# Patient Record
Sex: Male | Born: 1955 | Race: White | Hispanic: No | Marital: Married | Smoking: Former smoker
Health system: Southern US, Community
[De-identification: ages and names within clinical notes are randomized; demographics above are authoritative.]

## PROBLEM LIST (undated history)

## (undated) DIAGNOSIS — I10 Essential (primary) hypertension: Secondary | ICD-10-CM

## (undated) DIAGNOSIS — I639 Cerebral infarction, unspecified: Secondary | ICD-10-CM

## (undated) DIAGNOSIS — E7800 Pure hypercholesterolemia, unspecified: Secondary | ICD-10-CM

## (undated) DIAGNOSIS — D759 Disease of blood and blood-forming organs, unspecified: Secondary | ICD-10-CM

## (undated) HISTORY — PX: ELBOW SURGERY: SHX618

---

## 2016-07-14 ENCOUNTER — Encounter: Payer: Self-pay | Admitting: Emergency Medicine

## 2016-07-14 ENCOUNTER — Emergency Department: Payer: 59

## 2016-07-14 ENCOUNTER — Inpatient Hospital Stay
Admission: EM | Admit: 2016-07-14 | Discharge: 2016-07-16 | DRG: 065 | Disposition: A | Discharge status: Rehabilitation Facility | Payer: 59 | Attending: Internal Medicine | Admitting: Internal Medicine

## 2016-07-14 DIAGNOSIS — R531 Weakness: Secondary | ICD-10-CM

## 2016-07-14 DIAGNOSIS — G458 Other transient cerebral ischemic attacks and related syndromes: Secondary | ICD-10-CM

## 2016-07-14 DIAGNOSIS — I639 Cerebral infarction, unspecified: Principal | ICD-10-CM | POA: Diagnosis present

## 2016-07-14 DIAGNOSIS — R208 Other disturbances of skin sensation: Secondary | ICD-10-CM | POA: Diagnosis not present

## 2016-07-14 DIAGNOSIS — Z823 Family history of stroke: Secondary | ICD-10-CM

## 2016-07-14 DIAGNOSIS — I63311 Cerebral infarction due to thrombosis of right middle cerebral artery: Secondary | ICD-10-CM | POA: Diagnosis not present

## 2016-07-14 DIAGNOSIS — I10 Essential (primary) hypertension: Secondary | ICD-10-CM

## 2016-07-14 DIAGNOSIS — R200 Anesthesia of skin: Secondary | ICD-10-CM

## 2016-07-14 DIAGNOSIS — Z87891 Personal history of nicotine dependence: Secondary | ICD-10-CM

## 2016-07-14 DIAGNOSIS — E785 Hyperlipidemia, unspecified: Secondary | ICD-10-CM | POA: Diagnosis present

## 2016-07-14 DIAGNOSIS — K219 Gastro-esophageal reflux disease without esophagitis: Secondary | ICD-10-CM | POA: Diagnosis present

## 2016-07-14 DIAGNOSIS — Z888 Allergy status to other drugs, medicaments and biological substances status: Secondary | ICD-10-CM

## 2016-07-14 DIAGNOSIS — Z7982 Long term (current) use of aspirin: Secondary | ICD-10-CM

## 2016-07-14 DIAGNOSIS — G459 Transient cerebral ischemic attack, unspecified: Secondary | ICD-10-CM

## 2016-07-14 DIAGNOSIS — G8194 Hemiplegia, unspecified affecting left nondominant side: Secondary | ICD-10-CM | POA: Diagnosis present

## 2016-07-14 DIAGNOSIS — R29810 Facial weakness: Secondary | ICD-10-CM | POA: Diagnosis present

## 2016-07-14 DIAGNOSIS — Z79899 Other long term (current) drug therapy: Secondary | ICD-10-CM

## 2016-07-14 HISTORY — DX: Disease of blood and blood-forming organs, unspecified: D75.9

## 2016-07-14 HISTORY — DX: Essential (primary) hypertension: I10

## 2016-07-14 LAB — CBC WITH DIFFERENTIAL/PLATELET
BASOS ABS: 0.1 10*3/uL (ref 0–0.1)
BASOS PCT: 1 %
EOS ABS: 0.2 10*3/uL (ref 0–0.7)
EOS PCT: 4 %
HEMATOCRIT: 42.1 % (ref 40.0–52.0)
Hemoglobin: 14.7 g/dL (ref 13.0–18.0)
Lymphocytes Relative: 33 %
Lymphs Abs: 2 10*3/uL (ref 1.0–3.6)
MCH: 30.4 pg (ref 26.0–34.0)
MCHC: 34.9 g/dL (ref 32.0–36.0)
MCV: 87.2 fL (ref 80.0–100.0)
MONO ABS: 0.5 10*3/uL (ref 0.2–1.0)
MONOS PCT: 8 %
NEUTROS ABS: 3.2 10*3/uL (ref 1.4–6.5)
Neutrophils Relative %: 54 %
PLATELETS: 233 10*3/uL (ref 150–440)
RBC: 4.82 MIL/uL (ref 4.40–5.90)
RDW: 13.6 % (ref 11.5–14.5)
WBC: 5.9 10*3/uL (ref 3.8–10.6)

## 2016-07-14 LAB — URINALYSIS COMPLETE WITH MICROSCOPIC (ARMC ONLY)
BILIRUBIN URINE: NEGATIVE
Bacteria, UA: NONE SEEN
Glucose, UA: NEGATIVE mg/dL
Hgb urine dipstick: NEGATIVE
KETONES UR: NEGATIVE mg/dL
LEUKOCYTES UA: NEGATIVE
NITRITE: NEGATIVE
PH: 6 (ref 5.0–8.0)
Protein, ur: NEGATIVE mg/dL
SQUAMOUS EPITHELIAL / LPF: NONE SEEN
Specific Gravity, Urine: 1.009 (ref 1.005–1.030)

## 2016-07-14 LAB — COMPREHENSIVE METABOLIC PANEL
ALBUMIN: 4.5 g/dL (ref 3.5–5.0)
ALT: 23 U/L (ref 17–63)
ANION GAP: 3 — AB (ref 5–15)
AST: 29 U/L (ref 15–41)
Alkaline Phosphatase: 56 U/L (ref 38–126)
BILIRUBIN TOTAL: 0.6 mg/dL (ref 0.3–1.2)
BUN: 18 mg/dL (ref 6–20)
CHLORIDE: 107 mmol/L (ref 101–111)
CO2: 26 mmol/L (ref 22–32)
Calcium: 9.5 mg/dL (ref 8.9–10.3)
Creatinine, Ser: 0.9 mg/dL (ref 0.61–1.24)
GFR calc Af Amer: >60 mL/min (ref >60)
GFR calc non Af Amer: >60 mL/min (ref >60)
GLUCOSE: 101 mg/dL — AB (ref 65–99)
POTASSIUM: 4.3 mmol/L (ref 3.5–5.1)
SODIUM: 136 mmol/L (ref 135–145)
TOTAL PROTEIN: 8.2 g/dL — AB (ref 6.5–8.1)

## 2016-07-14 LAB — TROPONIN I: Troponin I: <0.03 ng/mL (ref <0.03)

## 2016-07-14 MED ORDER — ONDANSETRON HCL 4 MG/2ML IJ SOLN: 4.0000 mg | Freq: Four times a day (QID) | INTRAMUSCULAR | Status: DC | PRN

## 2016-07-14 MED ORDER — DOCUSATE SODIUM 100 MG PO CAPS
100.0000 mg | ORAL_CAPSULE | Freq: Two times a day (BID) | ORAL | Status: DC
  Administered 2016-07-15 – 2016-07-16 (×2): 100 mg via ORAL
  Filled 2016-07-14 (×4): qty 1

## 2016-07-14 MED ORDER — SODIUM CHLORIDE 0.9% FLUSH
3.0000 mL | Freq: Two times a day (BID) | INTRAVENOUS | Status: DC
  Administered 2016-07-14 – 2016-07-16 (×3): 3 mL via INTRAVENOUS

## 2016-07-14 MED ORDER — SODIUM CHLORIDE 0.9 % IV SOLN
INTRAVENOUS | Status: DC
  Administered 2016-07-14 – 2016-07-15 (×2): via INTRAVENOUS

## 2016-07-14 MED ORDER — ASPIRIN 81 MG PO CHEW
324.0000 mg | CHEWABLE_TABLET | Freq: Once | ORAL | Status: AC
  Administered 2016-07-14: 324 mg via ORAL
  Filled 2016-07-14: qty 4

## 2016-07-14 MED ORDER — ONDANSETRON HCL 4 MG PO TABS: 4.0000 mg | ORAL_TABLET | Freq: Four times a day (QID) | ORAL | Status: DC | PRN

## 2016-07-14 MED ORDER — HYDROCODONE-ACETAMINOPHEN 5-325 MG PO TABS: 1.0000 | ORAL_TABLET | ORAL | Status: DC | PRN

## 2016-07-14 MED ORDER — AMLODIPINE BESYLATE 5 MG PO TABS
2.5000 mg | ORAL_TABLET | Freq: Every evening | ORAL | Status: DC
  Administered 2016-07-14 – 2016-07-15 (×2): 2.5 mg via ORAL
  Filled 2016-07-14 (×2): qty 1

## 2016-07-14 MED ORDER — VITAMIN D3 25 MCG (1000 UNIT) PO TABS
1000.0000 [IU] | ORAL_TABLET | ORAL | Status: DC
  Administered 2016-07-15 – 2016-07-16 (×2): 1000 [IU] via ORAL
  Filled 2016-07-14 (×4): qty 1

## 2016-07-14 MED ORDER — ACETAMINOPHEN 325 MG PO TABS
650.0000 mg | ORAL_TABLET | Freq: Four times a day (QID) | ORAL | Status: DC | PRN
Start: 2016-07-14 — End: 2016-07-16
  Administered 2016-07-16: 650 mg via ORAL
  Filled 2016-07-14: qty 2

## 2016-07-14 MED ORDER — PANTOPRAZOLE SODIUM 40 MG PO TBEC
40.0000 mg | DELAYED_RELEASE_TABLET | Freq: Every day | ORAL | Status: DC
  Administered 2016-07-16: 40 mg via ORAL
  Filled 2016-07-14 (×3): qty 1

## 2016-07-14 MED ORDER — CLOPIDOGREL BISULFATE 75 MG PO TABS
75.0000 mg | ORAL_TABLET | Freq: Every day | ORAL | Status: DC
  Administered 2016-07-14 – 2016-07-16 (×3): 75 mg via ORAL
  Filled 2016-07-14 (×3): qty 1

## 2016-07-14 MED ORDER — ASPIRIN EC 81 MG PO TBEC
81.0000 mg | DELAYED_RELEASE_TABLET | Freq: Every evening | ORAL | Status: DC
  Administered 2016-07-15: 81 mg via ORAL
  Filled 2016-07-14: qty 1

## 2016-07-14 MED ORDER — BISACODYL 10 MG RE SUPP: 10.0000 mg | Freq: Every day | RECTAL | Status: DC | PRN

## 2016-07-14 MED ORDER — ACETAMINOPHEN 650 MG RE SUPP: 650.0000 mg | Freq: Four times a day (QID) | RECTAL | Status: DC | PRN

## 2016-07-14 MED ORDER — SODIUM CHLORIDE 0.9 % IV SOLN
1000.0000 mL | Freq: Once | INTRAVENOUS | Status: AC
  Administered 2016-07-14: 1000 mL via INTRAVENOUS

## 2016-07-14 MED ORDER — HEPARIN SODIUM (PORCINE) 5000 UNIT/ML IJ SOLN
5000.0000 [IU] | Freq: Three times a day (TID) | INTRAMUSCULAR | Status: DC
Start: 2016-07-14 — End: 2016-07-15
  Administered 2016-07-14 – 2016-07-15 (×2): 5000 [IU] via SUBCUTANEOUS
  Filled 2016-07-14 (×4): qty 1

## 2016-07-14 NOTE — ED Triage Notes (Signed)
C/O feeling generally weak yesterday and then last night left side became more weak than right around 2000.  When patient woke up this morning about 45 minutes ago, wife noticed left facial droop and patient fell when bending over.

## 2016-07-14 NOTE — Consult Note (Signed)
Referring Physician: Dr Doy Hutching     Chief Complaint: L sided numbness   HPI: Jason Oconnor is an 60 y.o. male with a past medical history significant for HTN admitted with 1 day history of left-sided weakness and left facial numbness. Came to ER where BP was elevated but head CT unremarkable.   Symptoms much improved and pt is close to baseline.   On ASA 81 at home  Date last known well: Date: 07/13/2016 Time last known well: Unable to determine tPA Given: No: 1 day out of symptoms and current NIHSS is 1  Past Medical History:  Diagnosis Date  . Bone marrow disease   . Hypertension     History reviewed. No pertinent surgical history.  History reviewed. No pertinent family history. Social History:  reports that he has quit smoking. He has never used smokeless tobacco. He reports that he drinks alcohol. He reports that he does not use drugs.  Allergies:  Allergies  Allergen Reactions  . Lipitor [Atorvastatin] Other (See Comments)    Blood clots.  . Lisinopril     Other reaction(s): Dizziness  . Losartan     Other reaction(s): Dizziness    Medications: I have reviewed the patient's current medications.  ROS: History obtained from the patient  General ROS: negative for - chills, fatigue, fever, night sweats, weight gain or weight loss Psychological ROS: negative for - behavioral disorder, hallucinations, memory difficulties, mood swings or suicidal ideation Ophthalmic ROS: negative for - blurry vision, double vision, eye pain or loss of vision ENT ROS: negative for - epistaxis, nasal discharge, oral lesions, sore throat, tinnitus or vertigo Allergy and Immunology ROS: negative for - hives or itchy/watery eyes Hematological and Lymphatic ROS: negative for - bleeding problems, bruising or swollen lymph nodes Endocrine ROS: negative for - galactorrhea, hair pattern changes, polydipsia/polyuria or temperature intolerance Respiratory ROS: negative for - cough, hemoptysis, shortness of  breath or wheezing Cardiovascular ROS: negative for - chest pain, dyspnea on exertion, edema or irregular heartbeat Gastrointestinal ROS: negative for - abdominal pain, diarrhea, hematemesis, nausea/vomiting or stool incontinence Genito-Urinary ROS: negative for - dysuria, hematuria, incontinence or urinary frequency/urgency Musculoskeletal ROS: negative for - joint swelling or muscular weakness Neurological ROS: as noted in HPI Dermatological ROS: negative for rash and skin lesion changes  Physical Examination: Blood pressure (!) 137/92, pulse 76, temperature 98.7 F (37.1 C), temperature source Oral, resp. rate 20, height 6' (1.829 m), weight 101.2 kg (223 lb), SpO2 98 %.  HEENT-  Normocephalic, no lesions, without obvious abnormality.  Normal external eye and conjunctiva.  Normal TM's bilaterally.  Normal auditory canals and external ears. Normal external nose, mucus membranes and septum.  Normal pharynx. Cardiovascular- regular rate and rhythm, S1, S2 normal, no murmur, click, rub or gallop, pulses palpable throughout   Lungs- Heart exam - S1, S2 normal, no murmur, no gallop, rate regular Abdomen- soft, non-tender; bowel sounds normal; no masses,  no organomegaly Extremities- less then 2 second capillary refill Lymph-no adenopathy palpable Musculoskeletal-no joint tenderness, deformity or swelling Skin-warm and dry, no hyperpigmentation, vitiligo, or suspicious lesions  Neurological Examination Mental Status: Alert, oriented, thought content appropriate.  Speech fluent without evidence of aphasia.  Able to follow 3 step commands without difficulty. Cranial Nerves: II: Discs flat bilaterally; Visual fields grossly normal, pupils equal, round, reactive to light and accommodation III,IV, VI: ptosis not present, extra-ocular motions intact bilaterally V,VII: smile symmetric, facial light touch sensation normal bilaterally VIII: hearing normal bilaterally IX,X: gag reflex present XI:  bilateral shoulder shrug XII: midline tongue extension Motor: Right : Upper extremity   5/5    Left:     Upper extremity   5/5  Lower extremity   5/5     Lower extremity   5/5 Tone and bulk:normal tone throughout; no atrophy noted Sensory: Pinprick and light touch intact throughout, bilaterally Deep Tendon Reflexes: 2+ and symmetric throughout Plantars: Right: downgoing   Left: downgoing Cerebellar: normal finger-to-nose, normal rapid alternating movements and normal heel-to-shin test Gait: not tested       Laboratory Studies:  Basic Metabolic Panel:  Recent Labs Lab 07/14/16 0935  NA 136  K 4.3  CL 107  CO2 26  GLUCOSE 101*  BUN 18  CREATININE 0.90  CALCIUM 9.5    Liver Function Tests:  Recent Labs Lab 07/14/16 0935  AST 29  ALT 23  ALKPHOS 56  BILITOT 0.6  PROT 8.2*  ALBUMIN 4.5   No results for input(s): LIPASE, AMYLASE in the last 168 hours. No results for input(s): AMMONIA in the last 168 hours.  CBC:  Recent Labs Lab 07/14/16 0935  WBC 5.9  NEUTROABS 3.2  HGB 14.7  HCT 42.1  MCV 87.2  PLT 233    Cardiac Enzymes:  Recent Labs Lab 07/14/16 0935  TROPONINI <0.03    BNP: Invalid input(s): POCBNP  CBG: No results for input(s): GLUCAP in the last 168 hours.  Microbiology: No results found for this or any previous visit.  Coagulation Studies: No results for input(s): LABPROT, INR in the last 72 hours.  Urinalysis:  Recent Labs Lab 07/14/16 1220  COLORURINE STRAW*  LABSPEC 1.009  PHURINE 6.0  GLUCOSEU NEGATIVE  HGBUR NEGATIVE  BILIRUBINUR NEGATIVE  KETONESUR NEGATIVE  PROTEINUR NEGATIVE  NITRITE NEGATIVE  LEUKOCYTESUR NEGATIVE    Lipid Panel: No results found for: CHOL, TRIG, HDL, CHOLHDL, VLDL, LDLCALC  HgbA1C: No results found for: HGBA1C  Urine Drug Screen:  No results found for: LABOPIA, COCAINSCRNUR, LABBENZ, AMPHETMU, THCU, LABBARB  Alcohol Level: No results for input(s): ETH in the last 168 hours.  Other  results: EKG: normal EKG, normal sinus rhythm, unchanged from previous tracings.  Imaging: Ct Head Wo Contrast  Result Date: 07/14/2016 CLINICAL DATA:  Left-sided weakness and left-sided facial droop EXAM: CT HEAD WITHOUT CONTRAST TECHNIQUE: Contiguous axial images were obtained from the base of the skull through the vertex without intravenous contrast. COMPARISON:  None. FINDINGS: Brain: The ventricles are normal in size and configuration. There is no intracranial mass, hemorrhage, extra-axial fluid collection, or midline shift. No acute infarct is evident. Gray-white compartments appear within normal limits. Vascular: There is no hyperdense vessel. There is a small focus of calcification in the right carotid siphon. Skull: The bony calvarium appears intact. Sinuses/Orbits: Visualized paranasal sinuses are clear. There is slight rightward deviation the nasal septum. Orbits appear symmetric bilaterally. Other: Mastoid air cells are clear. IMPRESSION: No intracranial mass, hemorrhage, or focal gray - white compartment lesion. Minimal calcification in the right carotid siphon. Rightward deviation of the nasal septum. Electronically Signed   By: Lowella Grip III M.D.   On: 07/14/2016 09:57   US Carotid Bilateral  Result Date: 07/14/2016 CLINICAL DATA:  Left-sided weakness and facial droop EXAM: BILATERAL CAROTID DUPLEX ULTRASOUND TECHNIQUE: Pearline Cables scale imaging, color Doppler and duplex ultrasound were performed of bilateral carotid and vertebral arteries in the neck. COMPARISON:  None. FINDINGS: Criteria: Quantification of carotid stenosis is based on velocity parameters that correlate the residual internal carotid diameter with NASCET-based stenosis  levels, using the diameter of the distal internal carotid lumen as the denominator for stenosis measurement. The following peak systolic velocity measurements were obtained: RIGHT ICA:  56 cm/sec CCA:  96 cm/sec SYSTOLIC ICA/CCA RATIO:  0.6 DIASTOLIC ICA/CCA  RATIO:  0.8 ECA:  68 cm/sec LEFT ICA:  60 cm/sec CCA:  83 cm/sec SYSTOLIC ICA/CCA RATIO:  0.7 DIASTOLIC ICA/CCA RATIO:  1.0 ECA:  78 cm/sec RIGHT CAROTID ARTERY: Real-time interrogation of the right carotid distribution reveals mild generalized intimal thickening without appreciable vessel narrowing. RIGHT VERTEBRAL ARTERY: Flow in the right vertebral artery is antegrade. LEFT CAROTID ARTERY: Real-time interrogation of the left carotid distribution shows mild generalized intimal thickening. There is mild plaque formation in the mid the distal common carotid artery causing approximately 20% diameter stenosis. There is rather minimal plaque in the bifurcation region on the left. No stenosis approaching 50% diameter is seen. LEFT VERTEBRAL ARTERY: Flow in the left vertebral artery is antegrade. IMPRESSION: Mild plaque on the left. No evidence by either real-time interrogation or waveform criteria of hemodynamically significant obstructive disease in either carotid distribution. Flow in both vertebral arteries is in the anatomic direction. Electronically Signed   By: Lowella Grip III M.D.   On: 07/14/2016 11:59    Assessment: 60 y.o. male male with a past medical history significant for HTN admitted with 1 day history of left-sided weakness and left facial numbness. Came to ER where BP was elevated but head CT unremarkable.   Symptoms much improved and pt is close to baseline.   On ASA 81 at home   Stroke Risk Factors - hypertension  Plan: 1. HgbA1c, fasting lipid panel 2. MRI, MRA  of the brain without contrast 3. PT consult, OT consult, Speech consult 4. Echocardiogram 5. Carotid dopplers 6. Prophylactic therapy-Antiplatelet med: Aspirin - dose 325 daily  7. NPO until RN stroke swallow screen 8. Telemetry monitoring 9. Frequent neuro checks   Leotis Pain  07/14/2016, 3:49 PM

## 2016-07-14 NOTE — ED Notes (Signed)
Informed RN bed ready  1133

## 2016-07-14 NOTE — ED Provider Notes (Signed)
[facility] Emergency Department Provider Note        Time seen: ----------------------------------------- 9:32 AM on 07/14/2016 -----------------------------------------    I have reviewed the triage vital signs and the nursing notes.   HISTORY  Chief Complaint Weakness    HPI Jason Oconnor is a 60 y.o. male who presents to ER for weakness since yesterday evening. Patient states he been complaining of generally being weak and then noticed left-sided weakness last night starting around 2000 hrs. The patient woke up this morning about 45 minutes ago his wife notes he had left facial droop and he fell when bending over. He is complaining of left foot weakness, left facial paresthesias.   Past Medical History:  Diagnosis Date  . Bone marrow disease   . Hypertension     There are no active problems to display for this patient.   No past surgical history on file.  Allergies Review of patient's allergies indicates no known allergies.  Social History Social History  Substance Use Topics  . Smoking status: Never Smoker  . Smokeless tobacco: Never Used  . Alcohol use Yes     Comment: rare    Review of Systems Constitutional: Negative for fever. Cardiovascular: Negative for chest pain. Respiratory: Negative for shortness of breath. Gastrointestinal: Negative for abdominal pain, vomiting and diarrhea. Genitourinary: Negative for dysuria. Musculoskeletal: Negative for back pain. Skin: Negative for rash. Neurological: Negative for headaches, Positive for weakness, numbness  10-point ROS otherwise negative.  ____________________________________________   PHYSICAL EXAM:  VITAL SIGNS: ED Triage Vitals  Enc Vitals Group     BP 07/14/16 0907 (!) 170/106     Pulse Rate 07/14/16 0907 81     Resp 07/14/16 0907 18     Temp 07/14/16 0907 98.5 F (36.9 C)     Temp Source 07/14/16 0907 Oral     SpO2 07/14/16 0907 97 %     Weight 07/14/16 0910 223  lb (101.2 kg)     Height 07/14/16 0910 6' (1.829 m)     Head Circumference --      Peak Flow --      Pain Score 07/14/16 0910 0     Pain Loc --      Pain Edu? --      Excl. in [place]? --     Constitutional: Alert and oriented. Well appearing and in no distress. Eyes: Conjunctivae are normal. PERRL. Normal extraocular movements. ENT   Head: Normocephalic and atraumatic.   Nose: No congestion/rhinnorhea.   Mouth/Throat: Mucous membranes are moist.Uvula is midline   Neck: No stridor. Cardiovascular: Normal rate, regular rhythm. No murmurs, rubs, or gallops. Respiratory: Normal respiratory effort without tachypnea nor retractions. Breath sounds are clear and equal bilaterally. No wheezes/rales/rhonchi. Gastrointestinal: Soft and nontender. Normal bowel sounds Musculoskeletal: Nontender with normal range of motion in all extremities. No lower extremity tenderness nor edema. Neurologic:  Normal speech and language. Patient with left-sided facial drooping noted, forehead is spared. Bilateral upper extremity strength is normal, there is left foot weakness compared to the right, normal sensation except for left facial decrease in sensation. Skin:  Skin is warm, dry and intact. No rash noted. Psychiatric: Mood and affect are normal. Speech and behavior are normal.  ____________________________________________  EKG: Interpreted by me. Sinus rhythm with a rate of 60 bpm, normal PR interval, normal QRS, normal QT interval. Left axis deviation  ____________________________________________  ED COURSE:  Pertinent labs & imaging results that were available during my care of the  patient were reviewed by me and considered in my medical decision making (see chart for details). Clinical Course  Patient presents the ER with left-sided weakness. Unsure of time of onset. Symptoms seem to start at least 12 hours ago. We will obtain CT imaging and  reevaluate.  Procedures ____________________________________________   LABS (pertinent positives/negatives)  Labs Reviewed  CBC WITH DIFFERENTIAL/PLATELET  COMPREHENSIVE METABOLIC PANEL  TROPONIN I  URINALYSIS COMPLETEWITH MICROSCOPIC ([facility] ONLY)   CRITICAL CARE Performed by: Earleen Newport   Total critical care time: 30 minutes  Critical care time was exclusive of separately billable procedures and treating other patients.  Critical care was necessary to treat or prevent imminent or life-threatening deterioration.  Critical care was time spent personally by me on the following activities: development of treatment plan with patient and/or surrogate as well as nursing, discussions with consultants, evaluation of patient's response to treatment, examination of patient, obtaining history from patient or surrogate, ordering and performing treatments and interventions, ordering and review of laboratory studies, ordering and review of radiographic studies, pulse oximetry and re-evaluation of patient's condition.  RADIOLOGY CT of the head IMPRESSION: No intracranial mass, hemorrhage, or focal gray - white compartment lesion.  Minimal calcification in the right carotid siphon.  Rightward deviation of the nasal septum.   ____________________________________________  FINAL ASSESSMENT AND PLAN  CVA  Plan: Patient with labs and imaging as dictated above. Patient clinically with subacute right sided infarct affecting his left. I have given him an adult aspirin, we will order an MRI. Patient has agreed to admission.   Earleen Newport, MD   Note: This dictation was prepared with Dragon dictation. Any transcriptional errors that result from this process are unintentional    Earleen Newport, MD 07/14/16 1034

## 2016-07-14 NOTE — H&P (Signed)
History and Physical    Jason Oconnor [id number] DOB: Oct 16, 1956 DOA: 07/14/2016  Referring physician: Dr. Jimmye Norman PCP: Andi Devon, MD  Specialists: none  Chief Complaint: left-sided weakness  HPI: Jason Oconnor is a 60 y.o. male has a past medical history significant for HTN and "bone marrow disease" now with 12-15 hour hx of left-sided weakness and left facial numbness. Came to ER where BP was elevated but head CT and labs non-diagnostic. Sx's are improving. He is now admitted. Denies smoking, DM, or elevated lipids. No cardiac hx.  Review of Systems: The patient denies anorexia, fever, weight loss,, vision loss, decreased hearing, hoarseness, chest pain, syncope, dyspnea on exertion, peripheral edema, balance deficits, hemoptysis, abdominal pain, melena, hematochezia, severe indigestion/heartburn, hematuria, incontinence, genital sores, muscle weakness, suspicious skin lesions, transient blindness,  depression, unusual weight change, abnormal bleeding, enlarged lymph nodes, angioedema, and breast masses.   Past Medical History:  Diagnosis Date  . Bone marrow disease   . Hypertension    History reviewed. No pertinent surgical history. Social History:  reports that he has never smoked. He has never used smokeless tobacco. He reports that he drinks alcohol. He reports that he does not use drugs.  Allergies  Allergen Reactions  . Lisinopril     Other reaction(s): Dizziness  . Losartan     Other reaction(s): Dizziness    History reviewed. No pertinent family history.  Prior to Admission medications   Medication Sig Start Date End Date Taking? Authorizing Provider  amLODipine (NORVASC) 2.5 MG tablet Take 2.5 mg by mouth every evening. 06/24/16  Yes Historical Provider, MD  aspirin EC 81 MG tablet Take 81 mg by mouth every evening.   Yes Historical Provider, MD  Cholecalciferol (VITAMIN D3) 1000 units CAPS Take 1,000 Units by mouth every morning.   Yes Historical Provider,  MD  fluocinonide (LIDEX) 0.05 % external solution Apply 1 application topically 2 (two) times daily as needed. For eczema. 10/24/15 10/23/16 Yes Historical Provider, MD  omeprazole (PRILOSEC) 20 MG capsule Take 20 mg by mouth daily as needed for heartburn or indigestion. 08/23/15  Yes Historical Provider, MD   Physical Exam: Vitals:   07/14/16 0907 07/14/16 0910 07/14/16 0944 07/14/16 1030  BP: (!) 170/106   (!) 153/95  Pulse: 81   75  Resp: 18   13  Temp: 98.5 F (36.9 C)  98.5 F (36.9 C)   TempSrc: Oral     SpO2: 97%   97%  Weight:  101.2 kg (223 lb)    Height:  6' (1.829 m)       General:  No apparent distress, WDWN, [place]/AT  Eyes: PERRL, EOMI, no scleral icterus, conjunctiva clear  ENT: moist oropharynx without exudate, TM's benign, dentition good  Neck: supple, no lymphadenopathy. No bruits or thyromegaly  Cardiovascular: regular rate without MRG; 2+ peripheral pulses, no JVD, no peripheral edema  Respiratory: CTA biL, good air movement without wheezing, rhonchi or crackled. Respiratory effort normal  Abdomen: soft, non tender to palpation, positive bowel sounds, no guarding, no rebound  Skin: no rashes or lesions  Musculoskeletal: normal bulk and tone, no joint swelling  Psychiatric: normal mood and affect, A&OX3  Neurologic: CN 2-12 grossly intact, Motor strength 5/5 in all 4 groups with symmetric DTR's and non-focal sensory exam  Labs on Admission:  Basic Metabolic Panel:  Recent Labs Lab 07/14/16 0935  NA 136  K 4.3  CL 107  CO2 26  GLUCOSE 101*  BUN 18  CREATININE 0.90  CALCIUM 9.5   Liver Function Tests:  Recent Labs Lab 07/14/16 0935  AST 29  ALT 23  ALKPHOS 56  BILITOT 0.6  PROT 8.2*  ALBUMIN 4.5   No results for input(s): LIPASE, AMYLASE in the last 168 hours. No results for input(s): AMMONIA in the last 168 hours. CBC:  Recent Labs Lab 07/14/16 0935  WBC 5.9  NEUTROABS 3.2  HGB 14.7  HCT 42.1  MCV 87.2  PLT 233   Cardiac  Enzymes:  Recent Labs Lab 07/14/16 0935  TROPONINI <0.03    BNP (last 3 results) No results for input(s): BNP in the last 8760 hours.  ProBNP (last 3 results) No results for input(s): PROBNP in the last 8760 hours.  CBG: No results for input(s): GLUCAP in the last 168 hours.  Radiological Exams on Admission: Ct Head Wo Contrast  Result Date: 07/14/2016 CLINICAL DATA:  Left-sided weakness and left-sided facial droop EXAM: CT HEAD WITHOUT CONTRAST TECHNIQUE: Contiguous axial images were obtained from the base of the skull through the vertex without intravenous contrast. COMPARISON:  None. FINDINGS: Brain: The ventricles are normal in size and configuration. There is no intracranial mass, hemorrhage, extra-axial fluid collection, or midline shift. No acute infarct is evident. Gray-white compartments appear within normal limits. Vascular: There is no hyperdense vessel. There is a small focus of calcification in the right carotid siphon. Skull: The bony calvarium appears intact. Sinuses/Orbits: Visualized paranasal sinuses are clear. There is slight rightward deviation the nasal septum. Orbits appear symmetric bilaterally. Other: Mastoid air cells are clear. IMPRESSION: No intracranial mass, hemorrhage, or focal gray - white compartment lesion. Minimal calcification in the right carotid siphon. Rightward deviation of the nasal septum. Electronically Signed   By: Lowella Grip III M.D.   On: 07/14/2016 09:57    EKG: Independently reviewed.  Assessment/Plan Principal Problem:   TIA (transient ischemic attack) Active Problems:   HTN, goal below 140/80   Left facial numbness   Weakness of left side of body   Will observe on telemetry with SQ Heparin and Plavix. Consult Neuro. Check brain MRI with carotids and echo. Monitor BP closely. Repeat labs in AM. PT to evaluate.  Diet: soft Fluids: NS@75  DVT Prophylaxis: SQ Heparin  Code Status: FULL  Family Communication: yes  Disposition  Plan: home  Time spent: 50 min

## 2016-07-14 NOTE — Plan of Care (Signed)
Problem: Tissue Perfusion: Goal: Cerebral tissue perfusion will improve applicable to all stroke diagnoses  Outcome: Progressing Pt admitted today from the ED. NIH 4. No neuro changes during the shift. Denies pain.

## 2016-07-15 ENCOUNTER — Observation Stay
Admit: 2016-07-15 | Discharge: 2016-07-15 | Disposition: A | Discharge status: Home / Self Care | Payer: 59 | Attending: Internal Medicine | Admitting: Internal Medicine

## 2016-07-15 ENCOUNTER — Inpatient Hospital Stay: Payer: 59

## 2016-07-15 ENCOUNTER — Encounter: Payer: Self-pay | Admitting: Radiology

## 2016-07-15 ENCOUNTER — Observation Stay: Payer: 59

## 2016-07-15 DIAGNOSIS — E785 Hyperlipidemia, unspecified: Secondary | ICD-10-CM | POA: Diagnosis present

## 2016-07-15 DIAGNOSIS — Z87891 Personal history of nicotine dependence: Secondary | ICD-10-CM | POA: Diagnosis not present

## 2016-07-15 DIAGNOSIS — Z7982 Long term (current) use of aspirin: Secondary | ICD-10-CM | POA: Diagnosis not present

## 2016-07-15 DIAGNOSIS — R29810 Facial weakness: Secondary | ICD-10-CM | POA: Diagnosis present

## 2016-07-15 DIAGNOSIS — I10 Essential (primary) hypertension: Secondary | ICD-10-CM | POA: Diagnosis present

## 2016-07-15 DIAGNOSIS — Z823 Family history of stroke: Secondary | ICD-10-CM | POA: Diagnosis not present

## 2016-07-15 DIAGNOSIS — I63311 Cerebral infarction due to thrombosis of right middle cerebral artery: Secondary | ICD-10-CM

## 2016-07-15 DIAGNOSIS — K219 Gastro-esophageal reflux disease without esophagitis: Secondary | ICD-10-CM | POA: Diagnosis present

## 2016-07-15 DIAGNOSIS — Z79899 Other long term (current) drug therapy: Secondary | ICD-10-CM | POA: Diagnosis not present

## 2016-07-15 DIAGNOSIS — Z888 Allergy status to other drugs, medicaments and biological substances status: Secondary | ICD-10-CM | POA: Diagnosis not present

## 2016-07-15 DIAGNOSIS — I639 Cerebral infarction, unspecified: Secondary | ICD-10-CM | POA: Diagnosis present

## 2016-07-15 DIAGNOSIS — G8194 Hemiplegia, unspecified affecting left nondominant side: Secondary | ICD-10-CM | POA: Diagnosis present

## 2016-07-15 LAB — ECHOCARDIOGRAM COMPLETE
Height: 72 in
Weight: 3568 oz

## 2016-07-15 LAB — COMPREHENSIVE METABOLIC PANEL
ALBUMIN: 4 g/dL (ref 3.5–5.0)
ALT: 20 U/L (ref 17–63)
ANION GAP: 6 (ref 5–15)
AST: 22 U/L (ref 15–41)
Alkaline Phosphatase: 49 U/L (ref 38–126)
BILIRUBIN TOTAL: 0.9 mg/dL (ref 0.3–1.2)
BUN: 17 mg/dL (ref 6–20)
CHLORIDE: 109 mmol/L (ref 101–111)
CO2: 23 mmol/L (ref 22–32)
Calcium: 9 mg/dL (ref 8.9–10.3)
Creatinine, Ser: 0.87 mg/dL (ref 0.61–1.24)
GFR calc Af Amer: >60 mL/min (ref >60)
GLUCOSE: 97 mg/dL (ref 65–99)
POTASSIUM: 4 mmol/L (ref 3.5–5.1)
Sodium: 138 mmol/L (ref 135–145)
TOTAL PROTEIN: 7.5 g/dL (ref 6.5–8.1)

## 2016-07-15 LAB — LIPID PANEL
Cholesterol: 205 mg/dL — ABNORMAL HIGH (ref 0–200)
HDL: 39 mg/dL — AB (ref >40)
LDL CALC: 138 mg/dL — AB (ref 0–99)
TRIGLYCERIDES: 139 mg/dL (ref <150)
Total CHOL/HDL Ratio: 5.3 RATIO
VLDL: 28 mg/dL (ref 0–40)

## 2016-07-15 LAB — CBC
HEMATOCRIT: 40.4 % (ref 40.0–52.0)
HEMOGLOBIN: 14 g/dL (ref 13.0–18.0)
MCH: 30.5 pg (ref 26.0–34.0)
MCHC: 34.8 g/dL (ref 32.0–36.0)
MCV: 87.7 fL (ref 80.0–100.0)
Platelets: 214 10*3/uL (ref 150–440)
RBC: 4.6 MIL/uL (ref 4.40–5.90)
RDW: 13.5 % (ref 11.5–14.5)
WBC: 6.4 10*3/uL (ref 3.8–10.6)

## 2016-07-15 MED ORDER — IOPAMIDOL (ISOVUE-370) INJECTION 76%
75.0000 mL | Freq: Once | INTRAVENOUS | Status: AC | PRN
  Administered 2016-07-15: 16:00:00 75 mL via INTRAVENOUS

## 2016-07-15 MED ORDER — ENOXAPARIN SODIUM 40 MG/0.4ML ~~LOC~~ SOLN
40.0000 mg | SUBCUTANEOUS | Status: DC
  Administered 2016-07-15: 40 mg via SUBCUTANEOUS
  Filled 2016-07-15: qty 0.4

## 2016-07-15 NOTE — Evaluation (Signed)
Occupational Therapy Evaluation Patient Details Name: Jason Oconnor MRN: [id number] DOB: 1956/07/25 Today's Date: 07/15/2016    History of Present Illness Pt. is a 60 y.o. male who was admitted to [facility]  with left sided weakness, and facial droop. PMH: include: HTN.   Clinical Impression   Pt. Is a 60 y.o. Male who was admitted to [facility] with left sided weakness, and facial droop. Pt. Presents with LUE weakness, impaired coordination, impaired hand function skills, and decreased functional mobility which hinders his ability to incorporate his LUE and hand into tasks during ADL and IADLs. Pt. Could benefit from skilled OT services for ADL and IADL training, UE there. Ex, neuro-muscular re-ed, and pt./family education in order to return to his PLOF. Pt. And wife were provided with education about using his left hand to assist during ADL tasks, and there. Ex. Pt. Would benefit from follow-up OT services, and would be a good candidate for CIR.    Follow Up Recommendations  CIR    Equipment Recommendations       Recommendations for Other Services Rehab consult     Precautions / Restrictions Precautions Precautions: Fall Restrictions Weight Bearing Restrictions: No                                           ADL Overall ADL's : Needs assistance/impaired Eating/Feeding: Set up (Pt. is able to use his RUE, however requires hand-over hand assist for LUE use.)   Grooming: Set up (Pt. requires hand-over hand assist to use her LUE to assist.)               Lower Body Dressing: Maximal assistance               Functional mobility during ADLs: Minimal assistance General ADL Comments: Pt. has difficulty using his left hand to complete light self care tasks.     Vision     Perception     Praxis      Pertinent Vitals/Pain Pain Assessment: No/denies pain Pain Score: 0-No pain     Hand Dominance     Extremity/Trunk Assessment Upper Extremity  Assessment Upper Extremity Assessment: Generalized weakness (LUE proximal strength 3+/5 for Abduction, flexion, 4-/5 elbow flexion, wrist extension 3/5, thumb flexion/abduction 3-/5, impaired thumb opposition. Grip L: 22#, R: 110#. Intact sensation, proprioceptive awareness.)         Communication Communication Communication: No difficulties   Cognition Arousal/Alertness: Awake/alert Behavior During Therapy: WFL for tasks assessed/performed Overall Cognitive Status: Within Functional Limits for tasks assessed                     General Comments       Exercises   Shoulder Instructions      Home Living Family/patient expects to be discharged to:: Private residence Living Arrangements: Spouse/significant other Available Help at Discharge: Family Type of Home: House Home Access: Stairs to enter Technical brewer of Steps: 1 Entrance Stairs-Rails: None Home Layout: One level           Bathroom Accessibility: Yes   Home Equipment: None          Prior Functioning/Environment Level of Independence: Independent        Comments: Pt was independent with all household and community ambulation. Independent with ADLs.    OT Diagnosis: Generalized weakness   OT Problem List: Decreased strength;Decreased activity tolerance;Decreased coordination;Impaired UE  functional use;Impaired balance (sitting and/or standing);Decreased range of motion   OT Treatment/Interventions:      OT Goals(Current goals can be found in the care plan section) Acute Rehab OT Goals Patient Stated Goal: To return to independence. OT Goal Formulation: With patient Potential to Achieve Goals: Good  OT Frequency:     Barriers to D/C:            Co-evaluation              End of Session    Activity Tolerance: Patient tolerated treatment well Patient left: in chair;with chair alarm set;with family/visitor present   Time: 1120-1150 OT Time Calculation (min): 30  min Charges:  OT General Charges $OT Visit: 1 Procedure OT Evaluation $OT Eval Moderate Complexity: 1 Procedure G-Codes:    Harrel Carina, MS, OTR/L 07/15/2016, 12:17 PM

## 2016-07-15 NOTE — Progress Notes (Signed)
Inpatient Rehabilitation  I received a call from Jeannie Fend, [facility] regarding possible IP Rehab for Mr. Jason Oconnor. PT and OT are recommending IP rehab.   I will reach out to the patient and family to discuss.  Please call if questions.  [place] Admissions Coordinator Cell [phone] Office [phone]

## 2016-07-15 NOTE — Evaluation (Signed)
Physical Therapy Evaluation Patient Details Name: Jason Oconnor MRN: [id number] DOB: 10-Dec-1955 Today's Date: 07/15/2016   History of Present Illness  Pt is a 60 y/o admitted for TIA. Presents to ED with L-sided weakness and facial droop along with fall on 8/20. Head CT unremarkable, awaiting MRI results. PMH includes HTN.  Clinical Impression  Pt is a pleasant and motivated 60 y/o male who presents with generalized weakness and difficulty walking. PLOF: Pt was independent with all household and community ambulation. Independent with ADLs. R UE and LE grossly 5/5. L UE 4-/5, L LE 3+/5. L UE difficulty with [place], secondary to weakness and decreased motor control. Decreased speed and motor coordination on L side with RAMPs and toe taps. Pt requires min assist for bed mobility and transfers and +2 (safety) mod assist for ambulation. Pt relies on R UE and LE for bed mobility and sit/stand transfers. Min assist to lift trunk off of bed with bed mobility due to L UE weakness. Min assist for transfers and verbal cueing for safe technique and to primarily use R LE to increased stability. Unsteadiness noted with sit/stand, min assist for standing balance due to L knee buckling. Significant deficits noted on L side with ambulation with RW; demonstrates L LE foot drag (decreased L hip flexion and L ankle dorsiflexion) and increased L lateral lean (L LE giving way due to weakness), both indicate high risk for falls.. +2 assist for safety due to L LE weakness. Pt will benefit from skilled PT services in order to improve strength, ROM, balance, endurance, functional mobility, and safety with DME use. At this time pt is appropriate for acute inpatient rehab services to address deficits. This entire session was guided, instructed, and directly supervised by Greggory Stallion, DPT.      Follow Up Recommendations CIR    Equipment Recommendations  Rolling walker with 5" wheels    Recommendations for Other Services        Precautions / Restrictions Precautions Precautions: Fall Restrictions Weight Bearing Restrictions: No      Mobility  Bed Mobility Overal bed mobility: Needs Assistance Bed Mobility: Supine to Sit     Supine to sit: Min assist     General bed mobility comments: Pt able to swing LE off of bed. Unable to push through L UE to assist with lifting trunk off of bed due to weakness, min assist. Pt able to scoot to EOB with no assist, primarily using R UE and LE.  Transfers Overall transfer level: Needs assistance Equipment used: None Transfers: Sit to/from Stand Sit to Stand: Min assist         General transfer comment: Min assist for sit/stand, demonstrates safe technique. Unsteadiness noted due to L LE weakness, relies on R LE to maintain standing balance.  Ambulation/Gait Ambulation/Gait assistance: Mod assist;+2 safety/equipment Ambulation Distance (Feet): 3 Feet Assistive device: Rolling walker (2 wheeled) Gait Pattern/deviations: Step-to pattern;Decreased step length - left;Decreased stride length;Decreased dorsiflexion - left   Gait velocity interpretation: Below normal speed for age/gender General Gait Details: Pt requires +2 (for safety) mod assist with ambulation with RW. Decreased L hip flexion and L ankle doriflexion, decreased motor control. Increased L lateral lean due to L LE giving way, mod assist to maintain balance.   Stairs            Wheelchair Mobility    Modified Rankin (Stroke Patients Only)       Balance Overall balance assessment: Needs assistance Sitting-balance support: Feet supported;Bilateral upper extremity supported  Sitting balance-Leahy Scale: Good Sitting balance - Comments: Pt able to maintain sitting balance with B UE support. Min guard with no UE support.   Standing balance support: Bilateral upper extremity supported Standing balance-Leahy Scale: Fair Standing balance comment: Pt requires +2 mod assist for safety to maintain  standing balance due to L LE weakness, giving way easily.                             Pertinent Vitals/Pain Pain Assessment: No/denies pain    Home Living Family/patient expects to be discharged to:: Private residence Living Arrangements: Spouse/significant other Available Help at Discharge: Family Type of Home: House Home Access: Stairs to enter Entrance Stairs-Rails: None Entrance Stairs-Number of Steps: 1 Home Layout: One level Home Equipment: None      Prior Function Level of Independence: Independent         Comments: Pt was independent with all household and community ambulation. Independent with ADLs.     Hand Dominance        Extremity/Trunk Assessment   Upper Extremity Assessment: Generalized weakness (R UE grossly 5/5. L UE grossly 4/5.)           Lower Extremity Assessment: Generalized weakness (R LE grossly 5/5. L LE grossly 3+/5.)         Communication   Communication: No difficulties  Cognition Arousal/Alertness: Awake/alert Behavior During Therapy: WFL for tasks assessed/performed Overall Cognitive Status: Within Functional Limits for tasks assessed                      General Comments      Exercises Other Exercises Other Exercises: Supine ther-ex: L LE quad sets, SLR, and hip abd/add x 10 reps with min assist. Verbal and tactile cueing for proper technique.      Assessment/Plan    PT Assessment Patient needs continued PT services  PT Diagnosis Generalized weakness;Difficulty walking   PT Problem List Decreased strength;Decreased range of motion;Decreased activity tolerance;Decreased balance;Decreased mobility;Decreased coordination;Decreased knowledge of use of DME  PT Treatment Interventions DME instruction;Gait training;Stair training;Functional mobility training;Therapeutic activities;Therapeutic exercise;Balance training;Neuromuscular re-education;Patient/family education   PT Goals (Current goals can be  found in the Care Plan section) Acute Rehab PT Goals Patient Stated Goal: To get back to normal PT Goal Formulation: With patient Time For Goal Achievement: 07/29/16 Potential to Achieve Goals: Good    Frequency 7X/week   Barriers to discharge        Co-evaluation               End of Session Equipment Utilized During Treatment: Gait belt Activity Tolerance: Patient tolerated treatment well Patient left: in chair;with call bell/phone within reach;with chair alarm set;with family/visitor present Nurse Communication: Mobility status    Functional Assessment Tool Used: clinical judgement Functional Limitation: Mobility: Walking and moving around Mobility: Walking and Moving Around Current Status [phone]): At least 40 percent but less than 60 percent impaired, limited or restricted Mobility: Walking and Moving Around Goal Status [phone]): At least 20 percent but less than 40 percent impaired, limited or restricted    Time: [id number] PT Time Calculation (min) (ACUTE ONLY): 25 min   Charges:   PT Evaluation $PT Eval Moderate Complexity: 1 Procedure PT Treatments $Therapeutic Exercise: 8-22 mins   PT G Codes:   PT G-Codes **NOT FOR INPATIENT CLASS** Functional Assessment Tool Used: clinical judgement Functional Limitation: Mobility: Walking and moving around Mobility: Walking and Moving Around Current  Status [phone]): At least 40 percent but less than 60 percent impaired, limited or restricted Mobility: Walking and Moving Around Goal Status [phone]): At least 20 percent but less than 40 percent impaired, limited or restricted    [facility] 07/15/2016, 11:41 AM  Georgina Pillion, SPT [phone]

## 2016-07-15 NOTE — Progress Notes (Signed)
[employer] PROGRESS NOTE  Jason Oconnor [id number] DOB: October 24, 1956 DOA: 07/14/2016 PCP: Andi Devon, MD  HPI/Subjective: Patient with worsening symptoms today than yesterday. He can't move his left foot up or down. He is able to lift his left leg up off the bed though. Incoordination with the left hand and worse in coordination with the left fingers.  Objective: Vitals:   07/15/16 0433 07/15/16 1023  BP: (!) 153/93 (!) 142/89  Pulse: 69 78  Resp: 19 18  Temp: 97.6 F (36.4 C) 98 F (36.7 C)    Filed Weights   07/14/16 0910  Weight: 101.2 kg (223 lb)    ROS: Review of Systems  Constitutional: Negative for chills and fever.  Eyes: Negative for blurred vision.  Respiratory: Negative for cough and shortness of breath.   Cardiovascular: Negative for chest pain.  Gastrointestinal: Negative for abdominal pain, constipation, diarrhea, nausea and vomiting.  Genitourinary: Negative for dysuria.  Musculoskeletal: Negative for joint pain.  Neurological: Positive for focal weakness and weakness. Negative for dizziness and headaches.   Exam: Physical Exam  Constitutional: He is oriented to person, place, and time.  HENT:  Nose: No mucosal edema.  Mouth/Throat: No oropharyngeal exudate or posterior oropharyngeal edema.  Eyes: Conjunctivae, EOM and lids are normal. Pupils are equal, round, and reactive to light.  Neck: No JVD present. Carotid bruit is not present. No edema present. No thyroid mass and no thyromegaly present.  Cardiovascular: S1 normal and S2 normal.  Exam reveals no gallop.   No murmur heard. Pulses:      Dorsalis pedis pulses are 2+ on the right side, and 2+ on the left side.  Respiratory: No respiratory distress. He has no wheezes. He has no rhonchi. He has no rales.  GI: Soft. Bowel sounds are normal. There is no tenderness.  Musculoskeletal:       Right ankle: He exhibits no swelling.       Left ankle: He exhibits no swelling.  Lymphadenopathy:     He has no cervical adenopathy.  Neurological: He is alert and oriented to person, place, and time.  Left foot weakness with unable to extend or flex. Able to lift left leg up off the bed. Incoordination with left arm movements. Unable to do rapid finger movements with left hand. Does have decrease in grip strength on the left hand despite incoordination of movements. Able to lift left arm up overhead.  Skin: Skin is warm. No rash noted. Nails show no clubbing.  Psychiatric: He has a normal mood and affect.      Data Reviewed: Basic Metabolic Panel:  Recent Labs Lab 07/14/16 0935 07/15/16 0542  NA 136 138  K 4.3 4.0  CL 107 109  CO2 26 23  GLUCOSE 101* 97  BUN 18 17  CREATININE 0.90 0.87  CALCIUM 9.5 9.0   Liver Function Tests:  Recent Labs Lab 07/14/16 0935 07/15/16 0542  AST 29 22  ALT 23 20  ALKPHOS 56 49  BILITOT 0.6 0.9  PROT 8.2* 7.5  ALBUMIN 4.5 4.0   CBC:  Recent Labs Lab 07/14/16 0935 07/15/16 0542  WBC 5.9 6.4  NEUTROABS 3.2  --   HGB 14.7 14.0  HCT 42.1 40.4  MCV 87.2 87.7  PLT 233 214   Cardiac Enzymes:  Recent Labs Lab 07/14/16 0935  TROPONINI <0.03     Studies: Ct Head Wo Contrast  Result Date: 07/14/2016 CLINICAL DATA:  Left-sided weakness and left-sided facial droop EXAM: CT HEAD WITHOUT  CONTRAST TECHNIQUE: Contiguous axial images were obtained from the base of the skull through the vertex without intravenous contrast. COMPARISON:  None. FINDINGS: Brain: The ventricles are normal in size and configuration. There is no intracranial mass, hemorrhage, extra-axial fluid collection, or midline shift. No acute infarct is evident. Gray-white compartments appear within normal limits. Vascular: There is no hyperdense vessel. There is a small focus of calcification in the right carotid siphon. Skull: The bony calvarium appears intact. Sinuses/Orbits: Visualized paranasal sinuses are clear. There is slight rightward deviation the nasal septum.  Orbits appear symmetric bilaterally. Other: Mastoid air cells are clear. IMPRESSION: No intracranial mass, hemorrhage, or focal gray - white compartment lesion. Minimal calcification in the right carotid siphon. Rightward deviation of the nasal septum. Electronically Signed   By: Lowella Grip III M.D.   On: 07/14/2016 09:57   Mr Brain Wo Contrast  Result Date: 07/15/2016 CLINICAL DATA:  Left-sided weakness.  Left facial droop EXAM: MRI HEAD WITHOUT CONTRAST TECHNIQUE: Multiplanar, multiecho pulse sequences of the brain and surrounding structures were obtained without intravenous contrast. COMPARISON:  CT head 07/14/2016 FINDINGS: Acute infarct in the right posterior putamen. No other areas of acute infarction Minimal chronic microvascular ischemic changes in the white matter. No cortical infarct. Ventricle size normal.  Cerebral volume normal. Negative for hemorrhage or fluid collection. Negative for mass or edema.  No shift of the midline structures. Circle of Willis patent. Normal orbital structures. Mild mucosal thickening in the paranasal sinuses. Pituitary normal in size IMPRESSION: Acute infarct in the posterior basal ganglia on the right involving the posterior putamen. Electronically Signed   By: Franchot Gallo M.D.   On: 07/15/2016 09:07   US Carotid Bilateral  Result Date: 07/14/2016 CLINICAL DATA:  Left-sided weakness and facial droop EXAM: BILATERAL CAROTID DUPLEX ULTRASOUND TECHNIQUE: Pearline Cables scale imaging, color Doppler and duplex ultrasound were performed of bilateral carotid and vertebral arteries in the neck. COMPARISON:  None. FINDINGS: Criteria: Quantification of carotid stenosis is based on velocity parameters that correlate the residual internal carotid diameter with NASCET-based stenosis levels, using the diameter of the distal internal carotid lumen as the denominator for stenosis measurement. The following peak systolic velocity measurements were obtained: RIGHT ICA:  56 cm/sec CCA:   96 cm/sec SYSTOLIC ICA/CCA RATIO:  0.6 DIASTOLIC ICA/CCA RATIO:  0.8 ECA:  68 cm/sec LEFT ICA:  60 cm/sec CCA:  83 cm/sec SYSTOLIC ICA/CCA RATIO:  0.7 DIASTOLIC ICA/CCA RATIO:  1.0 ECA:  78 cm/sec RIGHT CAROTID ARTERY: Real-time interrogation of the right carotid distribution reveals mild generalized intimal thickening without appreciable vessel narrowing. RIGHT VERTEBRAL ARTERY: Flow in the right vertebral artery is antegrade. LEFT CAROTID ARTERY: Real-time interrogation of the left carotid distribution shows mild generalized intimal thickening. There is mild plaque formation in the mid the distal common carotid artery causing approximately 20% diameter stenosis. There is rather minimal plaque in the bifurcation region on the left. No stenosis approaching 50% diameter is seen. LEFT VERTEBRAL ARTERY: Flow in the left vertebral artery is antegrade. IMPRESSION: Mild plaque on the left. No evidence by either real-time interrogation or waveform criteria of hemodynamically significant obstructive disease in either carotid distribution. Flow in both vertebral arteries is in the anatomic direction. Electronically Signed   By: Lowella Grip III M.D.   On: 07/14/2016 11:59    Scheduled Meds: . amLODipine  2.5 mg Oral QPM  . aspirin EC  81 mg Oral QPM  . cholecalciferol  1,000 Units Oral BH-q7a  . clopidogrel  75  mg Oral Daily  . docusate sodium  100 mg Oral BID  . heparin  5,000 Units Subcutaneous Q8H  . pantoprazole  40 mg Oral Daily  . sodium chloride flush  3 mL Intravenous Q12H    Assessment/Plan:  1. 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 Patient on aspirin and Plavix. Carotid ultrasound and echocardiogram unremarkable. Telemetry monitoring shows normal sinus rhythm. Patient will be a good candidate for acute rehabilitation. 2. Essential hypertension on Norvasc. Allow blood pressure did be a little high. 3. Hyperlipidemia unspecified. Patient has a strange reaction to Lipitor. I will  have a pharmacist look into whether I can give Crestor or not. Patient did have Crestor in the past but not sure why he stopped it. LDL 138. 4. GERD on Protonix 5. History of bone marrow disease  Code Status:     Code Status Orders        Start     Ordered   07/14/16 1204  Full code  Continuous     07/14/16 1203    Code Status History    Date Active Date Inactive Code Status Order ID Comments User Context   07/14/2016 12:04 PM 07/15/2016  7:22 AM Full Code [id number]  Idelle Crouch, MD Inpatient     Family Communication: Family at bedside Disposition Plan: Good candidate for acute rehabilitation.  Consultants:  Neurology  Time spent: 40minutes  [place], [place]

## 2016-07-15 NOTE — Consult Note (Signed)
Referring Physician: Dr Doy Hutching     Chief Complaint: L sided numbness   HPI: Jason Oconnor is an 60 y.o. male with a past medical history significant for HTN admitted with 1 day history of left-sided weakness and left facial numbness. Came to ER where BP was elevated but head CT unremarkable.   Symptoms much improved and pt is close to baseline.   On ASA 81 at home  Date last known well: Date: 07/13/2016 Time last known well: Unable to determine tPA Given: No: 1 day out of symptoms and current NIHSS is 1   Today Pt has increased weakness in his LUE and LLE from yesterday.  MRI consistent with stroke.    Past Medical History:  Diagnosis Date  . Bone marrow disease   . Hypertension     History reviewed. No pertinent surgical history.  History reviewed. No pertinent family history. Social History:  reports that he has quit smoking. He has never used smokeless tobacco. He reports that he drinks alcohol. He reports that he does not use drugs.  Allergies:  Allergies  Allergen Reactions  . Lipitor [Atorvastatin] Other (See Comments)    Blood clots.  . Lisinopril     Other reaction(s): Dizziness  . Losartan     Other reaction(s): Dizziness    Medications: I have reviewed the patient's current medications.  ROS: History obtained from the patient  General ROS: negative for - chills, fatigue, fever, night sweats, weight gain or weight loss Psychological ROS: negative for - behavioral disorder, hallucinations, memory difficulties, mood swings or suicidal ideation Ophthalmic ROS: negative for - blurry vision, double vision, eye pain or loss of vision ENT ROS: negative for - epistaxis, nasal discharge, oral lesions, sore throat, tinnitus or vertigo Allergy and Immunology ROS: negative for - hives or itchy/watery eyes Hematological and Lymphatic ROS: negative for - bleeding problems, bruising or swollen lymph nodes Endocrine ROS: negative for - galactorrhea, hair pattern changes,  polydipsia/polyuria or temperature intolerance Respiratory ROS: negative for - cough, hemoptysis, shortness of breath or wheezing Cardiovascular ROS: negative for - chest pain, dyspnea on exertion, edema or irregular heartbeat Gastrointestinal ROS: negative for - abdominal pain, diarrhea, hematemesis, nausea/vomiting or stool incontinence Genito-Urinary ROS: negative for - dysuria, hematuria, incontinence or urinary frequency/urgency Musculoskeletal ROS: negative for - joint swelling or muscular weakness Neurological ROS: as noted in HPI Dermatological ROS: negative for rash and skin lesion changes  Physical Examination: Blood pressure (!) 142/89, pulse 78, temperature 98 F (36.7 C), temperature source Oral, resp. rate 18, height 6' (1.829 m), weight 101.2 kg (223 lb), SpO2 97 %.  HEENT-  Normocephalic, no lesions, without obvious abnormality.  Normal external eye and conjunctiva.  Normal TM's bilaterally.  Normal auditory canals and external ears. Normal external nose, mucus membranes and septum.  Normal pharynx. Cardiovascular- regular rate and rhythm, S1, S2 normal, no murmur, click, rub or gallop, pulses palpable throughout   Lungs- Heart exam - S1, S2 normal, no murmur, no gallop, rate regular Abdomen- soft, non-tender; bowel sounds normal; no masses,  no organomegaly Extremities- less then 2 second capillary refill Lymph-no adenopathy palpable Musculoskeletal-no joint tenderness, deformity or swelling Skin-warm and dry, no hyperpigmentation, vitiligo, or suspicious lesions  Neurological Examination Mental Status: Alert, oriented, thought content appropriate.  Speech fluent without evidence of aphasia.  Able to follow 3 step commands without difficulty. Cranial Nerves: II: Discs flat bilaterally; Visual fields grossly normal, pupils equal, round, reactive to light and accommodation III,IV, VI: ptosis not present, extra-ocular  motions intact bilaterally V,VII: smile symmetric, facial  light touch sensation normal bilaterally VIII: hearing normal bilaterally IX,X: gag reflex present XI: bilateral shoulder shrug XII: midline tongue extension Motor: Right : Upper extremity   5/5    Left:     Upper extremity   4/5  Lower extremity   5/5     Lower extremity   4/5 Tone and bulk:normal tone throughout; no atrophy noted Sensory: Pinprick and light touch intact throughout, bilaterally Deep Tendon Reflexes: 2+ and symmetric throughout Plantars: Right: downgoing   Left: downgoing Cerebellar: normal finger-to-nose, normal rapid alternating movements and normal heel-to-shin test Gait: not tested       Laboratory Studies:  Basic Metabolic Panel:  Recent Labs Lab 07/14/16 0935 07/15/16 0542  NA 136 138  K 4.3 4.0  CL 107 109  CO2 26 23  GLUCOSE 101* 97  BUN 18 17  CREATININE 0.90 0.87  CALCIUM 9.5 9.0    Liver Function Tests:  Recent Labs Lab 07/14/16 0935 07/15/16 0542  AST 29 22  ALT 23 20  ALKPHOS 56 49  BILITOT 0.6 0.9  PROT 8.2* 7.5  ALBUMIN 4.5 4.0   No results for input(s): LIPASE, AMYLASE in the last 168 hours. No results for input(s): AMMONIA in the last 168 hours.  CBC:  Recent Labs Lab 07/14/16 0935 07/15/16 0542  WBC 5.9 6.4  NEUTROABS 3.2  --   HGB 14.7 14.0  HCT 42.1 40.4  MCV 87.2 87.7  PLT 233 214    Cardiac Enzymes:  Recent Labs Lab 07/14/16 0935  TROPONINI <0.03    BNP: Invalid input(s): POCBNP  CBG: No results for input(s): GLUCAP in the last 168 hours.  Microbiology: No results found for this or any previous visit.  Coagulation Studies: No results for input(s): LABPROT, INR in the last 72 hours.  Urinalysis:   Recent Labs Lab 07/14/16 1220  COLORURINE STRAW*  LABSPEC 1.009  PHURINE 6.0  GLUCOSEU NEGATIVE  HGBUR NEGATIVE  BILIRUBINUR NEGATIVE  KETONESUR NEGATIVE  PROTEINUR NEGATIVE  NITRITE NEGATIVE  LEUKOCYTESUR NEGATIVE    Lipid Panel:    Component Value Date/Time   CHOL 205 (H)  07/15/2016 0542   TRIG 139 07/15/2016 0542   HDL 39 (L) 07/15/2016 0542   CHOLHDL 5.3 07/15/2016 0542   VLDL 28 07/15/2016 0542   LDLCALC 138 (H) 07/15/2016 0542    HgbA1C: No results found for: HGBA1C  Urine Drug Screen:  No results found for: LABOPIA, COCAINSCRNUR, LABBENZ, AMPHETMU, THCU, LABBARB  Alcohol Level: No results for input(s): ETH in the last 168 hours.  Other results: EKG: normal EKG, normal sinus rhythm, unchanged from previous tracings.  Imaging: Ct Head Wo Contrast  Result Date: 07/14/2016 CLINICAL DATA:  Left-sided weakness and left-sided facial droop EXAM: CT HEAD WITHOUT CONTRAST TECHNIQUE: Contiguous axial images were obtained from the base of the skull through the vertex without intravenous contrast. COMPARISON:  None. FINDINGS: Brain: The ventricles are normal in size and configuration. There is no intracranial mass, hemorrhage, extra-axial fluid collection, or midline shift. No acute infarct is evident. Gray-white compartments appear within normal limits. Vascular: There is no hyperdense vessel. There is a small focus of calcification in the right carotid siphon. Skull: The bony calvarium appears intact. Sinuses/Orbits: Visualized paranasal sinuses are clear. There is slight rightward deviation the nasal septum. Orbits appear symmetric bilaterally. Other: Mastoid air cells are clear. IMPRESSION: No intracranial mass, hemorrhage, or focal gray - white compartment lesion. Minimal calcification in the right carotid  siphon. Rightward deviation of the nasal septum. Electronically Signed   By: Lowella Grip III M.D.   On: 07/14/2016 09:57   Mr Brain Wo Contrast  Result Date: 07/15/2016 CLINICAL DATA:  Left-sided weakness.  Left facial droop EXAM: MRI HEAD WITHOUT CONTRAST TECHNIQUE: Multiplanar, multiecho pulse sequences of the brain and surrounding structures were obtained without intravenous contrast. COMPARISON:  CT head 07/14/2016 FINDINGS: Acute infarct in the right  posterior putamen. No other areas of acute infarction Minimal chronic microvascular ischemic changes in the white matter. No cortical infarct. Ventricle size normal.  Cerebral volume normal. Negative for hemorrhage or fluid collection. Negative for mass or edema.  No shift of the midline structures. Circle of Willis patent. Normal orbital structures. Mild mucosal thickening in the paranasal sinuses. Pituitary normal in size IMPRESSION: Acute infarct in the posterior basal ganglia on the right involving the posterior putamen. Electronically Signed   By: Franchot Gallo M.D.   On: 07/15/2016 09:07   US Carotid Bilateral  Result Date: 07/14/2016 CLINICAL DATA:  Left-sided weakness and facial droop EXAM: BILATERAL CAROTID DUPLEX ULTRASOUND TECHNIQUE: Pearline Cables scale imaging, color Doppler and duplex ultrasound were performed of bilateral carotid and vertebral arteries in the neck. COMPARISON:  None. FINDINGS: Criteria: Quantification of carotid stenosis is based on velocity parameters that correlate the residual internal carotid diameter with NASCET-based stenosis levels, using the diameter of the distal internal carotid lumen as the denominator for stenosis measurement. The following peak systolic velocity measurements were obtained: RIGHT ICA:  56 cm/sec CCA:  96 cm/sec SYSTOLIC ICA/CCA RATIO:  0.6 DIASTOLIC ICA/CCA RATIO:  0.8 ECA:  68 cm/sec LEFT ICA:  60 cm/sec CCA:  83 cm/sec SYSTOLIC ICA/CCA RATIO:  0.7 DIASTOLIC ICA/CCA RATIO:  1.0 ECA:  78 cm/sec RIGHT CAROTID ARTERY: Real-time interrogation of the right carotid distribution reveals mild generalized intimal thickening without appreciable vessel narrowing. RIGHT VERTEBRAL ARTERY: Flow in the right vertebral artery is antegrade. LEFT CAROTID ARTERY: Real-time interrogation of the left carotid distribution shows mild generalized intimal thickening. There is mild plaque formation in the mid the distal common carotid artery causing approximately 20% diameter stenosis.  There is rather minimal plaque in the bifurcation region on the left. No stenosis approaching 50% diameter is seen. LEFT VERTEBRAL ARTERY: Flow in the left vertebral artery is antegrade. IMPRESSION: Mild plaque on the left. No evidence by either real-time interrogation or waveform criteria of hemodynamically significant obstructive disease in either carotid distribution. Flow in both vertebral arteries is in the anatomic direction. Electronically Signed   By: Lowella Grip III M.D.   On: 07/14/2016 11:59    Assessment: 60 y.o. male male with a past medical history significant for HTN admitted with 1 day history of left-sided weakness and left facial numbness. Came to ER where BP was elevated but head CT unremarkable.  Worsening exam this AM On ASA 81 at home   Stroke Risk Factors - hypertension   MRI shows R putamen/BG ischemic infarct.    Plan:  - will order CTA head as don't have intracranial studies - Pt has long family history of cardiac dz and strokes.  - currently on dual anti plt therapy  - if CTA does not show any hemodynamic intracranial stenosis, then would d/c ASA and just keep on Plavix.   - will need rehab.     Leotis Pain  07/15/2016, 1:00 PM

## 2016-07-15 NOTE — Care Management (Addendum)
Admitted to [facility] with the diagnosis of TIA under observation status. Positive for CVA converted to inpatient status. Lives with wife, Arville Go [phone] or [phone]). Last seen Dr. Sharol Given a month ago. Takes care of all basic and instrumental activities of daily living himself, drives. Works full time with [employer].  Good family support system Physical therapy is recommending Acute Inpatient Rehabilitation.  Mr & Ms [place] both agree that they would like to transfer to [place]. Will contact representative to discuss transfer. Manuela Schwartz [phone]) Shelbie Ammons RN MSN CCM Care Management [phone]

## 2016-07-15 NOTE — Progress Notes (Signed)
Inpatient Rehabilitation  I met with the patient and his wife at the bedside to discuss the CIR program.  Both are interested in pt. coming to CIR.  I have initiated the insurance authorization process with pt's [employer].  I will provide an update when I have received their decision.  Please call if questions.  [facility] Admissions Coordinator Cell [phone] Office [phone]

## 2016-07-15 NOTE — H&P (Signed)
Physical Medicine and Rehabilitation Admission H&P    Chief Complaint  Patient presents with  . Weakness  : HPI: 60 year old right-handed male with history of hypertension. Per chart review patient is married. Independent prior to admission working for [employer]. One level with one-step entry. Presented to [facility] 07/14/2016 with left-sided weakness and facial droop. Blood pressure elevated 170/106. Cranial CT scan negative. 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 CTA of the head showed no emergent large vessel occlusion or severe stenosis. Carotid Dopplers with no ICA stenosis. Patient did not receive TPA. Echocardiogram with ejection fraction of 60%. Normal systolic function. Placed on aspirin and Plavix for CVA prophylaxis. Subcutaneous Lovenox for DVT prophylaxis. Physical and occupational therapy evaluations completed with recommendations of physical medicine rehabilitation consult. Patient was admitted for comprehensive rehabilitation program  Review of Systems  Constitutional: Negative for chills and fever.       Left side incoordination  HENT: Negative for hearing loss.        Occasional headaches  Eyes: Negative for blurred vision and double vision.  Respiratory: Negative for cough and shortness of breath.   Cardiovascular: Negative for chest pain, palpitations and leg swelling.  Gastrointestinal: Positive for constipation. Negative for nausea and vomiting.       GERD  Genitourinary: Negative for dysuria, flank pain and hematuria.  Musculoskeletal: Positive for myalgias. Negative for falls.  Skin: Negative for rash.  Neurological: Negative for seizures and loss of consciousness.  All other systems reviewed and are negative.  Past Medical History:  Diagnosis Date  . Bone marrow disease   . Hypertension    History reviewed. No pertinent surgical history. History reviewed.  No pertinent family history. Social History:  reports that he has quit smoking. He has never used smokeless tobacco. He reports that he drinks alcohol. He reports that he does not use drugs. Allergies:  Allergies  Allergen Reactions  . Lipitor [Atorvastatin] Other (See Comments)    Blood clots.  . Lisinopril     Other reaction(s): Dizziness  . Losartan     Other reaction(s): Dizziness   Medications Prior to Admission  Medication Sig Dispense Refill  . amLODipine (NORVASC) 2.5 MG tablet Take 2.5 mg by mouth every evening.    Marland Kitchen aspirin EC 81 MG tablet Take 81 mg by mouth every evening.    . Cholecalciferol (VITAMIN D3) 1000 units CAPS Take 1,000 Units by mouth every morning.    . fluocinonide (LIDEX) 0.05 % external solution Apply 1 application topically 2 (two) times daily as needed. For eczema.    Marland Kitchen omeprazole (PRILOSEC) 20 MG capsule Take 20 mg by mouth daily as needed for heartburn or indigestion.      Home: Home Living Family/patient expects to be discharged to:: Private residence Living Arrangements: Spouse/significant other Available Help at Discharge: Family Type of Home: House Home Access: Stairs to enter Technical brewer of Steps: 1 Entrance Stairs-Rails: None Home Layout: One level Bathroom Accessibility: Yes Home Equipment: None   Functional History: Prior Function Level of Independence: Independent Comments: Pt was independent with all household and community ambulation. Independent with ADLs.  Functional Status:  Mobility: Bed Mobility Overal bed mobility: Needs Assistance Bed Mobility: Supine to Sit Supine to sit: Min assist General bed mobility comments: Pt able to swing LE off of bed. Unable to push through L UE to assist with lifting trunk off of bed due to  weakness, min assist. Pt able to scoot to EOB with no assist, primarily using R UE and LE. Transfers Overall transfer level: Needs assistance Equipment used: None Transfers: Sit to/from  Stand Sit to Stand: Min assist General transfer comment: Min assist for sit/stand, demonstrates safe technique. Unsteadiness noted due to L LE weakness, relies on R LE to maintain standing balance. Ambulation/Gait Ambulation/Gait assistance: Mod assist, +2 safety/equipment Ambulation Distance (Feet): 3 Feet Assistive device: Rolling walker (2 wheeled) Gait Pattern/deviations: Step-to pattern, Decreased step length - left, Decreased stride length, Decreased dorsiflexion - left General Gait Details: Pt requires +2 (for safety) mod assist with ambulation with RW. Decreased L hip flexion and L ankle doriflexion, decreased motor control. Increased L lateral lean due to L LE giving way, mod assist to maintain balance.  Gait velocity interpretation: Below normal speed for age/gender    ADL: ADL Overall ADL's : Needs assistance/impaired Eating/Feeding: Set up (Pt. is able to use his RUE, however requires hand-over hand assist for LUE use.) Grooming: Set up (Pt. requires hand-over hand assist to use her LUE to assist.) Lower Body Dressing: Maximal assistance Functional mobility during ADLs: Minimal assistance General ADL Comments: Pt. has difficulty using his left hand to complete light self care tasks.  Cognition: Cognition Overall Cognitive Status: Within Functional Limits for tasks assessed Orientation Level: Oriented X4 Cognition Arousal/Alertness: Awake/alert Behavior During Therapy: WFL for tasks assessed/performed Overall Cognitive Status: Within Functional Limits for tasks assessed  Physical Exam: Blood pressure (!) 142/89, pulse 78, temperature 98 F (36.7 C), temperature source Oral, resp. rate 18, height 6' (1.829 m), weight 101.2 kg (223 lb), SpO2 97 %. Physical Exam  Constitutional: He is oriented to person, place, and time. He appears well-developed.  HENT:  Mild left facial droop  Eyes: EOM are normal.  Neck: Normal range of motion. Neck supple. No thyromegaly present.   Cardiovascular: Normal rate and regular rhythm.   Respiratory: Effort normal and breath sounds normal. No respiratory distress.  GI: Soft. Bowel sounds are normal. He exhibits no distension.  Neurological: He is alert and oriented to person, place, and time.   Makes good eye contact with examiner. Follow full commands. Fair awareness of deficits. Mild dysarthria and left central 7. Speech intelligible. Intact cough. RUE and RLE 5/5. LUE 2+ delt, bicep, 2-tricep, 1- WE, tr finger ext. LLE: 2/5 HF, KE and trace APF, absent ADF. Sensation intact except for distal fingers which show sl decrease in light touch. DTR's 2+ left and 1+ right.   Skin: Skin is warm and dry.  Psychiatric: He has a normal mood and affect. His behavior is normal. Judgment and thought content normal.    Results for orders placed or performed during the hospital encounter of 07/14/16 (from the past 48 hour(s))  CBC with Differential     Status: None   Collection Time: 07/14/16  9:35 AM  Result Value Ref Range   WBC 5.9 3.8 - 10.6 K/uL   RBC 4.82 4.40 - 5.90 MIL/uL   Hemoglobin 14.7 13.0 - 18.0 g/dL   HCT 42.1 40.0 - 52.0 %   MCV 87.2 80.0 - 100.0 fL   MCH 30.4 26.0 - 34.0 pg   MCHC 34.9 32.0 - 36.0 g/dL   RDW 13.6 11.5 - 14.5 %   Platelets 233 150 - 440 K/uL   Neutrophils Relative % 54 %   Neutro Abs 3.2 1.4 - 6.5 K/uL   Lymphocytes Relative 33 %   Lymphs Abs 2.0 1.0 - 3.6  K/uL   Monocytes Relative 8 %   Monocytes Absolute 0.5 0.2 - 1.0 K/uL   Eosinophils Relative 4 %   Eosinophils Absolute 0.2 0 - 0.7 K/uL   Basophils Relative 1 %   Basophils Absolute 0.1 0 - 0.1 K/uL  Comprehensive metabolic panel     Status: Abnormal   Collection Time: 07/14/16  9:35 AM  Result Value Ref Range   Sodium 136 135 - 145 mmol/L   Potassium 4.3 3.5 - 5.1 mmol/L   Chloride 107 101 - 111 mmol/L   CO2 26 22 - 32 mmol/L   Glucose, Bld 101 (H) 65 - 99 mg/dL   BUN 18 6 - 20 mg/dL   Creatinine, Ser 0.90 0.61 - 1.24 mg/dL   Calcium  9.5 8.9 - 10.3 mg/dL   Total Protein 8.2 (H) 6.5 - 8.1 g/dL   Albumin 4.5 3.5 - 5.0 g/dL   AST 29 15 - 41 U/L   ALT 23 17 - 63 U/L   Alkaline Phosphatase 56 38 - 126 U/L   Total Bilirubin 0.6 0.3 - 1.2 mg/dL   GFR calc non Af Amer >60 >60 mL/min   GFR calc Af Amer >60 >60 mL/min    Comment: (NOTE) The eGFR has been calculated using the CKD EPI equation. This calculation has not been validated in all clinical situations. eGFR's persistently <60 mL/min signify possible Chronic Kidney Disease.    Anion gap 3 (L) 5 - 15  Troponin I     Status: None   Collection Time: 07/14/16  9:35 AM  Result Value Ref Range   Troponin I <0.03 <0.03 ng/mL  Urinalysis complete, with microscopic     Status: Abnormal   Collection Time: 07/14/16 12:20 PM  Result Value Ref Range   Color, Urine STRAW (A) YELLOW   APPearance CLEAR (A) CLEAR   Glucose, UA NEGATIVE NEGATIVE mg/dL   Bilirubin Urine NEGATIVE NEGATIVE   Ketones, ur NEGATIVE NEGATIVE mg/dL   Specific Gravity, Urine 1.009 1.005 - 1.030   Hgb urine dipstick NEGATIVE NEGATIVE   pH 6.0 5.0 - 8.0   Protein, ur NEGATIVE NEGATIVE mg/dL   Nitrite NEGATIVE NEGATIVE   Leukocytes, UA NEGATIVE NEGATIVE   RBC / HPF 0-5 0 - 5 RBC/hpf   WBC, UA 0-5 0 - 5 WBC/hpf   Bacteria, UA NONE SEEN NONE SEEN   Squamous Epithelial / LPF NONE SEEN NONE SEEN  Comprehensive metabolic panel     Status: None   Collection Time: 07/15/16  5:42 AM  Result Value Ref Range   Sodium 138 135 - 145 mmol/L   Potassium 4.0 3.5 - 5.1 mmol/L   Chloride 109 101 - 111 mmol/L   CO2 23 22 - 32 mmol/L   Glucose, Bld 97 65 - 99 mg/dL   BUN 17 6 - 20 mg/dL   Creatinine, Ser 0.87 0.61 - 1.24 mg/dL   Calcium 9.0 8.9 - 10.3 mg/dL   Total Protein 7.5 6.5 - 8.1 g/dL   Albumin 4.0 3.5 - 5.0 g/dL   AST 22 15 - 41 U/L   ALT 20 17 - 63 U/L   Alkaline Phosphatase 49 38 - 126 U/L   Total Bilirubin 0.9 0.3 - 1.2 mg/dL   GFR calc non Af Amer >60 >60 mL/min   GFR calc Af Amer >60 >60 mL/min      Comment: (NOTE) The eGFR has been calculated using the CKD EPI equation. This calculation has not been validated in all clinical  situations. eGFR's persistently <60 mL/min signify possible Chronic Kidney Disease.    Anion gap 6 5 - 15  CBC     Status: None   Collection Time: 07/15/16  5:42 AM  Result Value Ref Range   WBC 6.4 3.8 - 10.6 K/uL   RBC 4.60 4.40 - 5.90 MIL/uL   Hemoglobin 14.0 13.0 - 18.0 g/dL   HCT 40.4 40.0 - 52.0 %   MCV 87.7 80.0 - 100.0 fL   MCH 30.5 26.0 - 34.0 pg   MCHC 34.8 32.0 - 36.0 g/dL   RDW 13.5 11.5 - 14.5 %   Platelets 214 150 - 440 K/uL  Lipid panel     Status: Abnormal   Collection Time: 07/15/16  5:42 AM  Result Value Ref Range   Cholesterol 205 (H) 0 - 200 mg/dL   Triglycerides 139 <150 mg/dL   HDL 39 (L) >40 mg/dL   Total CHOL/HDL Ratio 5.3 RATIO   VLDL 28 0 - 40 mg/dL   LDL Cholesterol 138 (H) 0 - 99 mg/dL    Comment:        Total Cholesterol/HDL:CHD Risk Coronary Heart Disease Risk Table                     Men   Women  1/2 Average Risk   3.4   3.3  Average Risk       5.0   4.4  2 X Average Risk   9.6   7.1  3 X Average Risk  23.4   11.0        Use the calculated Patient Ratio above and the CHD Risk Table to determine the patient's CHD Risk.        ATP III CLASSIFICATION (LDL):  <100     mg/dL   Optimal  100-129  mg/dL   Near or Above                    Optimal  130-159  mg/dL   Borderline  160-189  mg/dL   High  >190     mg/dL   Very High    Ct Head Wo Contrast  Result Date: 07/14/2016 CLINICAL DATA:  Left-sided weakness and left-sided facial droop EXAM: CT HEAD WITHOUT CONTRAST TECHNIQUE: Contiguous axial images were obtained from the base of the skull through the vertex without intravenous contrast. COMPARISON:  None. FINDINGS: Brain: The ventricles are normal in size and configuration. There is no intracranial mass, hemorrhage, extra-axial fluid collection, or midline shift. No acute infarct is evident. Gray-white  compartments appear within normal limits. Vascular: There is no hyperdense vessel. There is a small focus of calcification in the right carotid siphon. Skull: The bony calvarium appears intact. Sinuses/Orbits: Visualized paranasal sinuses are clear. There is slight rightward deviation the nasal septum. Orbits appear symmetric bilaterally. Other: Mastoid air cells are clear. IMPRESSION: No intracranial mass, hemorrhage, or focal gray - white compartment lesion. Minimal calcification in the right carotid siphon. Rightward deviation of the nasal septum. Electronically Signed   By: Lowella Grip III M.D.   On: 07/14/2016 09:57   Mr Brain Wo Contrast  Result Date: 07/15/2016 CLINICAL DATA:  Left-sided weakness.  Left facial droop EXAM: MRI HEAD WITHOUT CONTRAST TECHNIQUE: Multiplanar, multiecho pulse sequences of the brain and surrounding structures were obtained without intravenous contrast. COMPARISON:  CT head 07/14/2016 FINDINGS: Acute infarct in the right posterior putamen. No other areas of acute infarction Minimal chronic microvascular ischemic changes in  the white matter. No cortical infarct. Ventricle size normal.  Cerebral volume normal. Negative for hemorrhage or fluid collection. Negative for mass or edema.  No shift of the midline structures. Circle of Willis patent. Normal orbital structures. Mild mucosal thickening in the paranasal sinuses. Pituitary normal in size IMPRESSION: Acute infarct in the posterior basal ganglia on the right involving the posterior putamen. Electronically Signed   By: Franchot Gallo M.D.   On: 07/15/2016 09:07   US Carotid Bilateral  Result Date: 07/14/2016 CLINICAL DATA:  Left-sided weakness and facial droop EXAM: BILATERAL CAROTID DUPLEX ULTRASOUND TECHNIQUE: Pearline Cables scale imaging, color Doppler and duplex ultrasound were performed of bilateral carotid and vertebral arteries in the neck. COMPARISON:  None. FINDINGS: Criteria: Quantification of carotid stenosis is based  on velocity parameters that correlate the residual internal carotid diameter with NASCET-based stenosis levels, using the diameter of the distal internal carotid lumen as the denominator for stenosis measurement. The following peak systolic velocity measurements were obtained: RIGHT ICA:  56 cm/sec CCA:  96 cm/sec SYSTOLIC ICA/CCA RATIO:  0.6 DIASTOLIC ICA/CCA RATIO:  0.8 ECA:  68 cm/sec LEFT ICA:  60 cm/sec CCA:  83 cm/sec SYSTOLIC ICA/CCA RATIO:  0.7 DIASTOLIC ICA/CCA RATIO:  1.0 ECA:  78 cm/sec RIGHT CAROTID ARTERY: Real-time interrogation of the right carotid distribution reveals mild generalized intimal thickening without appreciable vessel narrowing. RIGHT VERTEBRAL ARTERY: Flow in the right vertebral artery is antegrade. LEFT CAROTID ARTERY: Real-time interrogation of the left carotid distribution shows mild generalized intimal thickening. There is mild plaque formation in the mid the distal common carotid artery causing approximately 20% diameter stenosis. There is rather minimal plaque in the bifurcation region on the left. No stenosis approaching 50% diameter is seen. LEFT VERTEBRAL ARTERY: Flow in the left vertebral artery is antegrade. IMPRESSION: Mild plaque on the left. No evidence by either real-time interrogation or waveform criteria of hemodynamically significant obstructive disease in either carotid distribution. Flow in both vertebral arteries is in the anatomic direction. Electronically Signed   By: Lowella Grip III M.D.   On: 07/14/2016 11:59       Medical Problem List and Plan: 1.  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  DVT Prophylaxis/Anticoagulation: Subcutaneous Lovenox. Monitor platelet counts and any signs of bleeding 3. Pain Management: Tylenol as needed 4. Hypertension. Norvasc 2.5 mg daily. Monitor with increased mobility 5. Neuropsych: This patient is capable of making decisions on his own behalf. 6.  Skin/Wound Care: Routine skin checks upon admit 7. Fluids/Electrolytes/Nutrition: Routine I&O's with follow-up chemistries as needed. Labs done yesterday at [facility] were within normal limits except for elevated cholesterol 8. GERD. Protonix 9. Elevated lipids: apparently has an allergy to lipitor--"clots". Will need to review alternative choices/safety of these meds  -dietary choices and exercise to be reinforced     Post Admission Physician Evaluation: 1. Functional deficits secondary  to right posterior basal ganglia infarct . 2. Patient is admitted to receive collaborative, interdisciplinary care between the physiatrist, rehab nursing staff, and therapy team. 3. Patient's level of medical complexity and substantial therapy needs in context of that medical necessity cannot be provided at a lesser intensity of care such as a SNF. 4. Patient has experienced substantial functional loss from his/her baseline which was documented above under the "Functional History" and "Functional Status" headings.  Judging by the patient's diagnosis, physical exam, and functional history, the patient has potential for functional progress which will result in measurable gains while  on inpatient rehab.  These gains will be of substantial and practical use upon discharge  in facilitating mobility and self-care at the household level. 5. Physiatrist will provide 24 hour management of medical needs as well as oversight of the therapy plan/treatment and provide guidance as appropriate regarding the interaction of the two. 6. 24 hour rehab nursing will assist with bladder management, bowel management, safety, skin/wound care, disease management, medication administration, pain management and patient education  and help integrate therapy concepts, techniques,education, etc. 7. PT will assess and treat for/with: Lower extremity strength, range of motion, stamina, balance, functional mobility, safety, adaptive techniques and  equipment, NMR, orthotics, family education, ego support, community reintegration.   Goals are: mod I. 8. OT will assess and treat for/with: ADL's, functional mobility, safety, upper extremity strength, adaptive techniques and equipment, NMR, orthotics, family education, ego support.   Goals are: mod I. Therapy may proceed with showering this patient. 9. SLP will assess and treat for/with: n/a.  Goals are: n/a. 10. Case Management and Social Worker will assess and treat for psychological issues and discharge planning. 11. Team conference will be held weekly to assess progress toward goals and to determine barriers to discharge. 12. Patient will receive at least 3 hours of therapy per day at least 5 days per week. 13. ELOS: 14-17 days       14. Prognosis:  excellent     Meredith Staggers, MD, [facility] 07/16/2016  07/15/2016

## 2016-07-15 NOTE — Progress Notes (Signed)
*  PRELIMINARY RESULTS* Echocardiogram 2D Echocardiogram has been performed.  Jason Oconnor 07/15/2016, 8:04 AM

## 2016-07-16 ENCOUNTER — Inpatient Hospital Stay (HOSPITAL_COMMUNITY)
Admission: RE | Admit: 2016-07-16 | Discharge: 2016-08-03 | DRG: 093 | Disposition: A | Discharge status: Home / Self Care | Payer: 59 | Source: Intra-hospital | Attending: Physical Medicine & Rehabilitation | Admitting: Physical Medicine & Rehabilitation

## 2016-07-16 DIAGNOSIS — Z79899 Other long term (current) drug therapy: Secondary | ICD-10-CM | POA: Diagnosis not present

## 2016-07-16 DIAGNOSIS — I69359 Hemiplegia and hemiparesis following cerebral infarction affecting unspecified side: Secondary | ICD-10-CM

## 2016-07-16 DIAGNOSIS — M542 Cervicalgia: Secondary | ICD-10-CM | POA: Diagnosis present

## 2016-07-16 DIAGNOSIS — Z888 Allergy status to other drugs, medicaments and biological substances status: Secondary | ICD-10-CM | POA: Diagnosis not present

## 2016-07-16 DIAGNOSIS — R531 Weakness: Secondary | ICD-10-CM | POA: Diagnosis present

## 2016-07-16 DIAGNOSIS — R419 Unspecified symptoms and signs involving cognitive functions and awareness: Secondary | ICD-10-CM

## 2016-07-16 DIAGNOSIS — F99 Mental disorder, not otherwise specified: Secondary | ICD-10-CM | POA: Diagnosis not present

## 2016-07-16 DIAGNOSIS — R269 Unspecified abnormalities of gait and mobility: Principal | ICD-10-CM | POA: Diagnosis present

## 2016-07-16 DIAGNOSIS — I10 Essential (primary) hypertension: Secondary | ICD-10-CM | POA: Diagnosis present

## 2016-07-16 DIAGNOSIS — E785 Hyperlipidemia, unspecified: Secondary | ICD-10-CM | POA: Diagnosis present

## 2016-07-16 DIAGNOSIS — G8324 Monoplegia of upper limb affecting left nondominant side: Secondary | ICD-10-CM | POA: Diagnosis present

## 2016-07-16 DIAGNOSIS — M21372 Foot drop, left foot: Secondary | ICD-10-CM

## 2016-07-16 DIAGNOSIS — I639 Cerebral infarction, unspecified: Secondary | ICD-10-CM

## 2016-07-16 DIAGNOSIS — G8194 Hemiplegia, unspecified affecting left nondominant side: Secondary | ICD-10-CM | POA: Diagnosis not present

## 2016-07-16 DIAGNOSIS — M6289 Other specified disorders of muscle: Secondary | ICD-10-CM | POA: Diagnosis not present

## 2016-07-16 DIAGNOSIS — R29810 Facial weakness: Secondary | ICD-10-CM | POA: Diagnosis present

## 2016-07-16 DIAGNOSIS — Z7982 Long term (current) use of aspirin: Secondary | ICD-10-CM

## 2016-07-16 DIAGNOSIS — I63311 Cerebral infarction due to thrombosis of right middle cerebral artery: Secondary | ICD-10-CM | POA: Diagnosis not present

## 2016-07-16 DIAGNOSIS — K219 Gastro-esophageal reflux disease without esophagitis: Secondary | ICD-10-CM | POA: Diagnosis present

## 2016-07-16 LAB — CBC
HEMATOCRIT: 42.5 % (ref 39.0–52.0)
HEMOGLOBIN: 14 g/dL (ref 13.0–17.0)
MCH: 29.5 pg (ref 26.0–34.0)
MCHC: 32.9 g/dL (ref 30.0–36.0)
MCV: 89.7 fL (ref 78.0–100.0)
Platelets: 247 10*3/uL (ref 150–400)
RBC: 4.74 MIL/uL (ref 4.22–5.81)
RDW: 13.1 % (ref 11.5–15.5)
WBC: 6.9 10*3/uL (ref 4.0–10.5)

## 2016-07-16 LAB — CREATININE, SERUM
Creatinine, Ser: 1.04 mg/dL (ref 0.61–1.24)
GFR calc Af Amer: >60 mL/min (ref >60)
GFR calc non Af Amer: >60 mL/min (ref >60)

## 2016-07-16 MED ORDER — ACETAMINOPHEN 325 MG PO TABS
650.0000 mg | ORAL_TABLET | Freq: Four times a day (QID) | ORAL | Status: DC | PRN
  Administered 2016-07-18 – 2016-08-02 (×5): 650 mg via ORAL
  Filled 2016-07-16 (×5): qty 2

## 2016-07-16 MED ORDER — ENOXAPARIN SODIUM 40 MG/0.4ML ~~LOC~~ SOLN: 40.0000 mg | SUBCUTANEOUS | Status: DC

## 2016-07-16 MED ORDER — ONDANSETRON HCL 4 MG/2ML IJ SOLN: 4.0000 mg | Freq: Four times a day (QID) | INTRAMUSCULAR | Status: DC | PRN

## 2016-07-16 MED ORDER — BISACODYL 10 MG RE SUPP: 10.0000 mg | Freq: Every day | RECTAL | Status: DC | PRN

## 2016-07-16 MED ORDER — DOCUSATE SODIUM 100 MG PO CAPS
100.0000 mg | ORAL_CAPSULE | Freq: Two times a day (BID) | ORAL | Status: DC
  Administered 2016-07-16: 100 mg via ORAL
  Filled 2016-07-16 (×2): qty 1

## 2016-07-16 MED ORDER — AMLODIPINE BESYLATE 2.5 MG PO TABS
2.5000 mg | ORAL_TABLET | Freq: Every evening | ORAL | Status: DC
  Administered 2016-07-17 – 2016-07-19 (×3): 2.5 mg via ORAL
  Filled 2016-07-16 (×3): qty 1

## 2016-07-16 MED ORDER — ROSUVASTATIN CALCIUM 5 MG PO TABS
5.0000 mg | ORAL_TABLET | Freq: Every day | ORAL | Status: DC
  Filled 2016-07-16: qty 1

## 2016-07-16 MED ORDER — ENOXAPARIN SODIUM 40 MG/0.4ML ~~LOC~~ SOLN
40.0000 mg | SUBCUTANEOUS | Status: DC
  Administered 2016-07-16 – 2016-08-02 (×18): 40 mg via SUBCUTANEOUS
  Filled 2016-07-16 (×18): qty 0.4

## 2016-07-16 MED ORDER — ACETAMINOPHEN 650 MG RE SUPP: 650.0000 mg | Freq: Four times a day (QID) | RECTAL | Status: DC | PRN

## 2016-07-16 MED ORDER — VITAMIN D3 25 MCG (1000 UNIT) PO TABS
1000.0000 [IU] | ORAL_TABLET | ORAL | Status: DC
  Administered 2016-07-17 – 2016-08-03 (×18): 1000 [IU] via ORAL
  Filled 2016-07-16 (×35): qty 1

## 2016-07-16 MED ORDER — CLOPIDOGREL BISULFATE 75 MG PO TABS: 75.0000 mg | ORAL_TABLET | Freq: Every day | ORAL | 0 refills | Status: DC

## 2016-07-16 MED ORDER — PANTOPRAZOLE SODIUM 40 MG PO TBEC
40.0000 mg | DELAYED_RELEASE_TABLET | Freq: Every day | ORAL | Status: DC
Start: 2016-07-17 — End: 2016-08-03
  Administered 2016-07-17 – 2016-08-03 (×18): 40 mg via ORAL
  Filled 2016-07-16 (×18): qty 1

## 2016-07-16 MED ORDER — CLOPIDOGREL BISULFATE 75 MG PO TABS
75.0000 mg | ORAL_TABLET | Freq: Every day | ORAL | Status: DC
  Administered 2016-07-17 – 2016-08-03 (×18): 75 mg via ORAL
  Filled 2016-07-16 (×18): qty 1

## 2016-07-16 MED ORDER — ONDANSETRON HCL 4 MG PO TABS: 4.0000 mg | ORAL_TABLET | Freq: Four times a day (QID) | ORAL | Status: DC | PRN

## 2016-07-16 MED ORDER — ACETAMINOPHEN 325 MG PO TABS: 650.0000 mg | ORAL_TABLET | Freq: Four times a day (QID) | ORAL | Status: AC | PRN

## 2016-07-16 MED ORDER — SORBITOL 70 % SOLN
30.0000 mL | Freq: Every day | Status: DC | PRN
Start: 2016-07-16 — End: 2016-08-03

## 2016-07-16 MED ORDER — HYDROCODONE-ACETAMINOPHEN 5-325 MG PO TABS: 1.0000 | ORAL_TABLET | Freq: Four times a day (QID) | ORAL | 0 refills | Status: DC | PRN

## 2016-07-16 MED ORDER — ROSUVASTATIN CALCIUM 5 MG PO TABS: 5.0000 mg | ORAL_TABLET | Freq: Every day | ORAL | 0 refills | Status: DC

## 2016-07-16 NOTE — Progress Notes (Signed)
Physical Therapy Treatment Patient Details Name: Jason Oconnor MRN: [id number] DOB: 08/02/1956 Today's Date: 07/16/2016    History of Present Illness Pt. is a 60 y.o. male who was admitted to [facility]  with left sided weakness, and facial droop. PMH: include: HTN.    PT Comments    Per chart review, CT angio performed yesterday evening showed evolving nonhemorrhagic R basal ganglia infarct, consulted Dr. Irish Elders who verbally ok'd treatment with no restrictions. Pt reports no pain today but does mention he is feeling weaker, wife in room today, very encouraging throughout session. UE and LE strength grossly the same as yesterday. Noted decreased motor coordination with L UE and L LE. L LE RAMPs significantly decreased speed and control today. Pt requires min assist for bed mobility and transfers and +2 mod assist for ambulation. Pt unable to voluntarily place L hand to intended target, uses R UE to assist with movement of L UE throughout session. Pt able to maintain sitting balance with single UE support with moderate multidirectional perturbations, however significant R lateral lean and decreased balance with seated ther-ex. Max assist to place L hand on RW, unable to grip well and falls off RW x1. Pt continues to demonstrate L knee buckling upon sit/stand. Pt also continues to demonstrate significant deficits with ambulation. Decreased motor control and L LE foot drag (decreased L hip flexion and L ankle dorsiflexion), this indicates high risk for falls. Pt may benefit from L PFRW secondary to decreased strength and coordination in L hand as compared to yesterday. Pt will benefit from skilled PT services in order to improve strength, ROM, balance, endurance, functional mobility, and safety with DME use. At this time pt is appropriate for inpatient rehab to address deficits.    Follow Up Recommendations  CIR     Equipment Recommendations  Rolling walker with 5" wheels    Recommendations for Other  Services       Precautions / Restrictions Precautions Precautions: Fall Restrictions Weight Bearing Restrictions: No    Mobility  Bed Mobility Overal bed mobility: Needs Assistance Bed Mobility: Supine to Sit     Supine to sit: Min assist     General bed mobility comments: Pt able to swing LE off of bed. Increased exertion to push through L UE and use upper body to assist with lifting trunk off of bed due to weakness, min assist. Pt able to scoot to EOB with min guard, primarily using R UE and LE. Unable to voluntarily move L UE, uses R UE to place L arm in position of comfort.  Transfers Overall transfer level: Needs assistance Equipment used: Rolling walker (2 wheeled) Transfers: Sit to/from Stand Sit to Stand: Min assist         General transfer comment: Min assist for sit/stand with RW, unsteadiness noted due to L LE weakness, relies on R UE and R LE to maintain standing balance. Requires max assist to place L UE on RW, unable to maintain grip with slipping off occasionally.  Ambulation/Gait Ambulation/Gait assistance: Mod assist;+2 physical assistance Ambulation Distance (Feet): 3 Feet Assistive device: Rolling walker (2 wheeled) Gait Pattern/deviations: Step-to pattern;Ataxic;Decreased stride length;Decreased step length - left;Decreased dorsiflexion - left;Trunk flexed   Gait velocity interpretation: Below normal speed for age/gender General Gait Details: Pt continues to require +2 (for safety) mod assist with ambulation with RW. Decreased L hip flexion and L ankle doriflexion, decreased motor coordination of L LE vs. yesterday. Increased L lateral lean due to L LE giving way, mod  assist to maintain balance. Verbal cueing to pick feet up and keep RW close to body for safety.   Stairs            Wheelchair Mobility    Modified Rankin (Stroke Patients Only)       Balance Overall balance assessment: Needs assistance Sitting-balance support: Single  extremity supported;Feet supported Sitting balance-Leahy Scale: Good Sitting balance - Comments: Pt able to maintain sitting balance with R UE support on rail. Multidirectional perturbations with mod force with R UE, min guard, no LOB noted, good sitting balance. Min assist to maintain balance with seated ther-ex, increased R lateral lean.   Standing balance support: Bilateral upper extremity supported Standing balance-Leahy Scale: Fair Standing balance comment: Continues to require +2 assist to maintain standing balance with UE support on RW. L LE weakness causes L knee to buckle and give way more easily.                    Cognition Arousal/Alertness: Awake/alert Behavior During Therapy: WFL for tasks assessed/performed Overall Cognitive Status: Within Functional Limits for tasks assessed                      Exercises Other Exercises Other Exercises: Supine ther-ex: L LE ankle pumps with max assist; quad sets, glut sets, and hip add isometrics with min assist; SLRs, heel slides, SAQs, and hip abd/add with mod assist. All ther ex x 10 reps with verbal and tactile cueing for proper technique. Other Exercises: Seated ther ex: L LE marches and LAQs x 10 reps with verbal and tactile cueing. Mod assist when bringing L LE back to starting position, decreased motor coordination noted.     General Comments        Pertinent Vitals/Pain Pain Assessment: No/denies pain    Home Living                      Prior Function            PT Goals (current goals can now be found in the care plan section) Acute Rehab PT Goals Patient Stated Goal: To return to independence. PT Goal Formulation: With patient Time For Goal Achievement: 07/29/16 Potential to Achieve Goals: Good Progress towards PT goals: Progressing toward goals    Frequency  7X/week    PT Plan Current plan remains appropriate    Co-evaluation             End of Session Equipment Utilized  During Treatment: Gait belt Activity Tolerance: Patient tolerated treatment well Patient left: in chair;with call bell/phone within reach;with chair alarm set;with family/visitor present     Time: 0920-0951 PT Time Calculation (min) (ACUTE ONLY): 31 min  Charges:                       G Codes:      Georgina Pillion 03-Aug-2016, 11:18 AM  Georgina Pillion, SPT [phone]

## 2016-07-16 NOTE — Progress Notes (Signed)
Occupational Therapy Treatment Patient Details Name: Jason Oconnor MRN: [id number] DOB: [birth year] Today's Date: 07/16/2016    History of present illness Pt. is a 60 y.o. male who was admitted to [facility]  with left sided weakness, and facial droop. PMH: include: HTN.   OT comments  Pt presents with active movement in L shoulder and elbow and minimal in wrist and hand today and his wife stated his stroke extended since yesterday.  Worked on quick stretches to increase active movement in hand flexion and extension with demonstration for edema massage in case he starts to have edema.  He has good flexibility in hand and fingers with some stiffness in DIP joints and has hx of frozen shoulder but was able to increase AROM with help from his wife pushing him to do active exercises. Proprioception intact in LUE and hand.  Educated on proper rolling and position of LUE and instructed not to pull on LUE due to risk of subluxation.  Wife asking a lot of good questions throughout session.   Follow Up Recommendations  CIR    Equipment Recommendations       Recommendations for Other Services      Precautions / Restrictions Precautions Precautions: Fall Restrictions Weight Bearing Restrictions: No       Mobility Bed Mobility Overal bed mobility: Needs Assistance Bed Mobility: Supine to Sit     Supine to sit: Min assist     General bed mobility comments: Pt able to swing LE off of bed. Increased exertion to push through L UE and use upper body to assist with lifting trunk off of bed due to weakness, min assist. Pt able to scoot to EOB with min guard, primarily using R UE and LE. Unable to voluntarily move L UE, uses R UE to place L arm in position of comfort.  Transfers Overall transfer level: Needs assistance Equipment used: Rolling walker (2 wheeled) Transfers: Sit to/from Stand Sit to Stand: Min assist         General transfer comment: Min assist for sit/stand with RW, unsteadiness  noted due to L LE weakness, relies on R UE and R LE to maintain standing balance. Requires max assist to place L UE on RW, unable to maintain grip with slipping off occasionally.    Balance Overall balance assessment: Needs assistance Sitting-balance support: Single extremity supported;Feet supported Sitting balance-Leahy Scale: Good Sitting balance - Comments: Pt able to maintain sitting balance with R UE support on rail. Multidirectional perturbations with mod force with R UE, min guard, no LOB noted, good sitting balance. Min assist to maintain balance with seated ther-ex, increased R lateral lean.   Standing balance support: Bilateral upper extremity supported Standing balance-Leahy Scale: Fair Standing balance comment: Continues to require +2 assist to maintain standing balance with UE support on RW. L LE weakness causes L knee to buckle and give way more easily.                   ADL Overall ADL's : Needs assistance/impaired Eating/Feeding: Set up Eating/Feeding Details (indicate cue type and reason): rec using dycem for self feeding to help keep plate an bowl in place.                                     General ADL Comments: Pt presents with active movement in L shoulder and elbow and minimal in wrist and hand today and  his wife stated his stroke extended since yesterday.  Worked on quick reflexes to increase active movement in hand flexion and extension with demonstration for edema massage in case he starts to have edema.  He has good flexibility in hand and fingers with some stiffness in DIP joints and has hx of frozen shoulder but was able to increase AROM with help from his wife pushing him to do active exercises.  Educate on proper rolling and position of LUE and instructed not to pull on LUE due to risk of subluxation.  Wife asking a lot of good questions throughout session.      Vision                     Perception     Praxis      Cognition    Behavior During Therapy: WFL for tasks assessed/performed Overall Cognitive Status: Within Functional Limits for tasks assessed       Memory: Decreased short-term memory               Extremity/Trunk Assessment               Exercises Other Exercises Other Exercises: Supine ther-ex: L LE ankle pumps with max assist; quad sets, glut sets, and hip add isometrics with min assist; SLRs, heel slides, SAQs, and hip abd/add with mod assist. All ther ex x 10 reps with verbal and tactile cueing for proper technique. Other Exercises: Seated ther ex: L LE marches and LAQs x 10 reps with verbal and tactile cueing. Mod assist when bringing L LE back to starting position, decreased motor coordination noted.    Shoulder Instructions       General Comments      Pertinent Vitals/ Pain       Pain Assessment: No/denies pain  Home Living                     Bathroom Shower/Tub: Walk-in shower                    Prior Functioning/Environment              Frequency Min 3X/week     Progress Toward Goals  OT Goals(current goals can now be found in the care plan section)  Progress towards OT goals: Progressing toward goals  Acute Rehab OT Goals Patient Stated Goal: To return to independence. OT Goal Formulation: With patient/family Potential to Achieve Goals: Good  Plan Discharge plan remains appropriate    Co-evaluation                 End of Session     Activity Tolerance Patient tolerated treatment well   Patient Left in chair;with chair alarm set;with family/visitor present   Nurse Communication          Time: [id number] OT Time Calculation (min): 55 min  Charges: OT General Charges $OT Visit: 1 Procedure OT Treatments $Self Care/Home Management : 8-22 mins $Neuromuscular Re-education: 38-52 mins    Chrys Racer, OTR/L ascom [phone] 07/16/16, 12:32 PM

## 2016-07-16 NOTE — Progress Notes (Signed)
Inpatient Rehabilitation  I have received [facility] approval for IP Rehab admission.  I have updated Jeannie Fend, [facility] as well as the patient and his wife.  Pt. and wife are pleased.  Hassan Rowan has communicated with Dr. Leslye Peer and discharge orders and summary have been written.  I have requested for Hassan Rowan to arrange  [facility] transport before 2pm.  Please call if questions.    [place] Admissions Coordinator Cell [phone] Office [phone]

## 2016-07-16 NOTE — Interval H&P Note (Signed)
Jason Oconnor was admitted today to Inpatient Rehabilitation with the diagnosis of right posterior basal ganglia infarct.  The patient's history has been reviewed, patient examined, and there is no change in status.  Patient continues to be appropriate for intensive inpatient rehabilitation.  I have reviewed the patient's chart and labs.  Questions were answered to the patient's satisfaction. The PAPE has been reviewed and assessment remains appropriate.  Teofilo Lupinacci T 07/16/2016, 4:17 PM

## 2016-07-16 NOTE — PMR Pre-admission (Signed)
[facility] PMR Admission Coordinator Pre-Admission Assessment  Patient: Jason Oconnor is an 60 y.o., male MRN: [id number] DOB: Sep 01, 1956 Height: 6' (182.9 cm) Weight: 102.2 kg (225 lb 4.8 oz)  Insurance Information HMO:    PPO:      PCP:      IPA:      80/20:      OTHER: POS PRIMARY:  UHC       Policy#:  [id number]      Subscriber:  self CM Name:  Eulah Citizen      Phone#:  [phone]     Fax#:  EMR access Pre-Cert#:  [id number], approved for admission to CIR; follow up to be done by Sherlynn Stalls via EMR access, phone [phone], fax [phone]      Employer:  [employer] Benefits:  Phone #:  [phone]     Name:  Cecile Hearing. Date:  11/26/15     Deduct:  $600      Out of Pocket Max:  $2500      Life Max:  N/a  CIR:  80%/20%      SNF:  80%/20% Outpatient: $35 per visit     Co-Pay:   Home Health:  80%      Co-Pay:  20% DME:  80%     Co-Pay:  20% Providers:  In network SECONDARY:       Policy#:       Subscriber:  CM Name:       Phone#:      Fax#:  Pre-Cert#:       Employer:  Benefits:  Phone #:      Name:  Eff. Date:      Deduct:       Out of Pocket Max:       Life Max:  CIR:       SNF:  Outpatient:      Co-Pay:  Home Health:       Co-Pay:  DME:      Co-Pay:   Medicaid Application Date:       Case Manager:  Disability Application Date:       Case Worker:   Emergency Contact Information Contact Information    Name Relation Home Work Mobile   Ocasio,[name] Spouse [phone]        Current Medical History  Patient Admitting Diagnosis: R posterior basal ganglia infarct History of Present Illness: 60 year old right-handed male with history of hypertension. Per chart review patient is married. Independent prior to admission working for [employer]. One level with one-step entry. Presented to [facility] 07/14/2016 with left-sided weakness and facial droop. Blood pressure elevated 170/106. Cranial CT scan negative. MRI of the brain showed acute  infarct in the posterior basal ganglia on the right involving the posterior putamen. CTA of the head showed no emergent large vessel occlusion or severe stenosis. Carotid Dopplers with no ICA stenosis. Patient did not receive TPA. Echocardiogram with ejection fraction of 60%. Normal systolic function. Placed on aspirin and Plavix for CVA prophylaxis. Subcutaneous Lovenox for DVT prophylaxis. Physical and occupational therapy evaluations completed with recommendations of physical medicine rehabilitation consult. Patient was admitted for comprehensive rehabilitation program  Patient's medical record from [facility] has been reviewed by the rehabilitation admission coordinator and physician. NIH Stroke scale: 4 Glascow Coma Scale:  Past Medical History  Past Medical History:  Diagnosis Date  . Bone marrow disease   . Hypertension     Family History   family  history is not on file.  Prior Rehab/Hospitalizations Has the patient had major surgery during 100 days prior to admission? No    Current Medications See MAR  Patients Current Diet:    Soft diet, thin liquids  Precautions / Restrictions Precautions Precautions: Fall Restrictions Weight Bearing Restrictions: No   Has the patient had 2 or more falls or a fall with injury in the past year?No  Prior Activity Level Community (5-7x/wk): Pt. was working full time with [employer] P[employer].  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  Prior Functional Level Self Care: Did the patient need help bathing, dressing, using the toilet or eating?  Independent  Indoor Mobility: Did the patient need assistance with walking from room to room (with or without device)? Independent  Stairs: Did the patient need assistance with internal or external stairs (with or without device)? Independent  Functional Cognition: Did the patient need help  planning regular tasks such as shopping or remembering to take medications? Independent  Home Assistive Devices / Equipment Home Assistive Devices/Equipment: None Home Equipment: None  Prior Device Use: Indicate devices/aids used by the patient prior to current illness, exacerbation or injury? None    Prior Functional Level Current Functional Level  Bed Mobility  Independent  Min assist   Transfers  Independent  Min assist   Mobility - Walk/Wheelchair  Independent  Mod assist (+2 mod assist)   Upper Body Dressing  Independent   (not tested)   Lower Body Dressing  Independent  Max assist   Grooming  Independent  Other (set up)   Eating/Drinking  Independent  Other (set up)   Toilet Transfer  Independent  Min assist   Bladder Continence   continent  using urinal, continent   Bowel Management  continent  last BM 07/13/16, continent   Stair Climbing   independent   (not tested)   Communication  WNL  WNL   Memory  WNL  WNL   Cooking/Meal Prep  shares responsibilities with wife      Housework  shares responsibility with wife    Money Management  independent    Driving   independent      Special needs/care consideration BiPAP/CPAP  Wife reports pt. Snores but has not been diagnosed with sleep apnea CPM   no Continuous Drip IV   no Dialysis  no        Life Vest   no Oxygen   no Special Bed   no Trach Size   no Wound Vac (area)   no       Skin  WDL per nursing assessment                               Bowel mgmt:    Last BM 07/13/16 , continent Bladder mgmt: continent Diabetic mgmt n/a  Previous Home Environment Living Arrangements: Spouse/significant other Available Help at Discharge: Family Type of Home: House Home Layout: One level Home Access: Stairs to enter Entrance Stairs-Rails: None Technical brewer of Steps: 1 Bathroom Shower/Tub: Art gallery manager: Yes Home Care Services: No  Discharge Living  Setting Plans for Discharge Living Setting: Patient's home Type of Home at Discharge: House Discharge Home Layout: One level Discharge Home Access: Stairs to enter Entrance Stairs-Rails: None Entrance Stairs-Number of Steps: 1 Discharge Bathroom Shower/Tub: Pharmacologist Toilet: Handicapped  height Discharge Bathroom Accessibility: Yes How Accessible: Accessible via walker Does the patient have any problems obtaining your medications?: No  Social/Family/Support Systems Patient Roles: Spouse, Parent Anticipated Caregiver: wife, Rickye Chai Anticipated Caregiver's Contact Information: [phone] (home); wife is carrying pt's work phone [phone] Ability/Limitations of Caregiver: Wife works for [employer] and states she has 200 days in her bank of days off.  Anticipates she will be able to take as much time off as needed to care for her husband Caregiver Availability: 24/7 Discharge Plan Discussed with Primary Caregiver: Yes Is Caregiver In Agreement with Plan?: Yes Does Caregiver/Family have Issues with Lodging/Transportation while Pt is in Rehab?: No  Goals/Additional Needs Patient/Family Goal for Rehab: supervision and min assist PT and OT; n/a SLP Expected length of stay: 19-21 days Cultural Considerations: none Dietary Needs: soft diet, thin liquids Equipment Needs: TBA Special Service Needs: n/a Pt/Family Agrees to Admission and willing to participate: Yes Program Orientation Provided & Reviewed with Pt/Caregiver Including Roles  & Responsibilities: Yes  Patient Condition: Patient was admitted to [facility] with a right basal ganglia CVA.  He has been receiving PT/OT services.  Therapies are recommending inpatient rehab admission.  Patient will benefit from and tolerate 3 hours of therapy a day preparing him for home with wife.  The above assessment and information have been reviewed  with Dr. Naaman Plummer.  I have approval from Dr. Naaman Plummer for acute  inpatient rehabilitation admission this date.    Preadmission Screen Completed By:  Gerlean Ren, 07/16/2016 10:42 AM ______________________________________________________________________   Discussed status with Dr.  Naaman Plummer  on  07/16/16  at  1134  and received telephone approval for admission today.  Admission Coordinator:  Gerlean Ren, time  [id number] /Date  07/16/16   Assessment/Plan: Diagnosis: right basal ganglia infarct 1. Does the need for close, 24 hr/day  Medical supervision in concert with the patient's rehab needs make it unreasonable for this patient to be served in a less intensive setting? Yes 2. Co-Morbidities requiring supervision/potential complications: HTN, bone marrow disease 3. Due to bladder management, bowel management, safety, skin/wound care, disease management, medication administration, pain management and patient education, does the patient require 24 hr/day rehab nursing? Yes 4. Does the patient require coordinated care of a physician, rehab nurse, PT (1-2 hrs/day, 5 days/week) and OT (1-2 hrs/day, 5 days/week) to address physical and functional deficits in the context of the above medical diagnosis(es)? Yes Addressing deficits in the following areas: balance, endurance, locomotion, strength, transferring, bowel/bladder control, bathing, dressing, feeding, grooming and toileting 5. Can the patient actively participate in an intensive therapy program of at least 3 hrs of therapy 5 days a week? Yes 6. The potential for patient to make measurable gains while on inpatient rehab is excellent 7. Anticipated functional outcomes upon discharge from inpatients are: supervision and min assist PT, supervision and min assist OT, n/a SLP 8. Estimated rehab length of stay to reach the above functional goals is: 19-21 days 9. Does the patient have adequate social supports to accommodate these discharge functional goals? Yes 10. Anticipated D/C setting: Home 11. Anticipated  post D/C treatments: [place] therapy 12. Overall Rehab/Functional Prognosis: excellent    RECOMMENDATIONS: This patient's condition is appropriate for continued rehabilitative care in the following setting: CIR Patient has agreed to participate in recommended program. Yes Note that insurance prior authorization may be required for reimbursement for recommended care.  Comment: admit to CIR today  Meredith Staggers, MD, [facility] 07/16/2016  Gerlean Ren 07/16/2016

## 2016-07-16 NOTE — Care Management (Signed)
Approved for admission to Inpatient Acute Rehabilitation at [facility] per [facility]. Nurse Merry Proud and Dr. Leslye Peer updated. Discharge to [facility] today per Dr. Maryfrances Bunnell RN MSN CCM Care Management [phone]

## 2016-07-16 NOTE — Progress Notes (Signed)
Meredith Staggers, MD Physician Signed Physical Medicine and Rehabilitation  PMR Pre-admission Date of Service: 07/16/2016 10:42 AM  Related encounter: ED to Hosp-Admission (Current) from 07/14/2016 in [place] (1C)       [] Hide copied text   [employer] PMR Admission Coordinator Pre-Admission Assessment  Patient: Jason Oconnor is an 60 y.o., male MRN: [id number] DOB: Apr 23, 1956 Height: 6' (182.9 cm) Weight: 102.2 kg (225 lb 4.8 oz)  Insurance Information HMO:    PPO:      PCP:      IPA:      80/20:      OTHER: POS PRIMARY:  [employer]       Policy#:  [id number]      Subscriber:  self CM Name:  Jason Oconnor      Phone#:  [phone]     Fax#:  EMR access Pre-Cert#:  [id number], approved for admission to CIR; follow up to be done by Jason Oconnor via EMR access, phone [phone], fax [phone]      Employer:  Jason Oconnor Benefits:  Phone #:  [phone]     Name:  Jason Oconnor. Date:  Oconnor     Deduct:  $600      Out of Pocket Max:  $2500      Life Max:  N/a                  CIR:  80%/20%      SNF:  80%/20% Outpatient: $35 per visit     Co-Pay:   Home Health:  80%      Co-Pay:  20% DME:  80%     Co-Pay:  20% Providers:  In network SECONDARY:       Policy#:       Subscriber:  CM Name:       Phone#:      Fax#:  Pre-Cert#:       Employer:  Benefits:  Phone #:      Name:  Eff. Date:      Deduct:       Out of Pocket Max:       Life Max:  CIR:       SNF:  Outpatient:      Co-Pay:  Home Health:       Co-Pay:  DME:      Co-Pay:   Medicaid Application Date:       Case Manager:  Disability Application Date:       Case Worker:   Emergency Contact Information        Contact Information    Name Relation Home Work Mobile   Oconnor,Jason Spouse [phone]        Current Medical History  Patient Admitting Diagnosis: R posterior basal ganglia infarct History of Present Illness: 60 year old right-handed male with history of hypertension. Per  chart review patient is married. 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 One level with one-step entry. Presented to [facility] 07/14/2016 with left-sided weakness and facial droop. Blood pressure elevated 170/106. Cranial CT scan negative. MRI of the brain showed acute infarct in the posterior basal ganglia on the right involving the posterior putamen.CTA of the head showed no emergent large vessel occlusion or severe stenosis.Carotid Dopplers with no ICA stenosis. Patient did not receive TPA. Echocardiogram with ejection fraction of 60%. Normal systolic function. Placed on aspirin and Plavix for CVA prophylaxis. Subcutaneous Lovenox for DVT prophylaxis. Physical and occupational therapy evaluations completed with recommendations of  physical medicine rehabilitation consult.Patient was admitted for comprehensive rehabilitation program  Patient's medical record from [facility] has been reviewed by the rehabilitation admission coordinator and physician. NIH Stroke scale: 4 Glascow Coma Scale:  Past Medical History      Past Medical History:  Diagnosis Date  . Bone marrow disease   . Hypertension     Family History   family history is not on file.  Prior Rehab/Hospitalizations Has the patient had major surgery during 100 days prior to admission? No                         Current Medications See MAR  Patients Current Diet:    Soft diet, thin liquids  Precautions / Restrictions Precautions Precautions: Fall Restrictions Weight Bearing Restrictions: No   Has the patient had 2 or more falls or a fall with injury in the past year?No  Prior Activity Level Community (5-7x/wk): Pt. was working full time with [employer].  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  Prior Functional Level Self Care: Did the patient need help  bathing, dressing, using the toilet or eating?  Independent  Indoor Mobility: Did the patient need assistance with walking from room to room (with or without device)? Independent  Stairs: Did the patient need assistance with internal or external stairs (with or without device)? Independent  Functional Cognition: Did the patient need help planning regular tasks such as shopping or remembering to take medications? Independent  Home Assistive Devices / Equipment Home Assistive Devices/Equipment: None Home Equipment: None  Prior Device Use: Indicate devices/aids used by the patient prior to current illness, exacerbation or injury? None    Prior Functional Level Current Functional Level  Bed Mobility Independent Min assist  Transfers Independent Min assist  Mobility - Walk/Wheelchair Independent Mod assist (+2 mod assist)  Upper Body Dressing Independent  (not tested)  Lower Body Dressing Independent Max assist  Grooming Independent Other (set up)  Eating/Drinking Independent Other (set up)  Toilet Transfer Independent Min assist  Bladder Continence  continent using urinal, continent  Bowel Management continent last BM 07/13/16, continent  Stair Climbing  independent  (not tested)  Communication WNL WNL  Memory WNL WNL  Cooking/Meal Prep shares responsibilities with wife     Housework shares responsibility with wife   Money Management independent   Driving  independent     Special needs/care consideration BiPAP/CPAP  Wife reports pt. Snores but has not been diagnosed with sleep apnea CPM   no Continuous Drip IV   no Dialysis  no        Life Vest   no Oxygen   no Special Bed   no Trach Size   no Wound Vac (area)   no       Skin  WDL per nursing assessment                               Bowel mgmt:    Last BM 07/13/16 , continent Bladder mgmt: continent Diabetic mgmt n/a  Previous Home Environment Living Arrangements: Spouse/significant other Available Help at  Discharge: Family Type of Home: House Home Layout: One level Home Access: Stairs to enter Entrance Stairs-Rails: None Entrance Stairs-Number of Steps: 1 Bathroom Shower/Tub: Art gallery manager: Yes Home Care Services: No  Discharge  Living Setting Plans for Discharge Living Setting: Patient's home Type of Home at Discharge: House Discharge Home Layout: One level Discharge Home Access: Stairs to enter Entrance Stairs-Rails: None Entrance Stairs-Number of Steps: 1 Discharge Bathroom Shower/Tub: Walk-in shower Discharge Bathroom Toilet: Handicapped height Discharge Bathroom Accessibility: Yes How Accessible: Accessible via walker Does the patient have any problems obtaining your medications?: No  Social/Family/Support Systems Patient Roles: Spouse, Parent Anticipated Caregiver: wife, Giovoni Hefferon Anticipated Caregiver's Contact Information: [phone] (home); wife is carrying pt's work phone [phone] Ability/Limitations of Caregiver: Wife works for [employer] and states she has 200 days in her bank of days off.  Anticipates she will be able to take as much time off as needed to care for her husband Caregiver Availability: 24/7 Discharge Plan Discussed with Primary Caregiver: Yes Is Caregiver In Agreement with Plan?: Yes Does Caregiver/Family have Issues with Lodging/Transportation while Pt is in Rehab?: No  Goals/Additional Needs Patient/Family Goal for Rehab: supervision and min assist PT and OT; n/a SLP Expected length of stay: 19-21 days Cultural Considerations: none Dietary Needs: soft diet, thin liquids Equipment Needs: TBA Special Service Needs: n/a Pt/Family Agrees to Admission and willing to participate: Yes Program Orientation Provided & Reviewed with Pt/Caregiver Including Roles  & Responsibilities: Yes  Patient Condition: Patient was admitted to [facility] with a right basal ganglia CVA.  He has been receiving PT/OT services.   Therapies are recommending inpatient rehab admission.  Patient will benefit from and tolerate 3 hours of therapy a day preparing him for home with wife.  The above assessment and information have been reviewed  with Dr. Naaman Plummer.  I have approval from Dr. Naaman Plummer for acute inpatient rehabilitation admission this date.    Preadmission Screen Completed By:  Gerlean Ren, 07/16/2016 10:42 AM ______________________________________________________________________   Discussed status with Dr.  Naaman Plummer  on  07/16/16  at  1134  and received telephone approval for admission today.  Admission Coordinator:  Gerlean Ren, time  [id number] /Date  07/16/16   Assessment/Plan: Diagnosis: right basal ganglia infarct 1. Does the need for close, 24 hr/day  Medical supervision in concert with the patient's rehab needs make it unreasonable for this patient to be served in a less intensive setting? Yes 2. Co-Morbidities requiring supervision/potential complications: HTN, bone marrow disease 3. Due to bladder management, bowel management, safety, skin/wound care, disease management, medication administration, pain management and patient education, does the patient require 24 hr/day rehab nursing? Yes 4. Does the patient require coordinated care of a physician, rehab nurse, PT (1-2 hrs/day, 5 days/week) and OT (1-2 hrs/day, 5 days/week) to address physical and functional deficits in the context of the above medical diagnosis(es)? Yes Addressing deficits in the following areas: balance, endurance, locomotion, strength, transferring, bowel/bladder control, bathing, dressing, feeding, grooming and toileting 5. Can the patient actively participate in an intensive therapy program of at least 3 hrs of therapy 5 days a week? Yes 6. The potential for patient to make measurable gains while on inpatient rehab is excellent 7. Anticipated functional outcomes upon discharge from inpatients are: supervision and min assist PT,  supervision and min assist OT, n/a SLP 8. Estimated rehab length of stay to reach the above functional goals is: 19-21 days 9. Does the patient have adequate social supports to accommodate these discharge functional goals? Yes 10. Anticipated D/C setting: Home 11. Anticipated post D/C treatments: [place] therapy 12. Overall Rehab/Functional Prognosis: excellent    RECOMMENDATIONS: This patient's condition is appropriate for continued rehabilitative care in the following  setting: CIR Patient has agreed to participate in recommended program. Yes Note that insurance prior authorization may be required for reimbursement for recommended care.  Comment: admit to CIR today  Meredith Staggers, MD, [place] Physical Medicine & Rehabilitation 07/16/2016   Gerlean Ren 07/16/2016    Revision History

## 2016-07-16 NOTE — Consult Note (Signed)
Referring Physician: Dr Doy Hutching     Chief Complaint: L sided numbness   HPI: Jason Oconnor is an 60 y.o. male with a past medical history significant for HTN admitted with 1 day history of left-sided weakness and left facial numbness. Came to ER where BP was elevated but head CT unremarkable.   Symptoms much improved and pt is close to baseline.   On ASA 81 at home  Date last known well: Date: 07/13/2016 Time last known well: Unable to determine tPA Given: No: 1 day out of symptoms and current NIHSS is 1   Today Pt has increased weakness in his LUE and LLE from yesterday.  MRI consistent with stroke.    Past Medical History:  Diagnosis Date  . Bone marrow disease   . Hypertension     History reviewed. No pertinent surgical history.  History reviewed. No pertinent family history. Social History:  reports that he has quit smoking. He has never used smokeless tobacco. He reports that he drinks alcohol. He reports that he does not use drugs.  Allergies:  Allergies  Allergen Reactions  . Lipitor [Atorvastatin] Other (See Comments)    Blood clots.  . Lisinopril     Other reaction(s): Dizziness  . Losartan     Other reaction(s): Dizziness    Medications: I have reviewed the patient's current medications.  ROS: History obtained from the patient  General ROS: negative for - chills, fatigue, fever, night sweats, weight gain or weight loss Psychological ROS: negative for - behavioral disorder, hallucinations, memory difficulties, mood swings or suicidal ideation Ophthalmic ROS: negative for - blurry vision, double vision, eye pain or loss of vision ENT ROS: negative for - epistaxis, nasal discharge, oral lesions, sore throat, tinnitus or vertigo Allergy and Immunology ROS: negative for - hives or itchy/watery eyes Hematological and Lymphatic ROS: negative for - bleeding problems, bruising or swollen lymph nodes Endocrine ROS: negative for - galactorrhea, hair pattern changes,  polydipsia/polyuria or temperature intolerance Respiratory ROS: negative for - cough, hemoptysis, shortness of breath or wheezing Cardiovascular ROS: negative for - chest pain, dyspnea on exertion, edema or irregular heartbeat Gastrointestinal ROS: negative for - abdominal pain, diarrhea, hematemesis, nausea/vomiting or stool incontinence Genito-Urinary ROS: negative for - dysuria, hematuria, incontinence or urinary frequency/urgency Musculoskeletal ROS: negative for - joint swelling or muscular weakness Neurological ROS: as noted in HPI Dermatological ROS: negative for rash and skin lesion changes  Physical Examination: Blood pressure (!) 151/102, pulse 80, temperature 97.6 F (36.4 C), temperature source Oral, resp. rate 18, height 6' (1.829 m), weight 102.2 kg (225 lb 4.8 oz), SpO2 97 %.  HEENT-  Normocephalic, no lesions, without obvious abnormality.  Normal external eye and conjunctiva.  Normal TM's bilaterally.  Normal auditory canals and external ears. Normal external nose, mucus membranes and septum.  Normal pharynx. Cardiovascular- regular rate and rhythm, S1, S2 normal, no murmur, click, rub or gallop, pulses palpable throughout   Lungs- Heart exam - S1, S2 normal, no murmur, no gallop, rate regular Abdomen- soft, non-tender; bowel sounds normal; no masses,  no organomegaly Extremities- less then 2 second capillary refill Lymph-no adenopathy palpable Musculoskeletal-no joint tenderness, deformity or swelling Skin-warm and dry, no hyperpigmentation, vitiligo, or suspicious lesions  Neurological Examination Mental Status: Alert, oriented, thought content appropriate.  Speech fluent without evidence of aphasia.  Able to follow 3 step commands without difficulty. Cranial Nerves: II: Discs flat bilaterally; Visual fields grossly normal, pupils equal, round, reactive to light and accommodation III,IV, VI: ptosis not  present, extra-ocular motions intact bilaterally V,VII: smile  symmetric, facial light touch sensation normal bilaterally VIII: hearing normal bilaterally IX,X: gag reflex present XI: bilateral shoulder shrug XII: midline tongue extension Motor: Right : Upper extremity   5/5    Left:     Upper extremity   4/5  Lower extremity   5/5     Lower extremity   4/5 Tone and bulk:normal tone throughout; no atrophy noted Sensory: Pinprick and light touch intact throughout, bilaterally Deep Tendon Reflexes: 2+ and symmetric throughout Plantars: Right: downgoing   Left: downgoing Cerebellar: normal finger-to-nose, normal rapid alternating movements and normal heel-to-shin test Gait: not tested       Laboratory Studies:  Basic Metabolic Panel:  Recent Labs Lab 07/14/16 0935 07/15/16 0542  NA 136 138  K 4.3 4.0  CL 107 109  CO2 26 23  GLUCOSE 101* 97  BUN 18 17  CREATININE 0.90 0.87  CALCIUM 9.5 9.0    Liver Function Tests:  Recent Labs Lab 07/14/16 0935 07/15/16 0542  AST 29 22  ALT 23 20  ALKPHOS 56 49  BILITOT 0.6 0.9  PROT 8.2* 7.5  ALBUMIN 4.5 4.0   No results for input(s): LIPASE, AMYLASE in the last 168 hours. No results for input(s): AMMONIA in the last 168 hours.  CBC:  Recent Labs Lab 07/14/16 0935 07/15/16 0542  WBC 5.9 6.4  NEUTROABS 3.2  --   HGB 14.7 14.0  HCT 42.1 40.4  MCV 87.2 87.7  PLT 233 214    Cardiac Enzymes:  Recent Labs Lab 07/14/16 0935  TROPONINI <0.03    BNP: Invalid input(s): POCBNP  CBG: No results for input(s): GLUCAP in the last 168 hours.  Microbiology: No results found for this or any previous visit.  Coagulation Studies: No results for input(s): LABPROT, INR in the last 72 hours.  Urinalysis:   Recent Labs Lab 07/14/16 1220  COLORURINE STRAW*  LABSPEC 1.009  PHURINE 6.0  GLUCOSEU NEGATIVE  HGBUR NEGATIVE  BILIRUBINUR NEGATIVE  KETONESUR NEGATIVE  PROTEINUR NEGATIVE  NITRITE NEGATIVE  LEUKOCYTESUR NEGATIVE    Lipid Panel:    Component Value Date/Time    CHOL 205 (H) 07/15/2016 0542   TRIG 139 07/15/2016 0542   HDL 39 (L) 07/15/2016 0542   CHOLHDL 5.3 07/15/2016 0542   VLDL 28 07/15/2016 0542   LDLCALC 138 (H) 07/15/2016 0542    HgbA1C: No results found for: HGBA1C  Urine Drug Screen:  No results found for: LABOPIA, COCAINSCRNUR, LABBENZ, AMPHETMU, THCU, LABBARB  Alcohol Level: No results for input(s): ETH in the last 168 hours.  Other results: EKG: normal EKG, normal sinus rhythm, unchanged from previous tracings.  Imaging: Ct Angio Head W Or Wo Contrast  Result Date: 07/15/2016 CLINICAL DATA:  LEFT-sided weakness, follow-up RIGHT basal ganglia stroke 1 day ago. History of hypertension. EXAM: CT ANGIOGRAPHY HEAD TECHNIQUE: Multidetector CT imaging of the head was performed using the standard protocol during bolus administration of intravenous contrast. Multiplanar CT image reconstructions and MIPs were obtained to evaluate the vascular anatomy. CONTRAST:  75 cc Isovue 370 COMPARISON:  MRI head July 15, 2016 at 0835 hours and CT HEAD July 14, 2016 FINDINGS: CT HEAD BRAIN: The ventricles and sulci are normal. Faint hypodensities RIGHT basal ganglia corresponding to known infarcts. No intraparenchymal hemorrhage, mass effect nor midline shift. No acute large vascular territory infarcts. No abnormal extra-axial fluid collections. Basal cisterns are patent. VASCULAR: Unremarkable. SKULL/SOFT TISSUES: No skull fracture. No significant soft tissue swelling. ORBITS/SINUSES:  The included ocular globes and orbital contents are normal.The mastoid aircells and included paranasal sinuses are well-aerated. OTHER: None. CTA HEAD ANTERIOR CIRCULATION: Normal appearance of the cervical internal carotid arteries, petrous, cavernous and supra clinoid internal carotid arteries. Widely patent anterior communicating artery. Normal appearance of the anterior and middle cerebral arteries. No large vessel occlusion, hemodynamically significant stenosis, dissection,  luminal irregularity, contrast extravasation or aneurysm. Mild dolichoectasia. POSTERIOR CIRCULATION: RIGHT vertebral artery is dominant with normal appearance of the vertebral arteries, vertebrobasilar junction and basilar artery, as well as main branch vessels. Normal appearance of the posterior cerebral arteries. Fetal origin RIGHT posterior cerebral artery. No large vessel occlusion, hemodynamically significant stenosis, dissection, luminal irregularity, contrast extravasation or aneurysm. Mild dolichoectasia. VENOUS SINUSES: Major dural venous sinuses are patent though not tailored for evaluation on this angiographic examination. ANATOMIC VARIANTS: None. DELAYED PHASE: No abnormal intracranial enhancement. IMPRESSION: CT HEAD: Evolving nonhemorrhagic RIGHT basal ganglia infarct. Otherwise negative CT HEAD. CTA HEAD:  No emergent large vessel occlusion or severe stenosis. Mild dolichoectasia associated with chronic hypertension. Electronically Signed   By: Elon Alas M.D.   On: 07/15/2016 16:23   Mr Brain Wo Contrast  Result Date: 07/15/2016 CLINICAL DATA:  Left-sided weakness.  Left facial droop EXAM: MRI HEAD WITHOUT CONTRAST TECHNIQUE: Multiplanar, multiecho pulse sequences of the brain and surrounding structures were obtained without intravenous contrast. COMPARISON:  CT head 07/14/2016 FINDINGS: Acute infarct in the right posterior putamen. No other areas of acute infarction Minimal chronic microvascular ischemic changes in the white matter. No cortical infarct. Ventricle size normal.  Cerebral volume normal. Negative for hemorrhage or fluid collection. Negative for mass or edema.  No shift of the midline structures. Circle of Willis patent. Normal orbital structures. Mild mucosal thickening in the paranasal sinuses. Pituitary normal in size IMPRESSION: Acute infarct in the posterior basal ganglia on the right involving the posterior putamen. Electronically Signed   By: Franchot Gallo M.D.   On:  07/15/2016 09:07    Assessment: 60 y.o. male male with a past medical history significant for HTN admitted with 1 day history of left-sided weakness and left facial numbness. Came to ER where BP was elevated but head CT unremarkable.  Worsening exam with increased L sided weakness  On ASA 81 at home   Stroke Risk Factors - hypertension   MRI shows R putamen/BG ischemic infarct currently with evoluation.   Plan:  D/c planning to rehab today Plavix daily  D/w pts family and pt.     Leotis Pain  07/16/2016, 2:34 PM

## 2016-07-16 NOTE — Discharge Instructions (Signed)
Ischemic Stroke Treated Without Warfarin °An ischemic stroke is the sudden death of brain tissue. Blood carries oxygen to all areas of your body. This type of stroke happens when your blood does not flow to your brain like normal. Your brain cannot get the oxygen it needs. This is an emergency. °Symptoms of any stroke usually happen all of the sudden. You may notice them when you wake up. They can include sudden: °· Weakness or loss of feeling in your face, arm, or leg, especially on one side of your body. °· Confusion. °· Trouble talking or understanding. °· Trouble seeing. °· Trouble walking or moving your arms or legs. °· Dizziness. °· Loss of balance or coordination. °· Severe headache with no known cause. °Get help as soon as any of these problems start. This is important so your doctor can treat you right away with:  °· Medicines that dissolve blood clots. °· A device to remove a blood clot. °A few hours after the start of your symptoms, your doctor may treat you with: °· Oxygen. °· Medicines. °· A surgery to widen blood vessels. °RISK FACTORS  °Risk factors are things that make it more likely for you to have a stroke. These include: °· High blood pressure. °· High cholesterol. °· Diabetes. °· Heart disease. °· Having a buildup of fatty deposits in the blood vessels. °· Having an abnormal heart rhythm (atrial fibrillation). °· Being very overweight (obese). °· Smoking. °· Taking birth control pills, especially if you smoke. °· Not being active. °· Having a diet that is high in fats, salt, and calories. °· Drinking too much alcohol. °· Using illegal drugs. °· Being African American. °· Being over the age of 55. °· Having a family history of stroke. °· Having a history of blood clots, stroke, warning stroke (transient ischemic attack, TIA), or heart attack. °· Sickle cell disease. °HOME CARE °· Take all medicines exactly as told by your doctor. Understand all your medicine instructions. °· If you are able to  swallow, eat healthy foods. Eat 5 or more servings of fruits and vegetables each day. Eat soft foods or pureed foods, or eat small bites of food so you do not choke. °· Stay active. Try to get at least 30 minutes of activity on most or all days. °· Do not smoke. °· Limit or stop alcohol use. °· Keep your home safe so you do not fall. Try: °¨ Putting grab bars in the bedroom and bathroom. °¨ Raising toilet seats. °¨ Putting a seat in the shower. °· Go to physical, occupational, and speech therapy sessions as told by your doctor. °· Use a walker or a cane at all times if told to do so. °· Keep all doctor visits as told. °GET HELP RIGHT AWAY IF:  °· You have sudden weakness or loss of feeling in your face, arm, or leg, especially on one side of your body. °· You are suddenly confused. °· You have trouble talking or understanding. °· You have sudden trouble seeing. °· You have sudden trouble walking or moving your arms or legs. °· You have sudden dizziness. °· You lose your balance or your movements are not smooth. °· You have a sudden, severe headache with no known cause. °· You are partly unaware or totally unaware of what is going on around you. °Any of these symptoms may be a sign of an emergency. Do not wait to see if the symptoms go away. Call for help (911 in [place] Do not   drive yourself to the hospital. °  °This information is not intended to replace advice given to you by your health care provider. Make sure you discuss any questions you have with your health care provider. °  °Document Released: 08/26/2014 Document Reviewed: 08/26/2014 °Elsevier Interactive Patient Education ©2016 [employer] ° ° °

## 2016-07-16 NOTE — Progress Notes (Signed)
Pt transferred to Rehab from [place] and orientated x 4. Orientated to Rehab expectations, schedule, goals, etc. Diagnostic specific information provided.

## 2016-07-16 NOTE — H&P (View-Only) (Signed)
Physical Medicine and Rehabilitation Admission H&P    Chief Complaint  Patient presents with  . Weakness  : HPI: 60 year old right-handed male with history of hypertension. Per chart review patient is married. Independent prior to admission working for [employer]. One level with one-step entry. Presented to [facility] 07/14/2016 with left-sided weakness and facial droop. Blood pressure elevated 170/106. Cranial CT scan negative. 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 CTA of the head showed no emergent large vessel occlusion or severe stenosis. Carotid Dopplers with no ICA stenosis. Patient did not receive TPA. Echocardiogram with ejection fraction of 60%. Normal systolic function. Placed on aspirin and Plavix for CVA prophylaxis. Subcutaneous Lovenox for DVT prophylaxis. Physical and occupational therapy evaluations completed with recommendations of physical medicine rehabilitation consult. Patient was admitted for comprehensive rehabilitation program  Review of Systems  Constitutional: Negative for chills and fever.       Left side incoordination  HENT: Negative for hearing loss.        Occasional headaches  Eyes: Negative for blurred vision and double vision.  Respiratory: Negative for cough and shortness of breath.   Cardiovascular: Negative for chest pain, palpitations and leg swelling.  Gastrointestinal: Positive for constipation. Negative for nausea and vomiting.       GERD  Genitourinary: Negative for dysuria, flank pain and hematuria.  Musculoskeletal: Positive for myalgias. Negative for falls.  Skin: Negative for rash.  Neurological: Negative for seizures and loss of consciousness.  All other systems reviewed and are negative.  Past Medical History:  Diagnosis Date  . Bone marrow disease   . Hypertension    History reviewed. No pertinent surgical history. History reviewed.  No pertinent family history. Social History:  reports that he has quit smoking. He has never used smokeless tobacco. He reports that he drinks alcohol. He reports that he does not use drugs. Allergies:  Allergies  Allergen Reactions  . Lipitor [Atorvastatin] Other (See Comments)    Blood clots.  . Lisinopril     Other reaction(s): Dizziness  . Losartan     Other reaction(s): Dizziness   Medications Prior to Admission  Medication Sig Dispense Refill  . amLODipine (NORVASC) 2.5 MG tablet Take 2.5 mg by mouth every evening.    Marland Kitchen aspirin EC 81 MG tablet Take 81 mg by mouth every evening.    . Cholecalciferol (VITAMIN D3) 1000 units CAPS Take 1,000 Units by mouth every morning.    . fluocinonide (LIDEX) 0.05 % external solution Apply 1 application topically 2 (two) times daily as needed. For eczema.    Marland Kitchen omeprazole (PRILOSEC) 20 MG capsule Take 20 mg by mouth daily as needed for heartburn or indigestion.      Home: Home Living Family/patient expects to be discharged to:: Private residence Living Arrangements: Spouse/significant other Available Help at Discharge: Family Type of Home: House Home Access: Stairs to enter Technical brewer of Steps: 1 Entrance Stairs-Rails: None Home Layout: One level Bathroom Accessibility: Yes Home Equipment: None   Functional History: Prior Function Level of Independence: Independent Comments: Pt was independent with all household and community ambulation. Independent with ADLs.  Functional Status:  Mobility: Bed Mobility Overal bed mobility: Needs Assistance Bed Mobility: Supine to Sit Supine to sit: Min assist General bed mobility comments: Pt able to swing LE off of bed. Unable to push through L UE to assist with lifting trunk off of bed due to  weakness, min assist. Pt able to scoot to EOB with no assist, primarily using R UE and LE. Transfers Overall transfer level: Needs assistance Equipment used: None Transfers: Sit to/from  Stand Sit to Stand: Min assist General transfer comment: Min assist for sit/stand, demonstrates safe technique. Unsteadiness noted due to L LE weakness, relies on R LE to maintain standing balance. Ambulation/Gait Ambulation/Gait assistance: Mod assist, +2 safety/equipment Ambulation Distance (Feet): 3 Feet Assistive device: Rolling walker (2 wheeled) Gait Pattern/deviations: Step-to pattern, Decreased step length - left, Decreased stride length, Decreased dorsiflexion - left General Gait Details: Pt requires +2 (for safety) mod assist with ambulation with RW. Decreased L hip flexion and L ankle doriflexion, decreased motor control. Increased L lateral lean due to L LE giving way, mod assist to maintain balance.  Gait velocity interpretation: Below normal speed for age/gender    ADL: ADL Overall ADL's : Needs assistance/impaired Eating/Feeding: Set up (Pt. is able to use his RUE, however requires hand-over hand assist for LUE use.) Grooming: Set up (Pt. requires hand-over hand assist to use her LUE to assist.) Lower Body Dressing: Maximal assistance Functional mobility during ADLs: Minimal assistance General ADL Comments: Pt. has difficulty using his left hand to complete light self care tasks.  Cognition: Cognition Overall Cognitive Status: Within Functional Limits for tasks assessed Orientation Level: Oriented X4 Cognition Arousal/Alertness: Awake/alert Behavior During Therapy: WFL for tasks assessed/performed Overall Cognitive Status: Within Functional Limits for tasks assessed  Physical Exam: Blood pressure (!) 142/89, pulse 78, temperature 98 F (36.7 C), temperature source Oral, resp. rate 18, height 6' (1.829 m), weight 101.2 kg (223 lb), SpO2 97 %. Physical Exam  Constitutional: He is oriented to person, place, and time. He appears well-developed.  HENT:  Mild left facial droop  Eyes: EOM are normal.  Neck: Normal range of motion. Neck supple. No thyromegaly present.   Cardiovascular: Normal rate and regular rhythm.   Respiratory: Effort normal and breath sounds normal. No respiratory distress.  GI: Soft. Bowel sounds are normal. He exhibits no distension.  Neurological: He is alert and oriented to person, place, and time.   Makes good eye contact with examiner. Follow full commands. Fair awareness of deficits. Mild dysarthria and left central 7. Speech intelligible. Intact cough. RUE and RLE 5/5. LUE 2+ delt, bicep, 2-tricep, 1- WE, tr finger ext. LLE: 2/5 HF, KE and trace APF, absent ADF. Sensation intact except for distal fingers which show sl decrease in light touch. DTR's 2+ left and 1+ right.   Skin: Skin is warm and dry.  Psychiatric: He has a normal mood and affect. His behavior is normal. Judgment and thought content normal.    Results for orders placed or performed during the hospital encounter of 07/14/16 (from the past 48 hour(s))  CBC with Differential     Status: None   Collection Time: 07/14/16  9:35 AM  Result Value Ref Range   WBC 5.9 3.8 - 10.6 K/uL   RBC 4.82 4.40 - 5.90 MIL/uL   Hemoglobin 14.7 13.0 - 18.0 g/dL   HCT 42.1 40.0 - 52.0 %   MCV 87.2 80.0 - 100.0 fL   MCH 30.4 26.0 - 34.0 pg   MCHC 34.9 32.0 - 36.0 g/dL   RDW 13.6 11.5 - 14.5 %   Platelets 233 150 - 440 K/uL   Neutrophils Relative % 54 %   Neutro Abs 3.2 1.4 - 6.5 K/uL   Lymphocytes Relative 33 %   Lymphs Abs 2.0 1.0 - 3.6  K/uL   Monocytes Relative 8 %   Monocytes Absolute 0.5 0.2 - 1.0 K/uL   Eosinophils Relative 4 %   Eosinophils Absolute 0.2 0 - 0.7 K/uL   Basophils Relative 1 %   Basophils Absolute 0.1 0 - 0.1 K/uL  Comprehensive metabolic panel     Status: Abnormal   Collection Time: 07/14/16  9:35 AM  Result Value Ref Range   Sodium 136 135 - 145 mmol/L   Potassium 4.3 3.5 - 5.1 mmol/L   Chloride 107 101 - 111 mmol/L   CO2 26 22 - 32 mmol/L   Glucose, Bld 101 (H) 65 - 99 mg/dL   BUN 18 6 - 20 mg/dL   Creatinine, Ser 0.90 0.61 - 1.24 mg/dL   Calcium  9.5 8.9 - 10.3 mg/dL   Total Protein 8.2 (H) 6.5 - 8.1 g/dL   Albumin 4.5 3.5 - 5.0 g/dL   AST 29 15 - 41 U/L   ALT 23 17 - 63 U/L   Alkaline Phosphatase 56 38 - 126 U/L   Total Bilirubin 0.6 0.3 - 1.2 mg/dL   GFR calc non Af Amer >60 >60 mL/min   GFR calc Af Amer >60 >60 mL/min    Comment: (NOTE) The eGFR has been calculated using the CKD EPI equation. This calculation has not been validated in all clinical situations. eGFR's persistently <60 mL/min signify possible Chronic Kidney Disease.    Anion gap 3 (L) 5 - 15  Troponin I     Status: None   Collection Time: 07/14/16  9:35 AM  Result Value Ref Range   Troponin I <0.03 <0.03 ng/mL  Urinalysis complete, with microscopic     Status: Abnormal   Collection Time: 07/14/16 12:20 PM  Result Value Ref Range   Color, Urine STRAW (A) YELLOW   APPearance CLEAR (A) CLEAR   Glucose, UA NEGATIVE NEGATIVE mg/dL   Bilirubin Urine NEGATIVE NEGATIVE   Ketones, ur NEGATIVE NEGATIVE mg/dL   Specific Gravity, Urine 1.009 1.005 - 1.030   Hgb urine dipstick NEGATIVE NEGATIVE   pH 6.0 5.0 - 8.0   Protein, ur NEGATIVE NEGATIVE mg/dL   Nitrite NEGATIVE NEGATIVE   Leukocytes, UA NEGATIVE NEGATIVE   RBC / HPF 0-5 0 - 5 RBC/hpf   WBC, UA 0-5 0 - 5 WBC/hpf   Bacteria, UA NONE SEEN NONE SEEN   Squamous Epithelial / LPF NONE SEEN NONE SEEN  Comprehensive metabolic panel     Status: None   Collection Time: 07/15/16  5:42 AM  Result Value Ref Range   Sodium 138 135 - 145 mmol/L   Potassium 4.0 3.5 - 5.1 mmol/L   Chloride 109 101 - 111 mmol/L   CO2 23 22 - 32 mmol/L   Glucose, Bld 97 65 - 99 mg/dL   BUN 17 6 - 20 mg/dL   Creatinine, Ser 0.87 0.61 - 1.24 mg/dL   Calcium 9.0 8.9 - 10.3 mg/dL   Total Protein 7.5 6.5 - 8.1 g/dL   Albumin 4.0 3.5 - 5.0 g/dL   AST 22 15 - 41 U/L   ALT 20 17 - 63 U/L   Alkaline Phosphatase 49 38 - 126 U/L   Total Bilirubin 0.9 0.3 - 1.2 mg/dL   GFR calc non Af Amer >60 >60 mL/min   GFR calc Af Amer >60 >60 mL/min      Comment: (NOTE) The eGFR has been calculated using the CKD EPI equation. This calculation has not been validated in all clinical  situations. eGFR's persistently <60 mL/min signify possible Chronic Kidney Disease.    Anion gap 6 5 - 15  CBC     Status: None   Collection Time: 07/15/16  5:42 AM  Result Value Ref Range   WBC 6.4 3.8 - 10.6 K/uL   RBC 4.60 4.40 - 5.90 MIL/uL   Hemoglobin 14.0 13.0 - 18.0 g/dL   HCT 40.4 40.0 - 52.0 %   MCV 87.7 80.0 - 100.0 fL   MCH 30.5 26.0 - 34.0 pg   MCHC 34.8 32.0 - 36.0 g/dL   RDW 13.5 11.5 - 14.5 %   Platelets 214 150 - 440 K/uL  Lipid panel     Status: Abnormal   Collection Time: 07/15/16  5:42 AM  Result Value Ref Range   Cholesterol 205 (H) 0 - 200 mg/dL   Triglycerides 139 <150 mg/dL   HDL 39 (L) >40 mg/dL   Total CHOL/HDL Ratio 5.3 RATIO   VLDL 28 0 - 40 mg/dL   LDL Cholesterol 138 (H) 0 - 99 mg/dL    Comment:        Total Cholesterol/HDL:CHD Risk Coronary Heart Disease Risk Table                     Men   Women  1/2 Average Risk   3.4   3.3  Average Risk       5.0   4.4  2 X Average Risk   9.6   7.1  3 X Average Risk  23.4   11.0        Use the calculated Patient Ratio above and the CHD Risk Table to determine the patient's CHD Risk.        ATP III CLASSIFICATION (LDL):  <100     mg/dL   Optimal  100-129  mg/dL   Near or Above                    Optimal  130-159  mg/dL   Borderline  160-189  mg/dL   High  >190     mg/dL   Very High    Ct Head Wo Contrast  Result Date: 07/14/2016 CLINICAL DATA:  Left-sided weakness and left-sided facial droop EXAM: CT HEAD WITHOUT CONTRAST TECHNIQUE: Contiguous axial images were obtained from the base of the skull through the vertex without intravenous contrast. COMPARISON:  None. FINDINGS: Brain: The ventricles are normal in size and configuration. There is no intracranial mass, hemorrhage, extra-axial fluid collection, or midline shift. No acute infarct is evident. Gray-white  compartments appear within normal limits. Vascular: There is no hyperdense vessel. There is a small focus of calcification in the right carotid siphon. Skull: The bony calvarium appears intact. Sinuses/Orbits: Visualized paranasal sinuses are clear. There is slight rightward deviation the nasal septum. Orbits appear symmetric bilaterally. Other: Mastoid air cells are clear. IMPRESSION: No intracranial mass, hemorrhage, or focal gray - white compartment lesion. Minimal calcification in the right carotid siphon. Rightward deviation of the nasal septum. Electronically Signed   By: Lowella Grip III M.D.   On: 07/14/2016 09:57   Mr Brain Wo Contrast  Result Date: 07/15/2016 CLINICAL DATA:  Left-sided weakness.  Left facial droop EXAM: MRI HEAD WITHOUT CONTRAST TECHNIQUE: Multiplanar, multiecho pulse sequences of the brain and surrounding structures were obtained without intravenous contrast. COMPARISON:  CT head 07/14/2016 FINDINGS: Acute infarct in the right posterior putamen. No other areas of acute infarction Minimal chronic microvascular ischemic changes in  the white matter. No cortical infarct. Ventricle size normal.  Cerebral volume normal. Negative for hemorrhage or fluid collection. Negative for mass or edema.  No shift of the midline structures. Circle of Willis patent. Normal orbital structures. Mild mucosal thickening in the paranasal sinuses. Pituitary normal in size IMPRESSION: Acute infarct in the posterior basal ganglia on the right involving the posterior putamen. Electronically Signed   By: Franchot Gallo M.D.   On: 07/15/2016 09:07   US Carotid Bilateral  Result Date: 07/14/2016 CLINICAL DATA:  Left-sided weakness and facial droop EXAM: BILATERAL CAROTID DUPLEX ULTRASOUND TECHNIQUE: Pearline Cables scale imaging, color Doppler and duplex ultrasound were performed of bilateral carotid and vertebral arteries in the neck. COMPARISON:  None. FINDINGS: Criteria: Quantification of carotid stenosis is based  on velocity parameters that correlate the residual internal carotid diameter with NASCET-based stenosis levels, using the diameter of the distal internal carotid lumen as the denominator for stenosis measurement. The following peak systolic velocity measurements were obtained: RIGHT ICA:  56 cm/sec CCA:  96 cm/sec SYSTOLIC ICA/CCA RATIO:  0.6 DIASTOLIC ICA/CCA RATIO:  0.8 ECA:  68 cm/sec LEFT ICA:  60 cm/sec CCA:  83 cm/sec SYSTOLIC ICA/CCA RATIO:  0.7 DIASTOLIC ICA/CCA RATIO:  1.0 ECA:  78 cm/sec RIGHT CAROTID ARTERY: Real-time interrogation of the right carotid distribution reveals mild generalized intimal thickening without appreciable vessel narrowing. RIGHT VERTEBRAL ARTERY: Flow in the right vertebral artery is antegrade. LEFT CAROTID ARTERY: Real-time interrogation of the left carotid distribution shows mild generalized intimal thickening. There is mild plaque formation in the mid the distal common carotid artery causing approximately 20% diameter stenosis. There is rather minimal plaque in the bifurcation region on the left. No stenosis approaching 50% diameter is seen. LEFT VERTEBRAL ARTERY: Flow in the left vertebral artery is antegrade. IMPRESSION: Mild plaque on the left. No evidence by either real-time interrogation or waveform criteria of hemodynamically significant obstructive disease in either carotid distribution. Flow in both vertebral arteries is in the anatomic direction. Electronically Signed   By: Lowella Grip III M.D.   On: 07/14/2016 11:59       Medical Problem List and Plan: 1.  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  DVT Prophylaxis/Anticoagulation: Subcutaneous Lovenox. Monitor platelet counts and any signs of bleeding 3. Pain Management: Tylenol as needed 4. Hypertension. Norvasc 2.5 mg daily. Monitor with increased mobility 5. Neuropsych: This patient is capable of making decisions on his own behalf. 6.  Skin/Wound Care: Routine skin checks upon admit 7. Fluids/Electrolytes/Nutrition: Routine I&O's with follow-up chemistries as needed. Labs done yesterday at [facility] were within normal limits except for elevated cholesterol 8. GERD. Protonix 9. Elevated lipids: apparently has an allergy to lipitor--"clots". Will need to review alternative choices/safety of these meds  -dietary choices and exercise to be reinforced     Post Admission Physician Evaluation: 1. Functional deficits secondary  to right posterior basal ganglia infarct . 2. Patient is admitted to receive collaborative, interdisciplinary care between the physiatrist, rehab nursing staff, and therapy team. 3. Patient's level of medical complexity and substantial therapy needs in context of that medical necessity cannot be provided at a lesser intensity of care such as a SNF. 4. Patient has experienced substantial functional loss from his/her baseline which was documented above under the "Functional History" and "Functional Status" headings.  Judging by the patient's diagnosis, physical exam, and functional history, the patient has potential for functional progress which will result in measurable gains while  on inpatient rehab.  These gains will be of substantial and practical use upon discharge  in facilitating mobility and self-care at the household level. 5. Physiatrist will provide 24 hour management of medical needs as well as oversight of the therapy plan/treatment and provide guidance as appropriate regarding the interaction of the two. 6. 24 hour rehab nursing will assist with bladder management, bowel management, safety, skin/wound care, disease management, medication administration, pain management and patient education  and help integrate therapy concepts, techniques,education, etc. 7. PT will assess and treat for/with: Lower extremity strength, range of motion, stamina, balance, functional mobility, safety, adaptive techniques and  equipment, NMR, orthotics, family education, ego support, community reintegration.   Goals are: mod I. 8. OT will assess and treat for/with: ADL's, functional mobility, safety, upper extremity strength, adaptive techniques and equipment, NMR, orthotics, family education, ego support.   Goals are: mod I. Therapy may proceed with showering this patient. 9. SLP will assess and treat for/with: n/a.  Goals are: n/a. 10. Case Management and Social Worker will assess and treat for psychological issues and discharge planning. 11. Team conference will be held weekly to assess progress toward goals and to determine barriers to discharge. 12. Patient will receive at least 3 hours of therapy per day at least 5 days per week. 13. ELOS: 14-17 days       14. Prognosis:  excellent     Meredith Staggers, MD, [facility] 07/16/2016  07/15/2016

## 2016-07-16 NOTE — Discharge Summary (Signed)
[facility] - [place] [facility]   PATIENT NAME: Jason Oconnor    MR#:  [id number]  DATE OF BIRTH:  [birth year]  DATE OF ADMISSION:  07/14/2016 ADMITTING PHYSICIAN: Marguarite Arbour, MD  DATE OF DISCHARGE: 07/16/2016 to Acute Rehab  PRIMARY CARE PHYSICIAN: SHIPLEY, MICHAEL B, MD    ADMISSION DIAGNOSIS:  CVA (cerebral infarction) [I63.9] Cerebral infarction due to unspecified mechanism [I63.9]  DISCHARGE DIAGNOSIS:  CVA  SECONDARY DIAGNOSIS:   Past Medical History:  Diagnosis Date  . Bone marrow disease   . Hypertension     HOSPITAL COURSE:   1. 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 Patient has left sided weakness more in the distal muscles than proximal. Patient switched from aspirin over to Plavix. The patient has a allergy to Lipitor but has been on Crestor in the past. I will start low-dose Crestor 5 mg daily and see if he tolerates. If he tolerates this can be increased up to 10 mg and then up to 20 mg over the next month. Patient was accepted to acute rehabilitation and will be discharged today to acute rehabilitation. Patient is able to lift his left arm up over his head but incoordination. The patient is not able to move his fingers on his left hand. The patient is able to straight leg raise with his left leg but unable to flex or extend at the left ankle. The patient remained in normal sinus rhythm on telemetry monitoring during the hospital course. Echocardiogram and carotid ultrasound did not show any source of stroke. 2. Essential hypertension continue Norvasc 2.5 mg daily. 3. Hyperlipidemia unspecified. Patient has an allergy to Lipitor. He was on Crestor in the past. I will start a low dose of this and see how he tolerates. 4. History of bone marrow disorder 5. Headache when necessary pain medication. Tylenol can be also used for headache.  DISCHARGE CONDITIONS:   Fair  CONSULTS OBTAINED:  Treatment Team:  Pauletta Browns, MD  DRUG ALLERGIES:   Allergies  Allergen Reactions  . Lipitor [Atorvastatin] Other (See Comments)    Blood clots.  . Lisinopril     Other reaction(s): Dizziness  . Losartan     Other reaction(s): Dizziness    DISCHARGE MEDICATIONS:   Current Discharge Medication List    START taking these medications   Details  acetaminophen (TYLENOL) 325 MG tablet Take 2 tablets (650 mg total) by mouth every 6 (six) hours as needed for mild pain (or Fever >/= 101).    clopidogrel (PLAVIX) 75 MG tablet Take 1 tablet (75 mg total) by mouth daily. Qty: 30 tablet, Refills: 0    HYDROcodone-acetaminophen (NORCO/VICODIN) 5-325 MG tablet Take 1 tablet by mouth every 6 (six) hours as needed for moderate pain. Qty: 10 tablet, Refills: 0    rosuvastatin (CRESTOR) 5 MG tablet Take 1 tablet (5 mg total) by mouth daily at 6 PM. Qty: 30 tablet, Refills: 0      CONTINUE these medications which have NOT CHANGED   Details  amLODipine (NORVASC) 2.5 MG tablet Take 2.5 mg by mouth every evening.    Cholecalciferol (VITAMIN D3) 1000 units CAPS Take 1,000 Units by mouth every morning.    fluocinonide (LIDEX) 0.05 % external solution Apply 1 application topically 2 (two) times daily as needed. For eczema.    omeprazole (PRILOSEC) 20 MG capsule Take 20 mg by mouth daily as needed for heartburn or indigestion.      STOP taking these  medications     aspirin EC 81 MG tablet          DISCHARGE INSTRUCTIONS:    Follow-up doctor at rehabilitation one day  If you experience worsening of your admission symptoms, develop shortness of breath, life threatening emergency, suicidal or homicidal thoughts you must seek medical attention immediately by calling 911 or calling your MD immediately  if symptoms less severe.  You Must read complete instructions/literature along with all the possible adverse reactions/side effects for all the Medicines you take and that have been prescribed to you. Take any  new Medicines after you have completely understood and accept all the possible adverse reactions/side effects.   Please note  You were cared for by a hospitalist during your hospital stay. If you have any questions about your discharge medications or the care you received while you were in the hospital after you are discharged, you can call the unit and asked to speak with the hospitalist on call if the hospitalist that took care of you is not available. Once you are discharged, your primary care physician will handle any further medical issues. Please note that NO REFILLS for any discharge medications will be authorized once you are discharged, as it is imperative that you return to your primary care physician (or establish a relationship with a primary care physician if you do not have one) for your aftercare needs so that they can reassess your need for medications and monitor your lab values.    Today   CHIEF COMPLAINT:   Chief Complaint  Patient presents with  . Weakness    HISTORY OF PRESENT ILLNESS:  Jason Oconnor  is a 60 y.o. male with a known history of Hypertension presented with left-sided weakness.   VITAL SIGNS:  Blood pressure 140/90, pulse 73, temperature 98.2 F (36.8 C), temperature source Oral, resp. rate 18, height 6' (1.829 m), weight 102.2 kg (225 lb 4.8 oz), SpO2 97 %.     PHYSICAL EXAMINATION:  GENERAL:  60 y.o.-year-old patient lying in the bed with no acute distress.  EYES: Pupils equal, round, reactive to light and accommodation. No scleral icterus. Extraocular muscles intact.  HEENT: Head atraumatic, normocephalic. Oropharynx and nasopharynx clear.  NECK:  Supple, no jugular venous distention. No thyroid enlargement, no tenderness.  LUNGS: Normal breath sounds bilaterally, no wheezing, rales,rhonchi or crepitation. No use of accessory muscles of respiration.  CARDIOVASCULAR: S1, S2 normal. No murmurs, rubs, or gallops.  ABDOMEN: Soft, non-tender,  non-distended. Bowel sounds present. No organomegaly or mass.  EXTREMITIES: 1+ edema, no cyanosis, or clubbing.  NEUROLOGIC: Cranial nerves II through XII are intact. Able to lift left arm up over his head but incoordination didn't doing so. He does have some grip strength with his left hand but unable to coordinate his finger movements with rapid finger movements on his left hand. He is able to straight leg raise with his left leg but unable to flex or extend at his ankle.  PSYCHIATRIC: The patient is alert and oriented x 3.  SKIN: No obvious rash, lesion, or ulcer.   DATA REVIEW:   CBC  Recent Labs Lab 07/15/16 0542  WBC 6.4  HGB 14.0  HCT 40.4  PLT 214    Chemistries   Recent Labs Lab 07/15/16 0542  NA 138  K 4.0  CL 109  CO2 23  GLUCOSE 97  BUN 17  CREATININE 0.87  CALCIUM 9.0  AST 22  ALT 20  ALKPHOS 49  BILITOT 0.9  Cardiac Enzymes  Recent Labs Lab 07/14/16 0935  TROPONINI <0.03     RADIOLOGY:  Ct Angio Head W Or Wo Contrast  Result Date: 07/15/2016 CLINICAL DATA:  LEFT-sided weakness, follow-up RIGHT basal ganglia stroke 1 day ago. History of hypertension. EXAM: CT ANGIOGRAPHY HEAD TECHNIQUE: Multidetector CT imaging of the head was performed using the standard protocol during bolus administration of intravenous contrast. Multiplanar CT image reconstructions and MIPs were obtained to evaluate the vascular anatomy. CONTRAST:  75 cc Isovue 370 COMPARISON:  MRI head July 15, 2016 at 0835 hours and CT HEAD July 14, 2016 FINDINGS: CT HEAD BRAIN: The ventricles and sulci are normal. Faint hypodensities RIGHT basal ganglia corresponding to known infarcts. No intraparenchymal hemorrhage, mass effect nor midline shift. No acute large vascular territory infarcts. No abnormal extra-axial fluid collections. Basal cisterns are patent. VASCULAR: Unremarkable. SKULL/SOFT TISSUES: No skull fracture. No significant soft tissue swelling. ORBITS/SINUSES: The included  ocular globes and orbital contents are normal.The mastoid aircells and included paranasal sinuses are well-aerated. OTHER: None. CTA HEAD ANTERIOR CIRCULATION: Normal appearance of the cervical internal carotid arteries, petrous, cavernous and supra clinoid internal carotid arteries. Widely patent anterior communicating artery. Normal appearance of the anterior and middle cerebral arteries. No large vessel occlusion, hemodynamically significant stenosis, dissection, luminal irregularity, contrast extravasation or aneurysm. Mild dolichoectasia. POSTERIOR CIRCULATION: RIGHT vertebral artery is dominant with normal appearance of the vertebral arteries, vertebrobasilar junction and basilar artery, as well as main branch vessels. Normal appearance of the posterior cerebral arteries. Fetal origin RIGHT posterior cerebral artery. No large vessel occlusion, hemodynamically significant stenosis, dissection, luminal irregularity, contrast extravasation or aneurysm. Mild dolichoectasia. VENOUS SINUSES: Major dural venous sinuses are patent though not tailored for evaluation on this angiographic examination. ANATOMIC VARIANTS: None. DELAYED PHASE: No abnormal intracranial enhancement. IMPRESSION: CT HEAD: Evolving nonhemorrhagic RIGHT basal ganglia infarct. Otherwise negative CT HEAD. CTA HEAD:  No emergent large vessel occlusion or severe stenosis. Mild dolichoectasia associated with chronic hypertension. Electronically Signed   By: Elon Alas M.D.   On: 07/15/2016 16:23   Mr Brain Wo Contrast  Result Date: 07/15/2016 CLINICAL DATA:  Left-sided weakness.  Left facial droop EXAM: MRI HEAD WITHOUT CONTRAST TECHNIQUE: Multiplanar, multiecho pulse sequences of the brain and surrounding structures were obtained without intravenous contrast. COMPARISON:  CT head 07/14/2016 FINDINGS: Acute infarct in the right posterior putamen. No other areas of acute infarction Minimal chronic microvascular ischemic changes in the white  matter. No cortical infarct. Ventricle size normal.  Cerebral volume normal. Negative for hemorrhage or fluid collection. Negative for mass or edema.  No shift of the midline structures. Circle of Willis patent. Normal orbital structures. Mild mucosal thickening in the paranasal sinuses. Pituitary normal in size IMPRESSION: Acute infarct in the posterior basal ganglia on the right involving the posterior putamen. Electronically Signed   By: Franchot Gallo M.D.   On: 07/15/2016 09:07   US Carotid Bilateral  Result Date: 07/14/2016 CLINICAL DATA:  Left-sided weakness and facial droop EXAM: BILATERAL CAROTID DUPLEX ULTRASOUND TECHNIQUE: Pearline Cables scale imaging, color Doppler and duplex ultrasound were performed of bilateral carotid and vertebral arteries in the neck. COMPARISON:  None. FINDINGS: Criteria: Quantification of carotid stenosis is based on velocity parameters that correlate the residual internal carotid diameter with NASCET-based stenosis levels, using the diameter of the distal internal carotid lumen as the denominator for stenosis measurement. The following peak systolic velocity measurements were obtained: RIGHT ICA:  56 cm/sec CCA:  96 cm/sec SYSTOLIC ICA/CCA RATIO:  0.6 DIASTOLIC  ICA/CCA RATIO:  0.8 ECA:  68 cm/sec LEFT ICA:  60 cm/sec CCA:  83 cm/sec SYSTOLIC ICA/CCA RATIO:  0.7 DIASTOLIC ICA/CCA RATIO:  1.0 ECA:  78 cm/sec RIGHT CAROTID ARTERY: Real-time interrogation of the right carotid distribution reveals mild generalized intimal thickening without appreciable vessel narrowing. RIGHT VERTEBRAL ARTERY: Flow in the right vertebral artery is antegrade. LEFT CAROTID ARTERY: Real-time interrogation of the left carotid distribution shows mild generalized intimal thickening. There is mild plaque formation in the mid the distal common carotid artery causing approximately 20% diameter stenosis. There is rather minimal plaque in the bifurcation region on the left. No stenosis approaching 50% diameter is  seen. LEFT VERTEBRAL ARTERY: Flow in the left vertebral artery is antegrade. IMPRESSION: Mild plaque on the left. No evidence by either real-time interrogation or waveform criteria of hemodynamically significant obstructive disease in either carotid distribution. Flow in both vertebral arteries is in the anatomic direction. Electronically Signed   By: Lowella Grip III M.D.   On: 07/14/2016 11:59     Management plans discussed with the patient, family and they are in agreement.  CODE STATUS:     Code Status Orders        Start     Ordered   07/14/16 1204  Full code  Continuous     07/14/16 1203    Code Status History    Date Active Date Inactive Code Status Order ID Comments User Context   07/14/2016 12:04 PM 07/15/2016  7:22 AM Full Code [id number]  Idelle Crouch, MD Inpatient      TOTAL TIME TAKING CARE OF THIS PATIENT: 35 minutes.    Loletha Grayer M.D on 07/16/2016 at 11:25 AM  Between 7am to 6pm - Pager - [phone]  After 6pm go to [email or web address] - password [email or web address]  [facility] Office  [phone]  CC: Primary care physician; Andi Devon, MD

## 2016-07-17 ENCOUNTER — Inpatient Hospital Stay (HOSPITAL_COMMUNITY): Payer: 59 | Admitting: Occupational Therapy

## 2016-07-17 ENCOUNTER — Inpatient Hospital Stay (HOSPITAL_COMMUNITY): Payer: 59 | Admitting: Physical Therapy

## 2016-07-17 DIAGNOSIS — G8194 Hemiplegia, unspecified affecting left nondominant side: Secondary | ICD-10-CM

## 2016-07-17 MED ORDER — DOCUSATE SODIUM 100 MG PO CAPS
100.0000 mg | ORAL_CAPSULE | Freq: Every day | ORAL | Status: DC
  Administered 2016-07-18 – 2016-07-25 (×8): 100 mg via ORAL
  Filled 2016-07-17 (×12): qty 1

## 2016-07-17 NOTE — Progress Notes (Signed)
60 year old right-handed male with history of hypertension. Per chart review patient is married. Independent prior to admission working for [employer]. One level with one-step entry. Presented to [facility] 07/14/2016 with left-sided weakness and facial droop. Blood pressure elevated 170/106. Cranial CT scan negative. 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 CTA of the head showed no emergent large vessel occlusion or severe stenosis. Carotid Dopplers with no ICA stenosis. Patient did not receive TPA. Echocardiogram with ejection fraction of 60%. Normal systolic function. Placed on aspirin and Plavix for CVA prophylaxis. Subcutaneous Lovenox for DVT prophylaxis  Subjective/Complaints: Patient slept fairly well last night. Wife feels like he had a little bit more facial droop after therapy today. She was asking about whether his stroke is still evolving.  Review of systems negative for chest pains, shortness of breath, nausea, vomiting, diarrhea, constipation, urinary or bowel incontinence.  Objective: Vital Signs: Blood pressure (!) 144/88, pulse 69, temperature 98.2 F (36.8 C), temperature source Oral, resp. rate 18, height 6' 4"  (1.93 m), weight 102.1 kg (225 lb), SpO2 100 %. Ct Angio Head W Or Wo Contrast  Result Date: 07/15/2016 CLINICAL DATA:  LEFT-sided weakness, follow-up RIGHT basal ganglia stroke 1 day ago. History of hypertension. EXAM: CT ANGIOGRAPHY HEAD TECHNIQUE: Multidetector CT imaging of the head was performed using the standard protocol during bolus administration of intravenous contrast. Multiplanar CT image reconstructions and MIPs were obtained to evaluate the vascular anatomy. CONTRAST:  75 cc Isovue 370 COMPARISON:  MRI head July 15, 2016 at 0835 hours and CT HEAD July 14, 2016 FINDINGS: CT HEAD BRAIN: The ventricles and sulci are normal. Faint hypodensities RIGHT basal ganglia  corresponding to known infarcts. No intraparenchymal hemorrhage, mass effect nor midline shift. No acute large vascular territory infarcts. No abnormal extra-axial fluid collections. Basal cisterns are patent. VASCULAR: Unremarkable. SKULL/SOFT TISSUES: No skull fracture. No significant soft tissue swelling. ORBITS/SINUSES: The included ocular globes and orbital contents are normal.The mastoid aircells and included paranasal sinuses are well-aerated. OTHER: None. CTA HEAD ANTERIOR CIRCULATION: Normal appearance of the cervical internal carotid arteries, petrous, cavernous and supra clinoid internal carotid arteries. Widely patent anterior communicating artery. Normal appearance of the anterior and middle cerebral arteries. No large vessel occlusion, hemodynamically significant stenosis, dissection, luminal irregularity, contrast extravasation or aneurysm. Mild dolichoectasia. POSTERIOR CIRCULATION: RIGHT vertebral artery is dominant with normal appearance of the vertebral arteries, vertebrobasilar junction and basilar artery, as well as main branch vessels. Normal appearance of the posterior cerebral arteries. Fetal origin RIGHT posterior cerebral artery. No large vessel occlusion, hemodynamically significant stenosis, dissection, luminal irregularity, contrast extravasation or aneurysm. Mild dolichoectasia. VENOUS SINUSES: Major dural venous sinuses are patent though not tailored for evaluation on this angiographic examination. ANATOMIC VARIANTS: None. DELAYED PHASE: No abnormal intracranial enhancement. IMPRESSION: CT HEAD: Evolving nonhemorrhagic RIGHT basal ganglia infarct. Otherwise negative CT HEAD. CTA HEAD:  No emergent large vessel occlusion or severe stenosis. Mild dolichoectasia associated with chronic hypertension. Electronically Signed   By: Elon Alas M.D.   On: 07/15/2016 16:23   Mr Brain Wo Contrast  Result Date: 07/15/2016 CLINICAL DATA:  Left-sided weakness.  Left facial droop EXAM: MRI  HEAD WITHOUT CONTRAST TECHNIQUE: Multiplanar, multiecho pulse sequences of the brain and surrounding structures were obtained without intravenous contrast. COMPARISON:  CT head 07/14/2016 FINDINGS: Acute infarct in the right posterior putamen. No other areas of acute infarction Minimal chronic microvascular ischemic changes in the white matter.  No cortical infarct. Ventricle size normal.  Cerebral volume normal. Negative for hemorrhage or fluid collection. Negative for mass or edema.  No shift of the midline structures. Circle of Willis patent. Normal orbital structures. Mild mucosal thickening in the paranasal sinuses. Pituitary normal in size IMPRESSION: Acute infarct in the posterior basal ganglia on the right involving the posterior putamen. Electronically Signed   By: Franchot Gallo M.D.   On: 07/15/2016 09:07   Results for orders placed or performed during the hospital encounter of 07/16/16 (from the past 72 hour(s))  CBC     Status: None   Collection Time: 07/16/16  4:38 PM  Result Value Ref Range   WBC 6.9 4.0 - 10.5 K/uL   RBC 4.74 4.22 - 5.81 MIL/uL   Hemoglobin 14.0 13.0 - 17.0 g/dL   HCT 42.5 39.0 - 52.0 %   MCV 89.7 78.0 - 100.0 fL   MCH 29.5 26.0 - 34.0 pg   MCHC 32.9 30.0 - 36.0 g/dL   RDW 13.1 11.5 - 15.5 %   Platelets 247 150 - 400 K/uL  Creatinine, serum     Status: None   Collection Time: 07/16/16  4:38 PM  Result Value Ref Range   Creatinine, Ser 1.04 0.61 - 1.24 mg/dL   GFR calc non Af Amer >60 >60 mL/min   GFR calc Af Amer >60 >60 mL/min    Comment: (NOTE) The eGFR has been calculated using the CKD EPI equation. This calculation has not been validated in all clinical situations. eGFR's persistently <60 mL/min signify possible Chronic Kidney Disease.      HEENT: normal Cardio: RRR and No murmurs Resp: CTA B/L and Unlabored GI: BS positive and Nontender, nondistended Extremity:  No Edema Skin:   Intact and Other Evidence of eczema. Facial Neuro: Alert/Oriented,  Cranial Nerve Abnormalities Left central 7, Normal Sensory, Abnormal Motor 3 minus deltoid, biceps 2 minus, triceps 2 minus, finger flexion, extension 3 minus. Hip flexion 4 minus, knee extension, trace ankle dorsiflexion as well as toe flexion, extension and Tone:  Within Normal Limits Musc/Skel:  Normal Gen. no acute distress   Assessment/Plan: 1. 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 Physiatrist is providing close team supervision and 24 hour management of active medical problems listed below. Physiatrist and rehab team continue to assess barriers to discharge/monitor patient progress toward functional and medical goals. FIM:                   Function - Comprehension Comprehension: Auditory Comprehension assist level: Follows basic conversation/direction with no assist  Function - Expression Expression: Verbal Expression assist level: Expresses basic needs/ideas: With no assist  Function - Social Interaction Social Interaction assist level: Interacts appropriately 75 - 89% of the time - Needs redirection for appropriate language or to initiate interaction.  Function - Problem Solving Problem solving assist level: Solves basic 90% of the time/requires cueing < 10% of the time  Function - Memory Memory assist level: Recognizes or recalls 75 - 89% of the time/requires cueing 10 - 24% of the time  Medical Problem List and Plan: 1.  Left-sided weakness secondary to right posterior basal ganglia infarct                       -PT, OT evals today 2.  DVT Prophylaxis/Anticoagulation: Subcutaneous Lovenox. Monitor  platelet counts and any signs of bleeding 3. Pain Management: Tylenol as needed 4. Hypertension. Norvasc 2.5 mg daily. Monitor with increased mobility Vitals:   07/16/16 1345 07/17/16  0557  BP: (!) 148/94 (!) 144/88  Pulse: 79 69  Resp: 17 18  Temp: 98.4 F (36.9 C) 98.2 F (36.8 C)   5. Neuropsych: This patient is capable of making decisions on his own behalf. 6. Skin/Wound Care: Routine skin checks upon admit 7. Fluids/Electrolytes/Nutrition: Routine I&O's with follow-up chemistries as needed. Labs done yesterday at [facility] were within normal limits except for elevated cholesterol 8. GERD. Protonix 9. Elevated lipids: apparently has an allergy to lipitor--"clots". Will need to review alternative choices/safety of these meds                       -dietary choices and exercise to be reinforced, heart healthy diet  LOS (Days) 1 A FACE TO FACE EVALUATION WAS PERFORMED  Erial Fikes E 07/17/2016, 8:52 AM

## 2016-07-17 NOTE — Evaluation (Signed)
Occupational Therapy Assessment and Plan  Patient Details  Name: Jason Oconnor MRN: [id number] Date of Birth: [birth year]  OT Diagnosis: hemiplegia affecting non-dominant side and muscle weakness (generalized) Rehab Potential: Rehab Potential (ACUTE ONLY): Good ELOS: 17-21 days   Today's Date: 07/17/2016 OT Individual Time: 8850-2774 OT Individual Time Calculation (min): 75 min      Problem List: Patient Active Problem List   Diagnosis Date Noted  . Right basal ganglia embolic stroke ([place]) 12/87/8676  . CVA (cerebral infarction) 07/15/2016  . TIA (transient ischemic attack) 07/14/2016  . HTN, goal below 140/80 07/14/2016  . Left facial numbness 07/14/2016  . Weakness of left side of body 07/14/2016    Past Medical History:  Past Medical History:  Diagnosis Date  . Bone marrow disease   . Hypertension    Past Surgical History: No past surgical history on file.  Assessment & Plan Clinical Impression: Patient is a 60 y.o. right-handed male with history of hypertension. Per chart review patient is married. Independent prior to admission working for [employer]. One level with one-step entry. Presented to [facility] 07/14/2016 with left-sided weakness and facial droop. Blood pressure elevated 170/106. Cranial CT scan negative. 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 CTA of the head showed no emergent large vessel occlusion or severe stenosis. Carotid Dopplers with no ICA stenosis. Patient did not receive TPA. Echocardiogram with ejection fraction of 60%. Normal systolic function. Placed on aspirin and Plavix for CVA prophylaxis. Subcutaneous Lovenox for DVT prophylaxis. Physical and occupational therapy evaluations completed with recommendations of physical medicine rehabilitation consult.  Patient transferred to CIR on 07/16/2016 .    Patient currently requires mod with basic self-care skills  and IADL secondary to muscle weakness, impaired timing and sequencing, abnormal tone, unbalanced muscle activation and decreased coordination and decreased standing balance, decreased postural control, hemiplegia and decreased balance strategies.  Prior to hospitalization, patient could complete ADLs and IADLs with independent .  Patient will benefit from skilled intervention to increase independence with basic self-care skills and increase level of independence with iADL prior to discharge home with care partner.  Anticipate patient will require intermittent supervision and follow up outpatient.  OT - End of Session Activity Tolerance: Tolerates 30+ min activity without fatigue Endurance Deficit: No OT Assessment Rehab Potential (ACUTE ONLY): Good Barriers to Discharge: Decreased caregiver support Barriers to Discharge Comments: wife works during the day, can take some short term leave if needed OT Patient demonstrates impairments in the following area(s): Balance;Motor;Perception;Safety;Sensory;Skin Integrity OT Basic ADL's Functional Problem(s): Eating;Grooming;Bathing;Dressing;Toileting OT Advanced ADL's Functional Problem(s): Simple Meal Preparation OT Transfers Functional Problem(s): Toilet;Tub/Shower OT Additional Impairment(s): Fuctional Use of Upper Extremity OT Plan OT Intensity: Minimum of 1-2 x/day, 45 to 90 minutes OT Frequency: 5 out of 7 days OT Duration/Estimated Length of Stay: 17-21 days OT Treatment/Interventions: Medical illustrator training;Community reintegration;Discharge planning;DME/adaptive equipment instruction;Functional electrical stimulation;Neuromuscular re-education;Patient/family education;Psychosocial support;Self Care/advanced ADL retraining;Therapeutic Activities;Therapeutic Exercise;UE/LE Strength taining/ROM;UE/LE Coordination activities OT Self Feeding Anticipated Outcome(s): Mod I OT Basic Self-Care Anticipated Outcome(s): Mod I OT Toileting Anticipated  Outcome(s): Mod I OT Bathroom Transfers Anticipated Outcome(s): supervision shower transfers, Mod I toilet OT Recommendation Patient destination: Home Follow Up Recommendations: Outpatient OT Equipment Recommended: 3 in 1 bedside comode;Tub/shower seat   Skilled Therapeutic Intervention OT eval completed with discussion of rehab process, OT purpose, POC, goals, and ELOS.  ADL assessment completed with bathing at sit > stand in room shower.  Completed all transfers at stand pivot level from w/c with mod assist for appropriate weight bearing through LLE and cues for technique.  Educated on hemi-dressing technique and crossing LLE over Rt knee to increase success with donning pants.  Pt unable to maintain LLE crossed without tactile cues for positioning.  Pt tends to overcompensate with trunk and shoulder hike during mobility.  Educated on use of LUE as gross assist or stabilizer while completing self-care tasks, requiring mod cues to integrate into tasks.  OT Evaluation Precautions/Restrictions  Precautions Precautions: Fall Restrictions Weight Bearing Restrictions: No General   Vital Signs Therapy Vitals BP: (!) 144/96 Pain Pain Assessment Pain Assessment: No/denies pain Pain Score: 0-No pain Home Living/Prior Functioning Home Living Available Help at Discharge: Family, Available PRN/intermittently Type of Home: House Home Access: Stairs to enter [employer] of Steps: 1 + threshold Entrance Stairs-Rails: None Home Layout: One level Bathroom Shower/Tub: Gaffer, Architectural technologist: Handicapped height Bathroom Accessibility: Yes  Lives With: Spouse IADL History Homemaking Responsibilities: Yes Meal Prep Responsibility: Secondary Laundry Responsibility: Secondary Cleaning Responsibility: Secondary Prior Function Level of Independence: Independent with basic ADLs, Independent with homemaking with ambulation, Independent with gait  Able to Take Stairs?:  Yes Driving: Yes Vocation: Full time employment Leisure: Hobbies-yes (Comment) (playing golf, woodworking) ADL  See Function Navigator Vision/Perception  Vision- History Baseline Vision/History: Wears glasses Wears Glasses: At all times (bifocals) Patient Visual Report: No change from baseline Vision- Assessment Vision Assessment?: No apparent visual deficits  Cognition Overall Cognitive Status: Within Functional Limits for tasks assessed Arousal/Alertness: Awake/alert Orientation Level: Person;Place;Situation Person: Oriented Place: Oriented Situation: Oriented Year: 2017 Month: August Day of Week: Correct Memory: Appears intact Immediate Memory Recall: Sock;Blue;Bed Memory Recall: Sock;Blue;Bed Memory Recall Sock: Without Cue Memory Recall Blue: Without Cue Memory Recall Bed: With Cue Attention: Alternating Awareness: Appears intact Problem Solving: Appears intact Safety/Judgment: Appears intact Sensation Sensation Light Touch: Impaired Detail Light Touch Impaired Details: Impaired LUE (tingling in finger tips) Proprioception: Impaired by gross assessment Coordination Gross Motor Movements are Fluid and Coordinated: No Fine Motor Movements are Fluid and Coordinated: No Finger Nose Finger Test: Unable to complete due to LUE weakness Motor    Mobility  Bed Mobility Bed Mobility: Supine to Sit Supine to Sit: 4: Min guard;With rails;HOB flat Transfers Transfers: Sit to Stand Sit to Stand: 3: Mod assist Sit to Stand Details: Tactile cues for posture;Tactile cues for weight shifting;Tactile cues for weight beaing;Verbal cues for technique;Manual facilitation for weight shifting  Extremity/Trunk Assessment RUE Assessment RUE Assessment: Within Functional Limits LUE Assessment LUE Assessment: Exceptions to WFL LUE AROM (degrees) Overall AROM Left Upper Extremity: Deficits LUE Overall AROM Comments: able to elicit full shoulder flexion with compensatory use of  shoulder hike and abduction, true shoulder flexion grossly 90 degrees. Loose gross grasp with minimal finger extension LUE PROM (degrees) Overall PROM Left Upper Extremity: Within functional limits for tasks assessed LUE Strength LUE Overall Strength: Deficits LUE Overall Strength Comments: Shoulder grossly 3/5, elbow 2/5, and loose gross grasp   See Function Navigator for Current Functional Status.   Refer to Care Plan for Long Term Goals  Recommendations for other services: None  Discharge Criteria: Patient will be discharged from OT if patient refuses treatment 3 consecutive times without medical reason, if treatment goals not met, if there is a change in medical status, if patient makes no progress towards goals or if patient is discharged from hospital.  The above assessment, treatment plan, treatment alternatives and goals were discussed  and mutually agreed upon: by patient and by family  Ellwood Dense Ch [facility] 07/17/2016, 10:29 AM

## 2016-07-17 NOTE — Progress Notes (Signed)
Occupational Therapy Session Note  Patient Details  Name: Jason Oconnor MRN: [id number] Date of Birth: [birth year]  Today's Date: 07/17/2016 OT Individual Time: [id number] OT Individual Time Calculation (min): 72 min     Skilled Therapeutic Interventions/Progress Updates:    Pt completed toilet transfer stand pivot to elevated toilet during session with mod assist.  Decreased left hip and knee control noted with mobility.  Lateral leans to the left in sitting for completion of toilet hygiene with pt only using the RUE to manage clothing throughout.  Transitioned to the therapy gym after toileting via wheelchair.  Worked on quadriped with emphasis on LUE elbow extension and shoulder stability while working on washing the mat with the left hand and reaching for objects.  Mod assist needed to stabilize the left elbow and maintain elbow extension while moving the RUE.  Pt completed rest breaks in tall kneeling with min assist to give UEs a rest.  Next move to active movement of the LUE in sitting position using the UE Ranger.  Pt with decreased control with attempted shoulder flexion and elbow extension but he was able to move hand to target while in the UE ranger but would tend to adduct slightly.  Min assist needed to stabilize the arm while he completed simple movements of shoulder flexion with elbow extension.  Educated pt on self AAROM exercises for the LUE to increase shoulder flexion, elbow flexion/extension, wrist, flexion/extension, and digit flexion/extension.  Also provided half lap tray for RUE support while in wheelchair.  Pt returned to room at end of session and left sitting in wheelchair with spouse present.  Call button in reach.   Therapy Documentation Precautions:  Precautions Precautions: Fall Restrictions Weight Bearing Restrictions: No  Vital Signs: Therapy Vitals Temp: 98.4 F (36.9 C) Temp Source: Oral Pulse Rate: 99 BP: (!) 152/98 Patient Position (if appropriate):  Sitting Oxygen Therapy SpO2: 98 % O2 Device: Not Delivered Pain: Pain Assessment Pain Assessment: No/denies pain   See Function Navigator for Current Functional Status.   Therapy/Group: Individual Therapy  Nasim Garofano OTR/L 07/17/2016, 3:58 PM

## 2016-07-17 NOTE — Evaluation (Signed)
Physical Therapy Assessment and Plan  Patient Details  Name: Jason Oconnor MRN: [id number] Date of Birth: [birth year]  PT Diagnosis: Abnormality of gait, Difficulty walking, Hemiparesis non-dominant, Impaired sensation and Muscle weakness Rehab Potential: Excellent ELOS: 2.5-3 weeks   Today's Date: 07/17/2016 PT Individual Time: 3474-2595 PT Individual Time Calculation (min): 57 min     Problem List: Patient Active Problem List   Diagnosis Date Noted  . Right basal ganglia embolic stroke ([employer]) 63/87/5643  . CVA (cerebral infarction) 07/15/2016  . TIA (transient ischemic attack) 07/14/2016  . HTN, goal below 140/80 07/14/2016  . Left facial numbness 07/14/2016  . Weakness of left side of body 07/14/2016    Past Medical History:  Past Medical History:  Diagnosis Date  . Bone marrow disease   . Hypertension    Past Surgical History: No past surgical history on file.  Assessment & Plan Clinical Impression: Patient is a 60 year old right-handed male with history of hypertension. Per chart review patient is married. Independent prior to admission working for [employer]. One level with one-step entry. Presented to [facility] 07/14/2016 with left-sided weakness and facial droop. Blood pressure elevated 170/106. Cranial CT scan negative. MRI of the brain showed acute infarct in the posterior basal ganglia on the right involving the posterior putamen.CTA of the head showed no emergent large vessel occlusion or severe stenosis.Carotid Dopplers with no ICA stenosis. Patient did not receive TPA. Echocardiogram with ejection fraction of 60%. Normal systolic function. Placed on aspirin and Plavix for CVA prophylaxis. Subcutaneous Lovenox for DVT prophylaxis.  Patient transferred to CIR on 07/16/2016 .   Patient currently requires max with mobility secondary to muscle weakness, impaired timing and sequencing, decreased coordination and decreased motor planning and decreased  sitting balance, decreased standing balance, decreased postural control, hemiplegia and decreased balance strategies.  Prior to hospitalization, patient was independent  with mobility and lived with Spouse in a Other(Comment) (town house) home.  Home access is 1 + thresholdStairs to enter.  Patient will benefit from skilled PT intervention to maximize safe functional mobility, minimize fall risk and decrease caregiver burden for planned discharge home with intermittent assist.  Anticipate patient will benefit from follow up OP at discharge.  PT - End of Session Activity Tolerance: Endurance does not limit participation in activity Endurance Deficit: No PT Assessment Rehab Potential (ACUTE/IP ONLY): Excellent PT Patient demonstrates impairments in the following area(s): Balance;Motor;Sensory PT Transfers Functional Problem(s): Bed Mobility;Bed to Chair;Car;Furniture PT Locomotion Functional Problem(s): Ambulation;Wheelchair Mobility;Stairs PT Plan PT Intensity: Minimum of 1-2 x/day ,45 to 90 minutes PT Frequency: 5 out of 7 days PT Duration Estimated Length of Stay: 2.5-3 weeks PT Treatment/Interventions: Ambulation/gait training;Balance/vestibular training;Community reintegration;Discharge planning;Disease management/prevention;DME/adaptive equipment instruction;Functional electrical stimulation;Functional mobility training;Neuromuscular re-education;Patient/family education;Splinting/orthotics;Stair training;Therapeutic Activities;Therapeutic Exercise;UE/LE Strength taining/ROM;UE/LE Coordination activities;Wheelchair propulsion/positioning PT Transfers Anticipated Outcome(s): Mod I PT Locomotion Anticipated Outcome(s): Mod I PT Recommendation Follow Up Recommendations: Outpatient PT Patient destination: Home Equipment Recommended: To be determined  Skilled Therapeutic Intervention Pt received in w/c with wife present; cushion obtained for pt and pt perform sit > stand with UE support on RW  with mod-max A to place cushion.  Pt participated in PT evaluation with focus on assessment of LE strength, sensation, w/c mobility, transfers w/c <> mat, sit <> supine on flat mat, gait and stair negotiation as documented below.  Performed gait back to w/c with RW with L hand orthosis x 50' with mod-max A with verbal cues for more upright posture, guidance of RW and  facilitation of lateral and anterior weight shifting to bring COG over L stance LE and minimize L genu recurvatum.  Back in room discussed goals and ELOS with pt and wife.  Pt left in w/c with wife present and all items within reach.  PT Evaluation Precautions/Restrictions Precautions Precautions: Fall Precaution Comments: Monitor vitals Restrictions Weight Bearing Restrictions: No General Chart Reviewed: Yes Response to Previous Treatment: Patient with no complaints from previous session. Family/Caregiver Present: Yes Vital SignsTherapy Vitals Temp: 98.4 F (36.9 C) Temp Source: Oral Pulse Rate: 99 BP: (!) 152/98 Patient Position (if appropriate): Sitting Oxygen Therapy SpO2: 98 % O2 Device: Not Delivered Pain Pain Assessment Pain Assessment: No/denies pain Home Living/Prior Functioning Home Living Available Help at Discharge: Family;Available PRN/intermittently Type of Home: Other(Comment) (town house) Home Access: Stairs to enter [employer] of Steps: 1 + threshold Entrance Stairs-Rails: None Home Layout: One level Bathroom Shower/Tub: Walk-in Sales promotion account executive: Handicapped height Bathroom Accessibility: Yes  Lives With: Spouse Prior Function Level of Independence: Independent with gait;Independent with transfers  Able to Take Stairs?: Yes Driving: Yes Vocation: Full time employment Sensation Sensation Light Touch: Impaired Detail Light Touch Impaired Details: Impaired LUE;Impaired LLE (L hand and L toes numb) Stereognosis: Not tested Hot/Cold: Not tested Proprioception: Impaired  by gross assessment Coordination Gross Motor Movements are Fluid and Coordinated: No Fine Motor Movements are Fluid and Coordinated: No Motor  Motor Motor: Hemiplegia Motor - Skilled Clinical Observations: L hemiparesis  Mobility Bed Mobility Bed Mobility: Sit to Supine;Supine to Sit Supine to Sit: 4: Min assist Sit to Supine: 4: Min assist Sit to Supine - Details (indicate cue type and reason): Supine <> sit on flat mat with min A to attend to LUE position and for balance when sitting upright from flat Transfers Transfers: Yes Stand Pivot Transfers: 2: Max assist Stand Pivot Transfer Details (indicate cue type and reason): Stand pivot without UE support on RW requiring max lifting and controlled lowering assistance and for balance when pivoting Locomotion  Ambulation Ambulation: Yes Ambulation/Gait Assistance: 2: Max assist Ambulation Distance (Feet): 50 Feet Assistive device: Rolling walker Gait Gait: Yes Gait Pattern: Impaired Gait Pattern: Step-to pattern;Decreased stance time - left;Decreased step length - left;Decreased stride length;Decreased hip/knee flexion - left;Decreased dorsiflexion - left;Left genu recurvatum;Trunk flexed;Poor foot clearance - left Stairs / Additional Locomotion Stairs: Yes Stairs Assistance: 3: Mod assist Stairs Assistance Details: Verbal cues for sequencing;Manual facilitation for weight shifting;Manual facilitation for placement;Manual facilitation for weight bearing;Tactile cues for weight shifting;Tactile cues for posture;Tactile cues for sequencing Stair Management Technique: Two rails;Step to pattern;Forwards Number of Stairs: 4 Height of Stairs: 6 Wheelchair Mobility Wheelchair Mobility: Yes Wheelchair Assistance: 4: Energy manager: Both lower extermities;Right upper extremity Wheelchair Parts Management: Needs assistance Distance: 150  Trunk/Postural Assessment  Cervical Assessment Cervical Assessment: Within  Functional Limits Thoracic Assessment Thoracic Assessment: Within Functional Limits Lumbar Assessment Lumbar Assessment: Within Functional Limits Postural Control Postural Control: Deficits on evaluation (multiple LOB forwards and laterally during gait)  Balance Balance Balance Assessed: Yes Static Sitting Balance Static Sitting - Level of Assistance: 6: Modified independent (Device/Increase time) Dynamic Sitting Balance Dynamic Sitting - Level of Assistance: 5: Stand by assistance Static Standing Balance Static Standing - Balance Support: Right upper extremity supported;Left upper extremity supported Static Standing - Level of Assistance: 3: Mod assist Dynamic Standing Balance Dynamic Standing - Level of Assistance: 2: Max assist;3: Mod assist Extremity Assessment  RLE Assessment RLE Assessment: Within Functional Limits LLE Assessment LLE Assessment:  (3/5 overall  except ankle DF 0/5)   See Function Navigator for Current Functional Status.   Refer to Care Plan for Long Term Goals  Recommendations for other services: None  Discharge Criteria: Patient will be discharged from PT if patient refuses treatment 3 consecutive times without medical reason, if treatment goals not met, if there is a change in medical status, if patient makes no progress towards goals or if patient is discharged from hospital.  The above assessment, treatment plan, treatment alternatives and goals were discussed and mutually agreed upon: by patient and by family  Malachy Mood 07/17/2016, 5:14 PM

## 2016-07-17 NOTE — Progress Notes (Signed)
Patient information reviewed and entered into eRehab system by Christobal Morado, RN, CRRN, PPS Coordinator.  Information including medical coding and functional independence measure will be reviewed and updated through discharge.    

## 2016-07-18 ENCOUNTER — Inpatient Hospital Stay (HOSPITAL_COMMUNITY): Payer: 59 | Admitting: Physical Therapy

## 2016-07-18 ENCOUNTER — Inpatient Hospital Stay (HOSPITAL_COMMUNITY): Payer: 59 | Admitting: Occupational Therapy

## 2016-07-18 DIAGNOSIS — I69359 Hemiplegia and hemiparesis following cerebral infarction affecting unspecified side: Secondary | ICD-10-CM

## 2016-07-18 NOTE — Progress Notes (Signed)
Social Work Patient ID: Jason Oconnor, male   DOB: 1956/09/25, 60 y.o.   MRN: [id number]   CSW met with pt and his wife to update them on team conference discussion, as well as to complete assessment.  Explained that therapists expect pt to do very well on Rehab and that they set a d/c date of 08-06-16 with modified independent goals.  Pt and wife were pleased with this and wife can come in for family education closer to d/c.  She plans to go back to school (works as a Optometrist) while pt is here and then do half days initially when he goes home, so he won't be home alone all day at first.  Severn explained that we will continue therapies and that CSW will order any needed DME.  They expressed appreciation.  CSW will continue to follow and assist as needed.

## 2016-07-18 NOTE — Progress Notes (Signed)
Subjective/Complaints: Tolerated therapy fairly well. Does have some knee soreness and shoulder soreness after quadruped work yesterday  Review of systems negative for chest pains, shortness of breath, nausea, vomiting, diarrhea, constipation, urinary or bowel incontinence.  Objective: Vital Signs: Blood pressure 135/88, pulse 72, temperature 98 F (36.7 C), temperature source Oral, resp. rate 18, height 6' 4"  (1.93 m), weight 102.1 kg (225 lb), SpO2 98 %. No results found. Results for orders placed or performed during the hospital encounter of 07/16/16 (from the past 72 hour(s))  CBC     Status: None   Collection Time: 07/16/16  4:38 PM  Result Value Ref Range   WBC 6.9 4.0 - 10.5 K/uL   RBC 4.74 4.22 - 5.81 MIL/uL   Hemoglobin 14.0 13.0 - 17.0 g/dL   HCT 42.5 39.0 - 52.0 %   MCV 89.7 78.0 - 100.0 fL   MCH 29.5 26.0 - 34.0 pg   MCHC 32.9 30.0 - 36.0 g/dL   RDW 13.1 11.5 - 15.5 %   Platelets 247 150 - 400 K/uL  Creatinine, serum     Status: None   Collection Time: 07/16/16  4:38 PM  Result Value Ref Range   Creatinine, Ser 1.04 0.61 - 1.24 mg/dL   GFR calc non Af Amer >60 >60 mL/min   GFR calc Af Amer >60 >60 mL/min    Comment: (NOTE) The eGFR has been calculated using the CKD EPI equation. This calculation has not been validated in all clinical situations. eGFR's persistently <60 mL/min signify possible Chronic Kidney Disease.      HEENT: normal Cardio: RRR and No murmurs Resp: CTA B/L and Unlabored GI: BS positive and Nontender, nondistended Extremity:  No Edema Skin:   Intact and Other Evidence of eczema. Facial Neuro: Alert/Oriented, Cranial Nerve Abnormalities Left central 7, Normal Sensory, Abnormal Motor 3 minus deltoid, biceps 2 minus, triceps 2 minus, finger flexion, extension 3 minus. Hip flexion 4 minus, knee extension, trace ankle dorsiflexion as well as toe flexion, extension and Tone:  Within Normal Limits Musc/Skel:  Normal Gen. no acute  distress   Assessment/Plan: 1. 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 Physiatrist is providing close team supervision and 24 hour management of active medical problems listed below. Physiatrist and rehab team continue to assess barriers to discharge/monitor patient progress toward functional and medical goals. FIM: Function - Bathing Position: Shower Body parts bathed by patient: Left arm, Chest, Abdomen, Front perineal area, Right upper leg, Left upper leg Body parts bathed by helper: Right arm, Buttocks Bathing not applicable: Right lower leg, Left lower leg Assist Level:  (Mod assist)  Function- Upper Body Dressing/Undressing What is the patient wearing?: Pull over shirt/dress Pull over shirt/dress - Perfomed by patient: Thread/unthread right sleeve, Put head through opening, Pull shirt over trunk Pull over shirt/dress - Perfomed by helper: Thread/unthread left sleeve Assist Level: Touching or steadying assistance(Pt > 75%) Function - Lower Body Dressing/Undressing What is the patient wearing?: Underwear, Pants, Shoes, Ted Hose Position: Wheelchair/chair at [employer] - Performed by patient: Thread/unthread right underwear leg, Pull underwear up/down Underwear - Performed by helper: Thread/unthread left underwear leg Pants- Performed by patient: Thread/unthread right pants leg, Pull pants up/down Pants- Performed by helper: Thread/unthread left pants leg Shoes - Performed by patient: Don/doff right shoe Shoes - Performed by helper: Don/doff left shoe TED Hose -  Performed by helper: Don/doff right TED hose, Don/doff left TED hose Assist for footwear: Partial/moderate assist Assist for lower body dressing:  (Mod assist)  Function - Toileting Toileting steps completed by patient: Adjust clothing  prior to toileting, Performs perineal hygiene, Adjust clothing after toileting Toileting Assistive Devices: Grab bar or rail Assist level: Touching or steadying assistance (Pt.75%)  Function - Toilet Transfers Toilet transfer assistive device: Elevated toilet seat/BSC over toilet Assist level to toilet: Moderate assist (Pt 50 - 74%/lift or lower) Assist level from toilet: Moderate assist (Pt 50 - 74%/lift or lower)  Function - Chair/bed transfer Chair/bed transfer method: Stand pivot Chair/bed transfer assist level: Maximal assist (Pt 25 - 49%/lift and lower) Chair/bed transfer assistive device: Armrests  Function - Locomotion: Wheelchair Type: Manual Max wheelchair distance: 150 Assist Level: Touching or steadying assistance (Pt > 75%) Assist Level: Touching or steadying assistance (Pt > 75%) Assist Level: Touching or steadying assistance (Pt > 75%) Turns around,maneuvers to table,bed, and toilet,negotiates 3% grade,maneuvers on rugs and over doorsills: No Function - Locomotion: Ambulation Assistive device: Walker-rolling, Orthosis Max distance: 50 Assist level: Maximal assist (Pt 25 - 49%) Assist level: Maximal assist (Pt 25 - 49%) Assist level: Maximal assist (Pt 25 - 49%) Walk 150 feet activity did not occur: Safety/medical concerns Walk 10 feet on uneven surfaces activity did not occur: Safety/medical concerns  Function - Comprehension Comprehension: Auditory Comprehension assist level: Follows basic conversation/direction with no assist  Function - Expression Expression: Verbal Expression assist level: Expresses complex ideas: With no assist  Function - Social Interaction Social Interaction assist level: Interacts appropriately with others - No medications needed.  Function - Problem Solving Problem solving assist level: Solves basic 90% of the time/requires cueing < 10% of the time  Function - Memory Memory assist level: Recognizes or recalls 90% of the  time/requires cueing < 10% of the time Patient normally able to recall (first 3 days only): Current season, Staff names and faces, That he or she is in a hospital  Medical Problem List and Plan: 1.  Left-sided weakness secondary to right posterior basal ganglia infarct                       -PT, OT CIR level 2.  DVT Prophylaxis/Anticoagulation: Subcutaneous Lovenox. Monitor platelet counts and any signs of bleeding 3. Pain Management: Tylenol as needed 4. Hypertension. Norvasc 2.5 mg daily. Monitor with increased mobility Vitals:   07/17/16 1509 07/18/16 0619  BP: (!) 152/98 135/88  Pulse: 99 72  Resp:  18  Temp: 98.4 F (36.9 C) 98 F (36.7 C)   5. Neuropsych: This patient is capable of making decisions on his own behalf. 6. Skin/Wound Care: Routine skin checks upon admit 7. Fluids/Electrolytes/Nutrition: Routine I&O's with follow-up chemistries as needed. Labs done yesterday at [facility] were within normal limits except for elevated cholesterol 8. GERD. Protonix 9. Elevated lipids: apparently has an allergy to lipitor--"clots". Will need to review alternative choices/safety of these meds                       -dietary choices and exercise to be reinforced, Total cholesterol 205, LDL 138, may be able to bring this down with diet only heart healthy diet  LOS (Days) 2 A FACE TO FACE EVALUATION WAS PERFORMED  KIRSTEINS,ANDREW E 07/18/2016, 10:02 AM

## 2016-07-18 NOTE — Progress Notes (Signed)
Social Work Assessment and Plan Social Work Assessment and Plan  Patient Details  Name: Jason Oconnor MRN: [id number] Date of Birth: 1956/08/25  Today's Date: 07/18/2016  Problem List:  Patient Active Problem List   Diagnosis Date Noted  . Hemiparesis affecting nondominant side as late effect of cerebrovascular accident (CVA) (HCC) 07/18/2016  . Right basal ganglia embolic stroke (HCC) 07/16/2016  . CVA (cerebral infarction) 07/15/2016  . TIA (transient ischemic attack) 07/14/2016  . HTN, goal below 140/80 07/14/2016  . Left facial numbness 07/14/2016  . Weakness of left side of body 07/14/2016   Past Medical History:  Past Medical History:  Diagnosis Date  . Bone marrow disease   . Hypertension    Past Surgical History: No past surgical history on file. Social History:  reports that he has quit smoking. He has never used smokeless tobacco. He reports that he drinks alcohol. He reports that he does not use drugs.  Family / Support Systems Marital Status: Married How Long?: 35 years Patient Roles: Spouse, Parent Spouse/Significant Other: Jason Oconnor - wife - [phone] Children: pt has 2 sons and 1 dtr Anticipated Caregiver: wife, Jason Oconnor Ability/Limitations of Caregiver: Wife works for [employer] and states she has 200 days in her bank of days off.  Anticipates she will be able to take as much time off as needed to care for her husband, although with mod I goals she only intends to work half days for awhile after his d/c and then return to fulltime. Caregiver Availability: Intermittent Family Dynamics: close, supportive wife  Social History Preferred language: English Religion: Non-Denominational Read: Yes Write: Yes Employment Status: Employed Technical brewer) Name of Employer: [employer] of Employment: 25 Return to Work Plans: Pt would like to return to work when he is able. Legal Hisotry/Current Legal Issues: none reported Guardian/Conservator:  N/A - MD has determined that pt is capable of making his own decisions.   Abuse/Neglect Physical Abuse: Denies Verbal Abuse: Denies Sexual Abuse: Denies Exploitation of patient/patient's resources: Denies Self-Neglect: Denies  Emotional Status Pt's affect, behavior adn adjustment status: Pt is highly motivated to recover from this stroke.  He remains in positive spirits and is ready to get to work on [employer]. 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 Pyschiatric History: none reported Substance Abuse History: none reported  Patient / Family Perceptions, Expectations & Goals Pt/Family understanding of illness & functional limitations: Pt and wife expressed an understanding about why pt had a stroke.   Premorbid pt/family roles/activities: Pt likes to golf and do woodworking, building furniture.  Pt was also working fulltime. Anticipated changes in roles/activities/participation: Pt would like to return to his hobbies and work as he is able. Pt/family expectations/goals: Pt wants to regain his independence and not be a burden on his wife.  Community Resources [employer]: None Premorbid Home Care/DME Agencies: None Transportation available at discharge: wife Resource referrals recommended: Support group (specify)  Discharge Planning Living Arrangements: Spouse/significant other Support Systems: Spouse/significant other, Children, Manufacturing engineer, Other relatives Type of Residence: Private residence Insurance Resources: Media planner (specify) (Advertising copywriter) Architect: Employment (and Music therapist) Surveyor, quantity Screen Referred: No Money Management: Patient, Spouse Does the patient have any problems obtaining your medications?: No Home Management: Pt did all of the home maintenance/management.  Wife will be able to do inside  chores. Patient/Family Preliminary Plans:  Pt plans to return to his home with his wife to provide intermittent supervision, as needed. Barriers to Discharge: Steps (only 1 to enter) Social Work Anticipated Follow Up Needs: HH/OP, Support Group Expected length of stay: 19-21 days  Clinical Impression CSW met with pt and pt's wife to introduce self and role of CSW, as well as to complete assessment.  CSW also went over the team conference discussion.  Pt and wife are fun-loving and supportive of one another.  Pt is very motivated to participate with therapies and is grateful to be on Rehab.  Pt's wife will help, as needed, but plans to return to work and work half days for a while after pt returns home.  CSW explained that CSW could arrange home or outpt therapies, as recommended, and will also obtain any recommended DME.  Pt does not have any concerns/questions/needs at this time.  CSW will continue to follow and assist as needed.  Jason Oconnor, Silvestre Mesi 07/18/2016, 2:20 PM

## 2016-07-18 NOTE — Progress Notes (Signed)
Physical Therapy Session Note  Patient Details  Name: Jason Oconnor MRN: [id number] Date of Birth: 06-Oct-1956  Today's Date: 07/18/2016 PT Individual Time: [id number] PT Individual Time Calculation (min): 28 min    Short Term Goals: Week 1:  PT Short Term Goal 1 (Week 1): Pt will perform bed mobility on flat bed with supervision PT Short Term Goal 2 (Week 1): Pt will perform bed <> chair transfers with LRAD and consistent mod A PT Short Term Goal 3 (Week 1): Pt will perform gait in controlled environment x 100' with LRAD and consistent mod A  PT Short Term Goal 4 (Week 1): Pt will perform stair negotiation up/down 8 stairs with 2 rails with mod A for LE strengthening PT Short Term Goal 5 (Week 1): Pt will perform w/c mobility x 150' with supervision and maintain trunk and hip position in w/c  Skilled Therapeutic Interventions/Progress Updates:    Patient in recliner, reporting fatigue but agreeable to therapy. Performed sit <> stand transfers with mod A overall, short distance gait from recliner to wheelchair with max A (LUE around therapist), simulated car transfer to small SUV height via stand pivot with max A, gait on compliant surface x 10 ft with LUE around therapist with max A, picking up object from floor with max A, and wheelchair mobility via R hemi technique x 250 ft with supervision. Patient performed stand pivot transfer back to recliner with max A and left sitting in recliner with all needs in reach.   Therapy Documentation Precautions:  Precautions Precautions: Fall Precaution Comments: Monitor vitals Restrictions Weight Bearing Restrictions: No Pain:  Denies pain, c/o fatigue  See Function Navigator for Current Functional Status.   Therapy/Group: Individual Therapy  Laretta Alstrom 07/18/2016, 4:40 PM

## 2016-07-18 NOTE — Patient Care Conference (Signed)
Inpatient RehabilitationTeam Conference and Plan of Care Update Date: 07/17/2016   Time: 11:25 AM    Patient Name: Jason Oconnor      Medical Record Number: [id number]  Date of Birth: October 17, 1956 Sex: Male         Room/Bed: 4W10C/4W10C-01 Payor Info: Payor: Theme park manager / Plan: [employer] OTHER / Product Type: *No Product type* /    Admitting Diagnosis: CVA  Admit Date/Time:  07/16/2016  2:52 PM Admission Comments: No comment available   Primary Diagnosis:  Right basal ganglia embolic stroke (Jason Oconnor) Principal Problem: Right basal ganglia embolic stroke Jason Oconnor)  Patient Active Problem List   Diagnosis Date Noted  . Hemiparesis affecting nondominant side as late effect of cerebrovascular accident (CVA) (Jason Oconnor) 07/18/2016  . Right basal ganglia embolic stroke (Jason Oconnor) [id number]  . CVA (cerebral infarction) 07/15/2016  . TIA (transient ischemic attack) 07/14/2016  . HTN, goal below 140/80 07/14/2016  . Left facial numbness 07/14/2016  . Weakness of left side of body 07/14/2016    Expected Discharge Date: Expected Discharge Date: 08/06/16  Team Members Present: Physician leading conference: Dr. Alysia Penna Social Worker Present: Alfonse Alpers, LCSW Nurse Present: Rayetta Pigg, RN PT Present: Raylene Everts, PT OT Present: Simonne Come, OT SLP Present: Windell Moulding, SLP PPS Coordinator present : Daiva Nakayama, RN, CRRN     Current Status/Progress Goal Weekly Team Focus  Medical   BPs running high, will need to adjust in 1-2 weeks  maintain medical stability for rehab stay  initiate rehab   Bowel/Bladder   Continent of bowel and bladder; Bowel regimen same as at home  Mod I  Assess and treat for constipation as needed   Swallow/Nutrition/ Hydration             ADL's   Mod assist overall  Mod I  LUE NMR, ADL retraining, dynamic standing balance   Mobility   mod-max A  Mod I  L NMR, postural control, balance, gait   Communication             Safety/Cognition/ Behavioral  Observations  no unsafe behaviors noted         Pain   C/o soreness at times; tylenol prn effective  <3  Assess and treat for pain q shift and prn   Skin   No skin breakdown noted  Mod I  Assess skin q shift and prn    Rehab Goals Patient on target to meet rehab goals: Yes Rehab Goals Revised: none - pt just evaluated on the day of team conference *See Care Plan and progress notes for long and short-term goals.  Barriers to Discharge: still requires physical assist, no 24/7 assist available post d/c    Possible Resolutions to Barriers:  Cont rehab    Discharge Planning/Teaching Needs:  Pt plans to return to his home with his wife who will provide intermittent supervision, as needed.  Pt's wife can come in for family education closer to d/c.   Team Discussion:  Pt's blood pressure is on the high side and MD will continue to monitor this and will make changes as needed next week.  Pt is continent.  His goals are modified independent and he is very motivated and moves well with good compensation.  RN questioned need for soft diet and MD stated he does not require this.  Order will be changed.  RN also asked MD if pt's colace could be decreased from twice a day to once a day, per pt's request.  This was changed, as well.  Revisions to Treatment Plan:  none   Continued Need for Acute Rehabilitation Level of Care: The patient requires daily medical management by a physician with specialized training in physical medicine and rehabilitation for the following conditions: Daily direction of a multidisciplinary physical rehabilitation program to ensure safe treatment while eliciting the highest outcome that is of practical value to the patient.: Yes Daily medical management of patient stability for increased activity during participation in an intensive rehabilitation regime.: Yes Daily analysis of laboratory values and/or radiology reports with any subsequent need for medication adjustment of  medical intervention for : Neurological problems  Jason Oconnor, Jason Oconnor 07/18/2016, 2:19 PM

## 2016-07-18 NOTE — Progress Notes (Signed)
Inpatient [facility] Individual Statement of Services  Patient Name:  Khanye Clement  Date:  07/18/2016  Welcome to the [facility].  Our goal is to provide you with an individualized program based on your diagnosis and situation, designed to meet your specific needs.  With this comprehensive rehabilitation program, you will be expected to participate in at least 3 hours of rehabilitation therapies Monday-Friday, with modified therapy programming on the weekends.  Your rehabilitation program will include the following services:  Physical Therapy (PT), Occupational Therapy (OT), 24 hour per day rehabilitation nursing, Case Management (Social Worker), Rehabilitation Medicine, Nutrition Services and Pharmacy Services  Weekly team conferences will be held on Wednesdays to discuss your progress.  Your Social Worker will talk with you frequently to get your input and to update you on team discussions.  Team conferences with you and your family in attendance may also be held.  Expected length of stay: 3 weeks  Overall anticipated outcome: Modified Independent with supervision for tub transfers, community ambulation, and stairs  Depending on your progress and recovery, your program may change. Your Social Worker will coordinate services and will keep you informed of any changes. Your Social Worker's name and contact numbers are listed  below.  The following services may also be recommended but are not provided by the [facility] will be made to provide these services after discharge if needed.  Arrangements include referral to agencies that provide these services.  Your insurance has been verified to be:  [facility] primary doctor is:  Dr. Richardo Priest  Pertinent information will be shared with  your doctor and your insurance company.  Social Worker:  Alfonse Alpers, LCSW  [phone] or ([phone]  Information discussed with and copy given to patient by: Trey Sailors, 07/18/2016, 12:55 PM

## 2016-07-18 NOTE — Progress Notes (Signed)
Occupational Therapy Session Note  Patient Details  Name: Jason Oconnor MRN: [id number] Date of Birth: 1956/07/22  Today's Date: 07/18/2016 OT Individual Time: [phone] and [id number] OT Individual Time Calculation (min): 60 min and 58 min    Short Term Goals: Week 1:  OT Short Term Goal 1 (Week 1): Pt will complete toilet transfer with Min assist with LRAD OT Short Term Goal 2 (Week 1): Pt will complete LB dressing with min assist OT Short Term Goal 3 (Week 1): Pt will complete 2 grooming tasks in standing with min assist for standing balance OT Short Term Goal 4 (Week 1): Pt will utilize LUE as gross assist with min cues during functional activity  Skilled Therapeutic Interventions/Progress Updates:    1) Treatment session with focus on functional mobility, transfers, and use of LUE during self-care tasks.  Pt ambulated to room shower with RW and mod assist, facilitation at Lt hip and trunk during stepping.  Bathing completed with min assist for standing balance and tactile cues to promote midline standing to increase weight bearing through LLE.  Mod cues to incorporate LUE into tasks and to follow hemi-dressing technique.  Engaged in [place] with use of UE Ranger, therapist provided tactile cues at elbow to facilitate improved motor control.  Educated on self-ROM and table top task to increase shoulder and elbow motor return.  2) Treatment session with focus on trunk control and LUE NMR.  Pt received in bed upon arrival, completed stand pivot transfer with min assist and min cues for LLE positioning prior to transfer.  Propelled w/c with BLE with focus on LLE activation and strengthening to decrease compensatory use of RLE.  Engaged in weight bearing through LUE in sitting while reaching across midline with RUE to further elicit increased weight bearing.  Post weight bearing engaged in reaching activity with LUE with improved shoulder flexion and ability to maintain positioning against gravity.   Continued LUE NMR in supine with focus on shoulder flexion and strengthening while withstanding min resistance from therapist.  Transitioned to sidelying with focus on shoulder flexion and protraction against mod resistance.  Pt demonstrating improved initiation and motor control post resistance.  Hand over hand from therapist at [place] hand to promote increased motor control and coordination of movements to correct compensatory movements.  Provided pt with self-ROM HEP, educated and demonstrated exercises addressing shoulder, elbow, wrist, and finger with pt verbalizing understanding.  Therapy Documentation Precautions:  Precautions Precautions: Fall Precaution Comments: Monitor vitals Restrictions Weight Bearing Restrictions: No Pain: Pain Assessment Pain Assessment: No/denies pain Pain Score: 0-No pain  See Function Navigator for Current Functional Status.   Therapy/Group: Individual Therapy  Simonne Come 07/18/2016, 10:39 AM

## 2016-07-18 NOTE — Progress Notes (Addendum)
Physical Therapy Session Note  Patient Details  Name: Jason Oconnor MRN: [id number] Date of Birth: 1956/03/14  Today's Date: 07/18/2016 PT Individual Time: 1416-1500 PT Individual Time Calculation (min): 44 min    Short Term Goals: Week 1:  PT Short Term Goal 1 (Week 1): Pt will perform bed mobility on flat bed with supervision PT Short Term Goal 2 (Week 1): Pt will perform bed <> chair transfers with LRAD and consistent mod A PT Short Term Goal 3 (Week 1): Pt will perform gait in controlled environment x 100' with LRAD and consistent mod A  PT Short Term Goal 4 (Week 1): Pt will perform stair negotiation up/down 8 stairs with 2 rails with mod A for LE strengthening PT Short Term Goal 5 (Week 1): Pt will perform w/c mobility x 150' with supervision and maintain trunk and hip position in w/c  Skilled Therapeutic Interventions/Progress Updates:     Patient received sitting in [facility] and agreeable to PT. PT transported patient to rehab gym for time management. Stand pivot transfer with mod A from PT. Once at edge of mat table, patient performed sit>supine with min A at the trunk to improve control of movement.   PT instructed patient in neuromuscular reeducation for forced use of LLE . Bridges x 10 with 3 second hold, SAQx 10 with 2 second hold, heel slides with manual resistance from PT to improve HS activation, ankle pumps with AAROMx 15 , hip abduction against level 2 tbandx 10 . Alternating Hip flexion/extension with cues for movement of ankle DF/PF in synergistic pattern. Throughout NMR, PT provided mod verbal cues for decreased speed of movement to increase control and strengthening aspect of Musculature, reduced compensation of trunk, and increased ROM as tolerated.   PT instructed patient in supine>site with min A for stabilization at the LUE to allow push through hand to come from sidelying to sitting. PT instucted patient in gait training x 60 ft with mod A for improved knee stability with use  of RW and L hand splint. Mod cues for increased hip/knee flexion to allow improved foot clearance, as well as for increased attempt to prevent snap back of knee and improve heel contact.  Throughout treatment, PT provided mod A for sit<>stand x 8 with cues for improved LE positioning and proper use of R UE to push to standing position.   Patient returned to room and left sitting on toilet for bowel movement following stand pivot to toilet with mod A from PT and RW. RN aware of patient position.    Therapy Documentation Precautions:  Precautions Precautions: Fall Precaution Comments: Monitor vitals Restrictions Weight Bearing Restrictions: No   See Function Navigator for Current Functional Status.   Therapy/Group: Individual Therapy  Lorie Phenix 07/18/2016, 5:37 PM

## 2016-07-19 ENCOUNTER — Inpatient Hospital Stay (HOSPITAL_COMMUNITY): Payer: 59 | Admitting: Physical Therapy

## 2016-07-19 ENCOUNTER — Inpatient Hospital Stay (HOSPITAL_COMMUNITY): Payer: 59 | Admitting: Occupational Therapy

## 2016-07-19 DIAGNOSIS — I69359 Hemiplegia and hemiparesis following cerebral infarction affecting unspecified side: Secondary | ICD-10-CM

## 2016-07-19 DIAGNOSIS — M6289 Other specified disorders of muscle: Secondary | ICD-10-CM

## 2016-07-19 NOTE — Progress Notes (Signed)
Physical Therapy Session Note  Patient Details  Name: Jason Oconnor MRN: [id number] Date of Birth: 1956-06-29  Today's Date: 07/19/2016 PT Individual Time: 1031-1200 PT Individual Time Calculation (min): 89 min    Short Term Goals: Week 1:  PT Short Term Goal 1 (Week 1): Pt will perform bed mobility on flat bed with supervision PT Short Term Goal 2 (Week 1): Pt will perform bed <> chair transfers with LRAD and consistent mod A PT Short Term Goal 3 (Week 1): Pt will perform gait in controlled environment x 100' with LRAD and consistent mod A  PT Short Term Goal 4 (Week 1): Pt will perform stair negotiation up/down 8 stairs with 2 rails with mod A for LE strengthening PT Short Term Goal 5 (Week 1): Pt will perform w/c mobility x 150' with supervision and maintain trunk and hip position in w/c  Skilled Therapeutic Interventions/Progress Updates:  Pt received in bed, no c/o pain.  Pt performed supine > sit with supervision and stand pivot to w/c with mod A.  Performed w/c mobility training x 150' with focus on LE propulsion and increased activation of LLE hamstring.  In gym performed transfer to Nustep stand pivot with mod A.  Pt performed 10 minutes on Nustep with bilat LE and LUE for NMR/activation, strengthening, coordination and endurance training.  Continued NMR with gait and stair negotiation, see below for details.  Returned to w/c and to room where pt transferred w/c > recliner stand pivot with mod A.  Pt left in recliner with all items within reach for lunch.   Therapy Documentation Precautions:  Precautions Precautions: Fall Precaution Comments: Monitor vitals Restrictions Weight Bearing Restrictions: No Pain: Pain Assessment Pain Assessment: No/denies pain Pain Score: 0-No pain Other Treatments: Treatments Neuromuscular Facilitation: Left;Upper Extremity;Lower Extremity;Forced use;Activity to increase coordination;Activity to increase motor control;Activity to increase timing and  sequencing;Activity to increase grading;Activity to increase sustained activation;Activity to increase lateral weight shifting;Activity to increase anterior-posterior weight shifting first with use of ACE wrap around L knee and ankle for facilitation of ankle DF and to provide tactile/proprioceptive feedback and resistance for increased knee control and decreased genu recurvatum during gait x 100' with RW with hand orthosis and then during 12 stair negotiation x 2 reps with bilat UE support on rails with focus on maintaining upright trunk, L foot clearance, increased forward weight shift over LLE in stance and increased knee control in stance.  Changed to use of NMES on L anterior tibialis muscle to facilitate activation of L ankle DF during isolated ankle PF <> DF with foot on balance disc >> gait with NMES with focus on activation of ankle DF during swing and initial stance phase with mod A.     See Function Navigator for Current Functional Status.   Therapy/Group: Individual Therapy  Raylene Everts [facility] 07/19/2016, 12:18 PM

## 2016-07-19 NOTE — IPOC Note (Signed)
Overall Plan of Care [facility]) Patient Details Name: Jason Oconnor MRN: [id number] DOB: 1956/10/17  Admitting Diagnosis: CVA  Hospital Problems: Principal Problem:   Right basal ganglia embolic stroke [facility]) Active Problems:   Weakness of left side of body   Hemiparesis affecting nondominant side as late effect of cerebrovascular accident (CVA) ([place])     Functional Problem List: Nursing Safety, Edema  PT Balance, Motor, Sensory  OT Balance, Motor, Perception, Safety, Sensory, Skin Integrity  SLP    TR         Basic ADL's: OT Eating, Grooming, Bathing, Dressing, Toileting     Advanced  ADL's: OT Simple Meal Preparation     Transfers: PT Bed Mobility, Bed to Chair, Car, Manufacturing systems engineer, Metallurgist: PT Ambulation, Emergency planning/management officer, Stairs     Additional Impairments: OT Fuctional Use of Upper Extremity  SLP        TR      Anticipated Outcomes Item Anticipated Outcome  Self Feeding Mod I  Swallowing      Basic self-care  Mod I  Toileting  Mod I   Bathroom Transfers supervision shower transfers, Mod I toilet  Bowel/Bladder  Mod I  Transfers  Mod I  Locomotion  Mod I  Communication     Cognition     Pain  <3  Safety/Judgment  Supervision   Therapy Plan: PT Intensity: Minimum of 1-2 x/day ,45 to 90 minutes PT Frequency: 5 out of 7 days PT Duration Estimated Length of Stay: 2.5-3 weeks OT Intensity: Minimum of 1-2 x/day, 45 to 90 minutes OT Frequency: 5 out of 7 days OT Duration/Estimated Length of Stay: 17-21 days         Team Interventions: Nursing Interventions Disease Management/Prevention, Discharge Planning, Psychosocial Support  PT interventions Ambulation/gait training, Training and development officer, Community reintegration, Discharge planning, Disease management/prevention, DME/adaptive equipment instruction, Functional electrical stimulation, Functional mobility training, Neuromuscular re-education, Patient/family  education, Splinting/orthotics, Stair training, Therapeutic Activities, Therapeutic Exercise, UE/LE Strength taining/ROM, UE/LE Coordination activities, Wheelchair propulsion/positioning  OT Interventions Training and development officer, Academic librarian, Discharge planning, Engineer, drilling, Functional electrical stimulation, Neuromuscular re-education, Patient/family education, Psychosocial support, Self Care/advanced ADL retraining, Therapeutic Activities, Therapeutic Exercise, UE/LE Strength taining/ROM, UE/LE Coordination activities  SLP Interventions    TR Interventions    SW/CM Interventions Discharge Planning, Psychosocial Support, Patient/Family Education    Team Discharge Planning: Destination: PT-Home ,OT- Home , SLP-  Projected Follow-up: PT-Outpatient PT, OT-  Outpatient OT, SLP-  Projected Equipment Needs: PT-To be determined, OT- 3 in 1 bedside comode, Tub/shower seat, SLP-  Equipment Details: PT- , OT-  Patient/family involved in discharge planning: PT- Patient, Family member/caregiver,  OT-Patient, Family member/caregiver, SLP-   MD ELOS: 14-17d Medical Rehab Prognosis:  Excellent Assessment: 60 year old right-handed male with history of hypertension. Per chart review patient is married. Independent prior to admission working for [employer]. One level with one-step entry. Presented to [facility] 07/14/2016 with left-sided weakness and facial droop. Blood pressure elevated 170/106. Cranial CT scan negative. 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 CTA of the head showed no emergent large vessel occlusion or severe stenosis. Carotid Dopplers with no ICA stenosis. Patient did not receive TPA. Echocardiogram with ejection fraction of 60%. Normal systolic function. Placed on aspirin and Plavix for CVA prophylaxis    Now requiring 24/7 Rehab RN,MD, as well as CIR level  PT, OT and SLP.  Treatment  team will focus on ADLs and mobility with goals set at See Team Conference Notes for weekly updates to the plan of care

## 2016-07-19 NOTE — Progress Notes (Signed)
Occupational Therapy Session Note  Patient Details  Name: Jason Oconnor MRN: [id number] Date of Birth: 02/01/1956  Today's Date: 07/19/2016 OT Individual Time: [id number] and 1403-1500 OT Individual Time Calculation (min): 47 min and 57 min    Short Term Goals:Week 1:  OT Short Term Goal 1 (Week 1): Pt will complete toilet transfer with Min assist with LRAD OT Short Term Goal 2 (Week 1): Pt will complete LB dressing with min assist OT Short Term Goal 3 (Week 1): Pt will complete 2 grooming tasks in standing with min assist for standing balance OT Short Term Goal 4 (Week 1): Pt will utilize LUE as gross assist with min cues during functional activity  Skilled Therapeutic Interventions/Progress Updates:    1) Treatment session with focus on functional transfers, standing balance, and functional use of LUE during self-care tasks.  Pt in bed upon arrival, ambulated to toilet with RW and mod assist with facilitation to ensure proper LLE placement and cues at hip for control.  Toileting completed with lateral leans for hygiene.  Ambulated from toilet to walk-in shower with RW and min-mod assist with cues for sequencing of transfer.  Bathing completed at sit > stand level in room shower with therapist providing multimodal cues for LLE weightbearing to improve midline standing while washing buttocks.  Pt attempted to utilize LUE to wash Rt arm with pt able to wash forearm but not enough horizontal adduction and shoulder flexion to wash upper arm.  Dressing completed at sit > stand level with cues to cross LLE over Rt knee to increase success with threading underwear and shorts leg.  Constant cues to remove LUE from grip on RW prior to sitting, question mild impulsivity vs inattention.  Utilized LUE as gross assist with holding deodorant and shaving cream in Lt hand to open cap with Rt hand.  2) Treatment session with focus on LUE NMR with use of Bioness. Setup Bioness on Lt hand with focus on grasp and  release and key grasp.  Engaged in repetitions of finger flexion/extension with focus on increased motor recovery.  Pt with no skin irritation or pain post Bioness use.  Once Bioness removed, engaged in grasp/release with grasping squishy cone to allow for increased grasp while reaching across midline and out to side to challenge grasp in abduction and adduction patterns.  Pt with decreased grasp when reaching away from body.  Provided with fine motor control HEP with focus on finger isolation movements with grasp and finger flexion/extension.  Educated on use of BUE to complete self-ROM and AAROM as precursor to exercise.  Returned to room and left semi-reclined in bed.  Therapy Documentation Precautions:  Precautions Precautions: Fall Precaution Comments: Monitor vitals Restrictions Weight Bearing Restrictions: No General:   Vital Signs: Therapy Vitals Temp: 98.1 F (36.7 C) Temp Source: Oral Pulse Rate: 78 Resp: 18 BP: (!) 127/96 Patient Position (if appropriate): Lying Oxygen Therapy SpO2: 97 % O2 Device: Not Delivered Pain:  Pt with no c/o pain  See Function Navigator for Current Functional Status.   Therapy/Group: Individual Therapy  Simonne Come 07/19/2016, 9:06 AM

## 2016-07-19 NOTE — Progress Notes (Addendum)
Subjective/Complaints:  No issues overnite except some soreness in hip, pt feels like he was straining during PT  Review of systems negative for chest pains, shortness of breath, nausea, vomiting, diarrhea, constipation, urinary or bowel incontinence.  Objective: Vital Signs: Blood pressure (!) 127/96, pulse 78, temperature 98.1 F (36.7 C), temperature source Oral, resp. rate 18, height 6' 4"  (1.93 m), weight 102.1 kg (225 lb), SpO2 97 %. No results found. Results for orders placed or performed during the hospital encounter of 07/16/16 (from the past 72 hour(s))  CBC     Status: None   Collection Time: 07/16/16  4:38 PM  Result Value Ref Range   WBC 6.9 4.0 - 10.5 K/uL   RBC 4.74 4.22 - 5.81 MIL/uL   Hemoglobin 14.0 13.0 - 17.0 g/dL   HCT 42.5 39.0 - 52.0 %   MCV 89.7 78.0 - 100.0 fL   MCH 29.5 26.0 - 34.0 pg   MCHC 32.9 30.0 - 36.0 g/dL   RDW 13.1 11.5 - 15.5 %   Platelets 247 150 - 400 K/uL  Creatinine, serum     Status: None   Collection Time: 07/16/16  4:38 PM  Result Value Ref Range   Creatinine, Ser 1.04 0.61 - 1.24 mg/dL   GFR calc non Af Amer >60 >60 mL/min   GFR calc Af Amer >60 >60 mL/min    Comment: (NOTE) The eGFR has been calculated using the CKD EPI equation. This calculation has not been validated in all clinical situations. eGFR's persistently <60 mL/min signify possible Chronic Kidney Disease.      HEENT: normal Cardio: RRR and No murmurs Resp: CTA B/L and Unlabored GI: BS positive and Nontender, nondistended Extremity:  No Edema Skin:   Intact and Other Evidence of eczema. Facial Neuro: Alert/Oriented, Cranial Nerve Abnormalities Left central 7, Normal Sensory, Abnormal Motor 3 minus deltoid, biceps 2 minus, triceps 2 minus, finger flexion, extension 3 minus. Hip flexion 4 minus, knee extension, trace ankle dorsiflexion as well as toe flexion, extension and Tone:  Within Normal Limits Musc/Skel:  Normal Gen. no acute  distress   Assessment/Plan: 1. 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 Physiatrist is providing close team supervision and 24 hour management of active medical problems listed below. Physiatrist and rehab team continue to assess barriers to discharge/monitor patient progress toward functional and medical goals. FIM: Function - Bathing Position: Shower Body parts bathed by patient: Left arm, Chest, Abdomen, Front perineal area, Right upper leg, Left upper leg Body parts bathed by helper: Right arm, Buttocks Bathing not applicable: Right lower leg, Left lower leg Assist Level:  (Mod assist)  Function- Upper Body Dressing/Undressing What is the patient wearing?: Pull over shirt/dress Pull over shirt/dress - Perfomed by patient: Thread/unthread right sleeve, Put head through opening, Pull shirt over trunk, Thread/unthread left sleeve Pull over shirt/dress - Perfomed by helper: Thread/unthread left sleeve Assist Level: Supervision or verbal cues, Set up Set up : To obtain clothing/put away Function - Lower Body Dressing/Undressing What is the patient wearing?: Underwear, Pants, Shoes, Ted Hose Position: Sitting EOB Underwear - Performed by patient: Thread/unthread right underwear leg, Pull underwear up/down Underwear - Performed by helper: Thread/unthread left underwear leg Pants- Performed by patient: Thread/unthread right pants leg, Pull pants up/down Pants- Performed by helper: Thread/unthread left pants leg Shoes - Performed by patient: Don/doff right shoe  Shoes - Performed by helper: Don/doff left shoe TED Hose - Performed by helper: Don/doff right TED hose, Don/doff left TED hose Assist for footwear: Partial/moderate assist Assist for lower body dressing:  (Mod assist)  Function - Toileting Toileting  steps completed by patient: Adjust clothing prior to toileting, Performs perineal hygiene, Adjust clothing after toileting Toileting Assistive Devices: Grab bar or rail Assist level: Touching or steadying assistance (Pt.75%)  Function - Air cabin crew transfer assistive device: Elevated toilet seat/BSC over toilet Assist level to toilet: Moderate assist (Pt 50 - 74%/lift or lower) Assist level from toilet: Moderate assist (Pt 50 - 74%/lift or lower)  Function - Chair/bed transfer Chair/bed transfer method: Stand pivot Chair/bed transfer assist level: Moderate assist (Pt 50 - 74%/lift or lower) Chair/bed transfer assistive device: Armrests, Walker  Function - Locomotion: Wheelchair Type: Manual Max wheelchair distance: 150 Assist Level: Supervision or verbal cues Assist Level: Supervision or verbal cues Assist Level: Supervision or verbal cues Turns around,maneuvers to table,bed, and toilet,negotiates 3% grade,maneuvers on rugs and over doorsills: No Function - Locomotion: Ambulation Assistive device: Walker-rolling, Orthosis Max distance: 52f Assist level: Moderate assist (Pt 50 - 74%) Assist level: Moderate assist (Pt 50 - 74%) Assist level: Moderate assist (Pt 50 - 74%) Walk 150 feet activity did not occur: Safety/medical concerns Walk 10 feet on uneven surfaces activity did not occur: Safety/medical concerns Assist level: Maximal assist (Pt 25 - 49%)  Function - Comprehension Comprehension: Auditory Comprehension assist level: Follows complex conversation/direction with extra time/assistive device  Function - Expression Expression: Verbal Expression assist level: Expresses complex ideas: With extra time/assistive device  Function - Social Interaction Social Interaction assist level: Interacts appropriately with others - No medications needed.  Function - Problem Solving Problem solving assist level: Solves basic 90% of the time/requires cueing < 10% of the  time  Function - Memory Memory assist level: Recognizes or recalls 90% of the time/requires cueing < 10% of the time Patient normally able to recall (first 3 days only): Current season, Staff names and faces, That he or she is in a hospital  Medical Problem List and Plan: 1.  Left-sided weakness secondary to right posterior basal ganglia infarct                       -PT, OT CIR level 2.  DVT Prophylaxis/Anticoagulation: Subcutaneous Lovenox. Monitor platelet counts and any signs of bleeding, last PLT 274K 3. Pain Management: Tylenol as needed 4. Hypertension. Norvasc 2.5 mg daily. Monitor with increased mobility Vitals:   07/18/16 1318 07/19/16 0551  BP: 139/83 (!) 127/96  Pulse: 75 78  Resp: 18 18  Temp: 98 F (36.7 C) 98.1 F (36.7 C)   5. Neuropsych: This patient is capable of making decisions on his own behalf. 6. Skin/Wound Care: Routine skin checks upon admit 7. Fluids/Electrolytes/Nutrition: Routine I&O's with follow-up chemistries as needed. [facility] for [facility] 8. GERD. Protonix- change to pepcid to minimize interference with plavix 9. Elevated lipids: apparently has an allergy to lipitor--"clots". Will need to review alternative choices/safety of these meds                       -dietary choices and exercise to be reinforced, Total cholesterol 205, LDL 138, may be able to bring this down with diet only heart healthy diet  LOS (Days) 3 A FACE TO FACE EVALUATION WAS PERFORMED   KAlysia PennaE  07/19/2016, 8:50 AM

## 2016-07-20 ENCOUNTER — Encounter (HOSPITAL_COMMUNITY): Payer: 59 | Admitting: *Deleted

## 2016-07-20 DIAGNOSIS — M21372 Foot drop, left foot: Secondary | ICD-10-CM

## 2016-07-20 DIAGNOSIS — I10 Essential (primary) hypertension: Secondary | ICD-10-CM

## 2016-07-20 MED ORDER — AMLODIPINE BESYLATE 5 MG PO TABS
5.0000 mg | ORAL_TABLET | Freq: Every evening | ORAL | Status: DC
  Administered 2016-07-20 – 2016-08-02 (×14): 5 mg via ORAL
  Filled 2016-07-20 (×14): qty 1

## 2016-07-20 NOTE — Progress Notes (Signed)
Subjective/Complaints: Pt seen laying in bed.  He has questions listed out, regarding his goals and emotionally liability.    Review of systems negative for chest pains, shortness of breath, nausea, vomiting, diarrhea, constipation, urinary or bowel incontinence.  Objective: Vital Signs: Blood pressure (!) 141/86, pulse 65, temperature 98.2 F (36.8 C), temperature source Oral, resp. rate 18, height 6\' 4"  (1.93 m), weight 102.1 kg (225 lb), SpO2 97 %. No results found. No results found for this or any previous visit (from the past 72 hour(s)).   HEENT: normocephalic, atraumatic Cardio: RRR and No murmurs Resp: CTA B/L and Unlabored GI: BS positive and Nontender, nondistended Skin:   Facial eczema.  Neuro: Alert/Oriented, Cranial Nerve Abnormalities Left central 7 Motor LUE: 4/5 deltoid, biceps 3/5, triceps 3/5, finger flexion, extension.  LLE: Hip flexion 4/5, knee extension, 1+/5 ankle dorsiflexion  Tone:  Within Normal Limits Musc/Skel:  No edema.  No tenderness. Gen. no acute distress. Vital signs reviewed.   Assessment/Plan: 1. 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 Physiatrist is providing close team supervision and 24 hour management of active medical problems listed below. Physiatrist and rehab team continue to assess barriers to discharge/monitor patient progress toward functional and medical goals. FIM: Function - Bathing Position: Shower Body parts bathed by patient: Left arm, Chest, Abdomen, Front perineal area, Right upper leg, Left upper leg, Right lower leg, Left lower leg Body parts bathed by helper: Back, Buttocks, Right arm Bathing not applicable: Right lower leg, Left lower leg Assist Level: Touching or steadying assistance(Pt > 75%) (Min assist)  Function- Upper Body  Dressing/Undressing What is the patient wearing?: Pull over shirt/dress Pull over shirt/dress - Perfomed by patient: Thread/unthread right sleeve, Put head through opening, Pull shirt over trunk, Thread/unthread left sleeve Pull over shirt/dress - Perfomed by helper: Thread/unthread left sleeve Assist Level: Supervision or verbal cues, Set up Set up : To obtain clothing/put away Function - Lower Body Dressing/Undressing What is the patient wearing?: Underwear, Pants, Shoes, Ted Hose Position: Wheelchair/chair at [employer] - Performed by patient: Thread/unthread right underwear leg, Pull underwear up/down Underwear - Performed by helper: Thread/unthread left underwear leg Pants- Performed by patient: Thread/unthread right pants leg, Thread/unthread left pants leg, Pull pants up/down Pants- Performed by helper: Thread/unthread left pants leg Shoes - Performed by patient: Don/doff right shoe, Don/doff left shoe Shoes - Performed by helper: Don/doff left shoe TED Hose - Performed by helper: Don/doff right TED hose, Don/doff left TED hose Assist for footwear: Partial/moderate assist Assist for lower body dressing: Touching or steadying assistance (Pt > 75%) (min assist)  Function - Toileting Toileting steps completed by patient: Adjust clothing prior to toileting, Performs perineal hygiene, Adjust clothing after toileting Toileting Assistive Devices: Grab bar or rail Assist level: Touching or steadying assistance (Pt.75%)  Function - Air cabin crew transfer assistive device: Grab bar, Walker Assist level to toilet: Moderate assist (Pt 50 - 74%/lift or lower) Assist level from toilet: Moderate assist (Pt 50 - 74%/lift or lower)  Function - Chair/bed transfer Chair/bed transfer method: Stand pivot Chair/bed transfer assist level: Moderate assist (Pt 50 - 74%/lift or lower) Chair/bed transfer assistive device: Armrests  Function - Locomotion: Wheelchair Type: Manual Max  wheelchair distance: 150 Assist Level: Touching or steadying assistance (Pt > 75%) Assist Level: Touching or steadying assistance (Pt > 75%) Assist Level: Touching or  steadying assistance (Pt > 75%) Turns around,maneuvers to table,bed, and toilet,negotiates 3% grade,maneuvers on rugs and over doorsills: No Function - Locomotion: Ambulation Assistive device: Walker-rolling, Orthosis Max distance: 100 Assist level: Moderate assist (Pt 50 - 74%) Assist level: Moderate assist (Pt 50 - 74%) Assist level: Moderate assist (Pt 50 - 74%) Walk 150 feet activity did not occur: Safety/medical concerns Walk 10 feet on uneven surfaces activity did not occur: Safety/medical concerns Assist level: Maximal assist (Pt 25 - 49%)  Function - Comprehension Comprehension: Auditory Comprehension assist level: Follows basic conversation/direction with no assist  Function - Expression Expression: Verbal Expression assist level: Expresses complex ideas: With no assist  Function - Social Interaction Social Interaction assist level: Interacts appropriately with others - No medications needed.  Function - Problem Solving Problem solving assist level: Solves basic 90% of the time/requires cueing < 10% of the time  Function - Memory Memory assist level: Recognizes or recalls 90% of the time/requires cueing < 10% of the time Patient normally able to recall (first 3 days only): Current season, Staff names and faces, That he or she is in a hospital  Medical Problem List and Plan: 1.  Left-sided weakness secondary to right posterior basal ganglia infarct on 8/20  Cont CIR  PRAFO ordered 2.  DVT Prophylaxis/Anticoagulation: Subcutaneous Lovenox. Monitor platelet counts and any signs of bleeding 3. Pain Management: Tylenol as needed 4. Hypertension. Norvasc 2.5 mg daily, increased to 5mg  on 8/26  Monitor with increased mobility Vitals:   07/19/16 1320 07/20/16 0519  BP: (!) 153/93 (!) 141/86  Pulse: 80 65   Resp: 16 18  Temp: 98.1 F (36.7 C) 98.2 F (36.8 C)   5. Neuropsych: This patient is capable of making decisions on his own behalf. 6. Skin/Wound Care: Routine skin checks upon admit 7. Fluids/Electrolytes/Nutrition: Routine I&O's  8. GERD. Protonix- changed to pepcid  9. Elevated lipids: apparently has an allergy to lipitor--"clots". Will need to review alternative choices/safety of these meds                       -dietary choices and exercise to be reinforced, Total cholesterol 205, LDL 138, may be able to bring this down with diet only heart healthy diet  LOS (Days) 4 A FACE TO FACE EVALUATION WAS PERFORMED   Harshini Trent Lorie Phenix 07/20/2016, 9:50 AM

## 2016-07-20 NOTE — Progress Notes (Signed)
Occupational Therapy Note  Patient Details  Name: Jason Oconnor MRN: [id number] Date of Birth: 04/10/1956  Today's Date: 07/20/2016 OT Concurrent Time: 1305-1400 OT Concurrent Time Calculation (min): 55 min   Pt denied pain Concurrent Therapy  Pt resting in bed upon arrival.  Pt performed stand pivot transfer to w/c with steady A.  Pt engaged in gross motor activities with focus on isolated shoulder, elbow, and wrist movements.  Pt required max verbal cues to isolate movements and breathe during tasks.  Pt instructed on activities/movements to perform while in room.  Pt returned to room and requested use of bathroom.  Pt performed stand pivot transfer to toilet and doffed pants at steady A level.  Pt remained in bathroom with instructions to call staff when finished.  Staff notified.  Leotis Shames [facility] 07/20/2016, 2:55 PM

## 2016-07-20 NOTE — Progress Notes (Signed)
Orthopedic Tech Progress Note Patient Details:  Jason Oconnor Jun 05, 1956 [id number]  Patient ID: Jason Oconnor, male   DOB: 06/13/1956, 60 y.o.   MRN: [id number]   Jason Oconnor 07/20/2016, 10:10 AMCalled [employer] for left PRAFO brace.

## 2016-07-21 ENCOUNTER — Inpatient Hospital Stay (HOSPITAL_COMMUNITY): Payer: 59 | Admitting: Occupational Therapy

## 2016-07-21 ENCOUNTER — Encounter (HOSPITAL_COMMUNITY): Payer: 59 | Admitting: Physical Therapy

## 2016-07-21 ENCOUNTER — Inpatient Hospital Stay (HOSPITAL_COMMUNITY): Payer: 59 | Admitting: Physical Therapy

## 2016-07-21 NOTE — Progress Notes (Signed)
Subjective/Complaints: Pt seen laying in bed.  He states he slept well, although had some difficulty with positioning due to the [facility].   Review of systems negative for chest pains, shortness of breath, nausea, vomiting, diarrhea, constipation, urinary or bowel incontinence.  Objective: Vital Signs: Blood pressure 136/88, pulse 70, temperature 98.1 F (36.7 C), temperature source Oral, resp. rate 18, height 6\' 4"  (1.93 m), weight 102.1 kg (225 lb), SpO2 97 %. No results found. No results found for this or any previous visit (from the past 72 hour(s)).   HEENT: normocephalic, atraumatic Cardio: RRR and No murmurs Resp: CTA B/L and Unlabored GI: BS positive and Nontender, nondistended Skin:   Facial eczema.  Neuro: Alert/Oriented, Cranial Nerve Abnormalities Left central 7 Motor LUE: 4/5 deltoid, biceps 4-/5, triceps 4-/5, finger flexion, extension.  LLE: Hip flexion 4/5, knee extension, 1+/5 ankle dorsiflexion  Tone:  Within Normal Limits Musc/Skel:  No edema.  No tenderness. Gen. no acute distress. Vital signs reviewed.   Assessment/Plan: 1. 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 Physiatrist is providing close team supervision and 24 hour management of active medical problems listed below. Physiatrist and rehab team continue to assess barriers to discharge/monitor patient progress toward functional and medical goals. FIM: Function - Bathing Position: Shower Body parts bathed by patient: Left arm, Chest, Abdomen, Front perineal area, Right upper leg, Left upper leg, Right lower leg, Left lower leg Body parts bathed by helper: Back, Buttocks, Right arm Bathing not applicable: Right lower leg, Left lower leg Assist Level: Touching or steadying assistance(Pt > 75%) (Min assist)  Function- Upper  Body Dressing/Undressing What is the patient wearing?: Pull over shirt/dress Pull over shirt/dress - Perfomed by patient: Thread/unthread right sleeve, Put head through opening, Pull shirt over trunk, Thread/unthread left sleeve Pull over shirt/dress - Perfomed by helper: Thread/unthread left sleeve Assist Level: Supervision or verbal cues, Set up Set up : To obtain clothing/put away Function - Lower Body Dressing/Undressing What is the patient wearing?: Underwear, Pants, Shoes, Ted Hose Position: Wheelchair/chair at [employer] - Performed by patient: Thread/unthread right underwear leg, Pull underwear up/down Underwear - Performed by helper: Thread/unthread left underwear leg Pants- Performed by patient: Thread/unthread right pants leg, Thread/unthread left pants leg, Pull pants up/down Pants- Performed by helper: Thread/unthread left pants leg Shoes - Performed by patient: Don/doff right shoe, Don/doff left shoe Shoes - Performed by helper: Don/doff left shoe TED Hose - Performed by helper: Don/doff right TED hose, Don/doff left TED hose Assist for footwear: Partial/moderate assist Assist for lower body dressing: Touching or steadying assistance (Pt > 75%) (min assist)  Function - Toileting Toileting steps completed by patient: Adjust clothing prior to toileting, Performs perineal hygiene, Adjust clothing after toileting Toileting Assistive Devices: Grab bar or rail Assist level: Touching or steadying assistance (Pt.75%)  Function - Air cabin crew transfer assistive device: Grab bar, Walker Assist level to toilet: Moderate assist (Pt 50 - 74%/lift or lower) Assist level from toilet: Moderate assist (Pt 50 - 74%/lift or lower)  Function - Chair/bed transfer Chair/bed transfer method: Stand pivot Chair/bed transfer assist level: Moderate assist (Pt 50 - 74%/lift or lower) Chair/bed transfer assistive device: Armrests  Function - Locomotion: Wheelchair Type: Manual Max  wheelchair distance: 150 Assist Level: Touching or steadying assistance (Pt > 75%) Assist Level: Touching or steadying assistance (Pt > 75%) Assist Level:  Touching or steadying assistance (Pt > 75%) Turns around,maneuvers to table,bed, and toilet,negotiates 3% grade,maneuvers on rugs and over doorsills: No Function - Locomotion: Ambulation Assistive device: Walker-rolling, Orthosis Max distance: 100 Assist level: Moderate assist (Pt 50 - 74%) Assist level: Moderate assist (Pt 50 - 74%) Assist level: Moderate assist (Pt 50 - 74%) Walk 150 feet activity did not occur: Safety/medical concerns Walk 10 feet on uneven surfaces activity did not occur: Safety/medical concerns Assist level: Maximal assist (Pt 25 - 49%)  Function - Comprehension Comprehension: Auditory Comprehension assist level: Follows basic conversation/direction with no assist  Function - Expression Expression: Verbal Expression assist level: Expresses complex ideas: With no assist  Function - Social Interaction Social Interaction assist level: Interacts appropriately with others - No medications needed.  Function - Problem Solving Problem solving assist level: Solves basic 90% of the time/requires cueing < 10% of the time  Function - Memory Memory assist level: Recognizes or recalls 90% of the time/requires cueing < 10% of the time Patient normally able to recall (first 3 days only): Current season, Staff names and faces, That he or she is in a hospital, Location of own room  Medical Problem List and Plan: 1.  Left-sided weakness secondary to right posterior basal ganglia infarct on 8/20  Cont CIR  PRAFO qHS 2.  DVT Prophylaxis/Anticoagulation: Subcutaneous Lovenox. Monitor platelet counts and any signs of bleeding 3. Pain Management: Tylenol as needed 4. Hypertension: Improved  Norvasc 2.5 mg daily, increased to 5mg  on 8/26  Monitor with increased mobility Vitals:   07/20/16 1451 07/21/16 0552  BP: (!) 141/86  136/88  Pulse: 81 70  Resp: 18 18  Temp: 98.2 F (36.8 C) 98.1 F (36.7 C)   5. Neuropsych: This patient is capable of making decisions on his own behalf. 6. Skin/Wound Care: Routine skin checks upon admit 7. Fluids/Electrolytes/Nutrition: Routine I&O's  8. GERD. Protonix- changed to pepcid  9. Elevated lipids: apparently has an allergy to lipitor--"clots". Will need to review alternative choices/safety of these meds                       -dietary choices and exercise to be reinforced, Total cholesterol 205, LDL 138, may be able to bring this down with diet only heart healthy diet  LOS (Days) 5 A FACE TO FACE EVALUATION WAS PERFORMED   Aliyah Abeyta Lorie Phenix 07/21/2016, 8:13 AM

## 2016-07-21 NOTE — Progress Notes (Signed)
Physical Therapy Session Note  Patient Details  Name: Jason Oconnor MRN: [id number] Date of Birth: 01/06/1956  Today's Date: 07/21/2016 PT Group Time: 1345-1430 PT Group Time Calculation (min): 45 min   Short Term Goals: Week 1:  PT Short Term Goal 1 (Week 1): Pt will perform bed mobility on flat bed with supervision PT Short Term Goal 2 (Week 1): Pt will perform bed <> chair transfers with LRAD and consistent mod A PT Short Term Goal 3 (Week 1): Pt will perform gait in controlled environment x 100' with LRAD and consistent mod A  PT Short Term Goal 4 (Week 1): Pt will perform stair negotiation up/down 8 stairs with 2 rails with mod A for LE strengthening PT Short Term Goal 5 (Week 1): Pt will perform w/c mobility x 150' with supervision and maintain trunk and hip position in w/c  Skilled Therapeutic Interventions/Progress Updates:    Pt received in therapy gym for UE NMR group session.  NMR for fine motor control in LUE placing batteries in Wii remote and controlling Wii with LUE.  Pt required more than a reasonable amount of time and multiple attempts to place batteries in remote using RUE to stabilize and LUE to manipulate batteries. Pt required hand over hand assist faded to supervision to manipulate Wii remote to bowl.  Pt escorted back to room in w/c by rehab tech at end of session.  Therapy Documentation Precautions:  Precautions Precautions: Fall Precaution Comments: Monitor vitals Restrictions Weight Bearing Restrictions: No   See Function Navigator for Current Functional Status.   Therapy/Group: Group Therapy  Deonne Rooks E Penven-Crew 07/21/2016, 3:32 PM

## 2016-07-21 NOTE — Progress Notes (Addendum)
Physical Therapy Session Note  Patient Details  Name: Jason Oconnor MRN: [id number] Date of Birth: 24-Feb-1956  Today's Date: 07/24/2016 PT Individual Time: [id number] and 1515-1545 PT Individual Time Calculation (min): 58 min and 30 min   Short Term Goals: Week 1:  PT Short Term Goal 1 (Week 1): Pt will perform bed mobility on flat bed with supervision PT Short Term Goal 2 (Week 1): Pt will perform bed <> chair transfers with LRAD and consistent mod A PT Short Term Goal 3 (Week 1): Pt will perform gait in controlled environment x 100' with LRAD and consistent mod A  PT Short Term Goal 4 (Week 1): Pt will perform stair negotiation up/down 8 stairs with 2 rails with mod A for LE strengthening PT Short Term Goal 5 (Week 1): Pt will perform w/c mobility x 150' with supervision and maintain trunk and hip position in w/c  Skilled Therapeutic Interventions/Progress Updates:    Treatment 1: Pt received in w/c & agreeable to PT, noting 0/10 pain at rest but 3/10 pain in posterior neck with activity. Pt ambulated room>gym (~125 ft) with RW & Min A with therapist providing manual facilitation for increased weight shift to L but pt able to maintain hand hold on RW with LUE without assistance. Utilized cybex kinetron in standing with Min A for balance and LUE hand hold on rail; provided tactile cuing for upright posture and manual facilitation for weight shift L. Pt transferred onto mat and performed hamstring exercise (transferring from short kneeling<>tall kneeling) with focus on squeezing glutes during exercise and manual facilitation for increased weight shift L. In supine, pt performed bridging with adductor hold and single leg bridge (LLE) focusing on concentric and eccentric control with exercise. Utilized nu-step on level 6 x 5 minutes with LUE attachment for reciprocal movements, cardiovascular endurance training and strengthening. Pt ambulated gym>room with RW & min A in same manner as noted above. At end  of session pt left sitting in recliner with all needs within reach.   Treatment 2: Pt received in bed & agreeable to PT. Session focused on transfer (car & sit<>stand) and gait training. Pt able to transfer sit<>supine in bed with rails and HOB elevated with supervision A. Gait training x 312 ft + 312 ft (room<>ortho gym) with [facility] with therapist providing manual facilitation for weight shifting L. Pt with decreased foot clearance LLE and occasional decreased step length, but this improves with verbal cuing. Pt with decreasing quality of gait with increasing fatigue. Pt performed car transfer with RW & min A from low SUV elevated height. Pt utilizes UE to transfer LLE in/out of car. At end of session pt left in bed with all needs within reach.   Therapy Documentation Precautions:  Precautions Precautions: Fall Precaution Comments: Monitor vitals Restrictions Weight Bearing Restrictions: No    See Function Navigator for Current Functional Status.   Therapy/Group: Individual Therapy  Waunita Schooner 07/24/2016, 12:22 PM

## 2016-07-21 NOTE — Progress Notes (Signed)
Physical Therapy Session Note  Patient Details  Name: Jason Oconnor MRN: [id number] Date of Birth: [birth year]  Today's Date: 07/21/2016 PT Individual Time: 1130-1215 PT Individual Time Calculation (min): 45 min    Short Term Goals: Week 1:  PT Short Term Goal 1 (Week 1): Pt will perform bed mobility on flat bed with supervision PT Short Term Goal 2 (Week 1): Pt will perform bed <> chair transfers with LRAD and consistent mod A PT Short Term Goal 3 (Week 1): Pt will perform gait in controlled environment x 100' with LRAD and consistent mod A  PT Short Term Goal 4 (Week 1): Pt will perform stair negotiation up/down 8 stairs with 2 rails with mod A for LE strengthening PT Short Term Goal 5 (Week 1): Pt will perform w/c mobility x 150' with supervision and maintain trunk and hip position in w/c  Skilled Therapeutic Interventions/Progress Updates:    Pt was seen bedside in the am. Pt performed multiple sit to stand transfers with rolling walker and min A. Pt ambulated 15 with rolling walker and min A with verbal cues, genu recurvatum noted during stance phase on L LE. Pt transported to rehab gym. Pt ambulated 75 feet with rolling walker, L hand orthosis and L PLS. Pt ambulated with min A and verbal cues. Pt ambulated 75 feet x 2 with rolling walker, L hand orthosis, L PLS and 2 lbs L LE for increased proprioceptive input. Pt ambulated with min A and verbal cues with decreased recurvatum noted with L PLS AFO. Improved gait pattern and control noted with 2 lb weight. Pt ambulated 50 feet with WBQC, L PLS AFO and 2 lbs on L LE with min A and verbal cues. Pt ambulated 50 feet with WBQC and min A, increased L genu recurvatum noted without use of PLS AFO. Pt returned to room. Pt ambulated about 15 feet with rolling walker and min A with verbal cues. Pt left sitting up in recliner with call bell within reach.   Therapy Documentation Precautions:  Precautions Precautions: Fall Precaution Comments: Monitor  vitals Restrictions Weight Bearing Restrictions: No General:   Pain: No c/o pain.   See Function Navigator for Current Functional Status.   Therapy/Group: Individual Therapy  Dub Amis 07/21/2016, 12:48 PM

## 2016-07-21 NOTE — Progress Notes (Signed)
Physical Therapy Session Note  Patient Details  Name: Jason Oconnor MRN: [id number] Date of Birth: [birth year]  Today's Date: 07/21/2016 PT Individual Time: 0800-0900 PT Individual Time Calculation (min): 60 min    Short Term Goals: Week 1:  PT Short Term Goal 1 (Week 1): Pt will perform bed mobility on flat bed with supervision PT Short Term Goal 2 (Week 1): Pt will perform bed <> chair transfers with LRAD and consistent mod A PT Short Term Goal 3 (Week 1): Pt will perform gait in controlled environment x 100' with LRAD and consistent mod A  PT Short Term Goal 4 (Week 1): Pt will perform stair negotiation up/down 8 stairs with 2 rails with mod A for LE strengthening PT Short Term Goal 5 (Week 1): Pt will perform w/c mobility x 150' with supervision and maintain trunk and hip position in w/c  Skilled Therapeutic Interventions/Progress Updates:    Pt was seen bedside in the am. Pt transferred supine to edge of bed with side rail and S with increased time. Pt transferred sit to stand with min A and verbal cues. Pt performed stand pivot transfer with min to mod A and verbal cues. Pt propelled w/c about 150 feet with B LEs and S with increased time. Pt ambulated 50 feet with rolling walker and L hand orthosis with min to mod A and verbal cues. Pt ambulated 50 feet x 2 with rolling walker, L hand orthosis, and L ankle weight for increased proprioceptive input (2 lbs). Pt required min to mod A and verbal cues. Treatment focused on NMR utilizing step taps 3 sets x 10 reps each. Pt performed block training sit to stand transfers, 3 sets x 5 reps each and 3 sets x 5 reps each with 2" platform under R foot. Pt returned to room. Pt transferred w/c to commode and commode to w/c with mod A and verbal cues. Pt transferred w/c to recliner with min to mod A and verbal cues. Pt left sitting up in recliner with call bell within reach.   Therapy Documentation Precautions:  Precautions Precautions: Fall Precaution  Comments: Monitor vitals Restrictions Weight Bearing Restrictions: No General:   Vital Signs:   Pain: Pt c/o 3/10 pain L shoulder with movement.   See Function Navigator for Current Functional Status.   Therapy/Group: Individual Therapy  Dub Amis 07/21/2016, 12:40 PM

## 2016-07-21 NOTE — Progress Notes (Signed)
Occupational Therapy Session Note  Patient Details  Name: Jason Oconnor MRN: [id number] Date of Birth: Feb 21, 1956  Today's Date: 07/21/2016 OT Individual Time: [id number] OT Individual Time Calculation (min): 47 min     Short Term Goals: Week 1:  OT Short Term Goal 1 (Week 1): Pt will complete toilet transfer with Min assist with LRAD OT Short Term Goal 2 (Week 1): Pt will complete LB dressing with min assist OT Short Term Goal 3 (Week 1): Pt will complete 2 grooming tasks in standing with min assist for standing balance OT Short Term Goal 4 (Week 1): Pt will utilize LUE as gross assist with min cues during functional activity  Skilled Therapeutic Interventions/Progress Updates:   Pt participated in skilled OT session focusing on neuro re education of L UE during self care completion. Sit<stand from recliner completed with steadying assistance and extra time. Slight LOB to left side with therapist assisting with balance. Cues for fastening/unfastening L hand strap on RW. Shower transfer completed with steadying assistance and instruction on hand placement. While bathing pt able to drape wash cloth over L hand and wash R UE. Cuing provided for WB L UE while standing. After shower, pt completed UB/LB dressing while seated on edge of tub bench. Pt requires cues to slow down due to seated LOB with hurrying through tasks. Grooming and oral care completed at sink with setup. Pt provided resistive sponge to improve grip strength for ADLs. Education emphasized opening palm completely for strengthening extensor muscles. Pt handoff to neuro UE group for therapy. No c/o pain today.   Therapy Documentation Precautions:  Precautions Precautions: Fall Precaution Comments: Monitor vitals Restrictions Weight Bearing Restrictions: No General:   Vital Signs: Therapy Vitals Temp: 98.4 F (36.9 C) Temp Source: Oral Pulse Rate: 95 Resp: 18 BP: (!) 143/90 Patient Position (if appropriate): Lying Oxygen  Therapy SpO2: 95 % O2 Device: Not Delivered     See Function Navigator for Current Functional Status.   Therapy/Group: Individual Therapy  Barbra Miner A Cloma Rahrig 07/21/2016, 4:06 PM

## 2016-07-22 ENCOUNTER — Inpatient Hospital Stay (HOSPITAL_COMMUNITY): Payer: 59 | Admitting: Physical Therapy

## 2016-07-22 ENCOUNTER — Inpatient Hospital Stay (HOSPITAL_COMMUNITY): Payer: 59 | Admitting: Occupational Therapy

## 2016-07-22 NOTE — Progress Notes (Signed)
Occupational Therapy Session Note  Patient Details  Name: Jason Oconnor MRN: [id number] Date of Birth: August 07, 1956  Today's Date: 07/22/2016 OT Individual Time: 1400-1500 OT Individual Time Calculation (min): 60 min     Short Term Goals:Week 1:  OT Short Term Goal 1 (Week 1): Pt will complete toilet transfer with Min assist with LRAD OT Short Term Goal 2 (Week 1): Pt will complete LB dressing with min assist OT Short Term Goal 3 (Week 1): Pt will complete 2 grooming tasks in standing with min assist for standing balance OT Short Term Goal 4 (Week 1): Pt will utilize LUE as gross assist with min cues during functional activity  Skilled Therapeutic Interventions/Progress Updates:    Pt seen for OT ADL bathing/dressing session. Pt sitting up in w/c upon arrival with hand off from PT. He voiced desire for showering task. He ambulated with RW and min-mod A to gather clothing items and ambulated into bathroom. He bathed seated on tub bench with steadying assist required for buttock hygiene and for thoroughness of washing R UE.  Pt with 1 LOB episode when standing to attempt to pull up pants. Assist required for controlled descent back to tub bench.  He completed grooming tasks seated in w/c at sink. VCs provided throughout for incorporation of L UE and for better positioning. Pt able to recall hemi-dressing techniques Mod I during UB/LB dressing.  Pt self propelled w/c to therapy day room using LEs with supervision. He completed towel pushes from w/c level in all planes x10 reps to R, L, and center. He then stood for two trials of towel pushes with min A with emphasis on weight shift and shoulder ROM. Pt returned to room at end of session, ambulated to recliner and left with all needs in reach.   Therapy Documentation Precautions:  Precautions Precautions: Fall Precaution Comments: Monitor vitals Restrictions Weight Bearing Restrictions: No Pain: Pain Assessment Pain Assessment: 0-10 Pain  Score: 4  Pain Location: Shoulder (& low back) Pain Orientation: Left Pain Intervention(s): Ambulation/increased activity (premedicated)  See Function Navigator for Current Functional Status.   Therapy/Group: Individual Therapy  Lewis, Ophelia Sipe C 07/22/2016, 2:30 PM

## 2016-07-22 NOTE — Progress Notes (Signed)
Social Work Patient ID: Jason Oconnor, male   DOB: 22-Dec-1955, 60 y.o.   MRN: [id number]   CSW asked PA, Marlowe Shores, to complete pt's wife's FMLA paperwork.  CSW faxed completed and signed paperwork to [facility] at [employer] request.  Hanley Seamen originals with fax confirmation sheet to pt.  CSW will continue to follow.

## 2016-07-22 NOTE — Progress Notes (Signed)
Physical Therapy Session Note  Patient Details  Name: Jason Oconnor MRN: [id number] Date of Birth: October 29, 1956  Today's Date: 07/22/2016 PT Individual Time: 1331-1400 PT Individual Time Calculation (min): 29 min    Short Term Goals: Week 1:  PT Short Term Goal 1 (Week 1): Pt will perform bed mobility on flat bed with supervision PT Short Term Goal 2 (Week 1): Pt will perform bed <> chair transfers with LRAD and consistent mod A PT Short Term Goal 3 (Week 1): Pt will perform gait in controlled environment x 100' with LRAD and consistent mod A  PT Short Term Goal 4 (Week 1): Pt will perform stair negotiation up/down 8 stairs with 2 rails with mod A for LE strengthening PT Short Term Goal 5 (Week 1): Pt will perform w/c mobility x 150' with supervision and maintain trunk and hip position in w/c  Skilled Therapeutic Interventions/Progress Updates:    Pt received in recliner & agreeable to PT, noting 4/10 pain in L shoulder & low back but premedicated & denying medication. Session focused on gait training x 150 ft + 150 ft with RW, L AFO, & Min A with therapist providing assistance to LUE to hold RW, cuing for increased L hip & knee flexion and foot clearance, and manual facilitation for increased weight shift to L. Pt performed tall kneeling on mat table in gym to focus on hamstring strength and weight bearing through LUE for support.  Therapist provided tactile & verbal cuing for positioning and equal weightbearing BLE. Pt tolerated lying prone x 1-2 minutes during rest break for extensor stretch. At end of session pt left sitting in w/c in room with all needs within reach.   Therapy Documentation Precautions:  Precautions Precautions: Fall Precaution Comments: Monitor vitals Restrictions Weight Bearing Restrictions: No  Pain: Pain Assessment Pain Assessment: 0-10 Pain Score: 4  Pain Location: Shoulder (& low back) Pain Orientation: Left Pain Intervention(s): Ambulation/increased activity  (premedicated)   See Function Navigator for Current Functional Status.   Therapy/Group: Individual Therapy  Waunita Schooner 07/22/2016, 3:05 PM

## 2016-07-22 NOTE — Progress Notes (Signed)
Physical Therapy Session Note  Patient Details  Name: Jason Oconnor MRN: [id number] Date of Birth: 04-15-1956  Today's Date: 07/22/2016 PT Individual Time: 1047-1202 PT Individual Time Calculation (min): 75 min    Short Term Goals: Week 1:  PT Short Term Goal 1 (Week 1): Pt will perform bed mobility on flat bed with supervision PT Short Term Goal 2 (Week 1): Pt will perform bed <> chair transfers with LRAD and consistent mod A PT Short Term Goal 3 (Week 1): Pt will perform gait in controlled environment x 100' with LRAD and consistent mod A  PT Short Term Goal 4 (Week 1): Pt will perform stair negotiation up/down 8 stairs with 2 rails with mod A for LE strengthening PT Short Term Goal 5 (Week 1): Pt will perform w/c mobility x 150' with supervision and maintain trunk and hip position in w/c  Skilled Therapeutic Interventions/Progress Updates:  Pt received in recliner; pt reporting some L shoulder and neck pain.  L shoulder noted to be anteriorly rotated with very slight subluxation noted when UE unsupported.  Will consider use of Kinesiotape for shoulder support and pain management.  Pt performed gait with RW and hand orthosis x 150' to gym with min-mod A with continued L foot drop and genu recurvatum but improved trunk control and motor control of gait sequence.  In gym performed AFO assessment with pt performing gait x 75' x 2 reps each with L PLS donned and then L pre-tibial AFO with heel wedge.  Pt demonstrated improved foot clearance with PLS but improved knee control with pre-tibial AFO.  Will continue to assess.  PLS donned and pt performed one step negotiation training for home entry/exit with RW with mod A overall and verbal cues for sequence.  Performed NMR; see below for details.  Pt performed gait back to room with RW without hand orthosis to facilitate increased attention to and activation of LUE and with L PLS with min A and therapist providing verbal and visual cues for increased R  weight shifting and increased anterior rotation of L pelvis to bring COG over stance LE to minimize genu recurvatum.  At end of session pt left in w/c with tray and all items within reach.  Therapy Documentation Precautions:  Precautions Precautions: Fall Precaution Comments: Monitor vitals Restrictions Weight Bearing Restrictions: No Pain: Pain Assessment Pain Assessment: No/denies pain Other Treatments: Treatments Neuromuscular Facilitation: Left;Upper Extremity;Lower Extremity;Activity to increase motor control;Activity to increase timing and sequencing;Activity to increase sustained activation;Activity to increase lateral weight shifting;Activity to increase anterior-posterior weight shifting   See Function Navigator for Current Functional Status.   Therapy/Group: Individual Therapy  Raylene Everts [facility] 07/22/2016, 12:38 PM

## 2016-07-22 NOTE — Progress Notes (Signed)
Subjective/Complaints: Left shoulder sore at times. Frustrated that hand and foot are not getting stronger.  Review of systems negative for chest pains, shortness of breath, nausea, vomiting, diarrhea, constipation, urinary or bowel incontinence.  Objective: Vital Signs: Blood pressure 107/75, pulse 71, temperature 98.4 F (36.9 C), temperature source Oral, resp. rate 18, height 6\' 4"  (1.93 m), weight 102.1 kg (225 lb), SpO2 97 %. No results found. No results found for this or any previous visit (from the past 72 hour(s)).   HEENT: normocephalic, atraumatic Cardio: RRR and No murmurs Resp: CTA B/L and Unlabored GI: BS positive and Nontender, nondistended Skin:   Facial eczema.  Neuro: Alert/Oriented, Cranial Nerve Abnormalities Left central 7 Motor LUE: 4/5 deltoid, biceps 4-/5, triceps 4-/5, finger flexion, extension are 1/5.  LLE: Hip flexion 4/5, knee extension, 1+/5 ankle dorsiflexion  Tone:  Within Normal Limits Musc/Skel:  No edema.  No tenderness. Gen. no acute distress. Vital signs reviewed.   Assessment/Plan: 1. 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 Physiatrist is providing close team supervision and 24 hour management of active medical problems listed below. Physiatrist and rehab team continue to assess barriers to discharge/monitor patient progress toward functional and medical goals. FIM: Function - Bathing Position: Shower Body parts bathed by patient: Right arm, Left arm, Chest, Abdomen, Front perineal area, Buttocks, Right upper leg, Left upper leg, Right lower leg, Left lower leg Body parts bathed by helper: Back Bathing not applicable: Right lower leg, Left lower leg Assist Level: Touching or steadying assistance(Pt > 75%)  Function- Upper Body Dressing/Undressing What is the  patient wearing?: Pull over shirt/dress Pull over shirt/dress - Perfomed by patient: Thread/unthread right sleeve, Thread/unthread left sleeve, Put head through opening, Pull shirt over trunk Pull over shirt/dress - Perfomed by helper: Thread/unthread left sleeve Assist Level: Supervision or verbal cues Set up : To obtain clothing/put away Function - Lower Body Dressing/Undressing What is the patient wearing?: Underwear, Pants, Shoes, Ted Hose Position: Wheelchair/chair at [employer] - Performed by patient: Thread/unthread right underwear leg, Thread/unthread left underwear leg, Pull underwear up/down Underwear - Performed by helper: Thread/unthread left underwear leg Pants- Performed by patient: Thread/unthread right pants leg, Thread/unthread left pants leg, Pull pants up/down Pants- Performed by helper: Thread/unthread left pants leg Shoes - Performed by patient: Don/doff right shoe, Don/doff left shoe Shoes - Performed by helper: Don/doff left shoe TED Hose - Performed by helper: Don/doff right TED hose, Don/doff left TED hose Assist for footwear: Partial/moderate assist Assist for lower body dressing: Touching or steadying assistance (Pt > 75%)  Function - Toileting Toileting steps completed by patient: Adjust clothing prior to toileting, Performs perineal hygiene, Adjust clothing after toileting Toileting Assistive Devices: Grab bar or rail Assist level: Touching or steadying assistance (Pt.75%)  Function - Air cabin crew transfer assistive device: Grab bar, Walker Assist level to toilet: Moderate assist (Pt 50 - 74%/lift or lower) Assist level from toilet: Moderate assist (Pt 50 - 74%/lift or lower)  Function - Chair/bed transfer Chair/bed transfer method: Stand pivot Chair/bed transfer assist level: Moderate assist (Pt 50 - 74%/lift or lower) Chair/bed transfer assistive device: Armrests  Function - Locomotion: Wheelchair Type: Manual Max wheelchair distance:  150 Assist Level: Supervision or verbal cues Assist Level: Supervision or verbal cues Assist Level: Supervision or verbal cues Turns around,maneuvers to table,bed, and toilet,negotiates 3% grade,maneuvers on rugs  and over doorsills: No Function - Locomotion: Ambulation Assistive device: Walker-rolling Max distance: 75 Assist level: Touching or steadying assistance (Pt > 75%) Assist level: Touching or steadying assistance (Pt > 75%) Assist level: Touching or steadying assistance (Pt > 75%) Walk 150 feet activity did not occur: Safety/medical concerns Walk 10 feet on uneven surfaces activity did not occur: Safety/medical concerns Assist level: Maximal assist (Pt 25 - 49%)  Function - Comprehension Comprehension: Auditory Comprehension assist level: Follows complex conversation/direction with extra time/assistive device  Function - Expression Expression: Verbal Expression assist level: Expresses complex ideas: With no assist  Function - Social Interaction Social Interaction assist level: Interacts appropriately with others - No medications needed.  Function - Problem Solving Problem solving assist level: Solves complex problems: With extra time  Function - Memory Memory assist level: Complete Independence: No helper Patient normally able to recall (first 3 days only): Current season, Staff names and faces, That he or she is in a hospital, Location of own room  Medical Problem List and Plan: 1.  Left-sided weakness secondary to right posterior basal ganglia infarct on 8/20  Cont CIR  PRAFO qHS  -still frustrated by weakness--provided encouragement today 2.  DVT Prophylaxis/Anticoagulation: Subcutaneous Lovenox. Monitor platelet counts and any signs of bleeding 3. Pain Management: Tylenol as needed  -discussed shoulder/LUE support to avoid shoulder syndrome 4. Hypertension: Improved  Norvasc 2.5 mg daily, increased to 5mg  on 8/26--fair control  Monitor with increased  mobility Vitals:   07/21/16 1452 07/22/16 0522  BP: (!) 143/90 107/75  Pulse: 95 71  Resp: 18 18  Temp: 98.4 F (36.9 C) 98.4 F (36.9 C)   5. Neuropsych: This patient is capable of making decisions on his own behalf. 6. Skin/Wound Care: Routine skin checks upon admit 7. Fluids/Electrolytes/Nutrition: Routine I&O's  8. GERD. Protonix- changed to pepcid  9. Elevated lipids: apparently has an allergy to lipitor--"clots". Will need to review alternative choices/safety of these meds                       -dietary choices and exercise to be reinforced, Total cholesterol 205, LDL 138, may be able to bring this down with diet only heart healthy diet  LOS (Days) 6 A FACE TO FACE EVALUATION WAS PERFORMED   SWARTZ,ZACHARY T 07/22/2016, 9:09 AM

## 2016-07-22 NOTE — Progress Notes (Signed)
Occupational Therapy Session Note  Patient Details  Name: Jason Oconnor MRN: [id number] Date of Birth: 04/10/1956  Today's Date: 07/22/2016 OT Individual Time: [id number] OT Individual Time Calculation (min): 51 min    Skilled Therapeutic Interventions/Progress Updates:   Today's focus:   Patient and wife educated on sensory stimulation techniques for left tips of fingers as patient stated he did not have much sensation there; forearm stretching to help with left supination; left neck and upper back levator and trapezius stretching to ease complaints of fatigue in left neck and shoulder (compensating for left left decreased strength and ROM)  Wife present during session and stated that she wondered "if stroke may have been caused by the medication and long period of time of having his neck bent back during his dentist visit one week before"  Therapy Documentation Precautions:  Precautions Precautions: Fall Precaution Comments: Monitor vitals Restrictions Weight Bearing Restrictions: No Pain:denied    See Function Navigator for Current Functional Status.   Therapy/Group: Individual Therapy  Herschell Dimes 07/22/2016, 7:52 PM

## 2016-07-23 ENCOUNTER — Inpatient Hospital Stay (HOSPITAL_COMMUNITY): Payer: 59 | Admitting: Physical Therapy

## 2016-07-23 ENCOUNTER — Inpatient Hospital Stay (HOSPITAL_COMMUNITY): Payer: 59 | Admitting: Occupational Therapy

## 2016-07-23 LAB — CREATININE, SERUM: Creatinine, Ser: 0.92 mg/dL (ref 0.61–1.24)

## 2016-07-23 NOTE — Progress Notes (Signed)
Physical Therapy Session Note  Patient Details  Name: Jason Oconnor MRN: [id number] Date of Birth: Aug 11, 1956  Today's Date: 07/23/2016 PT Individual Time: 1048-1205 PT Individual Time Calculation (min): 77 min    Short Term Goals: Week 1:  PT Short Term Goal 1 (Week 1): Pt will perform bed mobility on flat bed with supervision PT Short Term Goal 2 (Week 1): Pt will perform bed <> chair transfers with LRAD and consistent mod A PT Short Term Goal 3 (Week 1): Pt will perform gait in controlled environment x 100' with LRAD and consistent mod A  PT Short Term Goal 4 (Week 1): Pt will perform stair negotiation up/down 8 stairs with 2 rails with mod A for LE strengthening PT Short Term Goal 5 (Week 1): Pt will perform w/c mobility x 150' with supervision and maintain trunk and hip position in w/c  Skilled Therapeutic Interventions/Progress Updates:  Pt received in recliner after OT session; pt still reporting intermittent shoulder and neck pain with certain positions and movements.  Pt would still like to try kinesiotape.  Pt performed sit > stand with supervision and ambulated x 150' with RW with min A to maintain L hand position on RW and intermittent tactile cues for L pelvis anterior rotation to decrease L genu recurvatum.  In gym kinesiotape applied to L upper trap to facilitate mm inhibition, L anterior and posterior deltoid for activation and power strip around LUE to position shoulder in ER.  Pt continued NMR and weight shift training as described below.  At end of session pt returned to room ambulating with RW with min A with pt demonstrating improved control of L swing phase, heel strike and forward rotation of L pelvis until pt fatigued and then pt demonstrated increased L foot drag, hip drop and genu recurvatum.  Pt left in recliner with all items within reach.  Therapy Documentation Precautions:  Precautions Precautions: Fall Precaution Comments: Monitor vitals Restrictions Weight  Bearing Restrictions: No Pain: Pain Assessment Pain Assessment: No/denies pain Pain Score: 1  Pain Intervention(s): Repositioned Other Treatments: Treatments Neuromuscular Facilitation: Left;Upper Extremity;Lower Extremity;Activity to increase motor control;Activity to increase timing and sequencing;Activity to increase sustained activation;Activity to increase lateral weight shifting;Activity to increase anterior-posterior weight shifting;Forced use;Activity to increase coordination;Activity to increase grading during stair negotiation with pt stepping up with LLE against resistance of thera-tubing placed at pelvis to facilitate increased forward weight shift, rotation of pelvis and L hip extension x 10 reps with bilat UE support and mod A; use of theraband for isolated L hamstring and hip flexor activation with hamstring curl with hip flexion x 10 reps; gait x 50' x 4 reps with use of walking sticks for increased extension activation and trunk activation on L side and to facilitate increased trunk and pelvis forward rotations with min-mod A; bilat UE and LE strengthening, coordination and endurance on Nustep at level 5 x 8 minutes with pt demonstrating improved hip control and not needing leg guide today.   See Function Navigator for Current Functional Status.   Therapy/Group: Individual Therapy  Raylene Everts [facility] 07/23/2016, 12:17 PM

## 2016-07-23 NOTE — Progress Notes (Signed)
Occupational Therapy Session Note  Patient Details  Name: Jason Oconnor MRN: [id number] Date of Birth: 09-01-1956  Today's Date: 07/23/2016 OT Individual Time: 0900-1000 and [id number] OT Individual Time Calculation (min): 60 min and 46 min    Short Term Goals: Week 1:  OT Short Term Goal 1 (Week 1): Pt will complete toilet transfer with Min assist with LRAD OT Short Term Goal 2 (Week 1): Pt will complete LB dressing with min assist OT Short Term Goal 3 (Week 1): Pt will complete 2 grooming tasks in standing with min assist for standing balance OT Short Term Goal 4 (Week 1): Pt will utilize LUE as gross assist with min cues during functional activity  Skilled Therapeutic Interventions/Progress Updates:    1) Treatment session with focus on LUE NMR and dynamic standing balance.  Pt declined bathing this session, requesting to do it during second session.  Ambulated to sink to complete oral care in standing.  Utilized LUE as gross assist when holding items and then educated on weight bearing when not in use to continue to incorporate into task and encourage motor recovery.  Ambulated to therapy gym with min-mod assist and cues for attention to LUE and LLE during ambulation.  Engaged in weight bearing in sitting with reaching across midline to increase weight bearing through LUE.  Following weight bearing, engaged in reaching activity with focus on motor control with activation of shoulder, elbow, and grasp.  Pt with improved grasp and motor control with moving small animal figurines across midline from Lt to Rt, increased challenge to sustaining grasp while placing items in small container on Rt.  Pt with difficulty with coordinating release of objects.  Engaged in weight shifting in standing to facilitate increased weight shift and weight bearing through LLE and improve midline standing balance and tolerance of weight through LLE.  Utilized mirror to draw attention to weight shifting and midline during  functional reaching and standing balance.  2) Treatment session with focus on functional transfers and ADL retraining.  Pt ambulated into room shower with RW and min assist, tactile cues at hip for increased proprioception and placement.  Bathing completed at overall supervision with exception of min assist when standing to wash buttocks.  Noted improved motor control and coordination when reaching across to wash Rt arm with LUE.  Dressing completed at sit > stand level with assist to cross LLE over Rt knee, improved ability to maintain positioning while completing LB dressing.  Completed towel glides with increased focus on motor control in various directions.  Improved first finger and last finger movement in isolation.  Therapy Documentation Precautions:  Precautions Precautions: Fall Precaution Comments: Monitor vitals Restrictions Weight Bearing Restrictions: No Pain: Pain Assessment Pain Assessment: 0-10 Pain Score: 1  Pain Intervention(s): Repositioned  See Function Navigator for Current Functional Status.   Therapy/Group: Individual Therapy  Simonne Come 07/23/2016, 12:05 PM

## 2016-07-23 NOTE — Progress Notes (Signed)
Subjective/Complaints: Left shoulder/trap area still sore. Otherwise doing fairly well  Review of systems negative for chest pains, shortness of breath, nausea, vomiting, diarrhea, constipation, urinary or bowel incontinence.  Objective: Vital Signs: Blood pressure (!) 101/59, pulse 67, temperature 98 F (36.7 C), temperature source Oral, resp. rate 18, height _0  (1.93 m), weight 102.1 kg (225 lb), SpO2 98 %. No results found. Results for orders placed or performed during the hospital encounter of 07/16/16 (from the past 72 hour(s))  Creatinine, serum     Status: None   Collection Time: 07/23/16  4:56 AM  Result Value Ref Range   Creatinine, Ser 0.92 0.61 - 1.24 mg/dL   GFR calc non Af Amer >60 >60 mL/min   GFR calc Af Amer >60 >60 mL/min    Comment: (NOTE) The eGFR has been calculated using the CKD EPI equation. This calculation has not been validated in all clinical situations. eGFR's persistently <60 mL/min signify possible Chronic Kidney Disease.      HEENT: normocephalic, atraumatic Cardio: RRR and No murmurs Resp: CTA B/L and Unlabored GI: BS positive and Nontender, nondistended Skin:   Facial eczema.  Neuro: Alert/Oriented, Cranial Nerve Abnormalities Left central 7 Motor LUE: 4/5 deltoid, biceps 4-/5, triceps 4-/5, finger flexion, extension are 1/5.  LLE: Hip flexion 4/5, knee extension, 1+/5 ankle dorsiflexion  Tone:  Within Normal Limits Musc/Skel:  No edema.  No tenderness. Gen. no acute distress. Vital signs reviewed.   Assessment/Plan: 1. 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 Physiatrist is providing close team supervision and 24 hour management of active medical problems listed below. Physiatrist and rehab team continue to assess barriers to discharge/monitor patient  progress toward functional and medical goals. FIM: Function - Bathing Position: Shower Body parts bathed by patient: Right arm, Left arm, Chest, Abdomen, Front perineal area, Buttocks, Right upper leg, Left upper leg, Right lower leg, Left lower leg Body parts bathed by helper: Back Bathing not applicable: Right lower leg, Left lower leg Assist Level: Touching or steadying assistance(Pt > 75%)  Function- Upper Body Dressing/Undressing What is the patient wearing?: Pull over shirt/dress Pull over shirt/dress - Perfomed by patient: Thread/unthread right sleeve, Thread/unthread left sleeve, Put head through opening, Pull shirt over trunk Pull over shirt/dress - Perfomed by helper: Thread/unthread left sleeve Assist Level: Supervision or verbal cues Set up : To obtain clothing/put away Function - Lower Body Dressing/Undressing What is the patient wearing?: Underwear, Pants, Shoes, [employer], AFO Position:  (Tub bench) Underwear - Performed by patient: Thread/unthread right underwear leg, Thread/unthread left underwear leg, Pull underwear up/down Underwear - Performed by helper: Thread/unthread left underwear leg Pants- Performed by patient: Thread/unthread right pants leg, Thread/unthread left pants leg, Pull pants up/down Pants- Performed by helper: Thread/unthread left pants leg Shoes - Performed by patient: Don/doff right shoe, Don/doff left shoe Shoes - Performed by helper: Don/doff left shoe AFO - Performed by helper: Don/doff left AFO TED Hose - Performed by helper: Don/doff right TED hose, Don/doff left TED hose Assist for footwear: Supervision/touching assist Assist for lower body dressing: Touching or steadying assistance (Pt > 75%)  Function - Toileting Toileting steps completed by patient: Adjust clothing prior to toileting, Performs perineal hygiene, Adjust clothing after toileting Toileting Assistive Devices: Grab bar or rail Assist level: Touching or steadying assistance  (Pt.75%)  Function - Air cabin crew transfer  assistive device: Grab bar, Walker Assist level to toilet: Moderate assist (Pt 50 - 74%/lift or lower) Assist level from toilet: Moderate assist (Pt 50 - 74%/lift or lower)  Function - Chair/bed transfer Chair/bed transfer method: Stand pivot Chair/bed transfer assist level: Touching or steadying assistance (Pt > 75%) Chair/bed transfer assistive device: Armrests, Walker  Function - Locomotion: Wheelchair Type: Manual Max wheelchair distance: 150 Assist Level: Supervision or verbal cues Assist Level: Supervision or verbal cues Assist Level: Supervision or verbal cues Turns around,maneuvers to table,bed, and toilet,negotiates 3% grade,maneuvers on rugs and over doorsills: No Function - Locomotion: Ambulation Assistive device: Walker-rolling Max distance: 150 ft Assist level: Touching or steadying assistance (Pt > 75%) Assist level: Touching or steadying assistance (Pt > 75%) Assist level: Touching or steadying assistance (Pt > 75%) Walk 150 feet activity did not occur: Safety/medical concerns Assist level: Touching or steadying assistance (Pt > 75%) Walk 10 feet on uneven surfaces activity did not occur: Safety/medical concerns Assist level: Maximal assist (Pt 25 - 49%)  Function - Comprehension Comprehension: Auditory Comprehension assist level: Follows complex conversation/direction with no assist  Function - Expression Expression: Verbal Expression assist level: Expresses complex ideas: With no assist  Function - Social Interaction Social Interaction assist level: Interacts appropriately with others - No medications needed.  Function - Problem Solving Problem solving assist level: Solves complex problems: With extra time  Function - Memory Memory assist level: Complete Independence: No helper Patient normally able to recall (first 3 days only): Current season, Staff names and faces, That he or she is in a hospital,  Location of own room  Medical Problem List and Plan: 1.  Left-sided weakness secondary to right posterior basal ganglia infarct on 8/20  Cont CIR--team conference today  PRAFO qHS  -does see some progress but admits to being impatient 2.  DVT Prophylaxis/Anticoagulation: Subcutaneous Lovenox. Monitor platelet counts and any signs of bleeding 3. Pain Management: Tylenol as needed  -discussed shoulder/LUE support to avoid shoulder syndrom  -k tape per therapy  -ordered kpad also 4. Hypertension: Improved  Norvasc 2.5 mg daily, increased to 30m on 8/26--fair control at present Vitals:   07/22/16 1510 07/23/16 0530  BP: 135/90 (!) 101/59  Pulse: 93 67  Resp: 18 18  Temp: 97.9 F (36.6 C) 98 F (36.7 C)   5. Neuropsych: This patient is capable of making decisions on his own behalf. 6. Skin/Wound Care:  Skin intact 7. Fluids/Electrolytes/Nutrition: Routine I&O's  8. GERD. Protonix- changed to pepcid  9. Elevated lipids: apparently has an allergy to lipitor--"clots"                       -dietary choices and exercise to be reinforced, Total cholesterol 205, LDL 138, may be able to bring this down with diet only heart healthy diet   -can explore pharmaceutical options as outpt based on follow up labs  LOS (Days) 7 A FACE TO FACE EVALUATION WAS PERFORMED   Faustina Gebert T 07/23/2016, 9:37 AM

## 2016-07-24 ENCOUNTER — Inpatient Hospital Stay (HOSPITAL_COMMUNITY): Payer: 59 | Admitting: Occupational Therapy

## 2016-07-24 ENCOUNTER — Inpatient Hospital Stay (HOSPITAL_COMMUNITY): Payer: 59 | Admitting: Physical Therapy

## 2016-07-24 NOTE — Progress Notes (Signed)
07/24/16 1900 nsg RN was called to room to assess lt ankle swelling. Slight swelling no pain, no redness, no warmth noted.Per patient  he has been up on chair most of the day if not in therapy. Patient claims he might not want his prafo tonight. Placed left leg/ foot up on pillows advised patient. to keep it up through the night. Reported to incoming RN.

## 2016-07-24 NOTE — Progress Notes (Signed)
Occupational Therapy Weekly Progress Note  Patient Details  Name: Jason Oconnor MRN: [id number] Date of Birth: 08-23-1956  Beginning of progress report period: July 17, 2016 End of progress report period: July 24, 2016  Today's Date: 07/24/2016 OT Individual Time: 8502-7741 and 2878-6767 OT Individual Time Calculation (min): 74 min and 48 min    Patient has met 4 of 4 short term goals.  Pt is making steady progress towards goals.  Pt currently requires min-mod assist ambulation with RW with tactile cues to improve weight shifting to Lt during swing phase and when standing to complete functional tasks.  Pt demonstrating improved functional use of LUE during familiar tasks, still requiring min cues to incorporate and increase quality of movement.  Continues to have loose gross grasp and intermittent decreased recall of prior education.  Patient continues to demonstrate the following deficits: Lt hemiplegia, decreased attention to LUE and LLE during functional tasks and mobility, impaired sensation in LUE, decreased balance reactions and dynamic standing balance and therefore will continue to benefit from skilled OT intervention to enhance overall performance with BADL and Reduce care partner burden.  Patient progressing toward long term goals..  Continue plan of care.  OT Short Term Goals Week 1:  OT Short Term Goal 1 (Week 1): Pt will complete toilet transfer with Min assist with LRAD OT Short Term Goal 1 - Progress (Week 1): Met OT Short Term Goal 2 (Week 1): Pt will complete LB dressing with min assist OT Short Term Goal 2 - Progress (Week 1): Met OT Short Term Goal 3 (Week 1): Pt will complete 2 grooming tasks in standing with min assist for standing balance OT Short Term Goal 3 - Progress (Week 1): Met OT Short Term Goal 4 (Week 1): Pt will utilize LUE as gross assist with min cues during functional activity OT Short Term Goal 4 - Progress (Week 1): Met Week 2:  OT Short Term Goal 1  (Week 2): Pt will complete toilet transfers with supervision with LRAD OT Short Term Goal 2 (Week 2): Pt will complete LB dressing with supervision and min cues OT Short Term Goal 3 (Week 2): Pt will utilize LUE as gross assist 50% of the time without cues during functional activity OT Short Term Goal 4 (Week 2): Pt will complete simple meal prep with supervision with LRAD   Skilled Therapeutic Interventions/Progress Updates:    1) Treatment session with focus on functional mobility, dynamic standing balance during self-care tasks, and LUE NMR.  Pt declined showering this session.  Donned AFO and shoe in sitting this session with tactile cues to promote increased positioning and stability while donning shoe and AFO.  Ambulated to bathroom with RW and min assist, tactile cues at Lt hip for posture and weight shifting to Lt during swing phase.  Completed toileting with close supervision during standing for clothing management and completed hygiene with lateral leans.  Three grooming tasks completed in standing with alternating supervision - min assist to promote weight shift to Lt in standing.  Reports concerns of mild short term memory deficits.  Engaged in [place] in sitting and standing with focus on quality of movement when reaching across various planes.  Incorporated cognitive challenge with recalling series of numbers or completing simple addition during reaching task to locate specific numbers.  Addressed supination/pronation, with pt continuing to demonstrate difficulty with supination during rotation of cups. Reiterated self-ROM exercises and stretches.  Engaged in reaching activity in standing with focus on motor control  and quality of movement while incorporating familiar cognitive task.  Returned to room ambulating with RW with min assist -supervision, most challenge when navigating turns and obstacles.    2) Treatment session with focus on functional mobility, standing balance, and incorporation  of LUE into self-care tasks.  Ambulated to room shower with RW and min assist, noted improved weight shift during ambulation this session.  Completed bathing at sit > stand level in shower with min cues for weight shifting to achieve equal/midline standing while washing buttocks.  Pt reports mild difficulty with remembering to cross LLE over Rt knee to increase success with threading pants and donning AFO with shoe.  Reiterated the recommendation to "attach" items to familiar, previous information to increase recall as well as writing important things down to ensure recall.  Completed MOCA due to pt's concern regarding mild memory deficits.  Scored 26/30 with pt losing points for delayed recall (remembering 3 of 5 words after approx 5 mins), incorrect date (off by one day), and abstract thinking.    Therapy Documentation Precautions:  Precautions Precautions: Fall Precaution Comments: Monitor vitals Restrictions Weight Bearing Restrictions: No General:   Vital Signs: Therapy Vitals Temp: 98 F (36.7 C) Temp Source: Oral Pulse Rate: 64 Resp: 18 BP: 124/76 Patient Position (if appropriate): Lying Oxygen Therapy SpO2: 98 % O2 Device: Not Delivered Pain:  Pt with intermittent c/o pain in Lt traps/neck, having just received meds.  See Function Navigator for Current Functional Status.   Therapy/Group: Individual Therapy  Simonne Come 07/24/2016, 9:20 AM

## 2016-07-24 NOTE — Progress Notes (Signed)
Orthopedic Tech Progress Note Patient Details:  Jason Oconnor 06/26/1956 [id number]  Patient ID: [name], male   DOB: 02/23/1956, 60 y.o.   MRN: [id number]   Hildred Priest 07/24/2016, 12:58 PM Called in advanced brace order; spoke with [facility]

## 2016-07-24 NOTE — Progress Notes (Signed)
Social Work Patient ID: Jason Oconnor, male   DOB: November 16, 1956, 60 y.o.   MRN: [id number]   CSW met with pt to update him on team conference discussion and tell him of changed d/c date of 08-03-16.  Pt was unsure about moving the date sooner, as he is not sure he will have made all of the progress he wants to see prior to d/c.  CSW tried to explain that it would not be good for pt to stay and wait to be 100% and that he may still changes for months to come.  This made sense to pt.  CSW is working toward getting pt set up for outpt therapies at [facility] in [place].  CSW will continue to work on this.  Pt's wife was at school, so CSW did not bother her.  Pt feels comfortable giving her the report and CSW told him to have her call CSW with any questions.  CSW remains available as needed.

## 2016-07-24 NOTE — Patient Care Conference (Signed)
Inpatient RehabilitationTeam Conference and Plan of Care Update Date: 07/23/2016   Time: 3:00 PM   Patient Name: Jason Oconnor      Medical Record Number: [id number]  Date of Birth: Jul 08, 1956 Sex: Male         Room/Bed: 4W10C/4W10C-01 Payor Info: Payor: Theme park manager / Plan: [employer] OTHER / Product Type: *No Product type* /    Admitting Diagnosis: CVA  Admit Date/Time:  07/16/2016  2:52 PM Admission Comments: No comment available   Primary Diagnosis:  Right basal ganglia embolic stroke ([name]) Principal Problem: Right basal ganglia embolic stroke [facility])  Patient Active Problem List   Diagnosis Date Noted  . Left foot drop   . Benign essential HTN   . Hemiparesis affecting nondominant side as late effect of cerebrovascular accident (CVA) ([name]) 07/18/2016  . Right basal ganglia embolic stroke ([name]) [id number]  . CVA (cerebral infarction) 07/15/2016  . TIA (transient ischemic attack) 07/14/2016  . HTN, goal below 140/80 07/14/2016  . Left facial numbness 07/14/2016  . Weakness of left side of body 07/14/2016    Expected Discharge Date: Expected Discharge Date: 08/03/16  Team Members Present: Physician leading conference: Dr. Alger Simons Social Worker Present: Alfonse Alpers, LCSW Nurse Present: Heather Roberts, RN PT Present: Raylene Everts, PT OT Present: Simonne Come, OT SLP Present: Windell Moulding, SLP PPS Coordinator present : Daiva Nakayama, RN, CRRN     Current Status/Progress Goal Weekly Team Focus  Medical   improving motor skills. bp improved.   cotinue maintain bp. stroke education  orthotics, bp control   Bowel/Bladder   continent of bowel and bladder, last BM on 07/22/16  remain continent while in rehab  toilet the patient with appropriate device   Swallow/Nutrition/ Hydration             ADL's   min-mod assist transfers with RW, min assist bathing and LB dressing, supervision UB dressing  Mod I  ADL retraining, LUE NMR, dynamic standing balance   Mobility   min-mod A  Mod I  L NMR, dynamic standing balance, gait, stair negotiation   Communication             Safety/Cognition/ Behavioral Observations            Pain   Tylenol PRN, and k pad in use  <3  assess and treat the pain while in rehab   Skin   No breakdown noted  no new breakdown while in rehab  Continue monitoring the skin Q shift    Rehab Goals Patient on target to meet rehab goals: Yes Rehab Goals Revised: none *See Care Plan and progress notes for long and short-term goals.  Barriers to Discharge: poor ankle contro, ?mild inattention    Possible Resolutions to Barriers:  orthotics, continued NMR    Discharge Planning/Teaching Needs:  Pt plans to return to his home with his wife who will provide intermittent supervision, as needed.  Pt's wife can come in for family education closer to d/c.   Team Discussion:  Dr. Naaman Plummer stated that pt has a lack of perceived progress and this is causing pt some frustration, but team feels pt is making good progress.  He is walking min assist to supervision with rolling walker.  He has no dorsal flexion return and PT thinks he will need a brace of some kind.  He has some inattention to the left side.  OT reports pt has good carryover.  He is min to mod assist with transfers and goals  are mod I.   Revisions to Treatment Plan:  Pt's d/c date changed from 08-06-16 to 08-03-16.   Continued Need for Acute Rehabilitation Level of Care: The patient requires daily medical management by a physician with specialized training in physical medicine and rehabilitation for the following conditions: Daily direction of a multidisciplinary physical rehabilitation program to ensure safe treatment while eliciting the highest outcome that is of practical value to the patient.: Yes Daily medical management of patient stability for increased activity during participation in an intensive rehabilitation regime.: Yes Daily analysis of laboratory values and/or radiology  reports with any subsequent need for medication adjustment of medical intervention for : Neurological problems;Blood pressure problems  Janal Haak, Silvestre Mesi 07/24/2016, 10:31 AM

## 2016-07-24 NOTE — Plan of Care (Signed)
Problem: RH Wheelchair Mobility Goal: LTG Patient will propel w/c in controlled environment (PT) LTG: Patient will propel wheelchair in controlled environment, # of feet with assist (PT)  Outcome: Not Applicable Date Met: 06/05/18 D/C pt ambulatory

## 2016-07-24 NOTE — Progress Notes (Signed)
Subjective/Complaints: Up with OT in gym. Left shoulder,neck still sore--neck more so today. Not sure k-tape helping much.   Review of systems negative for chest pains, shortness of breath, nausea, vomiting, diarrhea, constipation, urinary or bowel incontinence.  Objective: Vital Signs: Blood pressure 124/76, pulse 64, temperature 98 F (36.7 C), temperature source Oral, resp. rate 18, height _0  (1.93 m), weight 102.1 kg (225 lb), SpO2 98 %. No results found. Results for orders placed or performed during the hospital encounter of 07/16/16 (from the past 72 hour(s))  Creatinine, serum     Status: None   Collection Time: 07/23/16  4:56 AM  Result Value Ref Range   Creatinine, Ser 0.92 0.61 - 1.24 mg/dL   GFR calc non Af Amer >60 >60 mL/min   GFR calc Af Amer >60 >60 mL/min    Comment: (NOTE) The eGFR has been calculated using the CKD EPI equation. This calculation has not been validated in all clinical situations. eGFR's persistently <60 mL/min signify possible Chronic Kidney Disease.      HEENT: normocephalic, atraumatic Cardio: RRR and No murmurs Resp: CTA B/L and Unlabored GI: BS positive and Nontender, nondistended Skin:   Facial eczema.  Neuro: Alert/Oriented, Cranial Nerve Abnormalities Left central 7 Motor LUE: 4/5 deltoid, biceps 4-/5, triceps 4-/5, finger flexion, extension are 1/5.  LLE: Hip flexion 4/5, knee extension, 1+/5 ankle dorsiflexion  Tone:  Within Normal Limits Musc/Skel:  No edema.  Mild tenderness in upper left trap. No obvious shoulder subluxation. PROM normal left shoulder Gen. no acute distress. Vital signs reviewed.   Assessment/Plan: 1. 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 Physiatrist is providing close team supervision and 24 hour management of active medical  problems listed below. Physiatrist and rehab team continue to assess barriers to discharge/monitor patient progress toward functional and medical goals. FIM: Function - Bathing Position: Shower Body parts bathed by patient: Right arm, Left arm, Chest, Abdomen, Front perineal area, Buttocks, Right upper leg, Left upper leg, Right lower leg, Left lower leg Body parts bathed by helper: Back Bathing not applicable: Right lower leg, Left lower leg Assist Level: Touching or steadying assistance(Pt > 75%)  Function- Upper Body Dressing/Undressing What is the patient wearing?: Pull over shirt/dress Pull over shirt/dress - Perfomed by patient: Thread/unthread right sleeve, Thread/unthread left sleeve, Put head through opening, Pull shirt over trunk Pull over shirt/dress - Perfomed by helper: Thread/unthread left sleeve Assist Level: Supervision or verbal cues, Set up Set up : To obtain clothing/put away Function - Lower Body Dressing/Undressing What is the patient wearing?: Underwear, Pants, Non-skid slipper socks, Ted Hose, AFO, Shoes Position: Wheelchair/chair at [employer] - Performed by patient: Thread/unthread right underwear leg, Thread/unthread left underwear leg, Pull underwear up/down Underwear - Performed by helper: Thread/unthread left underwear leg Pants- Performed by patient: Thread/unthread right pants leg, Thread/unthread left pants leg, Pull pants up/down Pants- Performed by helper: Thread/unthread left pants leg Non-skid slipper socks- Performed by patient: Don/doff right sock, Don/doff left sock Shoes - Performed by patient: Don/doff right shoe Shoes - Performed by helper: Don/doff left shoe AFO - Performed by helper: Don/doff left AFO TED Hose - Performed by helper: Don/doff right TED hose, Don/doff left TED hose Assist for footwear: Partial/moderate assist Assist for lower body dressing: Touching or steadying assistance (Pt > 75%)  Function - Toileting Toileting steps  completed by  patient: Adjust clothing prior to toileting, Performs perineal hygiene, Adjust clothing after toileting Toileting Assistive Devices: Grab bar or rail Assist level: Supervision or verbal cues  Function - Air cabin crew transfer assistive device: Grab bar, Walker Assist level to toilet: Touching or steadying assistance (Pt > 75%) Assist level from toilet: Touching or steadying assistance (Pt > 75%)  Function - Chair/bed transfer Chair/bed transfer method: Stand pivot, Ambulatory Chair/bed transfer assist level: Touching or steadying assistance (Pt > 75%) Chair/bed transfer assistive device: Armrests, Walker  Function - Locomotion: Wheelchair Type: Manual Max wheelchair distance: 150 Assist Level: Supervision or verbal cues Assist Level: Supervision or verbal cues Assist Level: Supervision or verbal cues Turns around,maneuvers to table,bed, and toilet,negotiates 3% grade,maneuvers on rugs and over doorsills: No Function - Locomotion: Ambulation Assistive device: Walker-rolling Max distance: 150 ft Assist level: Touching or steadying assistance (Pt > 75%) Assist level: Touching or steadying assistance (Pt > 75%) Assist level: Touching or steadying assistance (Pt > 75%) Walk 150 feet activity did not occur: Safety/medical concerns Assist level: Touching or steadying assistance (Pt > 75%) Walk 10 feet on uneven surfaces activity did not occur: Safety/medical concerns Assist level: Maximal assist (Pt 25 - 49%)  Function - Comprehension Comprehension: Auditory Comprehension assist level: Follows complex conversation/direction with no assist  Function - Expression Expression: Verbal Expression assist level: Expresses complex ideas: With no assist  Function - Social Interaction Social Interaction assist level: Interacts appropriately 90% of the time - Needs monitoring or encouragement for participation or interaction.  Function - Problem Solving Problem solving  assist level: Solves basic 90% of the time/requires cueing < 10% of the time  Function - Memory Memory assist level: Recognizes or recalls 90% of the time/requires cueing < 10% of the time Patient normally able to recall (first 3 days only): Current season, Staff names and faces, That he or she is in a hospital, Location of own room  Medical Problem List and Plan: 1.  Left-sided weakness secondary to right posterior basal ganglia infarct on 8/20  Cont CIR therapies  PRAFO qHS  -ordered AFO for LLE 2.  DVT Prophylaxis/Anticoagulation: Subcutaneous Lovenox. Monitor platelet counts and any signs of bleeding 3. Pain Management/left shoulder girdle/neck pain  -needs to be careful not to "fling" left upper extremity during functional activities. No obvious shoulder sublux  -continue supportive measures for LUE  -k tape per therapy  -continue kpad also 4. Hypertension: Improved  Norvasc 2.5 mg daily, increased to 50m on 8/26--fair control at present Vitals:   07/23/16 1319 07/24/16 0528  BP: 135/89 124/76  Pulse: 76 64  Resp: 18 18  Temp: 98 F (36.7 C) 98 F (36.7 C)   5. Neuropsych: This patient is capable of making decisions on his own behalf. 6. Skin/Wound Care:  Skin intact 7. Fluids/Electrolytes/Nutrition: Routine I&O's  8. GERD. Protonix- changed to pepcid  9. Elevated lipids: apparently has an allergy to lipitor--"clots"                       -dietary choices and exercise to be reinforced, Total cholesterol 205, LDL 138, may be able to bring this down with diet only heart healthy diet   -can explore pharmaceutical options as outpt based on follow up labs  LOS (Days) 8 A FACE TO FACE EVALUATION WAS PERFORMED   Jivan Symanski T 07/24/2016, 12:50 PM

## 2016-07-24 NOTE — Progress Notes (Signed)
Physical Therapy Weekly Progress Note  Patient Details  Name: Jason Oconnor MRN: [id number] Date of Birth: 08-05-1956  Beginning of progress report period: July 17, 2016 End of progress report period: July 24, 2016  Patient has made excellent progress and has met 4 of 5 short term goals.  Pt is currently supervision for bed mobility, basic transfers and min A overall for gait with RW, dynamic standing balance and stair negotiation.  Pt demonstrates good motor return in LUE and LLE except for L ankle DF remains 0/5; pt will likely benefit from AFO assessment next week prior to D/C home.    Patient continues to demonstrate the following deficits: L hemiplegia and decreased awareness of L body, impaired motor planning/timing and sequencing, postural control, balance, gait, pain and therefore will continue to benefit from skilled PT intervention to enhance overall performance with balance, postural control, ability to compensate for deficits, functional use of  left upper extremity and left lower extremity, attention and awareness.  Patient progressing toward long term goals..  Plan of care revisions: Due to patient's fast progress pt's D/C date moved up to 08/03/16.  Due to pt now ambulatory on unit, long term w/c goal D/C.Marland Kitchen  PT Short Term Goals Week 1:  PT Short Term Goal 1 (Week 1): Pt will perform bed mobility on flat bed with supervision PT Short Term Goal 1 - Progress (Week 1): Met PT Short Term Goal 2 (Week 1): Pt will perform bed <> chair transfers with LRAD and consistent mod A PT Short Term Goal 2 - Progress (Week 1): Met PT Short Term Goal 3 (Week 1): Pt will perform gait in controlled environment x 100' with LRAD and consistent mod A  PT Short Term Goal 3 - Progress (Week 1): Met PT Short Term Goal 4 (Week 1): Pt will perform stair negotiation up/down 8 stairs with 2 rails with mod A for LE strengthening PT Short Term Goal 4 - Progress (Week 1): Met PT Short Term Goal 5 (Week 1): Pt  will perform w/c mobility x 150' with supervision and maintain trunk and hip position in w/c PT Short Term Goal 5 - Progress (Week 1): Discontinued (comment) (pt ambulatory on unit) Week 2:  PT Short Term Goal 1 (Week 2): = LTG of Mod I overall for D/C 08/03/16   Skilled Therapeutic Interventions/Progress Updates: Ambulation/gait training;Balance/vestibular training;Community reintegration;Discharge planning;Disease management/prevention;DME/adaptive equipment instruction;Functional electrical stimulation;Functional mobility training;Neuromuscular re-education;Patient/family education;Splinting/orthotics;Stair training;Therapeutic Activities;Therapeutic Exercise;UE/LE Strength taining/ROM;UE/LE Coordination activities;Wheelchair propulsion/positioning;Visual/perceptual remediation/compensation   Therapy Documentation Precautions:  Precautions Precautions: Fall Precaution Comments: Monitor vitals Restrictions Weight Bearing Restrictions: No Vital Signs: Therapy Vitals Temp: 98 F (36.7 C) Temp Source: Oral Pulse Rate: 74 Resp: 18 BP: 139/84 Patient Position (if appropriate): Sitting Oxygen Therapy SpO2: 99 % O2 Device: Not Delivered   See Function Navigator for Current Functional Status.  Raylene Everts Faucette 07/24/2016, 2:26 PM

## 2016-07-25 ENCOUNTER — Inpatient Hospital Stay (HOSPITAL_COMMUNITY): Payer: 59 | Admitting: Occupational Therapy

## 2016-07-25 ENCOUNTER — Encounter (HOSPITAL_COMMUNITY): Payer: 59

## 2016-07-25 ENCOUNTER — Inpatient Hospital Stay (HOSPITAL_COMMUNITY): Payer: 59

## 2016-07-25 DIAGNOSIS — R419 Unspecified symptoms and signs involving cognitive functions and awareness: Secondary | ICD-10-CM

## 2016-07-25 DIAGNOSIS — F99 Mental disorder, not otherwise specified: Secondary | ICD-10-CM

## 2016-07-25 NOTE — Consult Note (Signed)
[place]   Mr. Jason Oconnor is a 60 year old man, who was seen for an initial psychodiagnostic examination to assess for potential depression, anxiety, or other mental illness in the setting of stroke.    During the session, Mr. Majewski noted that initially following his stroke, he became "choked up" at times and he had one "break down" with his sister, but has felt better since then.  He stated that the most challenging part of recovery for him has been the loss of independence; he stated that he does not want to feel as though he is a burden on others.  Mr. Talford noted disrupted sleep because the bed is uncomfortable, but stated that his appetite has remained strong.  He said that he has some mild concern about the ultimate extent of his physical recovery, how much of this medical bill will be covered by insurance, and what his financial future holds.  Although he admitted to the previous concerns, he stated that he has not been dwelling on them, but has passing thoughts about them from time-to-time.  Overall, he said that he copes by trying to do what he can independently.  To this end, he expressed that it is his preference for nursing staff and therapists to leave his room with the curtain pulled and door closed so that he can use his urinal in private without having to call someone to come close his door for that purpose.  He remains "pretty positive" most times and he commented that it has been helpful to compare his situation to that of other people on the unit in order to realize that there are others who "have it worse."  He reported that he draws inspiration from remembering his nephew's road to recovery after severe TBI.  He has significant social support and stated that he is highly motivated for therapy on the rehab unit.   From a cognitive perspective, Mr. Provost reported that prior to the stroke, he had begun noticing mild  word-finding trouble and mild forgetfulness, which he assumed were likely secondary to normal aging.  Since the stroke, these symptoms have continued and he thought they may be slightly worse.  In addition, he has developed some trouble concentrating and slightly slowed processing speed.     IMPRESSION:  Mr. Stuchell reported very mild mood reactions that are likely normal in the setting of stroke.  There was no evidence of more a pervasive underlying mental health condition.  However, continued follow-up with the neuropsychologist could be requested should his treatment team feel that it would be beneficial in informing care.  Owing to his privacy concerns, a note was hung in his room to inform other care team members about his preferences for having the curtain pulled and door closed when he is in the room alone.  Cognitively, he reported mild changes, some of which seem to have stemmed from his stroke.  He was provided with psychoeducation regarding the expected course of cognitive recovery in stroke and he appeared to understand.  He was encouraged to seek a comprehensive neuropsychological evaluation in 3-6 months if his cognitive symptoms do not abate with time.  Contact information for a neuropsychologist in his area should be provided in his discharge paperwork for that purpose.    DIAGNOSES:   Unspecified neurocognitive disorder CVA  Marlane Hatcher, Psy.D.  Clinical Neuropsychologist

## 2016-07-25 NOTE — Progress Notes (Signed)
Occupational Therapy Session Note  Patient Details  Name: Jason Oconnor MRN: [id number] Date of Birth: [birth year]  Today's Date: 07/25/2016 OT Individual Time: 0700-0745 OT Individual Time Calculation (min): 45 min     Short Term Goals:Week 2:  OT Short Term Goal 1 (Week 2): Pt will complete toilet transfers with supervision with LRAD OT Short Term Goal 2 (Week 2): Pt will complete LB dressing with supervision and min cues OT Short Term Goal 3 (Week 2): Pt will utilize LUE as gross assist 50% of the time without cues during functional activity OT Short Term Goal 4 (Week 2): Pt will complete simple meal prep with supervision with LRAD  Skilled Therapeutic Interventions/Progress Updates:    Treatment session with focus functional use of LUE and LUE NMR.  Pt received in bed finishing breakfast.  Engaged in discussion regarding use of LUE as gross assist during set up of meal and intermittent hand over hand or gross grasp when applicable to continue to integrate use of LUE into functional tasks.  Engaged in [place] in sitting at EOB with focus on motor control and quality of movement with reaching and grasping of items and placing items into Lt field to further facilitate reaching and activation of Lt elbow while incorporating supination and pronation.  Pt reports need to toilet.  Ambulated to toilet with RW and min-mod assist with facilitation at trunk and Lt hip to further promote weight shifting to Lt during stepping and swing phase.  Increased assist with ambulation providing tactile cues for weight shift and knee stability due to not wearing shoes with AFO and heel wedge.  Returned to EOB and completed supination activity with cups.  Noted increase in compensatory movements during supination attempts despite multimodal cues to improve positioning.  Provided pt with small pieces of foam to work on pinch and 3 jaw chuck grasp between sessions with pt demonstrating understanding of each grasp.  Therapy  Documentation Precautions:  Precautions Precautions: Fall Precaution Comments: Monitor vitals Restrictions Weight Bearing Restrictions: No General:   Vital Signs: Therapy Vitals Temp: 98.3 F (36.8 C) Temp Source: Oral Pulse Rate: 63 Resp: 18 BP: 122/78 Patient Position (if appropriate): Lying Oxygen Therapy SpO2: 96 % O2 Device: Not Delivered Pain:  Pt with no c/o pain  See Function Navigator for Current Functional Status.   Therapy/Group: Individual Therapy  Simonne Come 07/25/2016, 7:34 AM

## 2016-07-25 NOTE — Progress Notes (Signed)
Physical Therapy Session Note  Patient Details  Name: Jason Oconnor MRN: [id number] Date of Birth: 1956/02/04  Today's Date: 07/25/2016 PT Individual Time: 1330-1500 PT Individual Time Calculation (min): 90 min    Skilled Therapeutic Interventions/Progress Updates:    Orthotic assessment with Marcello Moores from [employer] for L AFO and use of heel wedge with recommendation to order [employer] in larger size and use of increased heel wedge - multiple trials of gait with RW longer distances with focus on step length, heel strike, and knee control with supervision to steadying assist. Trial with heel wedge in R shoe also due to increased discrepancy in leg length with new height of heel wedge which pt reports being more comfortable.   Gait training with RW and then with [facility] to challenge balance with focus on gait pattern, knee control, heel strike, and step length of distances 50 -180'. Pt required overall supervision to min assist with RW and steadying assist with [facility] with 1 episode of LOB requiring total A to recover due to distraction when conversating with another staff member.   Neuro re-ed to address sit <> stands, forced use of LLE, fine motor and grasp with LUE to manipulate cones, and balance. Pt instructed in sit <> Stands with 4" step under RLE and both hands on L knee during controlled movement repetition. Nustep x 10 min on level 6 with LUE hand splint and BLE for reciprocal movement pattern retraining and functional strengthening.   Educated on goals and progression as well "homework" during down time in room. Pt continues to be very motivated.     Therapy Documentation Precautions:  Precautions Precautions: Fall Precaution Comments: Monitor vitals Restrictions Weight Bearing Restrictions: No   Pain:  Denies pain   See Function Navigator for Current Functional Status.   Therapy/Group: Individual Therapy  Jason Oconnor, PT, DPT  07/25/2016, 3:41 PM

## 2016-07-25 NOTE — Progress Notes (Signed)
Subjective/Complaints: No issues overnight, trying to oppose fingers to thumb. Cannot oppose left thumb to left little finger.  Review of systems negative for chest pains, shortness of breath, nausea, vomiting, diarrhea, constipation, urinary or bowel incontinence.  Objective: Vital Signs: Blood pressure 122/78, pulse 63, temperature 98.3 F (36.8 C), temperature source Oral, resp. rate 18, height _0  (1.93 m), weight 102.1 kg (225 lb), SpO2 96 %. No results found. Results for orders placed or performed during the hospital encounter of 07/16/16 (from the past 72 hour(s))  Creatinine, serum     Status: None   Collection Time: 07/23/16  4:56 AM  Result Value Ref Range   Creatinine, Ser 0.92 0.61 - 1.24 mg/dL   GFR calc non Af Amer >60 >60 mL/min   GFR calc Af Amer >60 >60 mL/min    Comment: (NOTE) The eGFR has been calculated using the CKD EPI equation. This calculation has not been validated in all clinical situations. eGFR's persistently <60 mL/min signify possible Chronic Kidney Disease.      HEENT: normocephalic, atraumatic Cardio: RRR and No murmurs Resp: CTA B/L and Unlabored GI: BS positive and Nontender, nondistended Skin:   Facial eczema.  Neuro: Alert/Oriented, Cranial Nerve Abnormalities Left central 7 Motor LUE: 4/5 deltoid, biceps 4-/5, triceps 4-/5, finger flexion, extension are 2-/5.  LLE: Hip flexion 4/5, knee extension, 1+/5 ankle dorsiflexion  Tone:  Within Normal Limits Musc/Skel:  No edema.  Mild tenderness in upper left trap. No obvious shoulder subluxation. PROM normal left shoulder Gen. no acute distress. Vital signs reviewed.   Assessment/Plan: 1. 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 Physiatrist is providing close team supervision and 24 hour management of active  medical problems listed below. Physiatrist and rehab team continue to assess barriers to discharge/monitor patient progress toward functional and medical goals. FIM: Function - Bathing Position: Shower Body parts bathed by patient: Right arm, Left arm, Chest, Abdomen, Front perineal area, Buttocks, Right upper leg, Left upper leg, Right lower leg, Left lower leg Body parts bathed by helper: Back Bathing not applicable: Right lower leg, Left lower leg Assist Level: Touching or steadying assistance(Pt > 75%)  Function- Upper Body Dressing/Undressing What is the patient wearing?: Pull over shirt/dress Pull over shirt/dress - Perfomed by patient: Thread/unthread right sleeve, Thread/unthread left sleeve, Put head through opening, Pull shirt over trunk Pull over shirt/dress - Perfomed by helper: Thread/unthread left sleeve Assist Level: Supervision or verbal cues, Set up Set up : To obtain clothing/put away Function - Lower Body Dressing/Undressing What is the patient wearing?: Underwear, Pants, Non-skid slipper socks, Ted Hose, AFO, Shoes Position: Sitting EOB Underwear - Performed by patient: Thread/unthread right underwear leg, Thread/unthread left underwear leg, Pull underwear up/down Underwear - Performed by helper: Thread/unthread left underwear leg Pants- Performed by patient: Thread/unthread right pants leg, Thread/unthread left pants leg, Pull pants up/down Pants- Performed by helper: Thread/unthread left pants leg Non-skid slipper socks- Performed by patient: Don/doff right sock, Don/doff left sock Shoes - Performed by patient: Don/doff right shoe Shoes - Performed by helper: Don/doff left shoe AFO - Performed by patient: Don/doff left AFO AFO - Performed by helper: Don/doff left AFO TED Hose - Performed by helper: Don/doff right TED hose, Don/doff left TED hose Assist for footwear: Partial/moderate assist Assist for lower body dressing: Touching or steadying assistance (Pt >  75%)  Function -  Toileting Toileting steps completed by patient: Adjust clothing prior to toileting, Performs perineal hygiene, Adjust clothing after toileting Toileting Assistive Devices: Grab bar or rail Assist level: Supervision or verbal cues  Function - Air cabin crew transfer assistive device: Grab bar, Walker Assist level to toilet: Touching or steadying assistance (Pt > 75%) Assist level from toilet: Touching or steadying assistance (Pt > 75%)  Function - Chair/bed transfer Chair/bed transfer method: Stand pivot, Ambulatory Chair/bed transfer assist level: Touching or steadying assistance (Pt > 75%) Chair/bed transfer assistive device: Armrests, Walker  Function - Locomotion: Wheelchair Type: Manual Max wheelchair distance: 150 Assist Level: Supervision or verbal cues Assist Level: Supervision or verbal cues Assist Level: Supervision or verbal cues Turns around,maneuvers to table,bed, and toilet,negotiates 3% grade,maneuvers on rugs and over doorsills: No Function - Locomotion: Ambulation Assistive device: Walker-rolling Max distance: 312 ft Assist level: Touching or steadying assistance (Pt > 75%) Assist level: Touching or steadying assistance (Pt > 75%) Assist level: Touching or steadying assistance (Pt > 75%) Walk 150 feet activity did not occur: Safety/medical concerns Assist level: Touching or steadying assistance (Pt > 75%) Walk 10 feet on uneven surfaces activity did not occur: Safety/medical concerns Assist level: Maximal assist (Pt 25 - 49%)  Function - Comprehension Comprehension: Auditory Comprehension assist level: Follows complex conversation/direction with no assist  Function - Expression Expression: Verbal Expression assist level: Expresses complex ideas: With no assist  Function - Social Interaction Social Interaction assist level: Interacts appropriately 90% of the time - Needs monitoring or encouragement for participation or  interaction.  Function - Problem Solving Problem solving assist level: Solves basic 90% of the time/requires cueing < 10% of the time  Function - Memory Memory assist level: Recognizes or recalls 90% of the time/requires cueing < 10% of the time Patient normally able to recall (first 3 days only): Current season, Staff names and faces, That he or she is in a hospital, Location of own room  Medical Problem List and Plan: 1.  Left-sided weakness secondary to right posterior basal ganglia infarct on 8/20  Cont CIR therapies    -Await AFO for LLE, will need training with this 2.  DVT Prophylaxis/Anticoagulation: Subcutaneous Lovenox. Monitor platelet counts and any signs of bleeding 3. Pain Management/left shoulder girdle/neck pain  -No current complaints continue to monitor   -continue supportive measures for LUE  -k tape per therapy  -continue kpad also 4. Hypertension: Improved  Norvasc 2.5 mg daily, increased to 49m on 8/26--fair control at present Vitals:   07/24/16 1310 07/25/16 0458  BP: 139/84 122/78  Pulse: 74 63  Resp: 18 18  Temp: 98 F (36.7 C) 98.3 F (36.8 C)   5. Neuropsych: This patient is capable of making decisions on his own behalf. 6. Skin/Wound Care:  Skin intact 7. Fluids/Electrolytes/Nutrition: Routine I&O's  8. GERD. Protonix- changed to pepcid, No symptoms at present  9. Elevated lipids: apparently has an allergy to lipitor--"clots"                       -dietary choices and exercise to be reinforced, Total cholesterol 205, LDL 138, may be able to bring this down with diet only heart healthy diet   -can explore pharmaceutical options as outpt based on follow up labs  LOS (Days) [id number] 07/25/2016, 9:01 AM

## 2016-07-25 NOTE — Progress Notes (Signed)
Occupational Therapy Session Note  Patient Details  Name: Jason Oconnor MRN: [id number] Date of Birth: 11-26-1955  Today's Date: 07/25/2016 OT Individual Time: 1025-1050 OT Individual Time Calculation (min): 25 min     Short Term Goals:Week 1:  OT Short Term Goal 1 (Week 1): Pt will complete toilet transfer with Min assist with LRAD OT Short Term Goal 1 - Progress (Week 1): Met OT Short Term Goal 2 (Week 1): Pt will complete LB dressing with min assist OT Short Term Goal 2 - Progress (Week 1): Met OT Short Term Goal 3 (Week 1): Pt will complete 2 grooming tasks in standing with min assist for standing balance OT Short Term Goal 3 - Progress (Week 1): Met OT Short Term Goal 4 (Week 1): Pt will utilize LUE as gross assist with min cues during functional activity OT Short Term Goal 4 - Progress (Week 1): Met Week 2:  OT Short Term Goal 1 (Week 2): Pt will complete toilet transfers with supervision with LRAD OT Short Term Goal 2 (Week 2): Pt will complete LB dressing with supervision and min cues OT Short Term Goal 3 (Week 2): Pt will utilize LUE as gross assist 50% of the time without cues during functional activity OT Short Term Goal 4 (Week 2): Pt will complete simple meal prep with supervision with LRAD  Skilled Therapeutic Interventions/Progress Updates:    Pt seen for skilled OT to facilitate  Dynamic balance, LUE functional use with ADL retraining. Pt eager to shower. Ambulated to BR with RW with touching A as he did not have L AFO on. Overall pt only needed close S as he demonstrated good safety awareness with foot stance and he used L hand to wash R arm.  Pt donned shorts in bathroom and then ambulated to chair for shirt. Stood at sink to shave. PT arrived for next session.    Therapy Documentation Precautions:  Precautions Precautions: Fall Precaution Comments: Monitor vitals Restrictions Weight Bearing Restrictions: No   Pain: Pain Assessment Pain Assessment: No/denies  pain ADL:    See Function Navigator for Current Functional Status.   Therapy/Group: Individual Therapy  Rudolph 07/25/2016, 12:29 PM

## 2016-07-25 NOTE — Progress Notes (Signed)
Physical Therapy Session Note  Patient Details  Name: Jason Oconnor MRN: [id number] Date of Birth: 1956/01/28  Today's Date: 07/25/2016 PT Individual Time: 1050-1115 PT Individual Time Calculation (min): 25 min    Short Term Goals: Week 2:  PT Short Term Goal 1 (Week 2): = LTG of Mod I overall for D/C 08/03/16  Skilled Therapeutic Interventions/Progress Updates:   Pt received seated in w/c at sink with handoff from OT after previous session, denies pain and agreeable to treatment. Sit <>stand at sink and shaved face in standing with LUE on sink and S. Pt organized self-care items from sitting in w/c with S and increased time.Therapist donned TEDs totalA; pt donned B shoes and L AFO with S and increased time. Gait 2x175' with RW and S overall; minA required to regain balance one time total after catching L toe on floor. Furniture transfer with S from low couch. Returned to room with gait as above; remained seated in recliner at end of session, all needs in reach.   Therapy Documentation Precautions:  Precautions Precautions: Fall Precaution Comments: Monitor vitals Restrictions Weight Bearing Restrictions: No Pain: Pain Assessment Pain Assessment: No/denies pain   See Function Navigator for Current Functional Status.   Therapy/Group: Individual Therapy  Luberta Mutter 07/25/2016, 11:34 AM

## 2016-07-26 ENCOUNTER — Inpatient Hospital Stay (HOSPITAL_COMMUNITY): Payer: 59 | Admitting: Physical Therapy

## 2016-07-26 ENCOUNTER — Inpatient Hospital Stay (HOSPITAL_COMMUNITY): Payer: 59 | Admitting: Occupational Therapy

## 2016-07-26 NOTE — Progress Notes (Signed)
Occupational Therapy Session Note  Patient Details  Name: Jason Oconnor MRN: [id number] Date of Birth: 1955/12/29  Today's Date: 07/26/2016 OT Individual Time: [id number] and [id number] OT Individual Time Calculation (min): 45 min and 50 min    Short Term Goals: Week 2:  OT Short Term Goal 1 (Week 2): Pt will complete toilet transfers with supervision with LRAD OT Short Term Goal 2 (Week 2): Pt will complete LB dressing with supervision and min cues OT Short Term Goal 3 (Week 2): Pt will utilize LUE as gross assist 50% of the time without cues during functional activity OT Short Term Goal 4 (Week 2): Pt will complete simple meal prep with supervision with LRAD  Skilled Therapeutic Interventions/Progress Updates:    1) Treatment session with focus on functional mobility, dynamic balance, and functional use of LUE during functional tasks.  Pt reports need to complete laundry.  Engaged in dynamic standing balance with stooping to retrieve dirty clothes from bottom drawer and place in bag.  Therapist managed bag during ambulation to laundry room with min guard - supervision with RW and AFO donned.  Engaged in lateral weight shift to place laundry from bag into washing machine to challenge weight shift and maintaining balance during functional reaching.  Ambulated to therapy gym with RW with supervision.  Engaged in [place] with focus on motor control and quality of movement with reaching outside BOS in sitting with focus on proper positioning and decreased compensatory movements to improve control and decrease pain.  Incorporated crossing midline and reaching outside BOS with pt demonstrating increased difficulty when reaching forward.  Fine motor control with opening pill containers and dispensing pills with use of LUE as gross assist.  2) Treatment session with focus on ADL retraining and kinesiotaping for Lt shoulder/neck pain.  Pt ambulated to room shower with RW and AFO/shoe with supervision.   Completed bathing at supervision level when standing to wash buttocks.  LB dressing completed seated at edge of tub bench with setup assist.  Completed grooming tasks in standing with focus on increased standing tolerance and balance as well as functional use of LUE.  Applied kinesiotape to Lt rhomboid to facilitate improved positioning and decrease pain most likely spurred on by compensatory movements.  Also reapplied kinesiotape to Lt upper trap.  Therapy Documentation Precautions:  Precautions Precautions: Fall Precaution Comments: Monitor vitals Restrictions Weight Bearing Restrictions: (P) No Pain: Pain Assessment Pain Assessment: 0-10 Pain Score: 3  Pain Type: Acute pain Pain Location: Back Pain Orientation: Lower Pain Descriptors / Indicators: Aching Pain Frequency: Intermittent Pain Onset: With Activity Patients Stated Pain Goal: 3  See Function Navigator for Current Functional Status.   Therapy/Group: Individual Therapy  Simonne Come 07/26/2016, 10:05 AM

## 2016-07-26 NOTE — Progress Notes (Signed)
Physical Therapy Session Note  Patient Details  Name: Daryus Lapan MRN: [id number] Date of Birth: 16-Jan-1956  Today's Date: 07/26/2016 PT Individual Time: 1024-1106 PT Individual Time Calculation (min): 42 min    Short Term Goals: Week 2:  PT Short Term Goal 1 (Week 2): = LTG of Mod I overall for D/C 08/03/16  Skilled Therapeutic Interventions/Progress Updates:  Pt received in recliner folding laundry.  Performed sit > stand and ambulated to bathroom with RW and orthosis with supervision and supervision for all tolieting tasks.  Pt ambulated to gym with RW and supervision with decreased incidence of genu recurvatum and decreased tactile cues need to for pelvic anterior rotation on L.  Continued gait training with quad cane with gait over level surface, carpet and up/down one step/curb with verbal cues to sequence and min A overall but intermittent mod A for balance due to L foot drag and forwards LOB; will continue to address.  Performed transition mat <> floor with mod A; pt performed NMR in half kneeling on floor with R and then LLE forwards while performing dynamic UE activities or with no UE support but min A for increased proximal hip stability and hamstring training.  Returned to room with RW and to recliner with supervision.  Therapy Documentation Precautions:  Precautions Precautions: Fall Precaution Comments: Monitor vitals Restrictions Weight Bearing Restrictions: (P) No Vital Signs: Therapy Vitals Temp: 97.7 F (36.5 C) Temp Source: Oral Pulse Rate: 74 Resp: 18 BP: (!) 143/92 Patient Position (if appropriate): Sitting Oxygen Therapy SpO2: 99 % O2 Device: Not Delivered    See Function Navigator for Current Functional Status.   Therapy/Group: Individual Therapy  Raylene Everts Faucette 07/26/2016, 5:01 PM

## 2016-07-26 NOTE — Progress Notes (Signed)
Occupational Therapy Session Note  Patient Details  Name: Jason Oconnor MRN: [id number] Date of Birth: 1955/12/19  Today's Date: 07/26/2016 OT Individual Time: 7921-7837 OT Individual Time Calculation (min): 32 min     Short Term Goals: Week 1:  OT Short Term Goal 1 (Week 1): Pt will complete toilet transfer with Min assist with LRAD OT Short Term Goal 1 - Progress (Week 1): Met OT Short Term Goal 2 (Week 1): Pt will complete LB dressing with min assist OT Short Term Goal 2 - Progress (Week 1): Met OT Short Term Goal 3 (Week 1): Pt will complete 2 grooming tasks in standing with min assist for standing balance OT Short Term Goal 3 - Progress (Week 1): Met OT Short Term Goal 4 (Week 1): Pt will utilize LUE as gross assist with min cues during functional activity OT Short Term Goal 4 - Progress (Week 1): Met Week 2:  OT Short Term Goal 1 (Week 2): Pt will complete toilet transfers with supervision with LRAD OT Short Term Goal 2 (Week 2): Pt will complete LB dressing with supervision and min cues OT Short Term Goal 3 (Week 2): Pt will utilize LUE as gross assist 50% of the time without cues during functional activity OT Short Term Goal 4 (Week 2): Pt will complete simple meal prep with supervision with LRAD  Skilled Therapeutic Interventions/Progress Updates:   Pt participated in skilled OT session focusing on L NMR, activity tolerance, and standing endurance. Pt ambulated to gym with L AFO, WBQC and Min A-close supervision with w/c follow behind. 1 small LOB with pt correction. Once in gym, clothespins activity trialed in standing with pt reporting that the plastic was too slick to hold. Therefore tx transitioned to cone stacking.  Pt participated in graded L UE NMR of cone stacking while standing with pt extending digits between each cone to improve extension. Visual biofeedback provided to decrease and raise awareness of shoulder hiking compensatory strategies. Pt ambulated back to room and was  left in recliner with all needs within reach. No c/o pain.   Therapy Documentation Precautions:  Precautions Precautions: Fall Precaution Comments: Monitor vitals Restrictions Weight Bearing Restrictions: (P) No General:   Vital Signs: Therapy Vitals Temp: 97.7 F (36.5 C) Temp Source: Oral Pulse Rate: 74 Resp: 18 BP: (!) 143/92 Patient Position (if appropriate): Sitting Oxygen Therapy SpO2: 99 % O2 Device: Not Delivered :    See Function Navigator for Current Functional Status.   Therapy/Group: Individual Therapy  Hakan Nudelman A Hughes Wyndham 07/26/2016, 4:23 PM

## 2016-07-26 NOTE — Progress Notes (Signed)
Subjective/Complaints: No issues overnite  Review of systems negative for chest pains, shortness of breath, nausea, vomiting, diarrhea, constipation, urinary or bowel incontinence.  Objective: Vital Signs: Blood pressure 122/73, pulse 61, temperature 97.8 F (36.6 C), temperature source Oral, resp. rate 16, height 6\' 4"  (1.93 m), weight 102.1 kg (225 lb), SpO2 96 %. No results found. No results found for this or any previous visit (from the past 72 hour(s)).   HEENT: normocephalic, atraumatic Cardio: RRR and No murmurs Resp: CTA B/L and Unlabored GI: BS positive and Nontender, nondistended Skin:   Facial eczema.  Neuro: Alert/Oriented, Cranial Nerve Abnormalities Left central 7 Motor LUE: 4/5 deltoid, biceps 4-/5, triceps 4-/5, finger flexion, extension are 2-/5.  LLE: Hip flexion 4/5, knee extension, 1+/5 ankle dorsiflexion  Tone:  Within Normal Limits Musc/Skel:  No edema.  Mild tenderness in upper left trap. No obvious shoulder subluxation. PROM normal left shoulder Gen. no acute distress. Vital signs reviewed.   Assessment/Plan: 1. 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 Physiatrist is providing close team supervision and 24 hour management of active medical problems listed below. Physiatrist and rehab team continue to assess barriers to discharge/monitor patient progress toward functional and medical goals. FIM: Function - Bathing Position: Shower Body parts bathed by patient: Right arm, Left arm, Chest, Abdomen, Front perineal area, Buttocks, Right upper leg, Left upper leg, Right lower leg, Left lower leg Body parts bathed by helper: Back Bathing not applicable: Right lower leg, Left lower leg Assist Level: Supervision or verbal cues  Function- Upper Body Dressing/Undressing What is the  patient wearing?: Pull over shirt/dress Pull over shirt/dress - Perfomed by patient: Thread/unthread right sleeve, Thread/unthread left sleeve, Put head through opening, Pull shirt over trunk Pull over shirt/dress - Perfomed by helper: Thread/unthread left sleeve Assist Level: Supervision or verbal cues, Set up Set up : To obtain clothing/put away Function - Lower Body Dressing/Undressing What is the patient wearing?: Underwear, Pants Position: Other (comment) (shower bench) Underwear - Performed by patient: Thread/unthread right underwear leg, Thread/unthread left underwear leg, Pull underwear up/down Underwear - Performed by helper: Thread/unthread left underwear leg Pants- Performed by patient: Thread/unthread right pants leg, Thread/unthread left pants leg, Pull pants up/down Pants- Performed by helper: Thread/unthread left pants leg Non-skid slipper socks- Performed by patient: Don/doff right sock, Don/doff left sock Shoes - Performed by patient: Don/doff right shoe Shoes - Performed by helper: Don/doff left shoe AFO - Performed by patient: Don/doff left AFO AFO - Performed by helper: Don/doff left AFO TED Hose - Performed by helper: Don/doff right TED hose, Don/doff left TED hose Assist for footwear: Partial/moderate assist Assist for lower body dressing: Touching or steadying assistance (Pt > 75%)  Function - Toileting Toileting steps completed by patient: Adjust clothing prior to toileting, Performs perineal hygiene, Adjust clothing after toileting Toileting Assistive Devices: Grab bar or rail Assist level: Supervision or verbal cues  Function - Air cabin crew transfer assistive device: Landscape architect, Walker Assist level to toilet: Touching or steadying assistance (Pt > 75%) Assist level from toilet: Touching or steadying assistance (Pt > 75%)  Function - Chair/bed transfer Chair/bed transfer method: Stand pivot, Ambulatory Chair/bed transfer assist level: Touching or  steadying assistance (Pt > 75%) Chair/bed transfer assistive device: Armrests, Walker  Function - Locomotion: Wheelchair Will patient use wheelchair at discharge?: No Type: Manual Max wheelchair distance:  150 Assist Level: Supervision or verbal cues Assist Level: Supervision or verbal cues Assist Level: Supervision or verbal cues Turns around,maneuvers to table,bed, and toilet,negotiates 3% grade,maneuvers on rugs and over doorsills: No Function - Locomotion: Ambulation Assistive device: Walker-rolling Max distance: 175 Assist level: Supervision or verbal cues Assist level: Supervision or verbal cues Assist level: Touching or steadying assistance (Pt > 75%) Walk 150 feet activity did not occur: Safety/medical concerns Assist level: Touching or steadying assistance (Pt > 75%) Walk 10 feet on uneven surfaces activity did not occur: Safety/medical concerns Assist level: Maximal assist (Pt 25 - 49%)  Function - Comprehension Comprehension: Auditory Comprehension assist level: Follows complex conversation/direction with no assist  Function - Expression Expression: Verbal Expression assist level: Expresses complex ideas: With no assist  Function - Social Interaction Social Interaction assist level: Interacts appropriately 90% of the time - Needs monitoring or encouragement for participation or interaction.  Function - Problem Solving Problem solving assist level: Solves basic 90% of the time/requires cueing < 10% of the time  Function - Memory Memory assist level: Recognizes or recalls 90% of the time/requires cueing < 10% of the time Patient normally able to recall (first 3 days only): Current season, Staff names and faces, That he or she is in a hospital, Location of own room  Medical Problem List and Plan: 1.  Left-sided weakness secondary to right posterior basal ganglia infarct on 8/20  Cont CIR PT, OT    -Await AFO for LLE, will need training with this 2.  DVT  Prophylaxis/Anticoagulation: Subcutaneous Lovenox. Monitor platelet counts and any signs of bleeding 3. Pain Management/left shoulder girdle/neck pain  -No current complaints continue to monitor   -continue supportive measures for LUE  -k tape per therapy  -continue kpad also 4. Hypertension: Improved  Norvasc 2.5 mg daily, increased to 5mg  on 8/26-Good control at present Vitals:   07/25/16 1548 07/26/16 0500  BP: (!) 141/86 122/73  Pulse: 81 61  Resp: 18 16  Temp: 99 F (37.2 C) 97.8 F (36.6 C)   5. Neuropsych: This patient is capable of making decisions on his own behalf. 6. Skin/Wound Care:  Skin intact 7. Fluids/Electrolytes/Nutrition: Routine I&O's  8. GERD. Protonix- changed to pepcid, No symptoms at present  9. Elevated lipids: apparently has an allergy to lipitor--"clots"                       -dietary choices and exercise to be reinforced, Total cholesterol 205, LDL 138, may be able to bring this down with diet only heart healthy diet, consider red yeast rice   -can explore pharmaceutical options as outpt based on follow up labs  LOS (Days) 10 A FACE TO FACE EVALUATION WAS PERFORMED   Marieclaire Bettenhausen E 07/26/2016, 8:08 AM

## 2016-07-27 ENCOUNTER — Inpatient Hospital Stay (HOSPITAL_COMMUNITY): Payer: 59 | Admitting: Occupational Therapy

## 2016-07-27 NOTE — Progress Notes (Addendum)
Occupational Therapy Session Note  Patient Details  Name: Jason Oconnor MRN: [id number] Date of Birth: 1956/04/22  Today's Date: 07/27/2016 OT Individual Time:  - 1001-1050 Individual Treatment Time Calculation: 49 minutes       Short Term Goals: Week 1:  OT Short Term Goal 1 (Week 1): Pt will complete toilet transfer with Min assist with LRAD OT Short Term Goal 1 - Progress (Week 1): Met OT Short Term Goal 2 (Week 1): Pt will complete LB dressing with min assist OT Short Term Goal 2 - Progress (Week 1): Met OT Short Term Goal 3 (Week 1): Pt will complete 2 grooming tasks in standing with min assist for standing balance OT Short Term Goal 3 - Progress (Week 1): Met OT Short Term Goal 4 (Week 1): Pt will utilize LUE as gross assist with min cues during functional activity OT Short Term Goal 4 - Progress (Week 1): Met Week 2:  OT Short Term Goal 1 (Week 2): Pt will complete toilet transfers with supervision with LRAD OT Short Term Goal 2 (Week 2): Pt will complete LB dressing with supervision and min cues OT Short Term Goal 3 (Week 2): Pt will utilize LUE as gross assist 50% of the time without cues during functional activity OT Short Term Goal 4 (Week 2): Pt will complete simple meal prep with supervision with LRAD    Skilled Therapeutic Interventions/Progress Updates:   Pt participated in skilled OT tx focusing on L NMR, safety awareness, and standing balance during self care completion. Pt gathered ADL items from drawers and RW. ADLs completed shower level with pt ambulating with RW with supervision to shower bench. Bathing completed with pt using L UE as gross assist with cues to WB when standing. Afterwards pt completed dressing with cues to integrate L UE into tasks. With AFO, pt ambulated to sink with LBQC. No LOB. Pt completed oral care while integrating L UE with cues. Grooming/shaving tasks completed w/c level at sink with setup. At end of session, pt was left in w/c with wife present  and all needs within reach. Pt reports that extending fingers is still challenging and tries to extend them at night but in the morning sees that they are curled. Pt may benefit from assessment of hand extensor splint for night use.   Therapy Documentation Precautions:  Precautions Precautions: Fall Precaution Comments: Monitor vitals Restrictions Weight Bearing Restrictions: No   Pain: No c/o pain during session    ADL:      See Function Navigator for Current Functional Status.   Therapy/Group: Individual Therapy  [name] [name] 07/27/2016, 5:26 PM

## 2016-07-27 NOTE — Progress Notes (Signed)
Physical Therapy Session Note  Patient Details  Name: Jason Oconnor MRN: [id number] Date of Birth: [birth year]  Today's Date: 07/26/2016 PT Individual Time: 6606-3016 Calculated PT individual time: 30 min       Short Term Goals: Week 1:  PT Short Term Goal 1 (Week 1): Pt will perform bed mobility on flat bed with supervision PT Short Term Goal 1 - Progress (Week 1): Met PT Short Term Goal 2 (Week 1): Pt will perform bed <> chair transfers with LRAD and consistent mod A PT Short Term Goal 2 - Progress (Week 1): Met PT Short Term Goal 3 (Week 1): Pt will perform gait in controlled environment x 100' with LRAD and consistent mod A  PT Short Term Goal 3 - Progress (Week 1): Met PT Short Term Goal 4 (Week 1): Pt will perform stair negotiation up/down 8 stairs with 2 rails with mod A for LE strengthening PT Short Term Goal 4 - Progress (Week 1): Met PT Short Term Goal 5 (Week 1): Pt will perform w/c mobility x 150' with supervision and maintain trunk and hip position in w/c PT Short Term Goal 5 - Progress (Week 1): Discontinued (comment) (pt ambulatory on unit) Week 2:  PT Short Term Goal 1 (Week 2): = LTG of Mod I overall for D/C 08/03/16  Skilled Therapeutic Interventions/Progress Updates:     Patient received sitting in [name] and agreeable to PT. PT instructed patient in gait training with RW for 164f x 2 with RW and supervision Assist with min cues for improved hip flexion for increased foot clearance and increased heel contact. Patient note to only have 4 instances of L knee genu recurvatum throughout gait with RW. PT also [facility] 1034[facility] 2 with forced use of LUE. [facility] first [facility] cup level with floor for simulated functional in home task. On second bout patient required to intermittently read time on watch while maintaining forward progression. Patient able to maintain forward progression 50% of  the time and noted to have 2 near LOB with min A from PT to correct.   [facility] returned to room and [facility].   Therapy Documentation Precautions:  Precautions Precautions: Fall Precaution Comments: Monitor vitals Restrictions Weight Bearing Restrictions: No General:   Vital Signs: Therapy Vitals Temp: 98.1 F (36.7 C) Temp Source: Oral Pulse Rate: 70 Resp: 18 BP: 124/79 Patient Position (if appropriate): Lying Oxygen Therapy SpO2: 99 % O2 Device: Not Delivered Pain: 0/10    See Function Navigator for Current Functional Status.   Therapy/Group: Individual Therapy  AuLorie Phenix/12/2015, 7:24 AM

## 2016-07-27 NOTE — Progress Notes (Signed)
Subjective/Complaints: Left low back a little sore. Neck feeling better. Happy that grip is improving in his hand.   Review of systems negative for chest pains, shortness of breath, nausea, vomiting, diarrhea, constipation, urinary or bowel incontinence.  Objective: Vital Signs: Blood pressure 124/79, pulse 70, temperature 98.1 F (36.7 C), temperature source Oral, resp. rate 18, height 6\' 4"  (1.93 m), weight 102.1 kg (225 lb), SpO2 99 %. No results found. No results found for this or any previous visit (from the past 72 hour(s)).   HEENT: normocephalic, atraumatic Cardio: RRR and No murmurs Resp: CTA B/L and Unlabored GI: BS positive and Nontender, nondistended Skin:   Facial eczema.  Neuro: Alert/Oriented, Cranial Nerve Abnormalities Left central 7 Motor LUE: 4/5 deltoid, biceps 4-/5, triceps 4-/5, finger flexion, extension are 3+/5.  LLE: Hip flexion 4/5, knee extension, 1+/5 ankle dorsiflexion  Tone:  Within Normal Limits Musc/Skel:  No edema.  Mild tenderness in upper left trap. No obvious shoulder subluxation. PROM normal left shoulder Gen. no acute distress. Vital signs reviewed.   Assessment/Plan: 1. 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 Physiatrist is providing close team supervision and 24 hour management of active medical problems listed below. Physiatrist and rehab team continue to assess barriers to discharge/monitor patient progress toward functional and medical goals. FIM: Function - Bathing Position: Shower Body parts bathed by patient: Right arm, Left arm, Chest, Abdomen, Front perineal area, Buttocks, Right upper leg, Left upper leg, Right lower leg, Left lower leg Body parts bathed by helper: Back Bathing not applicable: Right lower leg, Left lower leg Assist Level:  Supervision or verbal cues  Function- Upper Body Dressing/Undressing What is the patient wearing?: Pull over shirt/dress Pull over shirt/dress - Perfomed by patient: Thread/unthread right sleeve, Thread/unthread left sleeve, Put head through opening, Pull shirt over trunk Pull over shirt/dress - Perfomed by helper: Thread/unthread left sleeve Assist Level: Set up Set up : To obtain clothing/put away Function - Lower Body Dressing/Undressing What is the patient wearing?: Underwear, Pants, Shoes, Ted Hose Position: Other (comment) (shower bench) Underwear - Performed by patient: Thread/unthread right underwear leg, Thread/unthread left underwear leg, Pull underwear up/down Underwear - Performed by helper: Thread/unthread left underwear leg Pants- Performed by patient: Thread/unthread right pants leg, Thread/unthread left pants leg, Pull pants up/down Pants- Performed by helper: Thread/unthread left pants leg Non-skid slipper socks- Performed by patient: Don/doff right sock, Don/doff left sock Shoes - Performed by patient: Don/doff right shoe, Don/doff left shoe Shoes - Performed by helper: Don/doff left shoe AFO - Performed by patient: Don/doff right AFO AFO - Performed by helper: Don/doff left AFO TED Hose - Performed by helper: Don/doff right TED hose, Don/doff left TED hose Assist for footwear: Partial/moderate assist Assist for lower body dressing: Supervision or verbal cues  Function - Toileting Toileting steps completed by patient: Adjust clothing prior to toileting, Performs perineal hygiene, Adjust clothing after toileting Toileting Assistive Devices: Grab bar or rail Assist level: Supervision or verbal cues  Function - Air cabin crew transfer assistive device: Landscape architect, Walker Assist level to toilet: Touching or steadying assistance (Pt > 75%) Assist level from toilet: Touching or steadying assistance (Pt > 75%)  Function - Chair/bed transfer Chair/bed transfer  method: Stand pivot, Ambulatory Chair/bed transfer assist level: Supervision or verbal cues Chair/bed transfer assistive device: Armrests, Environmental consultant, Orthosis  Function - Locomotion: Wheelchair  Will patient use wheelchair at discharge?: No Type: Manual Max wheelchair distance: 150 Assist Level: Supervision or verbal cues Assist Level: Supervision or verbal cues Assist Level: Supervision or verbal cues Turns around,maneuvers to table,bed, and toilet,negotiates 3% grade,maneuvers on rugs and over doorsills: No Function - Locomotion: Ambulation Assistive device: Walker-rolling, Cane-quad, Orthosis Max distance: 175 Assist level: Supervision or verbal cues Assist level: Supervision or verbal cues Assist level: Supervision or verbal cues Walk 150 feet activity did not occur: Safety/medical concerns Assist level: Supervision or verbal cues Walk 10 feet on uneven surfaces activity did not occur: Safety/medical concerns Assist level: Maximal assist (Pt 25 - 49%)  Function - Comprehension Comprehension: Auditory Comprehension assist level: Follows complex conversation/direction with no assist  Function - Expression Expression: Verbal Expression assist level: Expresses complex ideas: With no assist  Function - Social Interaction Social Interaction assist level: Interacts appropriately 90% of the time - Needs monitoring or encouragement for participation or interaction.  Function - Problem Solving Problem solving assist level: Solves basic 90% of the time/requires cueing < 10% of the time  Function - Memory Memory assist level: Recognizes or recalls 90% of the time/requires cueing < 10% of the time Patient normally able to recall (first 3 days only): Current season, Staff names and faces, That he or she is in a hospital, Location of own room  Medical Problem List and Plan: 1.  Left-sided weakness secondary to right posterior basal ganglia infarct on 8/20  Cont CIR PT, OT  -  AFO for LLE   2.  DVT Prophylaxis/Anticoagulation: Subcutaneous Lovenox. Monitor platelet counts and any signs of bleeding 3. Pain Management/left shoulder girdle/neck pain  -only mild complaints continue to monitor   -continue supportive measures for LUE  -k tape per therapy  -continue kpad also 4. Hypertension: Improved  Norvasc 2.5 mg daily, increased to 5mg  on 8/26-Good control at present Vitals:   07/26/16 1307 07/27/16 0550  BP: (!) 143/92 124/79  Pulse: 74 70  Resp: 18 18  Temp: 97.7 F (36.5 C) 98.1 F (36.7 C)   5. Neuropsych: This patient is capable of making decisions on his own behalf. 6. Skin/Wound Care:  Skin intact 7. Fluids/Electrolytes/Nutrition: Routine I&O's  8. GERD. Protonix- changed to pepcid, No symptoms at present  9. Elevated lipids: apparently has an allergy to lipitor--"clots"                       -dietary choices and exercise to be reinforced, Total cholesterol 205, LDL 138, may be able to bring this down with diet only heart healthy diet, consider red yeast rice   -can explore pharmaceutical options as outpt based on follow up labs  LOS (Days) 11 A FACE TO FACE EVALUATION WAS PERFORMED   Kursten Kruk T 07/27/2016, 9:27 AM

## 2016-07-28 ENCOUNTER — Inpatient Hospital Stay (HOSPITAL_COMMUNITY): Payer: 59

## 2016-07-28 NOTE — Progress Notes (Signed)
Subjective/Complaints: Feels that left hand isn't coming along like it should. Pain better. Sleeping well.    Review of systems negative for chest pains, shortness of breath, nausea, vomiting, diarrhea, constipation, urinary or bowel incontinence.  Objective: Vital Signs: Blood pressure 121/77, pulse 63, temperature 97.4 F (36.3 C), temperature source Oral, resp. rate 18, height 6\' 4"  (1.93 m), weight 102.1 kg (225 lb), SpO2 98 %. No results found. No results found for this or any previous visit (from the past 72 hour(s)).   HEENT: normocephalic, atraumatic Cardio: RRR and No murmurs Resp: CTA B/L and Unlabored GI: BS positive and Nontender, nondistended Skin:   Facial eczema.  Neuro: Alert/Oriented, Cranial Nerve Abnormalities Left central 7 Motor LUE: 4/5 deltoid, biceps 4-/5, triceps 4-/5, finger flexion, extension are 3+/5.  LLE: Hip flexion 4/5, knee extension, 1+/5 ankle dorsiflexion  Tone:  Within Normal Limits Musc/Skel:  No edema.  Mild tenderness in upper left trap. No obvious shoulder subluxation. PROM normal left shoulder Gen. no acute distress. Vital signs reviewed.   Assessment/Plan: 1. 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 Physiatrist is providing close team supervision and 24 hour management of active medical problems listed below. Physiatrist and rehab team continue to assess barriers to discharge/monitor patient progress toward functional and medical goals. FIM: Function - Bathing Position: Shower Body parts bathed by patient: Right arm, Left arm, Chest, Abdomen, Front perineal area, Buttocks, Right upper leg, Left upper leg, Right lower leg, Left lower leg Body parts bathed by helper: Back Bathing not applicable: Back Assist Level: Supervision or verbal cues  Function-  Upper Body Dressing/Undressing What is the patient wearing?: Pull over shirt/dress Pull over shirt/dress - Perfomed by patient: Thread/unthread right sleeve, Thread/unthread left sleeve, Put head through opening, Pull shirt over trunk Pull over shirt/dress - Perfomed by helper: Thread/unthread left sleeve Assist Level: Set up Set up : To obtain clothing/put away Function - Lower Body Dressing/Undressing What is the patient wearing?: Underwear, Pants, Ted Hose, AFO, Shoes Position: Other (comment) (shower bench) Underwear - Performed by patient: Thread/unthread right underwear leg, Thread/unthread left underwear leg, Pull underwear up/down Underwear - Performed by helper: Thread/unthread left underwear leg Pants- Performed by patient: Thread/unthread right pants leg, Thread/unthread left pants leg, Pull pants up/down Pants- Performed by helper: Thread/unthread left pants leg Non-skid slipper socks- Performed by patient: Don/doff right sock, Don/doff left sock Shoes - Performed by patient: Don/doff right shoe, Don/doff left shoe Shoes - Performed by helper: Don/doff left shoe AFO - Performed by patient: Don/doff right AFO AFO - Performed by helper: Don/doff left AFO TED Hose - Performed by helper: Don/doff right TED hose, Don/doff left TED hose Assist for footwear: Partial/moderate assist Assist for lower body dressing: Supervision or verbal cues  Function - Toileting Toileting steps completed by patient: Adjust clothing prior to toileting, Performs perineal hygiene, Adjust clothing after toileting Toileting Assistive Devices: Grab bar or rail Assist level: Supervision or verbal cues  Function - Air cabin crew transfer activity did not occur: N/A Toilet transfer assistive device: Grab bar, Walker Assist level to toilet: Touching or steadying assistance (Pt > 75%) Assist level from toilet: Touching or steadying assistance (Pt > 75%)  Function - Chair/bed transfer Chair/bed  transfer method: Ambulatory Chair/bed transfer assist level: Supervision or verbal cues Chair/bed transfer assistive device: Armrests, Radio producer, Orthosis  Function - Locomotion: Wheelchair Will  patient use wheelchair at discharge?: No Type: Manual Max wheelchair distance: 150 Assist Level: Supervision or verbal cues Assist Level: Supervision or verbal cues Assist Level: Supervision or verbal cues Turns around,maneuvers to table,bed, and toilet,negotiates 3% grade,maneuvers on rugs and over doorsills: No Function - Locomotion: Ambulation Assistive device: Walker-rolling, Cane-quad, Orthosis Max distance: 175 Assist level: Supervision or verbal cues Assist level: Supervision or verbal cues Assist level: Supervision or verbal cues Walk 150 feet activity did not occur: Safety/medical concerns Assist level: Supervision or verbal cues Walk 10 feet on uneven surfaces activity did not occur: Safety/medical concerns Assist level: Maximal assist (Pt 25 - 49%)  Function - Comprehension Comprehension: Auditory Comprehension assist level: Follows complex conversation/direction with no assist  Function - Expression Expression: Verbal Expression assist level: Expresses complex ideas: With no assist  Function - Social Interaction Social Interaction assist level: Interacts appropriately 90% of the time - Needs monitoring or encouragement for participation or interaction.  Function - Problem Solving Problem solving assist level: Solves basic 90% of the time/requires cueing < 10% of the time  Function - Memory Memory assist level: Recognizes or recalls 90% of the time/requires cueing < 10% of the time Patient normally able to recall (first 3 days only): Current season, Staff names and faces, That he or she is in a hospital, Location of own room  Medical Problem List and Plan: 1.  Left-sided weakness secondary to right posterior basal ganglia infarct on 8/20  Cont CIR PT, OT  -  AFO for LLE    -reviewed stretches and ROM exercises for left foot/ankle today 2.  DVT Prophylaxis/Anticoagulation: Subcutaneous Lovenox. Monitor platelet counts and any signs of bleeding 3. Pain Management/left shoulder girdle/neck pain  -only mild complaints continue to monitor   -continue supportive measures for LUE  -k tape per therapy  -continue kpad also 4. Hypertension: Improved  Norvasc 2.5 mg daily, increased to 5mg  on 8/26-Good control at present Vitals:   07/27/16 1750 07/28/16 0517  BP: 130/79 121/77  Pulse: 70 63  Resp: 18 18  Temp:  97.4 F (36.3 C)   5. Neuropsych: This patient is capable of making decisions on his own behalf. 6. Skin/Wound Care:  Skin intact 7. Fluids/Electrolytes/Nutrition: Routine I&O's  8. GERD. Protonix- changed to pepcid,   9. Elevated lipids: apparently has an allergy to lipitor--"clots"                       -dietary choices and exercise to be reinforced, Total cholesterol 205, LDL 138, may be able to bring this down with diet only heart healthy diet, consider red yeast rice   -can explore pharmaceutical options as outpt based on follow up labs  LOS (Days) 12 A FACE TO FACE EVALUATION WAS PERFORMED   Velera Lansdale T 07/28/2016, 8:59 AM

## 2016-07-28 NOTE — Progress Notes (Signed)
Occupational Therapy Session Note  Patient Details  Name: Jason Oconnor MRN: [id number] Date of Birth: Jul 16, 1956  Today's Date: 07/28/2016 OT Individual Time: 1100-1200 OT Individual Time Calculation (min): 60 min    Short Term Goals: Week 2:  OT Short Term Goal 1 (Week 2): Pt will complete toilet transfers with supervision with LRAD OT Short Term Goal 2 (Week 2): Pt will complete LB dressing with supervision and min cues OT Short Term Goal 3 (Week 2): Pt will utilize LUE as gross assist 50% of the time without cues during functional activity OT Short Term Goal 4 (Week 2): Pt will complete simple meal prep with supervision with LRAD  Skilled Therapeutic Interventions/Progress Updates:   ADL-retraining (45 min) at shower level with focus on improved functional use of LUE during BADL, transfer safety, and AE training using LH sponge.  Therapeutic activity (15 min) with focus on improved coordination of LUE, HEP re-ed with weight-bearing through LUE.    Pt received seated in recliner awaiting therapist.   Pt able to gather his clothing, ambulate to bathroom and bathe in shower unassisted with only standby assist for safety.   Pt demos ability to incorporate LUE at diminished level during BADL but reports poor thoroughness washing his back.   OT provided LH sponge with demonstration of technique.    Pt dressed lower body sitting on bench and dressed upper body by sink after grooming; overall standby assist for safety with BADL.      Therapy Documentation Precautions:  Precautions Precautions: Fall Precaution Comments: Monitor vitals Restrictions Weight Bearing Restrictions: No   Pain: No/denies pain    See Function Navigator for Current Functional Status.   Therapy/Group: Individual Therapy  [place] 07/28/2016, 12:45 PM

## 2016-07-29 ENCOUNTER — Inpatient Hospital Stay (HOSPITAL_COMMUNITY): Payer: 59 | Admitting: Physical Therapy

## 2016-07-29 ENCOUNTER — Inpatient Hospital Stay (HOSPITAL_COMMUNITY): Payer: 59 | Admitting: Occupational Therapy

## 2016-07-29 NOTE — Progress Notes (Signed)
Physical Therapy Session Note  Patient Details  Name: Jason Oconnor MRN: [id number] Date of Birth: [birth year]  Today's Date: 07/29/2016 PT Individual Time: [id number] PT Individual Time Calculation (min): 60 min    Short Term Goals: Week 2:  PT Short Term Goal 1 (Week 2): = LTG of Mod I overall for D/C 08/03/16  Skilled Therapeutic Interventions/Progress Updates:   Pt received in recliner requesting to use toilet.  Pt performed sit > stand and ambulated to toilet with orthosis and quad cane and supervision; pt performed toileting tasks with supervision.  When ambulating out of bathroom pt placed quad cane down on an angle due to downward slope of bathroom floor and when he applied pressure to the cane the bottom portion snapped pitching the pt forwards; required therapist assistance to maintain balance.  Upon inspecting cane, welding at bottom of cane feed broke away from top portion of cane.  Obtained a new cane for pt and instructed pt to place cane outside of doorway on flat surface to avoid flexion moment on cane.  Pt participated in gait training outdoors over uneven pavement, brick, uphill and downhill, up and down 8 stairs with rail in LUE and cane in RUE with step to sequencing and transfers sit <> stand from low benches all with supervision-min A and focus on full L foot clearance, trunk/postural control, weight shifting and activation of hamstring in stance to prevent genu recurvatum.  Also adjusted heel wedge in shoes to minimize difference between leg lengths.  Returned to gym for [employer]; see below.  Returned to room and pt reporting need to have BM again; transferred w/c > toilet stand pivot with UE support on grab bar with supervision.  Pt handed off to OT for final session.  Therapy Documentation Precautions:  Precautions Precautions: Fall Precaution Comments: Monitor vitals Restrictions Weight Bearing Restrictions: No Vital Signs: Therapy Vitals Temp: 97.9 F (36.6 C) Temp Source:  Oral Pulse Rate: 71 Resp: 16 BP: 132/83 Patient Position (if appropriate): Sitting Oxygen Therapy SpO2: 98 % O2 Device: Not Delivered Pain:  0/10 Other Treatments: Treatments Neuromuscular Facilitation: Left;Upper Extremity;Lower Extremity;Activity to increase motor control;Activity to increase timing and sequencing;Activity to increase sustained activation;Activity to increase lateral weight shifting;Activity to increase anterior-posterior weight shifting;Forced use;Activity to increase coordination;Activity to increase grading during gait training with focus on activation of L scapula into retraction and downward rotation + attention to and use of LUE holding object during gait with cane.  Continued postural control and UE NMR training seated in w/c during 2 reps zoom ball with focus on activation of shoulder flexion, ABD, ER and scapular retraction minimizing use of shoulder elevation.     See Function Navigator for Current Functional Status.   Therapy/Group: Individual Therapy  Raylene Everts [facility] 07/29/2016, 4:26 PM

## 2016-07-29 NOTE — Progress Notes (Signed)
Subjective/Complaints: Working with OT, patient is somewhat nervous about discharge on 9/ 9  Review of systems negative for chest pains, shortness of breath, nausea, vomiting, diarrhea, constipation, urinary or bowel incontinence.  Objective: Vital Signs: Blood pressure 120/79, pulse 66, temperature 97.8 F (36.6 C), temperature source Oral, resp. rate 16, height 6\' 4"  (1.93 m), weight 102.1 kg (225 lb), SpO2 98 %. No results found. No results found for this or any previous visit (from the past 72 hour(s)).   HEENT: normocephalic, atraumatic Cardio: RRR and No murmurs Resp: CTA B/L and Unlabored GI: BS positive and Nontender, nondistended Skin:   Facial eczema.  Neuro: Alert/Oriented, Cranial Nerve Abnormalities Left central 7 Motor LUE: 4/5 deltoid, biceps 4-/5, triceps 4-/5, finger flexion, extension are 3+/5.  LLE: Hip flexion 4/5, knee extension, 1+/5 ankle dorsiflexion  Tone:  Within Normal Limits Musc/Skel:  No edema.  Mild tenderness in upper left trap. No obvious shoulder subluxation. PROM normal left shoulder Gen. no acute distress. Vital signs reviewed.   Assessment/Plan: 1. 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 Physiatrist is providing close team supervision and 24 hour management of active medical problems listed below. Physiatrist and rehab team continue to assess barriers to discharge/monitor patient progress toward functional and medical goals. FIM: Function - Bathing Position: Shower Body parts bathed by patient: Right arm, Left arm, Chest, Abdomen, Front perineal area, Buttocks, Right upper leg, Left upper leg, Right lower leg, Left lower leg, Back Body parts bathed by helper: Back Bathing not applicable: Back Assist Level: Supervision or verbal cues, Set up Set up : To obtain  items  Function- Upper Body Dressing/Undressing What is the patient wearing?: Pull over shirt/dress Pull over shirt/dress - Perfomed by patient: Thread/unthread right sleeve, Thread/unthread left sleeve, Put head through opening, Pull shirt over trunk Pull over shirt/dress - Perfomed by helper: Thread/unthread left sleeve Assist Level: Supervision or verbal cues Set up : To obtain clothing/put away Function - Lower Body Dressing/Undressing What is the patient wearing?: Underwear, Pants, Ted Hose, Non-skid slipper socks Position: Wheelchair/chair at [employer] - Performed by patient: Thread/unthread right underwear leg, Thread/unthread left underwear leg, Pull underwear up/down Underwear - Performed by helper: Thread/unthread left underwear leg Pants- Performed by patient: Thread/unthread right pants leg, Thread/unthread left pants leg, Pull pants up/down, Fasten/unfasten pants Pants- Performed by helper: Thread/unthread left pants leg Non-skid slipper socks- Performed by patient: Don/doff right sock, Don/doff left sock Shoes - Performed by patient: Don/doff right shoe, Don/doff left shoe Shoes - Performed by helper: Don/doff left shoe AFO - Performed by patient: Don/doff right AFO AFO - Performed by helper: Don/doff left AFO TED Hose - Performed by helper: Don/doff right TED hose, Don/doff left TED hose Assist for footwear: Partial/moderate assist Assist for lower body dressing: Supervision or verbal cues  Function - Toileting Toileting steps completed by patient: Adjust clothing prior to toileting, Performs perineal hygiene, Adjust clothing after toileting Toileting Assistive Devices: Grab bar or rail Assist level: Supervision or verbal cues  Function - Air cabin crew transfer activity did not occur: N/A Toilet transfer assistive device: Grab bar, Walker Assist level to toilet: Touching or steadying assistance (Pt > 75%) Assist level from toilet: Touching or steadying  assistance (Pt > 75%)  Function - Chair/bed transfer Chair/bed transfer method: Ambulatory Chair/bed transfer assist level: Supervision or verbal cues Chair/bed transfer assistive device:  Armrests, Radio producer, Orthosis  Function - Locomotion: Wheelchair Will patient use wheelchair at discharge?: No Type: Manual Max wheelchair distance: 150 Assist Level: Supervision or verbal cues Assist Level: Supervision or verbal cues Assist Level: Supervision or verbal cues Turns around,maneuvers to table,bed, and toilet,negotiates 3% grade,maneuvers on rugs and over doorsills: No Function - Locomotion: Ambulation Assistive device: Walker-rolling, Cane-quad, Orthosis Max distance: 175 Assist level: Supervision or verbal cues Assist level: Supervision or verbal cues Assist level: Supervision or verbal cues Walk 150 feet activity did not occur: Safety/medical concerns Assist level: Supervision or verbal cues Walk 10 feet on uneven surfaces activity did not occur: Safety/medical concerns Assist level: Maximal assist (Pt 25 - 49%)  Function - Comprehension Comprehension: Auditory Comprehension assist level: Follows complex conversation/direction with no assist  Function - Expression Expression: Verbal Expression assist level: Expresses complex ideas: With no assist  Function - Social Interaction Social Interaction assist level: Interacts appropriately 90% of the time - Needs monitoring or encouragement for participation or interaction.  Function - Problem Solving Problem solving assist level: Solves basic 90% of the time/requires cueing < 10% of the time  Function - Memory Memory assist level: Recognizes or recalls 90% of the time/requires cueing < 10% of the time Patient normally able to recall (first 3 days only): Current season, Staff names and faces, That he or she is in a hospital, Location of own room  Medical Problem List and Plan: 1.  Left-sided weakness secondary to right posterior  basal ganglia infarct on 8/20  Cont CIR PT, OT  -  AFO for LLE   -Patient little apprehensive about discharge, we discussed that neither his arm or leg would be at 100% upon discharge and that his rehabilitation will be a longer-term process. 2.  DVT Prophylaxis/Anticoagulation: Subcutaneous Lovenox. Monitor platelet counts and any signs of bleeding 3. Pain Management/left shoulder girdle/neck pain  -only mild complaints continue to monitor   -continue supportive measures for LUE  -k tape per therapy  -continue kpad also 4. Hypertension: Improved  Norvasc 2.5 mg daily, increased to 5mg  on 8/26-Good control at present Vitals:   07/28/16 1500 07/29/16 0530  BP: 134/88 120/79  Pulse: 74 66  Resp: 16 16  Temp: 98.4 F (36.9 C) 97.8 F (36.6 C)   5. Neuropsych: This patient is capable of making decisions on his own behalf. 6. Skin/Wound Care:  Skin intact 7. Fluids/Electrolytes/Nutrition: Routine I&O's  8. GERD. Protonix- changed to pepcid,  Asymptomatic 9. Elevated lipids: apparently has an allergy to lipitor--"clots"                       -dietary choices and exercise to be reinforced, Total cholesterol 205, LDL 138, may be able to bring this down with diet only heart healthy diet, consider red yeast rice   -can explore pharmaceutical options as outpt based on follow up labs  LOS (Days) 13 A FACE TO FACE EVALUATION WAS PERFORMED   KIRSTEINS,ANDREW E 07/29/2016, 9:27 AM

## 2016-07-29 NOTE — Plan of Care (Signed)
Problem: RH PAIN MANAGEMENT Goal: RH STG PAIN MANAGED AT OR BELOW PT'S PAIN GOAL <3 Outcome: Progressing No c/o pain     

## 2016-07-29 NOTE — Progress Notes (Signed)
Occupational Therapy Session Note  Patient Details  Name: Jason Oconnor MRN: [id number] Date of Birth: 1956-10-06  Today's Date: 07/29/2016 OT Individual Time: [phone] and 1500-1600 OT Individual Time Calculation (min): 88 min and 60 min     Short Term Goals: Week 2:  OT Short Term Goal 1 (Week 2): Pt will complete toilet transfers with supervision with LRAD OT Short Term Goal 2 (Week 2): Pt will complete LB dressing with supervision and min cues OT Short Term Goal 3 (Week 2): Pt will utilize LUE as gross assist 50% of the time without cues during functional activity OT Short Term Goal 4 (Week 2): Pt will complete simple meal prep with supervision with LRAD  Skilled Therapeutic Interventions/Progress Updates:    Session 1:Upon entering the room, pt seated in recliner chair awaiting therapist with no c/o pain this session. Skilled OT intervention with focus on L UE/LE use, weight bearing/shifting, functional transfers, and balance with functional task. Pt ambulated 150' with RW and close supervision to ADL apartment. OT demonstrated and educated on shower transfer with use of shower chair and RW or cane to step over threshold safely in simulated environment. Pt engaged in bed making with use of RW and focus on weight bearing through L UE on RW while reaching forward to make bed. Pt with no LOB during task and needing supervision only. Pt engaged in sit <>stand from low, soft love seat without assistance. Pt ambulating to the gym where he engaged in holding beach ball with B UE's to provide visual feedback regarding control of movement for shoulder elevation, elbow flexion, extension, and chest presses with ball.  Pt ambulating back to room at end of session in same manner. Pt performed toilet task and grooming while standing at sink with use of RW and supervision for safety only. Pt returned to recliner chair with call bell and all needed items within reach upon exiting the room.   Session 2: Pt  transitioning easily from PT session as she provided supervision for pt to transfer onto toilet. Pt performing toileting with supervision overall for hygiene and clothing management. Pt ambulating with quad cane to obtain clothing items for self care task with supervision. Pt performed bathing at shower level with overall supervision with pt remaining seated with an occasional sit <>stand for hygiene. Pt returning to sit in wheelchair at sink for grooming tasks and UB self care at mod I level. Once dressed, OT demonstrated HEP for L UE hand strengthening and coordination with use of soft, tan theraputty. Pt returned demonstrations x 1 rep each with min verbal cues for proper technique. Paper handout provided. Pt returned to sitting in recliner chair at end of session with all needs within reach.   Therapy Documentation Precautions:  Precautions Precautions: Fall Precaution Comments: Monitor vitals Restrictions Weight Bearing Restrictions: No  See Function Navigator for Current Functional Status.   Therapy/Group: Individual Therapy  Phineas Semen 07/29/2016, 10:47 AM

## 2016-07-30 ENCOUNTER — Inpatient Hospital Stay (HOSPITAL_COMMUNITY): Payer: 59 | Admitting: Physical Therapy

## 2016-07-30 ENCOUNTER — Inpatient Hospital Stay (HOSPITAL_COMMUNITY): Payer: 59 | Admitting: Occupational Therapy

## 2016-07-30 NOTE — Progress Notes (Signed)
Physical Therapy Session Note  Patient Details  Name: Jason Oconnor MRN: [id number] Date of Birth: 1955/12/18  Today's Date: 07/30/2016 PT Individual Time: [id number] PT Individual Time Calculation (min): 60 min    Short Term Goals: Week 2:  PT Short Term Goal 1 (Week 2): = LTG of Mod I overall for D/C 08/03/16  Skilled Therapeutic Interventions/Progress Updates:  Pt received in recliner; assisted pt with donning TEDs and shoes.  Pt performed transfer to toilet with cane and orthosis with supervision and performed all toileting tasks with supervision. Pt ambulated to gym with quad cane and orthosis and supervision.  Performed NMR with e-stim; see below for details.  Returned to room and pt returned to recliner to rest and await OT session.     Therapy Documentation Precautions:  Precautions Precautions: Fall Precaution Comments: Monitor vitals Restrictions Weight Bearing Restrictions: No Pain:  No reports of pain Other Treatments: Treatments Neuromuscular Facilitation: Left;Lower Extremity;Upper Extremity;Forced use;Activity to increase coordination;Activity to increase motor control;Activity to increase timing and sequencing;Activity to increase grading;Activity to increase sustained activation;Activity to increase lateral weight shifting;Activity to increase anterior-posterior weight shifting with Electrical simulation/NMES applied to L anterior tibialis mm and hamstring muscles for activation training while in tall kneeling during 10 reps kneeling squats, alternating LE advancement laterally, forwards and back to focus on trunk control and weight shifting + maintaining R and L half kneeling with min-mod A.  Returned to sitting and performed gait with use of Estim to facilitate ankle DF and hamstring activation during gait for increased foot clearance, knee flexion in swing phase + closed chain DF and hamstring activation in stance for forward weight shift and knee control with quad cane and min  HHA.    Modalities Modalities: Electrical Stimulation   See Function Navigator for Current Functional Status.   Therapy/Group: Individual Therapy  Raylene Everts [facility] 07/30/2016, 10:49 AM

## 2016-07-30 NOTE — Progress Notes (Signed)
Subjective/Complaints: No pain complaints, still has occasional swelling. Left lower extremity  Review of systems negative for chest pains, shortness of breath, nausea, vomiting, diarrhea, constipation, urinary or bowel incontinence.  Objective: Vital Signs: Blood pressure 136/76, pulse 67, temperature 98.8 F (37.1 C), temperature source Oral, resp. rate 18, height 6\' 4"  (1.93 m), weight 102.1 kg (225 lb), SpO2 97 %. No results found. No results found for this or any previous visit (from the past 72 hour(s)).   HEENT: normocephalic, atraumatic Cardio: RRR and No murmurs Resp: CTA B/L and Unlabored GI: BS positive and Nontender, nondistended Skin:   Facial eczema.  Neuro: Alert/Oriented, Cranial Nerve Abnormalities Left central 7 Motor LUE: 4/5 deltoid, biceps 4-/5, triceps 4-/5, finger flexion, extension are 3+/5.  LLE: Hip flexion 4/5, knee extension, 1+/5 ankle dorsiflexion  Tone:  Within Normal Limits Musc/Skel: Mild foot and ankle edema. Left  Mild tenderness in upper left trap. No obvious shoulder subluxation. PROM normal left shoulder Gen. no acute distress. Vital signs reviewed.   Assessment/Plan: 1. 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 Physiatrist is providing close team supervision and 24 hour management of active medical problems listed below. Physiatrist and rehab team continue to assess barriers to discharge/monitor patient progress toward functional and medical goals. FIM: Function - Bathing Position: Shower Body parts bathed by patient: Right arm, Left arm, Chest, Abdomen, Front perineal area, Buttocks, Right upper leg, Left upper leg, Right lower leg, Left lower leg, Back Body parts bathed by helper: Back Bathing not applicable: Back Assist Level: Supervision or verbal cues, Set  up Set up : To obtain items  Function- Upper Body Dressing/Undressing What is the patient wearing?: Pull over shirt/dress Pull over shirt/dress - Perfomed by patient: Thread/unthread right sleeve, Thread/unthread left sleeve, Put head through opening, Pull shirt over trunk Pull over shirt/dress - Perfomed by helper: Thread/unthread left sleeve Assist Level: More than reasonable time Set up : To obtain clothing/put away Function - Lower Body Dressing/Undressing What is the patient wearing?: Underwear, Pants, Non-skid slipper socks Position: Wheelchair/chair at sink Underwear - Performed by patient: Thread/unthread right underwear leg, Thread/unthread left underwear leg, Pull underwear up/down Underwear - Performed by helper: Thread/unthread left underwear leg Pants- Performed by patient: Thread/unthread right pants leg, Thread/unthread left pants leg, Pull pants up/down, Fasten/unfasten pants Pants- Performed by helper: Thread/unthread left pants leg Non-skid slipper socks- Performed by patient: Don/doff right sock, Don/doff left sock Shoes - Performed by patient: Don/doff right shoe, Don/doff left shoe Shoes - Performed by helper: Don/doff left shoe AFO - Performed by patient: Don/doff right AFO AFO - Performed by helper: Don/doff left AFO TED Hose - Performed by helper: Don/doff right TED hose, Don/doff left TED hose Assist for footwear: Supervision/touching assist Assist for lower body dressing: Supervision or verbal cues  Function - Toileting Toileting steps completed by patient: Adjust clothing prior to toileting, Performs perineal hygiene, Adjust clothing after toileting Toileting Assistive Devices: Grab bar or rail Assist level: Supervision or verbal cues  Function - Air cabin crew transfer activity did not occur: N/A Toilet transfer assistive device: Cane Assist level to toilet: Supervision or verbal cues Assist level from toilet: Supervision or verbal  cues  Function - Chair/bed transfer Chair/bed transfer method: Ambulatory Chair/bed transfer assist level: Supervision or verbal cues Chair/bed transfer assistive device: Radio producer, Orthosis  Function - Locomotion: Wheelchair Will patient  use wheelchair at discharge?: No Type: Manual Max wheelchair distance: 150 Assist Level: Supervision or verbal cues Assist Level: Supervision or verbal cues Assist Level: Supervision or verbal cues Turns around,maneuvers to table,bed, and toilet,negotiates 3% grade,maneuvers on rugs and over doorsills: No Function - Locomotion: Ambulation Assistive device: Cane-quad, Orthosis Max distance: 150 Assist level: Touching or steadying assistance (Pt > 75%) Assist level: Touching or steadying assistance (Pt > 75%) Assist level: Touching or steadying assistance (Pt > 75%) Walk 150 feet activity did not occur: Safety/medical concerns Assist level: Touching or steadying assistance (Pt > 75%) Walk 10 feet on uneven surfaces activity did not occur: Safety/medical concerns Assist level: Touching or steadying assistance (Pt > 75%)  Function - Comprehension Comprehension: Auditory Comprehension assist level: Follows complex conversation/direction with no assist  Function - Expression Expression: Verbal Expression assist level: Expresses complex ideas: With no assist  Function - Social Interaction Social Interaction assist level: Interacts appropriately with others with medication or extra time (anti-anxiety, antidepressant).  Function - Problem Solving Problem solving assist level: Solves basic 90% of the time/requires cueing < 10% of the time  Function - Memory Memory assist level: Recognizes or recalls 90% of the time/requires cueing < 10% of the time Patient normally able to recall (first 3 days only): Current season, Staff names and faces, That he or she is in a hospital, Location of own room  Medical Problem List and Plan: 1.  Left-sided weakness  secondary to right posterior basal ganglia infarct on 8/20  Cont CIR PT, OT, discharge conference in a.m. -  AFO for LLE   - 2.  DVT Prophylaxis/Anticoagulation: Subcutaneous Lovenox. Monitor platelet counts and any signs of bleeding 3. Pain Management/left shoulder girdle/neck pain  -No further complaints  -continue supportive measures for LUE  -k tape per therapy  -continue kpad also 4. Hypertension: Improved  Norvasc  increased to 5mg  on 8/26-Good control at present Vitals:   07/29/16 1506 07/30/16 0542  BP: 132/83 136/76  Pulse: 71 67  Resp: 16 18  Temp: 97.9 F (36.6 C) 98.8 F (37.1 C)   5. Neuropsych: This patient is capable of making decisions on his own behalf. 6. Skin/Wound Care:  Skin intact, Foot and ankle edema. Left side related to decreased motor function 7. Fluids/Electrolytes/Nutrition: Routine I&O's  8. GERD. Protonix- changed to pepcid,  Asymptomatic 9. Elevated lipids: apparently has an allergy to lipitor--"clots"                       -dietary choices and exercise to be reinforced, Total cholesterol 205, LDL 138, may be able to bring this down with diet only heart healthy diet, consider red yeast rice   -can explore pharmaceutical options as outpt based on follow up labs  LOS (Days) 14 A FACE TO FACE EVALUATION WAS PERFORMED   Brittan Mapel E 07/30/2016, 8:44 AM

## 2016-07-30 NOTE — Progress Notes (Signed)
Occupational Therapy Session Note  Patient Details  Name: Lagregory Hori MRN: [id number] Date of Birth: 1956-04-25  Today's Date: 07/30/2016   1st session 11:30-12:00 (15min) OT Individual Time: 1415-1500 OT Individual Time Calculation (min): 45 min     Short Term Goals: Week 2:  OT Short Term Goal 1 (Week 2): Pt will complete toilet transfers with supervision with LRAD OT Short Term Goal 2 (Week 2): Pt will complete LB dressing with supervision and min cues OT Short Term Goal 3 (Week 2): Pt will utilize LUE as gross assist 50% of the time without cues during functional activity OT Short Term Goal 4 (Week 2): Pt will complete simple meal prep with supervision with LRAD  Skilled Therapeutic Interventions/Progress Updates:    1:1 1st session. Pt abulated with quad cane with steadying A to supervision at slow speed to increased control of left LE. Once in gym perform OTAGO exercises with focus on normal patterns of movement, dynamic balance and core/trunk control with movement to improve standing balance during functional tasks. Pt given a handout for home to perform with supervision for balance. During ambulation back to room focus on normal swing of left UE with mobility with therapist A to guide movement- difficulty focusing on left LE control WITH UE movement  2nd session  1:1 Focus on self care retraining with heavy reliance on functional left UE use: during gathering articles for ADL, carrying items. Pt still required mod cuing for generating way to incorportate left UE in everyday activities. Pt even practiced picking up dropped clothing from the floor with left UE from a squat position with close supervision. Pt required min A from therapist or right hand to use left hand to obtain grooming items from bedside table on the left side. Pt donned t shirt in standing requiring min A one time to regain balance with LOB when threading over his head. Left in recliner to rest with foot elevating left  LE.   Therapy Documentation Precautions:  Precautions Precautions: Fall Precaution Comments: Monitor vitals Restrictions Weight Bearing Restrictions: No Pain:  no  C/o pain in either session     See Function Navigator for Current Functional Status.   Therapy/Group: Individual Therapy  Willeen Cass [facility] 07/30/2016, 2:28 PM

## 2016-07-30 NOTE — Progress Notes (Signed)
Occupational Therapy Session Note  Patient Details  Name: [name] MRN: [id number] Date of Birth: [birth year]  Today's Date: 07/30/2016 OT Individual Time: 1002-1102 OT Individual Time Calculation (min): 60 min     Short Term Goals: Week 2:  OT Short Term Goal 1 (Week 2): Pt will complete toilet transfers with supervision with LRAD OT Short Term Goal 2 (Week 2): Pt will complete LB dressing with supervision and min cues OT Short Term Goal 3 (Week 2): Pt will utilize LUE as gross assist 50% of the time without cues during functional activity OT Short Term Goal 4 (Week 2): Pt will complete simple meal prep with supervision with LRAD  Skilled Therapeutic Interventions/Progress Updates:    Treatment session with focus on LUE NMR and strengthening.  Pt ambulated to room toilet with quad cane and completed toileting with distant supervision.  Ambulated to [facility] gym to engage in [place] in standing with focus on weight shifting through LLE during functional reaching and focus on quality of movement when reaching.  Completed 3 trials on Dynavision with improved weight shift and reaction time during reaching from trial 1 to 2, however diminished quality of movement and weight shift during 3rd and final trial due to fatigue and development of poor movement quality.  Engaged in towel glides on angled surface to address further ROM and stretch at end point while eliminating gravity.  Engaged in 3 sets of 10 wall pushups with focus on strengthening shoulder girdle and upper back to decrease compensatory movements.  Returned to room and left on toilet with nurse tech notified of location.  Therapy Documentation Precautions:  Precautions Precautions: Fall Precaution Comments: Monitor vitals Restrictions Weight Bearing Restrictions: No Pain:   Pt with no c/o pain  See Function Navigator for Current Functional Status.   Therapy/Group: Individual Therapy  Simonne Come 07/30/2016, 11:36 AM

## 2016-07-30 NOTE — Progress Notes (Signed)
Pt refusing PRAFO boot despite education. Timoteo Ace, RN

## 2016-07-31 ENCOUNTER — Inpatient Hospital Stay (HOSPITAL_COMMUNITY): Payer: 59 | Admitting: Physical Therapy

## 2016-07-31 ENCOUNTER — Inpatient Hospital Stay (HOSPITAL_COMMUNITY): Payer: 59 | Admitting: Occupational Therapy

## 2016-07-31 NOTE — Progress Notes (Signed)
Subjective/Complaints: Patient is ambulating with occupational therapy using AFO as well as Cane. Starting to do ADLs in a standing position  Review of systems negative for chest pains, shortness of breath, nausea, vomiting, diarrhea, constipation, urinary or bowel incontinence.  Objective: Vital Signs: Blood pressure 115/82, pulse 67, temperature 97.5 F (36.4 C), temperature source Oral, resp. rate 16, height 6' 4"  (1.93 m), weight 102.1 kg (225 lb), SpO2 97 %. No results found. No results found for this or any previous visit (from the past 72 hour(s)).   HEENT: normocephalic, atraumatic Cardio: RRR and No murmurs Resp: CTA B/L and Unlabored GI: BS positive and Nontender, nondistended Skin:   Facial eczema.  Neuro: Alert/Oriented, Cranial Nerve Abnormalities Left central 7 Motor LUE: 4/5 deltoid, biceps 4-/5, triceps 4-/5, finger flexion, extension are 3+/5.  LLE: Hip flexion 4/5, knee extension, 1+/5 ankle dorsiflexion  Tone:  Within Normal Limits Musc/Skel: Mild foot and ankle edema. Left  Mild tenderness in upper left trap. No obvious shoulder subluxation. PROM normal left shoulder Gen. no acute distress. Vital signs reviewed.   Assessment/Plan: 1. 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 Physiatrist is providing close team supervision and 24 hour management of active medical problems listed below. Physiatrist and rehab team continue to assess barriers to discharge/monitor patient progress toward functional and medical goals. FIM: Function - Bathing Position: Shower Body parts bathed by patient: Right arm, Left arm, Chest, Abdomen, Front perineal area, Buttocks, Right upper leg, Left upper leg, Right lower leg, Left lower leg, Back Body parts bathed by helper: Back Bathing not applicable:  Back Assist Level: Supervision or verbal cues, Set up Set up : To obtain items  Function- Upper Body Dressing/Undressing What is the patient wearing?: Pull over shirt/dress Pull over shirt/dress - Perfomed by patient: Thread/unthread right sleeve, Thread/unthread left sleeve, Put head through opening, Pull shirt over trunk Pull over shirt/dress - Perfomed by helper: Thread/unthread left sleeve Assist Level: More than reasonable time Set up : To obtain clothing/put away Function - Lower Body Dressing/Undressing What is the patient wearing?: Underwear, Pants, Non-skid slipper socks Position: Wheelchair/chair at sink Underwear - Performed by patient: Thread/unthread right underwear leg, Thread/unthread left underwear leg, Pull underwear up/down Underwear - Performed by helper: Thread/unthread left underwear leg Pants- Performed by patient: Thread/unthread right pants leg, Thread/unthread left pants leg, Pull pants up/down, Fasten/unfasten pants Pants- Performed by helper: Thread/unthread left pants leg Non-skid slipper socks- Performed by patient: Don/doff right sock, Don/doff left sock Shoes - Performed by patient: Don/doff right shoe, Don/doff left shoe Shoes - Performed by helper: Don/doff left shoe AFO - Performed by patient: Don/doff right AFO AFO - Performed by helper: Don/doff left AFO TED Hose - Performed by helper: Don/doff right TED hose, Don/doff left TED hose Assist for footwear: Supervision/touching assist Assist for lower body dressing: Supervision or verbal cues  Function - Toileting Toileting steps completed by patient: Adjust clothing prior to toileting, Performs perineal hygiene, Adjust clothing after toileting Toileting Assistive Devices: Grab bar or rail Assist level: Supervision or verbal cues  Function - Air cabin crew transfer activity did not occur: N/A Toilet transfer assistive device: Cane Assist level to toilet: Supervision or verbal cues Assist  level from toilet: Supervision or verbal cues  Function - Chair/bed transfer Chair/bed transfer method: Ambulatory Chair/bed transfer assist level: Supervision or verbal cues Chair/bed transfer assistive  device: Cane, Orthosis  Function - Locomotion: Wheelchair Will patient use wheelchair at discharge?: No Type: Manual Max wheelchair distance: 150 Assist Level: Supervision or verbal cues Assist Level: Supervision or verbal cues Assist Level: Supervision or verbal cues Turns around,maneuvers to table,bed, and toilet,negotiates 3% grade,maneuvers on rugs and over doorsills: No Function - Locomotion: Ambulation Assistive device: Cane-quad, Orthosis Max distance: 150 Assist level: Supervision or verbal cues Assist level: Supervision or verbal cues Assist level: Supervision or verbal cues Walk 150 feet activity did not occur: Safety/medical concerns Assist level: Supervision or verbal cues Walk 10 feet on uneven surfaces activity did not occur: Safety/medical concerns Assist level: Touching or steadying assistance (Pt > 75%)  Function - Comprehension Comprehension: Auditory Comprehension assist level: Follows complex conversation/direction with no assist  Function - Expression Expression: Verbal Expression assist level: Expresses complex ideas: With no assist  Function - Social Interaction Social Interaction assist level: Interacts appropriately with others with medication or extra time (anti-anxiety, antidepressant).  Function - Problem Solving Problem solving assist level: Solves basic 90% of the time/requires cueing < 10% of the time  Function - Memory Memory assist level: Recognizes or recalls 90% of the time/requires cueing < 10% of the time Patient normally able to recall (first 3 days only): Current season, Staff names and faces, That he or she is in a hospital, Location of own room  Medical Problem List and Plan: 1.  Left-sided weakness secondary to right posterior  basal ganglia infarct on 8/20  Cont CIR PT, OT, Team conference today please see physician documentation under team conference tab, met with team face-to-face to discuss problems,progress, and goals. Formulized individual treatment plan based on medical history, underlying problem and comorbidities. -  AFO for LLE   - 2.  DVT Prophylaxis/Anticoagulation: Subcutaneous Lovenox. Monitor platelet counts and any signs of bleeding 3. Pain Management/left shoulder girdle/neck pain  -No further complaints  -continue supportive measures for LUE  -k tape per therapy  -continue kpad also 4. Hypertension: Improved  Norvasc  increased to 29m on 8/26-Good control at present, monitor for hypotension Vitals:   07/30/16 1456 07/31/16 0522  BP: 139/80 115/82  Pulse: 77 67  Resp: 18 16  Temp: 98.4 F (36.9 C) 97.5 F (36.4 C)   5. Neuropsych: This patient is capable of making decisions on his own behalf. 6. Skin/Wound Care:  Skin intact, Foot and ankle edema. Left side related to decreased motor function, Continue elevation, retrograde massage, compression hose, discussed low-salt diet 7. Fluids/Electrolytes/Nutrition: Routine I&O's  8. GERD. Protonix- changed to pepcid,  Asymptomatic 9. Elevated lipids: apparently has an allergy to lipitor--"clots"                       -dietary choices and exercise to be reinforced, Total cholesterol 205, LDL 138, may be able to bring this down with diet only heart healthy diet, consider red yeast rice   -can explore pharmaceutical options as outpt based on follow up labs  LOS (Days) 15 A FACE TO FACE EVALUATION WAS PERFORMED   Marcha Licklider E 07/31/2016, 9:51 AM

## 2016-07-31 NOTE — Progress Notes (Signed)
Occupational Therapy Weekly Progress Note  Patient Details  Name: Jason Oconnor MRN: [id number] Date of Birth: February 05, 1956  Beginning of progress report period: July 24, 2016 End of progress report period: July 31, 2016  Today's Date: 07/31/2016 OT Individual Time: 0815-0900 and 1405-1505 OT Individual Time Calculation (min): 45 min and 60 min   Patient has met 4 of 4 short term goals.  Pt has progressed to supervision overall with bathing, dressing, and functional mobility with use of large base quad cane.  Pt continues to require cues to increase use of LUE in functional tasks.  Pt demonstrating improved motor control with LUE with reaching and gross motor tasks during therapy but requiring cues to problem solve increased use during functional tasks.  Have discussed DME and recommendation for OPOT with pt reporting having acquired a shower seat from his daughter and is able to get up from handicap height without use of UE.  Plan for wife to come in for family education prior to d/c, however date/time is still to be scheduled.  Patient continues to demonstrate the following deficits: Lt hemiplegia, decreased attention to LUE and LLE during functional tasks and mobility, impaired sensation in LUE, decreased balance reactions and dynamic standing balance and therefore will continue to benefit from skilled OT intervention to enhance overall performance with BADL, iADL and Reduce care partner burden.  Patient progressing toward long term goals..  Continue plan of care.  OT Short Term Goals Week 2:  OT Short Term Goal 1 (Week 2): Pt will complete toilet transfers with supervision with LRAD OT Short Term Goal 1 - Progress (Week 2): Met OT Short Term Goal 2 (Week 2): Pt will complete LB dressing with supervision and min cues OT Short Term Goal 2 - Progress (Week 2): Met OT Short Term Goal 3 (Week 2): Pt will utilize LUE as gross assist 50% of the time without cues during functional activity OT  Short Term Goal 3 - Progress (Week 2): Met OT Short Term Goal 4 (Week 2): Pt will complete simple meal prep with supervision with LRAD OT Short Term Goal 4 - Progress (Week 2): Met Week 3:  OT Short Term Goal 1 (Week 3): STG = LTGs due to remaining LOS   Skilled Therapeutic Interventions/Progress Updates:    1) Treatment session with focus on dynamic standing balance, LUE NMR, and functional use of LUE during self-care tasks.  Pt deferred bathing to PM session but wanted to change shirts and complete grooming tasks prior to community outing.  Pt utilized LUE as gross assist - diminished level to gather items prior to grooming and transported items to sink.  Completed grooming in standing with use of LUE to hold items and when not in use placed LUE into weight bearing position.  Donned shoes with AFO with setup assist only.  Ambulated to Day Room with quad cane and supervision.  Engaged in [place] with pegs with focus on fine motor control and use of thumb and first finger opposition as well as flexion to increase success with picking pegs up from table and manipulating to place into foam peg board.  Increased challenge to navigate around pegs to select specific pegs.  Pt met all challenges with increased time but success.  Educated on use of rest breaks and restarting activities if/when they become a challenge or he ceases to be successful to reset and then resume.  2) Treatment session with focus on functional mobility and safety in kitchen during simple meal  prep as well as completing self-care tasks with increased independence and safety.  Pt ambulated to ADL apt with quad cane and supervision.  Engaged in simple meal prep of frying an egg with pt retrieving items from various cabinet heights and refrigerator with quad cane and min cues for energy conservation strategies and use of counters to decrease burden on LUE with transporting items.  Discussed safety with cooking at home due to mild impairments  in sensation in Lt hand/fingers.  Returned to room and completed bathing and dressing at sit > stand level in room shower at supervision - Mod I level.  Pt voided in toilet in standing at supervision - Mod I level.  Donned shirt in standing and completed grooming in standing with use of LUE as gross assist - diminished level with improved carryover and decreased cues to incorporate into functional tasks.  Discussed d/c plan and upcoming family education with wife, with pt reporting no questions at this time.  Therapy Documentation Precautions:  Precautions Precautions: Fall Precaution Comments: Monitor vitals Restrictions Weight Bearing Restrictions: No General:   Vital Signs: Therapy Vitals Temp: 97.5 F (36.4 C) Temp Source: Oral Pulse Rate: 67 Resp: 16 BP: 115/82 Patient Position (if appropriate): Lying Oxygen Therapy SpO2: 97 % O2 Device: Not Delivered Pain:   Pt with no c/o pain  See Function Navigator for Current Functional Status.   Therapy/Group: Individual Therapy  Simonne Come 07/31/2016, 7:15 AM

## 2016-07-31 NOTE — Progress Notes (Signed)
Physical Therapy Session Note  Patient Details  Name: Ryaan Battaglia MRN: [id number] Date of Birth: [birth year]  Today's Date: 07/31/2016 PT Individual Time:  -       Short Term Goals: Week 2:  PT Short Term Goal 1 (Week 2): = LTG of Mod I overall for D/C 08/03/16  Skilled Therapeutic Interventions/Progress Updates:  Pt participated in community outing with PT and recreation therapy to [place] with focus on community reintegration.  Pt focused on safety with stair negotiation on/off van and sequencing, safety with gait with SPC and shopping cart for energy conservation and orthosis >500' in community environment over level and uneven terrain, around obstacles and while carrying object in LUE all with supervision.  Discussed with pt energy conservation techniques, safety issues in community regarding flooring and obstacles in store and in public restrooms.  Also discussed safety risks of divided attention during gait and recommendation to cease gait to engage in conversation or to scan environment.  Pt able to ambulate full distance without need for seated rest break. At end of outing pt returned to room and to recliner to rest with supervision.   Therapy Documentation Precautions:  Precautions Precautions: Fall Precaution Comments: Monitor vitals Restrictions Weight Bearing Restrictions: No Vital Signs: Therapy Vitals Temp: 97.5 F (36.4 C) Temp Source: Oral Pulse Rate: 67 Resp: 16 BP: 115/82 Patient Position (if appropriate): Lying Oxygen Therapy SpO2: 97 % O2 Device: Not Delivered Pain:  None   See Function Navigator for Current Functional Status.   Therapy/Group: [employer] with recreation therapy  Raylene Everts Faucette 07/31/2016, 8:20 AM

## 2016-08-01 ENCOUNTER — Inpatient Hospital Stay (HOSPITAL_COMMUNITY): Payer: 59 | Admitting: Occupational Therapy

## 2016-08-01 ENCOUNTER — Ambulatory Visit (HOSPITAL_COMMUNITY): Payer: 59 | Admitting: Physical Therapy

## 2016-08-01 ENCOUNTER — Inpatient Hospital Stay (HOSPITAL_COMMUNITY): Payer: 59 | Admitting: Physical Therapy

## 2016-08-01 NOTE — Progress Notes (Addendum)
Physical Therapy Note  Patient Details  Name: Jason Oconnor MRN: [id number] Date of Birth: 1956-01-29 Today's Date: 08/01/2016    Time: 915-942 27 minutes  1:1 No c/o pain.  Floor transfer with PT demonstrating and pt able to perform with supervision, cues for sequencing and safety.  Gait throughout unit in controlled environment with [facility] with supervision.  Obstacle negotiation and stepping over obstacles with supervision for side step and obstacle negotiation, min A for balance with stepping over 3'' obstacles.  Squat and reach activity for L UE and LE strengthening with supervision for squatting, increased time for L UE control. Pt fatigued at end of session but states he feels ready for family ed today.   Time 2: 1415-1503 48 minutes  1:1 No c/o pain. Session focused on pt/family ed with pt and wife. Pt/wife performed with supervision gait in home and controlled environments, curb/step negotiation to simulate home entry, car transfers to simulated small SUV, ramp negotiation, gait on uneven surface and bed mobility. Pt/wife educated on home safety recommendations and ideas to incorporate therapeutic tasks into daily routine, both express understanding. Pt/wife express they feel comfortable with planned d/c on Saturday.  Addie Cederberg 08/01/2016, 9:42 AM

## 2016-08-01 NOTE — Discharge Instructions (Signed)
Inpatient Rehab Discharge Instructions  Jason Oconnor Discharge date and time: No discharge date for patient encounter.   Activities/Precautions/ Functional Status: Activity: activity as tolerated Diet: regular diet Wound Care: keep wound clean and dry Functional status:  ___ No restrictions     ___ Walk up steps independently ___ 24/7 supervision/assistance   ___ Walk up steps with assistance ___ Intermittent supervision/assistance  ___ Bathe/dress independently ___ Walk with walker     _x__ Bathe/dress with assistance ___ Walk Independently    ___ Shower independently ___ Walk with assistance    ___ Shower with assistance ___ No alcohol     ___ Return to work/school ________  COMMUNITY REFERRALS UPON DISCHARGE:   Outpatient: PT     OT  Agency:  [facility] Phone:  [phone]  Appointment Date/Time:  Wednesday, September 13th at 3pm PT and 4pm OT Medical Equipment/Items Ordered: wide base quad cane  Agency/Supplier:  [employer]       Phone:  [phone] Other:  For Neuropsychology follow up for further testing:                 [facility], [address]                  [place], [place]  [address]                   [phone]                 * Or a neuropsychologist closer to you, if you prefer.  GENERAL COMMUNITY RESOURCES FOR PATIENT/FAMILY: Support Groups:   [place] Stroke Support Group This support group for stroke survivors, their families and friends meets on the second Thursday of each month from 3 to 4 p.m. (September-May) at [employer]. [facility], [address], [place]. For more information, call Benay Pillow at [phone] between 8 a.m. and 5 p.m. Monday through Thursday. Stroke [place] Provides support and education to stroke survivors and their families. When: The fourth Tuesday of each month from noon until  1:30 p.m., March through December. Info: Reservations are appreciated but not required. For reservations or more information, call [phone].   Special Instructions:  No driving STROKE/TIA DISCHARGE INSTRUCTIONS SMOKING Cigarette smoking nearly doubles your risk of having a stroke & is the single most alterable risk factor  If you smoke or have smoked in the last 12 months, you are advised to quit smoking for your health.  Most of the excess cardiovascular risk related to smoking disappears within a year of stopping.  Ask you doctor about anti-smoking medications  [place] Quit Line: 1-800-QUIT NOW  Free Smoking Cessation Classes [phone]  CHOLESTEROL Know your levels; limit fat & cholesterol in your diet  Lipid Panel     Component Value Date/Time   CHOL 205 (H) 07/15/2016 0542   TRIG 139 07/15/2016 0542   HDL 39 (L) 07/15/2016 0542   CHOLHDL 5.3 07/15/2016 0542   VLDL 28 07/15/2016 0542   LDLCALC 138 (H) 07/15/2016 0542      Many patients benefit from treatment even if their cholesterol is at goal.  Goal: Total Cholesterol (CHOL) less than 160  Goal:  Triglycerides (TRIG) less than 150  Goal:  HDL greater than 40  Goal:  LDL (LDLCALC) less than 100  BLOOD PRESSURE American Stroke Association blood pressure target is less that 120/80 mm/Hg  Your discharge blood pressure is:  BP: 136/76  Monitor your blood pressure  Limit your salt and alcohol intake  Many individuals will require more than one medication for high blood pressure  DIABETES (A1c is a blood sugar average for last 3 months) Goal HGBA1c is under 7% (HBGA1c is blood sugar average for last 3 months)  Diabetes: No known diagnosis of diabetes    No results found for: HGBA1C   Your HGBA1c can be lowered with medications, healthy diet, and exercise.  Check your blood sugar as directed by your physician  Call your physician if you experience unexplained or low blood sugars.  PHYSICAL  ACTIVITY/REHABILITATION Goal is 30 minutes at least 4 days per week  Activity: Increase activity slowly, Therapies: Physical Therapy: Home Health Return to work:   Activity decreases your risk of heart attack and stroke and makes your heart stronger.  It helps control your weight and blood pressure; helps you relax and can improve your mood.  Participate in a regular exercise program.  Talk with your doctor about the best form of exercise for you (dancing, walking, swimming, cycling).  DIET/WEIGHT Goal is to maintain a healthy weight  Your discharge diet is: Diet regular Room service appropriate? Yes; Fluid consistency: Thin  liquids Your height is:  Height: 6\' 4"  (193 cm) Your current weight is: Weight: 102.1 kg (225 lb) Your Body Mass Index (BMI) is:     Following the type of diet specifically designed for you will help prevent another stroke.  Your goal weight range is:    Your goal Body Mass Index (BMI) is 19-24.  Healthy food habits can help reduce 3 risk factors for stroke:  High cholesterol, hypertension, and excess weight.  RESOURCES Stroke/Support Group:  Call [phone]   STROKE EDUCATION PROVIDED/REVIEWED AND GIVEN TO PATIENT Stroke warning signs and symptoms How to activate emergency medical system (call 911). Medications prescribed at discharge. Need for follow-up after discharge. Personal risk factors for stroke. Pneumonia vaccine given:  Flu vaccine given:  My questions have been answered, the writing is legible, and I understand these instructions.  I will adhere to these goals & educational materials that have been provided to me after my discharge from the hospital.     My questions have been answered and I understand these instructions. I will adhere to these goals and the provided educational materials after my discharge from the hospital.  Patient/Caregiver Signature _______________________________ Date __________  Clinician Signature  _______________________________________ Date __________  Please bring this form and your medication list with you to all your follow-up doctor's appointments.

## 2016-08-01 NOTE — Progress Notes (Addendum)
Physical Therapy Discharge Summary  Patient Details  Name: Jason Oconnor MRN: [id number] Date of Birth: 1956-04-11  Today's Date: 08/02/2016 PT Individual Time: 0930-1028 PT Individual Time Calculation (min): 58 min   Patient has met 10 of 10 long term goals due to improved activity tolerance, improved balance, improved postural control, increased strength, decreased pain, ability to compensate for deficits, functional use of  left upper extremity and left lower extremity, improved attention and improved awareness.  Patient to discharge at an ambulatory level Modified Independent.   Patient's care partner is independent to provide the necessary supervision assistance at discharge.  Reasons goals not met: All goals met  Recommendation:  Patient will benefit from ongoing skilled PT services in outpatient setting to continue to advance safe functional mobility, address ongoing impairments in L hemiparesis with impaired motor control, timing and sequencing, impaired postural control, balance, gait, and minimize fall risk.  Equipment: L AFO, wide based quad cane  Reasons for discharge: treatment goals met and discharge from hospital  Patient/family agrees with progress made and goals achieved: Yes  PT Discharge Pt participated in reassessment of strength, sensation, coordination, transfers, bed mobility, gait and stair negotiation as documented below.  Pt performed NMR for hamstring and hip stability training in prone with 10 reps L hamstring curls, hip extension, Sidelying L hip ABD, quadruped L and R hip extension and supine bilat LE bridges with LE straight and feet on therapy ball + 2 sets lifting RLE off of ball with LLE stabilizing.  Performed higher level gait training with cane over tile and carpet during dual task of vertical and horizontal head turns and attending to environment and naming activity.  At end of session pt left in recliner with all items within  reach.  Precautions/RestrictionsPrecautions Precautions: Fall Precaution Comments: Monitor vitals Restrictions Weight Bearing Restrictions: No Pain Pain Assessment Pain Assessment: No/denies pain Faces Pain Scale: Hurts a little bit Pain Type: Acute pain Pain Location: Back Pain Orientation: Lower Pain Descriptors / Indicators: Pressure (Stiffness) Pain Onset: On-going Pain Intervention(s): Ambulation/increased activity Cognition Overall Cognitive Status: Within Functional Limits for tasks assessed Arousal/Alertness: Awake/alert Orientation Level: Oriented X4 Memory: Appears intact Awareness: Appears intact Problem Solving: Appears intact Safety/Judgment: Appears intact Sensation Sensation Light Touch: Appears Intact Stereognosis: Not tested Hot/Cold: Not tested Proprioception: Appears Intact Coordination Gross Motor Movements are Fluid and Coordinated: Yes Fine Motor Movements are Fluid and Coordinated: No Coordination and Movement Description: Brunnstrum stage V in the left arm and hand.  Uses as an active assist for bathing with increased time.  Unable to pull up pants with the LUE at this time.   Motor  Motor Motor: Hemiplegia;Motor impersistence  Mobility Bed Mobility Bed Mobility: Sit to Supine;Supine to Sit Supine to Sit: 6: Modified independent (Device/Increase time) Sit to Supine: 6: Modified independent (Device/Increase time) Transfers Sit to Stand: 6: Modified independent (Device/Increase time);With upper extremity assist;With armrests;From toilet Stand to Sit: 6: Modified independent (Device/Increase time);With upper extremity assist;With armrests;To toilet Stand Pivot Transfers: 6: Modified independent (Device/Increase time) Locomotion  Ambulation Ambulation/Gait Assistance: 6: Modified independent (Device/Increase time) Ambulation Distance (Feet): 150 Feet Assistive device: Large base quad cane Gait Gait Pattern: Step-through pattern;Decreased stance  time - left;Decreased stride length;Decreased dorsiflexion - left;Decreased hip/knee flexion - left;Left genu recurvatum;Lateral trunk lean to right;Poor foot clearance - left Stairs / Additional Locomotion Stairs Assistance: 5: Supervision Stair Management Technique: One rail Right;Alternating pattern;Step to pattern;Forwards Number of Stairs: 12 Height of Stairs: 7 Curb: 6: Modified independent (Device/increase time) Wheelchair  Mobility Wheelchair Mobility: No  Trunk/Postural Assessment  Cervical Assessment Cervical Assessment: Within Functional Limits Thoracic Assessment Thoracic Assessment: Within Functional Limits (decreased shortening of L trunk) Lumbar Assessment Lumbar Assessment: Within Functional Limits Postural Control Postural Control: Deficits on evaluation  Balance Balance Balance Assessed: Yes Standardized Balance Assessment Standardized Balance Assessment: Berg Balance Test Berg Balance Test Sit to Stand: Able to stand without using hands and stabilize independently Standing Unsupported: Able to stand safely 2 minutes Sitting with Back Unsupported but Feet Supported on Floor or Stool: Able to sit safely and securely 2 minutes Stand to Sit: Sits safely with minimal use of hands Transfers: Able to transfer safely, definite need of hands Standing Unsupported with Eyes Closed: Able to stand 10 seconds safely Standing Ubsupported with Feet Together: Able to place feet together independently and stand for 1 minute with supervision From Standing, Reach Forward with Outstretched Arm: Can reach confidently >25 cm (10") From Standing Position, Pick up Object from Floor: Able to pick up shoe safely and easily From Standing Position, Turn to Look Behind Over each Shoulder: Looks behind from both sides and weight shifts well Turn 360 Degrees: Able to turn 360 degrees safely but slowly Standing Unsupported, Alternately Place Feet on Step/Stool: Able to stand independently and  complete 8 steps >20 seconds Standing Unsupported, One Foot in Front: Able to plae foot ahead of the other independently and hold 30 seconds Standing on One Leg: Able to lift leg independently and hold > 10 seconds Total Score: 50 Extremity Assessment  RLE Assessment RLE Assessment: Within Functional Limits LLE Assessment LLE Assessment: Exceptions to [facility] (0/5 ankle DF, 3+/5 hip flexion, knee flexion, 4/5 knee extension)   See Function Navigator for Current Functional Status.  Raylene Everts Faucette 08/02/2016, 12:26 PM

## 2016-08-01 NOTE — Progress Notes (Signed)
Social Work Discharge Note  The overall goal for the admission was met for:   Discharge location: Yes - home with wife  Length of Stay: Yes - 17 days  Discharge activity level: Yes - modified independent with wide base quad cane  Home/community participation: Yes  Services provided included: MD, RD, PT, OT, RN, TR, Pharmacy, Neuropsych and SW  Financial Services: Private Insurance: [employer]  Follow-up services arranged: Outpatient: PT/OT, DME: wide base quad cane and Patient/Family request agency HH: [facility], DME: [facility]  Comments (or additional information):  Pt's wife to help with outpt and MD appts and some supervision tasks.  Overall, pt to be mod I while wife is at work and then they will do the tasks for which supervision is needed when she is home.  Good support and motivation.  Patient/Family verbalized understanding of follow-up arrangements: Yes  Individual responsible for coordination of the follow-up plan: pt with support from his wife  Confirmed correct DME delivered: Trey Sailors 08/01/2016    Tyra Gural, Silvestre Mesi

## 2016-08-01 NOTE — Progress Notes (Signed)
Subjective/Complaints: No issues overnite  Review of systems negative for chest pains, shortness of breath, nausea, vomiting, diarrhea, constipation, urinary or bowel incontinence.  Objective: Vital Signs: Blood pressure 115/85, pulse 68, temperature 98.2 F (36.8 C), temperature source Oral, resp. rate 18, height 6\' 4"  (1.93 m), weight 102.1 kg (225 lb), SpO2 100 %. No results found. No results found for this or any previous visit (from the past 72 hour(s)).   HEENT: normocephalic, atraumatic Cardio: RRR and No murmurs Resp: CTA B/L and Unlabored GI: BS positive and Nontender, nondistended Skin:   Facial eczema.  Neuro: Alert/Oriented, Cranial Nerve Abnormalities Left central 7 Motor LUE: 4/5 deltoid, biceps 4-/5, triceps 4-/5, finger flexion, extension are 3+/5.  LLE: Hip flexion 4/5, knee extension, 1+/5 ankle dorsiflexion  Tone:  Within Normal Limits Musc/Skel: Mild foot and ankle edema. Left  Mild tenderness in upper left trap. No obvious shoulder subluxation. PROM normal left shoulder Gen. no acute distress. Vital signs reviewed.   Assessment/Plan: 1. 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 Physiatrist is providing close team supervision and 24 hour management of active medical problems listed below. Physiatrist and rehab team continue to assess barriers to discharge/monitor patient progress toward functional and medical goals. FIM: Function - Bathing Position: Shower Body parts bathed by patient: Right arm, Left arm, Chest, Abdomen, Front perineal area, Buttocks, Right upper leg, Left upper leg, Right lower leg, Left lower leg, Back Body parts bathed by helper: Back Bathing not applicable: Back Assist Level: More than reasonable time Set up : To obtain items  Function- Upper Body  Dressing/Undressing What is the patient wearing?: Pull over shirt/dress Pull over shirt/dress - Perfomed by patient: Thread/unthread right sleeve, Thread/unthread left sleeve, Put head through opening, Pull shirt over trunk Pull over shirt/dress - Perfomed by helper: Thread/unthread left sleeve Assist Level: More than reasonable time Set up : To obtain clothing/put away Function - Lower Body Dressing/Undressing What is the patient wearing?: Underwear, Pants, Shoes, AFO, Ted Hose Position:  (recliner) Underwear - Performed by patient: Thread/unthread right underwear leg, Thread/unthread left underwear leg, Pull underwear up/down Underwear - Performed by helper: Thread/unthread left underwear leg Pants- Performed by patient: Thread/unthread right pants leg, Thread/unthread left pants leg, Pull pants up/down, Fasten/unfasten pants Pants- Performed by helper: Thread/unthread left pants leg Non-skid slipper socks- Performed by patient: Don/doff right sock, Don/doff left sock Shoes - Performed by patient: Don/doff right shoe, Don/doff left shoe Shoes - Performed by helper: Don/doff left shoe AFO - Performed by patient: Don/doff left AFO AFO - Performed by helper: Don/doff left AFO TED Hose - Performed by helper: Don/doff right TED hose, Don/doff left TED hose Assist for footwear: Setup Assist for lower body dressing: Set up  Function - Toileting Toileting steps completed by patient: Adjust clothing prior to toileting, Performs perineal hygiene, Adjust clothing after toileting Toileting Assistive Devices: Grab bar or rail Assist level: Supervision or verbal cues  Function - Air cabin crew transfer activity did not occur: N/A Toilet transfer assistive device: Cane Assist level to toilet: Supervision or verbal cues Assist level from toilet: Supervision or verbal cues  Function - Chair/bed transfer Chair/bed transfer method: Ambulatory Chair/bed transfer assist level: Supervision or  verbal cues Chair/bed transfer assistive device: Cane, Orthosis  Function - Locomotion: Wheelchair Will patient use wheelchair at discharge?: No Type: Manual Max wheelchair distance: 150 Assist  Level: Supervision or verbal cues Assist Level: Supervision or verbal cues Assist Level: Supervision or verbal cues Turns around,maneuvers to table,bed, and toilet,negotiates 3% grade,maneuvers on rugs and over doorsills: No Function - Locomotion: Ambulation Assistive device: Cane-quad, Orthosis Max distance: 500 Assist level: Supervision or verbal cues Assist level: Supervision or verbal cues Assist level: Supervision or verbal cues Walk 150 feet activity did not occur: Safety/medical concerns Assist level: Supervision or verbal cues Walk 10 feet on uneven surfaces activity did not occur: Safety/medical concerns Assist level: Supervision or verbal cues  Function - Comprehension Comprehension: Auditory Comprehension assist level: Follows complex conversation/direction with no assist  Function - Expression Expression: Verbal Expression assist level: Expresses complex ideas: With no assist  Function - Social Interaction Social Interaction assist level: Interacts appropriately with others with medication or extra time (anti-anxiety, antidepressant).  Function - Problem Solving Problem solving assist level: Solves basic 90% of the time/requires cueing < 10% of the time  Function - Memory Memory assist level: Recognizes or recalls 90% of the time/requires cueing < 10% of the time Patient normally able to recall (first 3 days only): Current season, Staff names and faces, That he or she is in a hospital, Location of own room  Medical Problem List and Plan: 1.  Left-sided weakness secondary to right posterior basal ganglia infarct on 8/20  Cont CIR PT, OT, -  AFO for LLE   - 2.  DVT Prophylaxis/Anticoagulation: Subcutaneous Lovenox. Monitor platelet counts and any signs of bleeding 3. Pain  Management/left shoulder girdle/neck pain  -No further complaints  -continue supportive measures for LUE  -k tape per therapy  -continue kpad also 4. Hypertension: Improved  Norvasc  increased to 5mg  on 8/26-Good control at present, monitor for hypotension Vitals:   07/31/16 1507 08/01/16 0538  BP: 133/83 115/85  Pulse: 68 68  Resp: 16 18  Temp: 98.1 F (36.7 C) 98.2 F (36.8 C)   5. Neuropsych: This patient is capable of making decisions on his own behalf. 6. Skin/Wound Care:  Skin intact, Foot and ankle edema. Left side related to decreased motor function, Continue elevation, retrograde massage, compression hose, discussed low-salt diet 7. Fluids/Electrolytes/Nutrition: Routine I&O's  8. GERD. Protonix- changed to pepcid,  Asymptomatic 9. Elevated lipids: apparently has an allergy to lipitor--"clots"                       -dietary choices and exercise to be reinforced, Total cholesterol 205, LDL 138, may be able to bring this down with diet only heart healthy diet, consider red yeast rice   -can explore pharmaceutical options as outpt based on follow up labs  LOS (Days) 16 A FACE TO FACE EVALUATION WAS PERFORMED   Jason Oconnor E 08/01/2016, 8:40 AM

## 2016-08-01 NOTE — Progress Notes (Signed)
Occupational Therapy Session Note  Patient Details  Name: Jason Oconnor MRN: [id number] Date of Birth: 1956/03/01  Today's Date: 08/01/2016 OT Individual Time: [id number] and 1300-1415 OT Individual Time Calculation (min): 41 min and 75 min    Short Term Goals: Week 3:  OT Short Term Goal 1 (Week 3): STG = LTGs due to remaining LOS  Skilled Therapeutic Interventions/Progress Updates:    1) Treatment session with focus on functional mobility and use of LUE during functional tasks.  Pt ambulated around room with quad cane and AFO to gather grooming items. Completed grooming tasks in standing with use of LUE as gross - diminished assist.  Ambulated to therapy gym with quad cane and supervision.  Engaged in weight shifting activity in standing with focus on weight shifting over LLE during transitional movements while reaching across midline with RUE to further facilitate weight shift.  Engaged in bending and stooping activity to retrieve items placed throughout therapy gym on floor and lower surfaces, progressed to utilizing LUE to grasp 50% of items with increased time and effort.  Engaged in Connect 4 activity in standing with focus on motor control and grasp/release with LUE, pt required increased time but able to complete task.  2) Treatment session with focus on family education with pt and pt's wife.  Ambulated to ADL apt with quad cane and AFO at supervision - Mod I level.  Educated on walk-in shower transfers with 3" ledge to simulate home shower setup with pt completing with supervision.  Discussed with wife, recommendation for her to be present during shower transfers for safety.  Retrieved items from various cabinets and refrigerator and opened containers with BUE as pt's wife with questions regarding LUE use during meal prep tasks.  Returned to room as above and engaged in bathing and dressing in room shower at overall Mod I level. LB dressing completed at sit > stand level and UB dressing and  grooming completed in standing.  Therapy Documentation Precautions:  Precautions Precautions: Fall Precaution Comments: Monitor vitals Restrictions Weight Bearing Restrictions: No Pain:  Pt with no c/o pain  See Function Navigator for Current Functional Status.   Therapy/Group: Individual Therapy  Simonne Come 08/01/2016, 12:21 PM

## 2016-08-01 NOTE — Progress Notes (Signed)
Social Work Patient ID: Jason Oconnor, male   DOB: [birth year], 60 y.o.   MRN: [id number]   CSW met with pt and later talked with pt's wife to update them on team conference discussion.  Pt is pleased he is still on track to d/c on 08-03-16, although he would have stayed longer if he needed to.  Pt is very motivated to get better and wife is a good support to him.  CSW has arranged outpt rehab to continue his progress and ordered DME.  PCP appt made for pt.  CSW's co-workers remain available to assist with any last minute concerns or questions as this CSW will be out of the hospital.

## 2016-08-01 NOTE — Patient Care Conference (Signed)
Inpatient RehabilitationTeam Conference and Plan of Care Update Date: 07/31/2016   Time: 11:00 AM    Patient Name: Jason Oconnor      Medical Record Number: [id number]  Date of Birth: [birth year] Sex: Male         Room/Bed: 4W10C/4W10C-01 Payor Info: Payor: [employer] park manager / Plan: [employer] OTHER / Product Type: *No Product type* /    Admitting Diagnosis: CVA  Admit Date/Time:  07/16/2016  2:52 PM Admission Comments: No comment available   Primary Diagnosis:  Right basal ganglia embolic stroke ([place]) Principal Problem: Right basal ganglia embolic stroke [facility])  Patient Active Problem List   Diagnosis Date Noted  . Neurocognitive disorder   . Left foot drop   . Benign essential HTN   . Hemiparesis affecting nondominant side as late effect of cerebrovascular accident (CVA) ([place]) 07/18/2016  . Right basal ganglia embolic stroke ([place]) [id number]  . CVA (cerebral infarction) 07/15/2016  . TIA (transient ischemic attack) 07/14/2016  . HTN, goal below 140/80 07/14/2016  . Left facial numbness 07/14/2016  . Weakness of left side of body 07/14/2016    Expected Discharge Date: Expected Discharge Date: 08/03/16  Team Members Present: Physician leading conference: Dr. Alysia Penna Social Worker Present: Alfonse Alpers, LCSW Nurse Present: Heather Roberts, RN PT Present: Raylene Everts, PT OT Present: Simonne Come, OT SLP Present: Windell Moulding, SLP PPS Coordinator present : Daiva Nakayama, RN, CRRN     Current Status/Progress Goal Weekly Team Focus  Medical   BP controlled did well on outing safety awareness improving  home d/c with OP f/u  d/c planning   Bowel/Bladder   cont x2 LBM: 07/30/16- loose stools x2 . refusing colace  cont x2   continue with plan of care   Swallow/Nutrition/ Hydration             ADL's   supervision overall with quad cane  Mod I  ADL retraining, LUE NMR, dynamic standing balance, bathroom transfers   Mobility   Supervision-min A  Mod I  L NMR, transition to  cane for gait, dynamic standing balance and D/C planning   Communication             Safety/Cognition/ Behavioral Observations            Pain   KPAD, and tylenol PRN. occassional headache but doesn't want to take any medication   <2  assess and treat qshift and PRN    Skin   C,D,I  no further skin breakdown while on rehab   monitor for any breakdown     Rehab Goals Patient on target to meet rehab goals: Yes Rehab Goals Revised: none *See Care Plan and progress notes for long and short-term goals.  Barriers to Discharge: no family training thus far    Possible Resolutions to Barriers:  cont rehab    Discharge Planning/Teaching Needs:  Pt plans to return to his home with his wife who will provide intermittent supervision, as needed.  Wife coming for family education on 08-01-16.   Team Discussion:  Pt is doing well medically and is making good progress.  Pt has trouble seeing his own progress.  Pt will not need OT DME, as he will be borrowing his daughter's shower seat.  Pt went on an outing and did well.  His awareness is better.  Pt has and is using his own AFO.  Revisions to Treatment Plan:  none   Continued Need for Acute Rehabilitation Level of Care: The patient requires  daily medical management by a physician with specialized training in physical medicine and rehabilitation for the following conditions: Daily direction of a multidisciplinary physical rehabilitation program to ensure safe treatment while eliciting the highest outcome that is of practical value to the patient.: Yes Daily medical management of patient stability for increased activity during participation in an intensive rehabilitation regime.: Yes Daily analysis of laboratory values and/or radiology reports with any subsequent need for medication adjustment of medical intervention for : Neurological problems  Jason Oconnor, Silvestre Mesi 08/01/2016, 11:54 AM

## 2016-08-02 ENCOUNTER — Inpatient Hospital Stay (HOSPITAL_COMMUNITY): Payer: 59 | Admitting: Physical Therapy

## 2016-08-02 ENCOUNTER — Inpatient Hospital Stay (HOSPITAL_COMMUNITY): Payer: 59 | Admitting: Occupational Therapy

## 2016-08-02 MED ORDER — CLOPIDOGREL BISULFATE 75 MG PO TABS: 75.0000 mg | ORAL_TABLET | Freq: Every day | ORAL | 0 refills | Status: DC

## 2016-08-02 MED ORDER — CLOPIDOGREL BISULFATE 75 MG PO TABS: 75.0000 mg | ORAL_TABLET | Freq: Every day | ORAL | 0 refills | Status: AC

## 2016-08-02 MED ORDER — OMEPRAZOLE 20 MG PO CPDR: 20.0000 mg | DELAYED_RELEASE_CAPSULE | Freq: Every day | ORAL | 0 refills | Status: DC | PRN

## 2016-08-02 MED ORDER — AMLODIPINE BESYLATE 5 MG PO TABS: 5.0000 mg | ORAL_TABLET | Freq: Every evening | ORAL | 0 refills | Status: AC

## 2016-08-02 MED ORDER — PANTOPRAZOLE SODIUM 40 MG PO TBEC: 40.0000 mg | DELAYED_RELEASE_TABLET | Freq: Every day | ORAL | 0 refills | Status: DC

## 2016-08-02 MED ORDER — AMLODIPINE BESYLATE 5 MG PO TABS: 5.0000 mg | ORAL_TABLET | Freq: Every evening | ORAL | 0 refills | Status: DC

## 2016-08-02 MED ORDER — AMLODIPINE BESYLATE 5 MG PO TABS
5.0000 mg | ORAL_TABLET | Freq: Every evening | ORAL | 1 refills | Status: DC
Start: 2016-08-02 — End: 2016-08-02

## 2016-08-02 MED ORDER — DOCUSATE SODIUM 100 MG PO CAPS: 100.0000 mg | ORAL_CAPSULE | Freq: Every day | ORAL | 0 refills | Status: DC

## 2016-08-02 NOTE — Discharge Summary (Signed)
NAMEMarland Kitchen  MERRITT, ACHEE NO.:  [id number]  MEDICAL RECORD NO.:  [id number]  LOCATION:  4W10C                        FACILITY:  [facility]  PHYSICIAN:  Charlett Blake, M.D.DATE OF BIRTH:  December 03, 1955  DATE OF ADMISSION:  07/15/2016 DATE OF DISCHARGE:  08/03/2016                              DISCHARGE SUMMARY   DISCHARGE DIAGNOSES: 1. Right posterior basal ganglia infarct. 2. Subcutaneous Lovenox for deep venous thrombosis prophylaxis. 3. Pain management. 4. Hypertension. 5. Gastroesophageal reflux disease. 6. Hyperlipidemia.  HISTORY OF PRESENT ILLNESS:  This is a 60 year old right-handed male with history of hypertension, married independent prior to admission. Presented to [facility] on July 14, 2016, with left-sided weakness and facial droop.  Blood pressure elevated 170/106. Cranial CT scan negative.  MRI of the brain showed acute infarct posterior basal ganglia on the right involving the posterior putamen. CTA of the head showed no emergent large vessel occlusion or stenosis. Carotid Dopplers with no ICA stenosis.  The patient did not receive tPA. Echocardiogram with ejection fraction of [id number], normal systolic function. Initially, placed on aspirin and Plavix for CVA prophylaxis, simplified to Plavix.  Subcutaneous Lovenox for DVT prophylaxis.  Physical and occupational therapy ongoing.  The patient was admitted for comprehensive rehab program.  PAST MEDICAL HISTORY:  See discharge diagnoses.  SOCIAL HISTORY:  Independent prior to admission and married.  Functional status upon admission to rehab services was moderate assist, 3 feet rolling walker; minimal assist, sit to stand; min to mod assist, activities of daily living.  PHYSICAL EXAMINATION:  VITAL SIGNS:  Blood pressure 142/89, pulse 78, respirations 18, temperature 98. GENERAL:  This was an alert male, oriented x3.  Mild left facial droop. LUNGS:  Clear to auscultation  without wheeze. CARDIAC:  Regular rate and rhythm without murmur. ABDOMEN:  Soft, nontender.  Good bowel sounds.  REHABILITATION HOSPITAL COURSE:  The patient was admitted to inpatient rehab services with therapies initiated on a 3-hour daily basis, consisting of physical therapy, occupational therapy, and rehabilitation nursing.  The following issues were addressed during patient's rehabilitation stay.  Pertaining to Mr. Felling's right posterior basal ganglia infarct remained stable, maintained on Plavix therapy. Subcutaneous Lovenox for DVT prophylaxis.  No bleeding episodes.  Blood pressures controlled on Norvasc.  He would follow up with his primary MD.  Elevated lipids apparently reported. Allergies to LIPITOR in the past.  Dietary choices an exercise were reinforced.  Total cholesterol 205, LDL 138.  It was felt he might be able to bring this down with diet only heart-healthy, consider red yeast rice, can explore pharmaceutical options as an outpatient with primary MD.  The patient received weekly collaborative interdisciplinary team conferences to discuss estimated length of stay, family teaching, any barriers to discharge.  The patient ambulated throughout the unit controlled environment with a wide base quad cane supervision.  Obstacle negotiation with stepping over obstacles and supervision.  Squat and reach activities with supervision.  He could gather his belongings for activities of daily living and homemaking.  He was fitted with an AFO brace.  Educated on walk-in shower transfers.  Full family teaching was completed and plan discharge to home.  DISCHARGE  MEDICATIONS:  Included: 1. Vitamin D 1000 units daily. 2. Plavix 75 mg p.o. daily. 3. Protonix 40 mg p.o. daily. 4. Colace 100 mg p.o. daily. 5. Norvasc 5 mg p.o. daily.  DIET:  The patient's diet was regular.  SPECIAL INSTRUCTIONS:  Patient would follow up with Dr. Alysia Penna at the [facility] as  advised; Leotis Pain, MD, Neurology Services; Dr. Richardo Priest, PCP, on August 06, 2016. Patient advised no driving.  Follow up with PCP on options for hyperlipidemia due to reported LIPITOR allergy.     Lauraine Rinne, P.A.   ______________________________ Charlett Blake, M.D.    DA/MEDQ  D:  08/02/2016  T:  08/02/2016  Job:  [id number]  cc:   Leotis Pain, MD Dr. Richardo Priest

## 2016-08-02 NOTE — Progress Notes (Signed)
Physical Therapy Session Note  Patient Details  Name: Jason Oconnor MRN: [id number] Date of Birth: [birth year]  Today's Date: 08/02/2016 PT Individual Time: 0834-0900 PT Individual Time Calculation (min): 26 min    Short Term Goals: Week 2:  PT Short Term Goal 1 (Week 2): = LTG of Mod I overall for D/C 08/03/16  Skilled Therapeutic Interventions/Progress Updates:    Discussed with primary PT and patient made mod I in room using quad cane. Donned TED hose with assistance and patient donned shoes and AFO with setup assist from recliner. Gait training using quad cane room <> day room on tiled and carpeted surfaces with mod I and increased time. Patient demonstrates moderate fall risk as noted by score of 50/56 on Berg Balance Scale, see details below. Patient left in room with all needs in reach and expressed feeling ready and excited for DC home planned tomorrow.   Therapy Documentation Precautions:  Precautions Precautions: Fall Precaution Comments: Monitor vitals Restrictions Weight Bearing Restrictions: No Pain: Pain Assessment Pain Assessment: Faces Faces Pain Scale: Hurts a little bit Pain Type: Acute pain Pain Location: Back Pain Orientation: Lower Pain Descriptors / Indicators: Pressure (Stiffness) Pain Onset: On-going Pain Intervention(s): Ambulation/increased activity Balance: Balance Balance Assessed: Yes Standardized Balance Assessment Standardized Balance Assessment: Berg Balance Test Berg Balance Test Sit to Stand: Able to stand without using hands and stabilize independently Standing Unsupported: Able to stand safely 2 minutes Sitting with Back Unsupported but Feet Supported on Floor or Stool: Able to sit safely and securely 2 minutes Stand to Sit: Sits safely with minimal use of hands Transfers: Able to transfer safely, definite need of hands Standing Unsupported with Eyes Closed: Able to stand 10 seconds safely Standing Ubsupported with Feet Together: Able to place  feet together independently and stand for 1 minute with supervision From Standing, Reach Forward with Outstretched Arm: Can reach confidently >25 cm (10") From Standing Position, Pick up Object from Floor: Able to pick up shoe safely and easily From Standing Position, Turn to Look Behind Over each Shoulder: Looks behind from both sides and weight shifts well Turn 360 Degrees: Able to turn 360 degrees safely but slowly Standing Unsupported, Alternately Place Feet on Step/Stool: Able to stand independently and complete 8 steps >20 seconds Standing Unsupported, One Foot in Front: Able to plae foot ahead of the other independently and hold 30 seconds Standing on One Leg: Able to lift leg independently and hold > 10 seconds Total Score: 50/56  See Function Navigator for Current Functional Status.   Therapy/Group: Individual Therapy  Laretta Alstrom 08/02/2016, 9:00 AM

## 2016-08-02 NOTE — Discharge Summary (Signed)
Discharge summary job [id number]

## 2016-08-02 NOTE — Progress Notes (Signed)
Occupational Therapy Discharge Summary  Patient Details  Name: Jason Oconnor MRN: [id number] Date of Birth: 1956/03/11  Today's Date: 08/02/2016 OT Individual Time: 1105-1200 OT Individual Time Calculation (min): 55 min   Session Note:  Pt completed bathing, dressing, and grooming during session.  He completed all bathing sit to stand in the shower.  Pt reports that he will start with a fixed shower head but can purchase a hand held if needed.  He was able to use the LUE as an active assist for bathing tasks with increased time.  He completed all of dressing except for donning TEDs with modified independence as well.  All transfers completed with use of the quad cane.  Pt left in room in bedside chair with call button in reach.     Patient has met 11 of 11 long term goals due to improved balance, postural control, ability to compensate for deficits, functional use of  LEFT upper and LEFT lower extremity and improved coordination.  Patient to discharge at overall Modified Independent level.  Patient's care partner is independent to provide the necessary physical and cognitive assistance at discharge.    Reasons goals not met: NA  Recommendation:  Patient will benefit from ongoing skilled OT services in outpatient setting to continue to advance functional skills in the area of BADL.  Pt still exhibits decreased dynamic balance and decreased LUE functional use, coordination, and strength.  Feel he will need further neuromuscular re-education in order to increase independence with selfcare tasks.    Equipment: No equipment provided  Reasons for discharge: treatment goals met and discharge from hospital  Patient/family agrees with progress made and goals achieved: Yes  OT Discharge Precautions/Restrictions Precautions Precautions: Fall Precaution Comments: Monitor vitals Restrictions Weight Bearing Restrictions: No   Pain Pain Assessment Pain Assessment: No/denies pain Faces Pain Scale:  Hurts a little bit Pain Type: Acute pain Pain Location: Back Pain Orientation: Lower Pain Descriptors / Indicators: Pressure (Stiffness) Pain Onset: On-going Pain Intervention(s): Ambulation/increased activity ADL  See Function Section of chart  Vision/Perception  Vision- History Baseline Vision/History: Wears glasses Wears Glasses: At all times Patient Visual Report: No change from baseline Vision- Assessment Vision Assessment?: No apparent visual deficits  Cognition Overall Cognitive Status: Within Functional Limits for tasks assessed Arousal/Alertness: Awake/alert Orientation Level: Oriented X4 Memory: Appears intact Awareness: Appears intact Problem Solving: Appears intact Safety/Judgment: Appears intact Sensation Sensation Light Touch: Appears Intact Stereognosis: Not tested Hot/Cold: Not tested Proprioception: Appears Intact Coordination Gross Motor Movements are Fluid and Coordinated: Yes Fine Motor Movements are Fluid and Coordinated: No Coordination and Movement Description: Brunnstrum stage V in the left arm and hand.  Uses as an active assist for bathing with increased time.  Unable to pull up pants with the LUE at this time.   Motor  Motor Motor: Hemiplegia;Motor impersistence Mobility  Bed Mobility Bed Mobility: Sit to Supine;Supine to Sit Supine to Sit: 6: Modified independent (Device/Increase time) Sit to Supine: 6: Modified independent (Device/Increase time) Transfers Transfers: Sit to Stand;Stand to Sit Sit to Stand: 6: Modified independent (Device/Increase time);With upper extremity assist;With armrests;From toilet Stand to Sit: 6: Modified independent (Device/Increase time);With upper extremity assist;With armrests;To toilet  Trunk/Postural Assessment  Cervical Assessment Cervical Assessment: Within Functional Limits Thoracic Assessment Thoracic Assessment: Within Functional Limits Lumbar Assessment Lumbar Assessment: Within Functional  Limits Postural Control Postural Control: Deficits on evaluation  Balance Balance Balance Assessed: Yes Standardized Balance Assessment Standardized Balance Assessment: Berg Balance Test Berg Balance Test Sit to Stand: Able to  stand without using hands and stabilize independently Standing Unsupported: Able to stand safely 2 minutes Sitting with Back Unsupported but Feet Supported on Floor or Stool: Able to sit safely and securely 2 minutes Stand to Sit: Sits safely with minimal use of hands Transfers: Able to transfer safely, definite need of hands Standing Unsupported with Eyes Closed: Able to stand 10 seconds safely Standing Ubsupported with Feet Together: Able to place feet together independently and stand for 1 minute with supervision From Standing, Reach Forward with Outstretched Arm: Can reach confidently >25 cm (10") From Standing Position, Pick up Object from Floor: Able to pick up shoe safely and easily From Standing Position, Turn to Look Behind Over each Shoulder: Looks behind from both sides and weight shifts well Turn 360 Degrees: Able to turn 360 degrees safely but slowly Standing Unsupported, Alternately Place Feet on Step/Stool: Able to stand independently and complete 8 steps >20 seconds Standing Unsupported, One Foot in Front: Able to plae foot ahead of the other independently and hold 30 seconds Standing on One Leg: Able to lift leg independently and hold > 10 seconds Total Score: 50 Static Sitting Balance Static Sitting - Level of Assistance: 6: Modified independent (Device/Increase time) Dynamic Sitting Balance Dynamic Sitting - Level of Assistance: 6: Modified independent (Device/Increase time) Static Standing Balance Static Standing - Level of Assistance: 6: Modified independent (Device/Increase time) Dynamic Standing Balance Dynamic Standing - Balance Support: Right upper extremity supported Dynamic Standing - Level of Assistance: 6: Modified independent  (Device/Increase time) Extremity/Trunk Assessment RUE Assessment RUE Assessment: Within Functional Limits LUE Assessment LUE Assessment: Exceptions to WFL LUE PROM (degrees) LUE Overall PROM Comments: Pt with Brunnstrum stage V in the hand and arm.  Still exhibits slight synergy pattern with shoulder flexion above 70 degrees but has some isolated movement in the wrist and digits.  Able to oppose thumb to index finger but not other digits.     See Function Navigator for Current Functional Status.  Jason Oconnor 08/02/2016, 12:40 PM

## 2016-08-02 NOTE — Progress Notes (Signed)
Subjective/Complaints: Patient is looking forward to discharge  Review of systems negative for chest pains, shortness of breath, nausea, vomiting, diarrhea, constipation, urinary or bowel incontinence.  Objective: Vital Signs: Blood pressure 117/76, pulse 61, temperature 97.9 F (36.6 C), temperature source Oral, resp. rate 18, height 6\' 4"  (1.93 m), weight 102.1 kg (225 lb), SpO2 98 %. No results found. No results found for this or any previous visit (from the past 72 hour(s)).   HEENT: normocephalic, atraumatic Cardio: RRR and No murmurs Resp: CTA B/L and Unlabored GI: BS positive and Nontender, nondistended Skin:   Facial eczema.  Neuro: Alert/Oriented, Cranial Nerve Abnormalities Left central 7 Motor LUE: 4/5 deltoid, biceps 4-/5, triceps 4-/5, finger flexion, extension are 3+/5.  LLE: Hip flexion 4/5, knee extension, 1+/5 ankle dorsiflexion  Tone:  Within Normal Limits Musc/Skel: Mild foot and ankle edema. Left  Mild tenderness in upper left trap. No obvious shoulder subluxation. PROM normal left shoulder Gen. no acute distress. Vital signs reviewed.   Assessment/Plan: 1. 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 Physiatrist is providing close team supervision and 24 hour management of active medical problems listed below. Physiatrist and rehab team continue to assess barriers to discharge/monitor patient progress toward functional and medical goals. FIM: Function - Bathing Position: Shower Body parts bathed by patient: Right arm, Left arm, Chest, Abdomen, Front perineal area, Buttocks, Right upper leg, Left upper leg, Right lower leg, Left lower leg, Back Body parts bathed by helper: Back Bathing not applicable: Back Assist Level: More than reasonable time Set up : To obtain  items  Function- Upper Body Dressing/Undressing What is the patient wearing?: Pull over shirt/dress Pull over shirt/dress - Perfomed by patient: Thread/unthread right sleeve, Thread/unthread left sleeve, Put head through opening, Pull shirt over trunk Pull over shirt/dress - Perfomed by helper: Thread/unthread left sleeve Assist Level: More than reasonable time Set up : To obtain clothing/put away Function - Lower Body Dressing/Undressing What is the patient wearing?: Underwear, Pants, Shoes, AFO, Ted Hose Position:  (recliner) Underwear - Performed by patient: Thread/unthread right underwear leg, Thread/unthread left underwear leg, Pull underwear up/down Underwear - Performed by helper: Thread/unthread left underwear leg Pants- Performed by patient: Thread/unthread right pants leg, Thread/unthread left pants leg, Pull pants up/down, Fasten/unfasten pants Pants- Performed by helper: Thread/unthread left pants leg Non-skid slipper socks- Performed by patient: Don/doff right sock, Don/doff left sock Shoes - Performed by patient: Don/doff right shoe, Don/doff left shoe Shoes - Performed by helper: Don/doff left shoe AFO - Performed by patient: Don/doff left AFO AFO - Performed by helper: Don/doff left AFO TED Hose - Performed by helper: Don/doff right TED hose, Don/doff left TED hose Assist for footwear: Setup Assist for lower body dressing: More than reasonable time  Function - Toileting Toileting steps completed by patient: Adjust clothing prior to toileting, Performs perineal hygiene, Adjust clothing after toileting Toileting Assistive Devices: Grab bar or rail Assist level: More than reasonable time  Function - Air cabin crew transfer activity did not occur: N/A Toilet transfer assistive device: Cane Assist level to toilet: No Help, no cues, assistive device, takes more than a reasonable amount of time Assist level from toilet: No Help, no cues, assistive device, takes more  than a reasonable amount of time  Function - Chair/bed transfer Chair/bed transfer method: Ambulatory Chair/bed transfer assist level: Supervision or verbal cues  Chair/bed transfer assistive device: Cane, Orthosis  Function - Locomotion: Wheelchair Will patient use wheelchair at discharge?: No Type: Manual Max wheelchair distance: 150 Assist Level: Supervision or verbal cues Assist Level: Supervision or verbal cues Assist Level: Supervision or verbal cues Turns around,maneuvers to table,bed, and toilet,negotiates 3% grade,maneuvers on rugs and over doorsills: No Function - Locomotion: Ambulation Assistive device: Cane-quad, Orthosis Max distance: 500 Assist level: Supervision or verbal cues Assist level: Supervision or verbal cues Assist level: Supervision or verbal cues Walk 150 feet activity did not occur: Safety/medical concerns Assist level: Supervision or verbal cues Walk 10 feet on uneven surfaces activity did not occur: Safety/medical concerns Assist level: Supervision or verbal cues  Function - Comprehension Comprehension: Auditory Comprehension assist level: Follows complex conversation/direction with no assist  Function - Expression Expression: Verbal Expression assist level: Expresses complex ideas: With no assist  Function - Social Interaction Social Interaction assist level: Interacts appropriately with others with medication or extra time (anti-anxiety, antidepressant).  Function - Problem Solving Problem solving assist level: Solves basic 90% of the time/requires cueing < 10% of the time  Function - Memory Memory assist level: Recognizes or recalls 90% of the time/requires cueing < 10% of the time Patient normally able to recall (first 3 days only): Current season, Staff names and faces, That he or she is in a hospital, Location of own room  Medical Problem List and Plan: 1.  Left-sided weakness secondary to right posterior basal ganglia infarct on  8/20  Cont CIR PT, OT, - plan discharge in a.m., 9/9  - 2.  DVT Prophylaxis/Anticoagulation: Subcutaneous Lovenox. Monitor platelet counts and any signs of bleeding 3. Pain Management/left shoulder girdle/neck pain  -No further complaints  -continue supportive measures for LUE  -k tape per therapy  -continue kpad also 4. Hypertension: Improved  Norvasc  increased to 5mg  on 8/26-Good control at present, monitor for hypotension Vitals:   08/01/16 1424 08/02/16 0543  BP: 132/84 117/76  Pulse: 68 61  Resp: 18 18  Temp: 98.2 F (36.8 C) 97.9 F (36.6 C)   5. Neuropsych: This patient is capable of making decisions on his own behalf. 6. Skin/Wound Care:  Skin intact, Foot and ankle edema. Left side related to decreased motor function, Continue elevation, retrograde massage, compression hose, discussed low-salt diet 7. Fluids/Electrolytes/Nutrition: Routine I&O's  8. GERD. Protonix- changed to pepcid,  Asymptomatic 9. Elevated lipids: apparently has an allergy to lipitor--"clots"                       -dietary choices and exercise to be reinforced, Total cholesterol 205, LDL 138, may be able to bring this down with diet only heart healthy diet, consider red yeast rice   -can explore pharmaceutical options as outpt based on follow up labs  LOS (Days) 17 A FACE TO FACE EVALUATION WAS PERFORMED   KIRSTEINS,ANDREW E 08/02/2016, 9:00 AM

## 2016-08-03 NOTE — Progress Notes (Signed)
08/03/16 [id number] nursing Patient discharged to home per wheelchair accompanied by NT and wife. Discharge instructions done yesterday per patient no further questions.

## 2016-08-03 NOTE — Progress Notes (Signed)
Subjective/Complaints: Pt sitting up in his recliner.  He states he is doing well because he is going home.  His wife later asks about recommendations regarding PRAFO.    Review of systems: Denies CP, SOB, N/V/D.  Objective: Vital Signs: Blood pressure 123/80, pulse 61, temperature 97.9 F (36.6 C), temperature source Oral, resp. rate 18, height 6\' 4"  (1.93 m), weight 102.1 kg (225 lb), SpO2 98 %. No results found. No results found for this or any previous visit (from the past 72 hour(s)).   HEENT: normocephalic, atraumatic Cardio: RRR and No murmurs Resp: CTA B/L and Unlabored GI: BS positive and Nontender, nondistended Skin: Warm and dry. Facial eczema  Neuro: Alert/Oriented, Cranial Nerve Abnormalities Left central 7 Motor LUE: 4/5 deltoid, biceps 4-/5, triceps 4-/5, finger flexion, extension are 3+/5.  LLE: Hip flexion 4/5, knee extension, 2/5 ankle dorsiflexion  Tone:  Within Normal Limits Musc/Skel: Mild foot and ankle edema. No tenderness. Gen. no acute distress. Vital signs reviewed.   Assessment/Plan: 1. 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 Physiatrist is providing close team supervision and 24 hour management of active medical problems listed below. Physiatrist and rehab team continue to assess barriers to discharge/monitor patient progress toward functional and medical goals. FIM: Function - Bathing Position: Shower Body parts bathed by patient: Right arm, Left arm, Chest, Abdomen, Front perineal area, Buttocks, Right upper leg, Left upper leg, Right lower leg, Left lower leg, Back Body parts bathed by helper: Back Bathing not applicable: Back Assist Level: More than reasonable time Set up : To obtain items  Function- Upper Body Dressing/Undressing What is the patient wearing?:  Pull over shirt/dress Pull over shirt/dress - Perfomed by patient: Thread/unthread right sleeve, Thread/unthread left sleeve, Put head through opening, Pull shirt over trunk Pull over shirt/dress - Perfomed by helper: Thread/unthread left sleeve Assist Level: More than reasonable time Set up : To obtain clothing/put away Function - Lower Body Dressing/Undressing What is the patient wearing?: Underwear, Pants, Shoes, AFO, Ted Hose Position:  (recliner) Underwear - Performed by patient: Thread/unthread right underwear leg, Thread/unthread left underwear leg, Pull underwear up/down Underwear - Performed by helper: Thread/unthread left underwear leg Pants- Performed by patient: Thread/unthread right pants leg, Thread/unthread left pants leg, Pull pants up/down, Fasten/unfasten pants Pants- Performed by helper: Thread/unthread left pants leg Non-skid slipper socks- Performed by patient: Don/doff right sock, Don/doff left sock Shoes - Performed by patient: Don/doff right shoe, Don/doff left shoe Shoes - Performed by helper: Don/doff right shoe, Don/doff left shoe AFO - Performed by patient: Don/doff left AFO AFO - Performed by helper: Don/doff left AFO TED Hose - Performed by helper: Don/doff right TED hose, Don/doff left TED hose Assist for footwear: Setup Assist for lower body dressing: Supervision or verbal cues  Function - Toileting Toileting steps completed by patient: Adjust clothing prior to toileting, Performs perineal hygiene, Adjust clothing after toileting Toileting Assistive Devices: Grab bar or rail Assist level: More than reasonable time  Function - Air cabin crew transfer activity did not occur: N/A Toilet transfer assistive device: Cane Assist level to toilet: No Help, no cues, assistive device, takes more than a reasonable amount of time Assist level from toilet: No Help, no cues, assistive device, takes more than a reasonable amount of time  Function - Chair/bed  transfer Chair/bed transfer method: Ambulatory Chair/bed transfer assist level: No Help,  no cues, assistive device, takes more than a reasonable amount of time Chair/bed transfer assistive device: Cane, Orthosis  Function - Locomotion: Wheelchair Will patient use wheelchair at discharge?: No Type: Manual Wheelchair activity did not occur: N/A Max wheelchair distance: 150 Assist Level: Supervision or verbal cues Wheel 50 feet with 2 turns activity did not occur: N/A Assist Level: Supervision or verbal cues Wheel 150 feet activity did not occur: N/A Assist Level: Supervision or verbal cues Turns around,maneuvers to table,bed, and toilet,negotiates 3% grade,maneuvers on rugs and over doorsills: No Function - Locomotion: Ambulation Assistive device: Cane-quad, Orthosis Max distance: 150 Assist level: No help, No cues, assistive device, takes more than a reasonable amount of time Assist level: No help, No cues, assistive device, takes more than a reasonable amount of time Assist level: No help, No cues, assistive device, takes more than a reasonable amount of time Walk 150 feet activity did not occur: Safety/medical concerns Assist level: No help, No cues, assistive device, takes more than a reasonable amount of time Walk 10 feet on uneven surfaces activity did not occur: Safety/medical concerns Assist level: No help, No cues, assistive device, takes more than a reasonable amount of time  Function - Comprehension Comprehension: Auditory Comprehension assist level: Follows complex conversation/direction with no assist  Function - Expression Expression: Verbal Expression assist level: Expresses complex ideas: With no assist  Function - Social Interaction Social Interaction assist level: Interacts appropriately with others with medication or extra time (anti-anxiety, antidepressant).  Function - Problem Solving Problem solving assist level: Solves basic 90% of the time/requires cueing  < 10% of the time  Function - Memory Memory assist level: Recognizes or recalls 90% of the time/requires cueing < 10% of the time Patient normally able to recall (first 3 days only): Current season, Staff names and faces, That he or she is in a hospital, Location of own room  Medical Problem List and Plan: 1.  Left-sided weakness secondary to right posterior basal ganglia infarct on 8/20  D/c today 2.  DVT Prophylaxis/Anticoagulation: Subcutaneous Lovenox. Monitor platelet counts and any signs of bleeding 3. Pain Management/left shoulder girdle/neck pain  -No further complaints  -continue supportive measures for LUE  -k tape per therapy  -continue kpad also 4. Hypertension: Improved  Norvasc  increased to 5mg  on 8/26  Relatively well controlled Vitals:   08/02/16 1706 08/03/16 0610  BP: (!) 131/91 123/80  Pulse: 72 61  Resp: 18 18  Temp:  97.9 F (36.6 C)   5. Neuropsych: This patient is capable of making decisions on his own behalf. 6. Skin/Wound Care:  Skin intact, Foot and ankle edema. Left side related to decreased motor function, Continue elevation, retrograde massage, compression hose, discussed low-salt diet 7. Fluids/Electrolytes/Nutrition: Routine I&O's  8. GERD. Protonix- changed to pepcid,  Asymptomatic 9. Elevated lipids: apparently has an allergy to lipitor--"clots"    -dietary choices and exercise to be reinforced, Total cholesterol 205, LDL 138, may be able to bring this down with diet only heart healthy diet, consider red yeast rice  -can explore pharmaceutical options as outpt based on follow up labs  LOS (Days) 18 A FACE TO FACE EVALUATION WAS PERFORMED   Ankit Lorie Phenix 08/03/2016, 10:48 AM

## 2016-08-03 NOTE — Progress Notes (Signed)
Physical Therapy Session Note  Patient Details  Name: Jason Oconnor MRN: [id number] Date of Birth: 01/22/1956  Today's Date: 08/02/2016 PT Individual Time:  1540-416 Calculated PT individulal time: 47    Short Term Goals: Week 2:  PT Short Term Goal 1 (Week 2): = LTG of Mod I overall for D/C 08/03/16  Skilled Therapeutic Interventions/Progress Updates:     Patient received sitting in recliner and agreeable to PT.   PT instructed patient in gait training in hall for 165f x 2 with supervision A and min cues for improved foot clearance to improve safety of gait and normalize gait pattern.   Neuromuscular re-ed with dynamic balance training on Wii fit for weight shifting in all planes with visual feed back. PT provided min A for increased safety and to improved weight shift over LLE. Following cues from PT, patient demonstrated improved L knee stability and increased weight shifting to the L to allow greater success on table tilt and penguing slide games.   Throughout treatment, patient performed sit<>[facility]   Paitent left sitting in room with wife present and all needs met.   Therapy Documentation Precautions:  Precautions Precautions: Fall Precaution Comments: Monitor vitals Restrictions Weight Bearing Restrictions: No   See Function Navigator for Current Functional Status.   Therapy/Group: Individual Therapy  ALorie Phenix9/07/2016, 4:37 AM

## 2016-08-07 ENCOUNTER — Ambulatory Visit: Payer: 59 | Admitting: Occupational Therapy

## 2016-08-07 ENCOUNTER — Encounter: Payer: Self-pay | Admitting: Occupational Therapy

## 2016-08-07 ENCOUNTER — Ambulatory Visit: Payer: 59 | Attending: Physical Medicine & Rehabilitation

## 2016-08-07 VITALS — BP 139/95 | HR 85

## 2016-08-07 DIAGNOSIS — R2681 Unsteadiness on feet: Secondary | ICD-10-CM | POA: Insufficient documentation

## 2016-08-07 DIAGNOSIS — M6281 Muscle weakness (generalized): Secondary | ICD-10-CM

## 2016-08-07 DIAGNOSIS — R278 Other lack of coordination: Secondary | ICD-10-CM | POA: Insufficient documentation

## 2016-08-07 DIAGNOSIS — R2689 Other abnormalities of gait and mobility: Secondary | ICD-10-CM | POA: Diagnosis present

## 2016-08-07 NOTE — Patient Instructions (Addendum)
SIT TO STAND: Feet Apart    Place feet apart. Lean chest forward. Raise hips and straighten knees to stand. Do 10 times, rest repeat 2 more sets.  _2__ times/day,   HIP: Abduction / External Rotation (Band)    Place band around knees. Lie on right side with hips and knees bent. Raise top knee up, squeezing glutes. Keep feet together. Hold _3__ seconds. Use ___red_____ band. Do 10 times, rest, repeat 2 more sets.  _2__ times/day,    Adduction: Hip - Knees Together (Sitting)    Sit with towel roll between knees. Push knees together. Hold _3__ seconds. Do 10 times, rest, repeat 2 more sets.  _2__ times/day,

## 2016-08-08 ENCOUNTER — Ambulatory Visit: Payer: 59

## 2016-08-08 NOTE — Therapy (Signed)
[employer] MAIN [facility] SERVICES [address] [place], [place], [address] Phone: [phone]   Fax:  [phone]  Physical Therapy Evaluation  Patient Details  Name: Jason Oconnor MRN: [id number] Date of Birth: Apr 14, 1956 Referring Provider: Dr. Naaman Plummer  Encounter Date: 08/07/2016      PT End of Session - 08/08/16 1426    Visit Number 1   Number of Visits 17   Date for PT Re-Evaluation 10/02/16   Authorization Type no g codes   PT Start Time 1505   PT Stop Time 1600   PT Time Calculation (min) 55 min   Equipment Utilized During Treatment Gait belt   Activity Tolerance Patient tolerated treatment well   Behavior During Therapy [facility] for tasks assessed/performed      Past Medical History:  Diagnosis Date  . Bone marrow disease   . Hypertension     History reviewed. No pertinent surgical history.  Vitals:   08/07/16 1520  BP: (!) 139/95  Pulse: 85  SpO2: 98%         Subjective Assessment - 08/07/16 1518    Subjective CVA   Pertinent History Patient is a 60 year old right-handed male with history of hypertension. Per chart review patient is married. 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 Presented to [facility] 07/14/2016 with left-sided weakness and facial droop. Blood pressure elevated 170/106. Head CT scan negative. 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 CTA of the head showed no emergent large vessel occlusion or severe stenosis. Carotid Dopplers with no ICA stenosis. Patient did not receive TPA. Echocardiogram with ejection fraction of 60%. Normal systolic function. Placed on aspirin and Plavix for CVA prophylaxis. Subcutaneous Lovenox for DVT prophylaxis.  Patient transferred to CIR on 07/16/2016. Pt discharged form CIR on 08/03/16. Pt reports that he  made progress at CIR. No reported problems since returning home. Currently using a wide base quad cane in RUE and L AFO. Pt reports that he has made the last progress with respect to his LUE strength.    Diagnostic tests See history   Patient Stated Goals "Get back as close as I can to normal." Pt would to be able to play golf. Pt would like to return to some kind of work.    Currently in Pain? No/denies            [facility] PT Assessment - 08/07/16 1519      Assessment   Medical Diagnosis CVA   Referring Provider Dr. Naaman Plummer   Onset Date/Surgical Date 07/14/16   Hand Dominance Right   Next MD Visit Next week with Dr. Letta Pate   Prior Therapy Yes, at [facility]     Precautions   Precautions Fall     Restrictions   Weight Bearing Restrictions No     Balance Screen   Has the patient fallen in the past 6 months Yes   How many times? 1   Has the patient had a decrease in activity level because of a fear of falling?  Yes   Is the patient reluctant to leave their home because of a fear of falling?  No     Home Social worker Private residence   Living Arrangements Spouse/significant other   Available Help at Discharge Family   Type of Home Other(Comment)  [place]  Stairs to enter   [employer] of Steps 1   Home Layout One level   [place] - quad;Shower seat     Prior Function   Level of Independence Independent with basic ADLs   Vocation Full time employment   Vocation Requirements Currently off work. 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 Mildly diminished in LLE per subjective reports but appears grossly intact to light touch sensation testing.      ROM / Strength   AROM / PROM /  Strength AROM;Strength     AROM   Overall AROM Comments Limited L shoulder flexion and abduction AROM and PROM. PROM>AROM due to weakness. Modified [place]: 1+ elbow flexors, no increase tone noted in elbow extensors. No increase in tone noted in LLE with the exception of tight L ankle plantarflexors. No clonus noted. Reflex testing deferred throughout UE/LE     Strength   Overall Strength Comments RUE/RLE: 5/5 throughout UE/LE; LUE/LLE: see below   Strength Assessment Site Shoulder;Elbow;Forearm;Hip;Knee;Ankle;Wrist;Hand   Right/Left Shoulder Left   Left Shoulder Flexion 3-/5   Left Shoulder ABduction 4-/5   Left Shoulder Internal Rotation 5/5   Left Shoulder External Rotation 4/5   Right/Left Elbow Left   Left Elbow Flexion 5/5   Left Elbow Extension 4+/5   Right/Left Forearm Left   Left Forearm Pronation 4/5   Left Forearm Supination 3/5   Right/Left Wrist Left   Left Wrist Flexion 4/5   Left Wrist Extension 4/5   Right/Left hand Left   Right/Left Hip Left   Left Hip Flexion 4-/5   Left Hip Extension 3+/5   Left Hip External Rotation 2/5   Left Hip Internal Rotation 2/5   Left Hip ABduction 3+/5   Left Hip ADduction 3-/5   Right/Left Knee Left   Left Knee Flexion 4+/5   Left Knee Extension 5/5   Right/Left Ankle Left   Left Ankle Dorsiflexion 0/5   Left Ankle Plantar Flexion 2-/5   Left Ankle Inversion 1/5   Left Ankle Eversion 0/5     Transfers   Comments Pt able to perform sit to stand transfers without UE support     Ambulation/Gait   Gait Comments Pt ambulates with QC in RUE and AFO LLE. Decreased R step length and stance time on LLE. Decreased L toe to floor clearance and decreased push off secondary to L AFO. Pt fails to reach full L knee extension prior to initial contact and has some lack of coordination with L foot placement. Pt does appears steady with use of assistive device.      Standardized Balance Assessment   Standardized Balance Assessment Timed Up  and Go Test;Berg Balance Test;Five Times Sit to Stand;10 meter walk test   Five times sit to stand comments  18.5 seconds   10 Meter Walk 0.53 m/s     Berg Balance Test   Sit to Stand Able to stand without using hands and stabilize independently   Standing Unsupported Able to stand safely 2 minutes   Sitting with Back Unsupported but Feet Supported on Floor or Stool Able to sit safely and securely 2 minutes   Stand to Sit Sits safely with minimal use of hands   Transfers  Able to transfer safely, minor use of hands   Standing Unsupported with Eyes Closed Able to stand 10 seconds safely   Standing Ubsupported with Feet Together Able to place feet together independently and stand 1 minute safely   From Standing, Reach Forward with Outstretched Arm Can reach confidently >25 cm (10")   From Standing Position, Pick up Object from Floor Able to pick up shoe safely and easily   From Standing Position, Turn to Look Behind Over each Shoulder Looks behind from both sides and weight shifts well   Turn 360 Degrees Able to turn 360 degrees safely but slowly   Standing Unsupported, Alternately Place Feet on Step/Stool Able to stand independently and complete 8 steps >20 seconds   Standing Unsupported, One Foot in Front Able to plae foot ahead of the other independently and hold 30 seconds   Standing on One Leg Tries to lift leg/unable to hold 3 seconds but remains standing independently   Total Score 49   Berg comment: Single leg stance LLE<1 second, RLE is >20 seconds     Timed Up and Go Test   TUG Normal TUG   Normal TUG (seconds) 21.9        .                          PT Education - 08/07/16 1607    Education provided Yes   Education Details Plan of care, HEP   Person(s) Educated Patient   Methods Explanation   Comprehension Verbalized understanding             PT Long Term Goals - 08/08/16 1446      PT LONG TERM GOAL #1   Title Pt will be independent with  HEP in order to improve strength and balance in order to decrease fall risk and improve function at home and work.   Time 8   Period Weeks   Status New     PT LONG TERM GOAL #2   Title Pt will improve BERG by at least 3 points in order to demonstrate clinically significant improvement in balance.   Baseline 08/07/16: 49/56   Time 8   Period Weeks   Status New     PT LONG TERM GOAL #3   Title Pt will decrease TUG to below 14 seconds in order to demonstrate decreased fall risk    Baseline 08/07/16: 21.9 seconds   Time 8   Period Weeks   Status New     PT LONG TERM GOAL #4   Title Pt will decrease 5TSTS by at least 3 seconds in order to demonstrate clinically significant improvement in LE strength   Baseline 08/07/16: 18.5 seconds   Time 8   Period Weeks   Status New     PT LONG TERM GOAL #5   Title Pt will increase 10MWT by at least 0.13 m/s in order to demonstrate clinically significant improvement in community ambulation   Baseline 08/07/16: 0.53 m/s   Time 8   Period Weeks   Status New     Additional Long Term Goals   Additional Long Term Goals Yes     PT LONG TERM GOAL #6   Title Pt will be able to swing a golf club safely in order to complete 9 holes of golf   Time 8   Period Weeks   Status New  Plan - 08/08/16 1428    Clinical Impression Statement Pt is a pleasant 60 yo male who was referred for physical therapy due to R basal ganglia CVA 07/14/16. Pt discharged from [facility] on 08/03/16. Pt presents today ambulating modified independent with wide base quad cane. Gait speed is decreased at 0.53 m/s and TUG is above cut-off at 21.9 seconds. Five time sit to stand is also above cut-off for increased fall risk at 18.5 seconds. Decreased balance indicated by BERG score of 49/56 with most notable deficits with 360 degree turns, alteranating toe taps, and LLE single leg balance. Pt with LUE/LLE strength deficits indicated by MMT. Most notably pt  unable to demonstrate any anterior tibialis muscle firing for dorsiflexion. Pt will benefit from skilled PT services to address deficits in strength, mobility, and balance in order to improve function and progress toward goals of playing golf and returning to work.      Rehab Potential Good   Clinical Impairments Affecting Rehab Potential Positive: motivation, prior functional level; Negative: no active dorsiflexion, increased LUE tone   PT Frequency 2x / week   PT Duration 8 weeks   PT Treatment/Interventions ADLs/Self Care Home Management;Biofeedback;Canalith Repostioning;Cryotherapy;Electrical Stimulation;Iontophoresis 4mg /ml Dexamethasone;Aquatic Therapy;Moist Heat;Traction;Ultrasound;Gait training;Stair training;Functional mobility training;Therapeutic activities;DME Instruction;Therapeutic exercise;Balance training;Neuromuscular re-education;Cognitive remediation;Patient/family education;Orthotic Fit/Training;Manual techniques;Passive range of motion;Energy conservation;Taping;Vestibular   PT Next Visit Plan 6MWT, DGI, LEFS, NME for L anterior tibialis, Walk-aide, progress balance, strengthening, and HEP   PT Home Exercise Plan Sit to stand, seated tband resisted clams, seated adduction squeeze   Consulted and Agree with Plan of Care Patient      Patient will benefit from skilled therapeutic intervention in order to improve the following deficits and impairments:  Abnormal gait, Decreased balance, Decreased coordination, Decreased mobility, Decreased range of motion, Decreased strength, Difficulty walking, Impaired tone, Impaired UE functional use  Visit Diagnosis: Muscle weakness (generalized) - Plan: PT plan of care cert/re-cert  Other abnormalities of gait and mobility - Plan: PT plan of care cert/re-cert  Unsteadiness on feet - Plan: PT plan of care cert/re-cert     Problem List Patient Active Problem List   Diagnosis Date Noted  . Neurocognitive disorder   . Left foot drop   .  Benign essential HTN   . Hemiparesis affecting nondominant side as late effect of cerebrovascular accident (CVA) ([place]) 07/18/2016  . Right basal ganglia embolic stroke ([place]) [id number]  . CVA (cerebral infarction) 07/15/2016  . TIA (transient ischemic attack) 07/14/2016  . HTN, goal below 140/80 07/14/2016  . Left facial numbness 07/14/2016  . Weakness of left side of body 07/14/2016   Phillips Grout PT, DPT   Laressa Bolinger 08/08/2016, 3:01 PM  [place] MAIN [facility] SERVICES [address] [place], [place], [address] Phone: [phone]   Fax:  [phone]  Name: Jason Oconnor MRN: [id number] Date of Birth: [birth year]

## 2016-08-09 ENCOUNTER — Encounter: Payer: Self-pay | Admitting: Occupational Therapy

## 2016-08-09 NOTE — Therapy (Signed)
[place] MAIN [facility] SERVICES [address] [place], [place], [address] Phone: [phone]   Fax:  [phone]  Occupational Therapy Evaluation  Patient Details  Name: Jason Oconnor MRN: [id number] Date of Birth: [birth year] No Data Recorded  Encounter Date: 08/07/2016      OT End of Session - 08/09/16 1337    Visit Number 1   Number of Visits 24   OT Start Time 1600   OT Stop Time 1700   OT Time Calculation (min) 60 min   Activity Tolerance Patient tolerated treatment well   Behavior During Therapy [facility] for tasks assessed/performed      Past Medical History:  Diagnosis Date  . Bone marrow disease   . Hypertension     History reviewed. No pertinent surgical history.  There were no vitals filed for this visit.      Subjective Assessment - 08/09/16 1336    Pertinent History Patient reports on July 13, 2016 he was helping a friend doing home repairs and felt tired, went home and started to notice problems moving around.  The next morning he feel trying to put his slippers on, wife transported him to ER and [facility] for 3 days and then went to [facility] for REhab and was discharged this past Saturday to home.     Patient Stated Goals "I want to get back to close as normal as possible, play golf again, work in the garage."     Currently in Pain? No/denies   Pain Score 0-No pain           [facility] OT Assessment - 08/09/16 1336      Assessment   Diagnosis CVA with left sided weakness   Onset Date 07/13/16   Prior Therapy IP rehab     Precautions   Precautions Fall     Restrictions   Weight Bearing Restrictions No     Balance Screen   Has the patient fallen in the past 6 months Yes   How many times? 1     [place] expects to be discharged to: Private residence   Living Arrangements Spouse/significant other   Available Help at Discharge Family   Type of [place]  One level   Bathroom Shower/Tub Walk-in Shower;Curtain   [place] -quad;Shower seat   Lives With [employer]     Prior Function   Level of Independence Independent   Vocation Full time employment   Vocation Requirements Currently off work. 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, industrial/product     ADL   Eating/Feeding Modified independent   [place] independent   Upper Body Bathing Minimal assistance   Lower Body Bathing Increased time   Upper Body Dressing Increased time;Needs assist for fasteners   Lower Body Dressing Increased time;Needs assist for fasteners   Toilet Tranfer Modified independent   Toileting - Clothing Manipulation Modified independent   Toileting -  Hygiene Modified Independent   Tub/Shower Transfer Supervision/safety     IADL   Prior Level of Function Shopping [employer] and [employer], wife does grocery   Shopping Assistance for transportation;Needs to be accompanied on any shopping trip   Prior Level of Function Light Housekeeping independent   Light Housekeeping Needs help with all home maintenance tasks  Prior Level of Function Meal Prep independent   Meal Prep Able to complete simple cold meal and snack prep   Prior Level of Function Community Mobility walking with quad cane now   [employer] on family or friends for transportation   Medication Management Is responsible for taking medication in correct dosages at correct time   Prior Level of Function Financial Management wife did before      Mobility   Mobility Status Needs assist   Mobility Status Comments use quad cane     Written Expression   Dominant Hand Right     Vision - History   Baseline Vision Wears glasses all the time     Vision Assessment   Comment reports some blurred vision at times in the left eye, will need further assessment     Cognition    Overall Cognitive Status Within Functional [place] for tasks assessed   Attention Focused;Sustained   Sustained Attention Appears intact   Awareness Appears intact   Problem Solving Appears intact     Sensation   Light Touch Impaired by gross assessment   Stereognosis Appears Intact   Hot/Cold Appears Intact     Coordination   Gross Motor Movements are Fluid and Coordinated No   Fine Motor Movements are Fluid and Coordinated No   9 Hole Peg Test Right;Left   Right 9 Hole Peg Test 24   Left 9 Hole Peg Test unable     ROM / Strength   AROM / PROM / Strength AROM;Strength     AROM   Overall AROM  Deficits   Overall AROM Comments left shoulder flexion to 125 degrees, ABD 121 degrees, elbow flexion 140 degrees, extension to 0.  Supination to neutral, full pronation, full wrist flex/ext.  full fisting.  Lacks a few degrees of finger extension on left.      Strength   Overall Strength Deficits   Left Shoulder Flexion 3-/5   Left Shoulder ABduction 4-/5   Left Shoulder Internal Rotation 5/5   Left Shoulder External Rotation 4/5   Right/Left Elbow Left   Left Elbow Flexion 5/5   Left Elbow Extension 4/5   Left Forearm Pronation 4/5   Left Forearm Supination 3-/5   Left Wrist Flexion 4/5   Left Wrist Extension 4/5     Hand Function   Right Hand Grip (lbs) 120   Right Hand Lateral Pinch 29 lbs   Right Hand 3 Point Pinch 26 lbs   Left Hand Grip (lbs) 23   Left Hand Lateral Pinch 10 lbs   Left 3 point pinch 5 lbs     Sensation Exercises   Stereognosis intact           Patient seen for A/AAROM in sitting for LUE, instructed on exercises for HEP with cues.                OT Education - 08/09/16 1336    Education provided Yes   Education Details Plan of care, HEP   Person(s) Educated Patient   Methods Explanation;Demonstration   Comprehension Returned demonstration;Verbalized understanding             OT Long Term Goals - 08/09/16 1340      OT  LONG TERM GOAL #1   Title Patient to improve left UE coordination sufficient to tie shoes with modified independence.    Baseline unable   Time 12   Period Weeks   Status New  OT LONG TERM GOAL #2   Title Patient to improve LUE coordination to complete buttons, snaps and zippers on clothing with modified independence.   Time 12   Period Weeks   Status New     OT LONG TERM GOAL #3   Title Patient to complete bathing with modified independence including using left arm to wash right side of body.    Time 12   Period Weeks   Status New     OT LONG TERM GOAL #4   Title Patient to improve left hand use to type on computer using both hands on home row keys, 2 lines with no errors in 5 minutes.    Baseline unable   Time 12   Period Weeks   Status New     OT LONG TERM GOAL #5   Title Patient will improve grip on left hand by 10# to stabilize jars and containers for opening.    Baseline unable   Time 12   Period Weeks   Status New     Long Term Additional Goals   Additional Long Term Goals Yes     OT LONG TERM GOAL #6   Title Patient will be able to demonstrate sweeping with use of bilateral UEs with modified independence.    Baseline unable   Time 12               Plan - 08/09/16 1338    Clinical Impression Statement Patient is a 60 yo male who was diagnosed with a CVA while hospitalized in .  He went to IP rehab for a couple weeks and was discharge home with a referral for OP OT services. 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  He would benefit from skilled OT to maximize his safety and independence with daily tasks at home, work and in the community.     Rehab Potential Good   OT Frequency 2x / week   OT Duration 12 weeks   OT Treatment/Interventions Self-care/ADL training;Therapeutic exercise;Neuromuscular  education;Visual/perceptual remediation/compensation;Therapist, nutritional;Therapeutic exercises;Patient/family education;DME and/or AE instruction;Manual Therapy;Therapeutic activities;Balance training   Consulted and Agree with Plan of Care Patient      Patient will benefit from skilled therapeutic intervention in order to improve the following deficits and impairments:  Decreased knowledge of use of DME, Decreased coordination, Impaired sensation, Decreased activity tolerance, Decreased range of motion, Decreased strength, Decreased balance, Difficulty walking, Impaired UE functional use  Visit Diagnosis: Muscle weakness (generalized)  Other lack of coordination    Problem List Patient Active Problem List   Diagnosis Date Noted  . Neurocognitive disorder   . Left foot drop   . Benign essential HTN   . Hemiparesis affecting nondominant side as late effect of cerebrovascular accident (CVA) ([place]) 07/18/2016  . Right basal ganglia embolic stroke ([place]) [id number]  . CVA (cerebral infarction) 07/15/2016  . TIA (transient ischemic attack) 07/14/2016  . HTN, goal below 140/80 07/14/2016  . Left facial numbness 07/14/2016  . Weakness of left side of body 07/14/2016   Cheick Suhr Oneita Jolly, OTR/L, CLT  Chrissa Meetze 08/09/2016, 1:56 PM  [place] MAIN [facility] SERVICES [address] [place], [place], [address] Phone: [phone]   Fax:  [phone]  Name: Jason Oconnor MRN: [id number] Date of Birth: 01-08-1956

## 2016-08-13 ENCOUNTER — Ambulatory Visit: Payer: 59 | Admitting: Physical Therapy

## 2016-08-13 ENCOUNTER — Ambulatory Visit (HOSPITAL_BASED_OUTPATIENT_CLINIC_OR_DEPARTMENT_OTHER): Payer: 59 | Admitting: Physical Medicine & Rehabilitation

## 2016-08-13 ENCOUNTER — Encounter: Payer: Self-pay | Admitting: Physical Medicine & Rehabilitation

## 2016-08-13 ENCOUNTER — Encounter: Payer: 59 | Attending: Physical Medicine & Rehabilitation

## 2016-08-13 ENCOUNTER — Encounter: Payer: Self-pay | Admitting: Physical Therapy

## 2016-08-13 ENCOUNTER — Inpatient Hospital Stay: Payer: 59 | Admitting: Physical Medicine & Rehabilitation

## 2016-08-13 ENCOUNTER — Ambulatory Visit: Payer: 59 | Admitting: Occupational Therapy

## 2016-08-13 VITALS — BP 124/80 | HR 85 | Resp 14

## 2016-08-13 VITALS — BP 130/89 | HR 96

## 2016-08-13 DIAGNOSIS — R531 Weakness: Secondary | ICD-10-CM | POA: Insufficient documentation

## 2016-08-13 DIAGNOSIS — M21372 Foot drop, left foot: Secondary | ICD-10-CM | POA: Insufficient documentation

## 2016-08-13 DIAGNOSIS — M6281 Muscle weakness (generalized): Secondary | ICD-10-CM | POA: Diagnosis not present

## 2016-08-13 DIAGNOSIS — I10 Essential (primary) hypertension: Secondary | ICD-10-CM | POA: Diagnosis not present

## 2016-08-13 DIAGNOSIS — R279 Unspecified lack of coordination: Secondary | ICD-10-CM | POA: Insufficient documentation

## 2016-08-13 DIAGNOSIS — I69354 Hemiplegia and hemiparesis following cerebral infarction affecting left non-dominant side: Secondary | ICD-10-CM | POA: Insufficient documentation

## 2016-08-13 DIAGNOSIS — R471 Dysarthria and anarthria: Secondary | ICD-10-CM | POA: Diagnosis not present

## 2016-08-13 DIAGNOSIS — M25472 Effusion, left ankle: Secondary | ICD-10-CM

## 2016-08-13 DIAGNOSIS — R2689 Other abnormalities of gait and mobility: Secondary | ICD-10-CM

## 2016-08-13 DIAGNOSIS — R278 Other lack of coordination: Secondary | ICD-10-CM

## 2016-08-13 DIAGNOSIS — R2681 Unsteadiness on feet: Secondary | ICD-10-CM

## 2016-08-13 NOTE — Progress Notes (Signed)
Subjective:  60 year old right-handed male with history of hypertension, married independent prior to admission. Presented to [facility] on July 14, 2016, with left-sided weakness and facial droop.  Blood pressure elevated 170/106. Cranial CT scan negative.  MRI of the brain showed acute infarct posterior basal ganglia on the right involving the posterior putamen. CTA of the head showed no emergent large vessel occlusion or stenosis. Carotid Dopplers with no ICA stenosis.  The patient did not receive tPA. Echocardiogram with ejection fraction of 123456, normal systolic function. Initially, placed on aspirin and Plavix for CVA prophylaxis, simplified to Plavix.  Subcutaneous Lovenox for DVT prophylaxis.  Physical and occupational therapy ongoing.  The patient was admitted for comprehensive rehab program. DATE OF ADMISSION:  07/15/2016 DATE OF DISCHARGE:  08/03/2016   Patient ID: Jason Oconnor, male    DOB: 02-21-1956, 60 y.o.   MRN: [id number]  HPI Saw PCP already Ankle hurt when [facility] was d/ced at home Ankle spasms , Mainly at night Has swelling in left ankle. At the end of the day Receiving outpatient therapy at [facility] Has not been driving Used to work as a Clinical cytogeneticist at [employer]. Currently on short-term disability Pain Inventory Average Pain 3 Pain Right Now 1 My pain is aching  In the last 24 hours, has pain interfered with the following? General activity 3 Relation with others 0 Enjoyment of life 5 What TIME of day is your pain at its worst? varies Sleep (in general) Fair  Pain is worse with: some activites Pain improves with: medication Relief from Meds: 8  Mobility walk with assistance use a cane ability to climb steps?  yes do you drive?  no Do you have any goals in this area?  yes  Function employed # of hrs/week . 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 No pertinent family history. Social History   Social History  . Marital status: Married    Spouse name: N/A  . Number of children: N/A  . Years of education: N/A   Social History Main Topics  . Smoking status: Former Research scientist (life sciences)  . Smokeless tobacco: Never Used  . Alcohol use Yes     Comment: rare  . Drug use: No  . Sexual activity: Not Asked   Other Topics Concern  . None   Social History Narrative  . None   History reviewed. No pertinent surgical history. Past Medical History:  Diagnosis Date  . Bone marrow disease   . Hypertension    BP 124/80 (BP Location: Right Arm, Patient Position: Sitting, Cuff Size: Large)   Pulse 85   Resp 14   SpO2 96%   Opioid Risk Score:   Fall Risk Score:  `1  Depression screen PHQ 2/9  No flowsheet data found.  Review of Systems  HENT: Negative.   Eyes: Negative.   Respiratory: Negative.   Cardiovascular: Negative.   Gastrointestinal: Negative.   Endocrine: Negative.   Genitourinary: Negative.   Musculoskeletal: Positive for gait problem.  Allergic/Immunologic: Negative.   Neurological: Positive for weakness and numbness.  Psychiatric/Behavioral: Negative.   All other systems reviewed and are negative.      Objective:   Physical Exam  Constitutional: He is oriented to person, place, and time. He appears well-developed and well-nourished.  HENT:  Head: Normocephalic  and atraumatic.  Eyes: Conjunctivae are normal. Pupils are equal, round, and reactive to light.  Neck: Normal range of motion.  Neurological: He is alert and oriented to person, place, and time.  Motor strength is 5/5 in the right deltoid, biceps, triceps, grip, hip flexor, knee extensor, ankle dorsal flexor plantar flexor. Left lower extremity. His 4 minus, hip flexor, knee extensor trace, ankle  dorsiflexor, plantar flexor, as well as toe flexors, extensor Left upper extremity 3 minus deltoid, biceps, triceps, grip, Decreased fine motor left upper extremity with decreased finger thumb opposition, particularly on the fifth digit. Sensation intact bilateral upper and lower limbs. Reduced rapid alternating movements. Left upper extremity supination, pronation. Ambulates with left AFO. He does some compensatory circumduction, although the AFO would likely clear him.  Psychiatric: He has a normal mood and affect.  Nursing note and vitals reviewed.         Assessment & Plan:  1. 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 Continue outpatient therapy. PT, OT.  No driving until I see him next visit No work. Estimate return to work 01/14/2017 at a sedentary level 2. Left ankle swelling. This is mainly dependent edema related to his ankle weakness. Do not think any imaging studies are needed at the current time. Discussed elevation, retrograde massage. This really has not changed in appearance since his stroke hospitalization  Over half of the 25 min visit was spent counseling and coordinating care.

## 2016-08-13 NOTE — Therapy (Signed)
[place] MAIN [facility] SERVICES [address] [place], [place], [address] Phone: [phone]   Fax:  [phone]  Physical Therapy Treatment  Patient Details  Name: Jason Oconnor MRN: [id number] Date of Birth: March 12, 1956 Referring Provider: Dr. Naaman Plummer  Encounter Date: 08/13/2016      PT End of Session - 08/13/16 2023    Visit Number 2   Number of Visits 17   Date for PT Re-Evaluation 10/02/16   Authorization Type no g codes   PT Start Time [id number]   PT Stop Time 1615   PT Time Calculation (min) 45 min   Equipment Utilized During Treatment Gait belt   Activity Tolerance Patient tolerated treatment well   Behavior During Therapy [facility] for tasks assessed/performed      Past Medical History:  Diagnosis Date  . Bone marrow disease   . Hypertension     History reviewed. No pertinent surgical history.  Vitals:   08/13/16 1546  BP: 130/89  Pulse: 96  SpO2: 98%        Subjective Assessment - 08/13/16 1547    Subjective Pt reports he has been trialing not using his cane in his home and denies any falls.  Reports most of his difficulty is with activities requiring him to use his L hand/UE.  Has been completing his HEP every day without issues.     Pertinent History Patient is a 60 year old right-handed male with history of hypertension. Per chart review patient is married. 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 Presented to [facility] 07/14/2016 with left-sided weakness and facial droop. Blood pressure elevated 170/106. Head CT scan negative. 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 CTA of the head showed no emergent large vessel occlusion or severe stenosis. Carotid Dopplers with no ICA stenosis. Patient did not receive TPA. Echocardiogram with ejection fraction of  60%. Normal systolic function. Placed on aspirin and Plavix for CVA prophylaxis. Subcutaneous Lovenox for DVT prophylaxis.  Patient transferred to CIR on 07/16/2016. Pt discharged form CIR on 08/03/16. Pt reports that he made progress at CIR. No reported problems since returning home. Currently using a wide base quad cane in RUE and L AFO. Pt reports that he has made the last progress with respect to his LUE strength.    Diagnostic tests See history   Patient Stated Goals "Get back as close as I can to normal." Pt would to be able to play golf. Pt would like to return to some kind of work.    Currently in Pain? No/denies       TREATMENT  Completed 6MWT and explained results with pt: 582 ft   Therapeutic Exercise:  Seated hip ER/Abd with RTB around knees, 2x10  Manually resisted L knee flexion and extension in sitting, 1x10. Fatigue at end of set  Seated L knee flexion and extension with star ball roll under L foot with cues for upright posture as well as cues for stability on ball. 2x10  Sidways walking in // bars with cues for slight knee bend ;however pt lacks necessary glute control and exercise discontinued  Rockerboard with weight shifts L and R (1 minute)  Lunges on airex to L and forward, x10 each direction. Cues for glute activation and weight shifting  Tandem walking in // bars, 4 lengths, pt holding onto // bars with BUEs  PT Education - 08/13/16 1550    Education provided Yes   Education Details Exercise technique   Person(s) Educated Patient   Methods Explanation;Demonstration;Tactile cues;Verbal cues   Comprehension Verbalized understanding;Returned demonstration             PT Long Term Goals - 08/13/16 2020      PT LONG TERM GOAL #1   Title Pt will be independent with HEP in order to improve strength and balance in order to decrease fall risk and improve function at home and work.   Time 8   Period Weeks   Status New     PT LONG  TERM GOAL #2   Title Pt will improve BERG by at least 3 points in order to demonstrate clinically significant improvement in balance.   Baseline 08/07/16: 49/56   Time 8   Period Weeks   Status New     PT LONG TERM GOAL #3   Title Pt will decrease TUG to below 14 seconds in order to demonstrate decreased fall risk    Baseline 08/07/16: 21.9 seconds   Time 8   Period Weeks   Status New     PT LONG TERM GOAL #4   Title Pt will decrease 5TSTS by at least 3 seconds in order to demonstrate clinically significant improvement in LE strength   Baseline 08/07/16: 18.5 seconds   Time 8   Period Weeks   Status New     PT LONG TERM GOAL #5   Title Pt will increase 10MWT by at least 0.13 m/s in order to demonstrate clinically significant improvement in community ambulation   Baseline 08/07/16: 0.53 m/s   Time 8   Period Weeks   Status New     Additional Long Term Goals   Additional Long Term Goals Yes     PT LONG TERM GOAL #6   Title Pt will be able to swing a golf club safely in order to complete 9 holes of golf   Time 8   Period Weeks   Status New     PT LONG TERM GOAL #7   Title Pt will improve 6MWT by at least 1106ft  to demonstrate improved aerobic capacity/endurance   Baseline 582 ft   Time 7   Period Weeks   Status New               Plan - 08/13/16 2025    Clinical Impression Statement Pt fatigues with 6MWT and demonstrates impaired aerobic endurance/capacity.  He demonstrates poor control with glute activation exercises (i.e. sideways walking) and may benefit from exercises in sidelying to address glute weakness.  Pt motivated and tolerated all interventions well today.  He will benefit from skilled PT for functional strengthening and control.   Rehab Potential Good   Clinical Impairments Affecting Rehab Potential Positive: motivation, prior functional level; Negative: no active dorsiflexion, increased LUE tone   PT Frequency 2x / week   PT Duration 8 weeks   PT  Treatment/Interventions ADLs/Self Care Home Management;Biofeedback;Canalith Repostioning;Cryotherapy;Electrical Stimulation;Iontophoresis 4mg /ml Dexamethasone;Aquatic Therapy;Moist Heat;Traction;Ultrasound;Gait training;Stair training;Functional mobility training;Therapeutic activities;DME Instruction;Therapeutic exercise;Balance training;Neuromuscular re-education;Cognitive remediation;Patient/family education;Orthotic Fit/Training;Manual techniques;Passive range of motion;Energy conservation;Taping;Vestibular   PT Next Visit Plan DGI, LEFS, NME for L anterior tibialis, Walk-aide, progress balance, strengthening, and HEP; Consider sidelying hip abduction exercises   PT Home Exercise Plan Sit to stand, seated tband resisted clams, seated adduction squeeze   Consulted and Agree with Plan of Care Patient      Patient will benefit  from skilled therapeutic intervention in order to improve the following deficits and impairments:  Abnormal gait, Decreased balance, Decreased coordination, Decreased mobility, Decreased range of motion, Decreased strength, Difficulty walking, Impaired tone, Impaired UE functional use  Visit Diagnosis: Muscle weakness (generalized)  Other abnormalities of gait and mobility  Unsteadiness on feet     Problem List Patient Active Problem List   Diagnosis Date Noted  . Neurocognitive disorder   . Left foot drop   . Benign essential HTN   . Hemiparesis affecting nondominant side as late effect of cerebrovascular accident (CVA) ([place]) 07/18/2016  . Right basal ganglia embolic stroke ([place]) [id number]  . CVA (cerebral infarction) 07/15/2016  . TIA (transient ischemic attack) 07/14/2016  . HTN, goal below 140/80 07/14/2016  . Left facial numbness 07/14/2016  . Weakness of left side of body 07/14/2016    Collie Siad PT, DPT 08/13/2016, 8:30 PM  [place] MAIN [facility] SERVICES [address] [place], [place], [address] Phone:  [phone]   Fax:  [phone]  Name: Karol Madray MRN: [id number] Date of Birth: 1956/01/28

## 2016-08-13 NOTE — Patient Instructions (Signed)
No driving Not ready for work

## 2016-08-16 ENCOUNTER — Ambulatory Visit: Payer: 59 | Admitting: Occupational Therapy

## 2016-08-16 ENCOUNTER — Ambulatory Visit: Payer: 59 | Admitting: Physical Therapy

## 2016-08-16 ENCOUNTER — Encounter: Payer: Self-pay | Admitting: Physical Therapy

## 2016-08-16 VITALS — BP 153/93 | HR 91

## 2016-08-16 DIAGNOSIS — R278 Other lack of coordination: Secondary | ICD-10-CM

## 2016-08-16 DIAGNOSIS — R2689 Other abnormalities of gait and mobility: Secondary | ICD-10-CM

## 2016-08-16 DIAGNOSIS — R2681 Unsteadiness on feet: Secondary | ICD-10-CM

## 2016-08-16 DIAGNOSIS — M6281 Muscle weakness (generalized): Secondary | ICD-10-CM

## 2016-08-16 NOTE — Therapy (Signed)
[place] MAIN [facility] SERVICES [address] [place], [place], [address] Phone: [phone]   Fax:  [phone]  Physical Therapy Treatment  Patient Details  Name: Jason Oconnor MRN: [id number] Date of Birth: 1956-10-10 Referring Provider: Dr. Naaman Plummer  Encounter Date: 08/16/2016      PT End of Session - 08/16/16 0849    Visit Number 3   Number of Visits 17   Date for PT Re-Evaluation 10/02/16   Authorization Type no g codes   PT Start Time 0802   PT Stop Time 0845   PT Time Calculation (min) 43 min   Equipment Utilized During Treatment Gait belt   Activity Tolerance Patient tolerated treatment well   Behavior During Therapy [facility] for tasks assessed/performed      Past Medical History:  Diagnosis Date  . Bone marrow disease   . Hypertension     History reviewed. No pertinent surgical history.  Vitals:   08/16/16 0807  BP: (!) 153/93  Pulse: 91  SpO2: 98%        Subjective Assessment - 08/16/16 0805    Subjective Pt reports he was very tired the day after his last PT session, mainly in his LEs. Trialed not using cane outside of house ~300 ft down sidewalk, had to catch his balance a few times but denies falls.  No complaints or concerns.   Pertinent History Patient is a 60 year old right-handed male with history of hypertension. Per chart review patient is married. 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 Presented to [facility] 07/14/2016 with left-sided weakness and facial droop. Blood pressure elevated 170/106. Head CT scan negative. 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 CTA of the head showed no emergent large vessel occlusion or severe stenosis. Carotid Dopplers with no ICA stenosis. Patient did not receive TPA. Echocardiogram with ejection fraction  of 60%. Normal systolic function. Placed on aspirin and Plavix for CVA prophylaxis. Subcutaneous Lovenox for DVT prophylaxis.  Patient transferred to CIR on 07/16/2016. Pt discharged form CIR on 08/03/16. Pt reports that he made progress at CIR. No reported problems since returning home. Currently using a wide base quad cane in RUE and L AFO. Pt reports that he has made the last progress with respect to his LUE strength.    Diagnostic tests See history   Patient Stated Goals "Get back as close as I can to normal." Pt would to be able to play golf. Pt would like to return to some kind of work.    Currently in Pain? No/denies        TREATMENT  Therapeutic Exercise:  Supine hip ER/Abd with RTB around knees, 2x10, 5 second holds on second set  Seated L knee flexion and extension with medicine ball under L foot with cues for upright posture as well as cues for stability on ball. Pt demonstrates difficulty maintaining L hip IR. 2x10  Seated manually resisted IR, 2x10 with 5 seconds holds  Clamshells Bil 2x10 with 3 second holds and cues for glute activation and exercise technique. Pt requires manual assist to hold L foot in place and for hip positioning. Visible fatigue with this.  Forward stepping over cones in // bars x 4 lengths. Pt demonstrate impaired coordination with LLE.  Seated coordination exercise with pt given cues to tap L foot to cards on mirror x2  minutes                 PT Education - 08/16/16 0807    Education provided Yes   Education Details Exercise technique   Person(s) Educated Patient   Methods Explanation;Demonstration;Tactile cues;Verbal cues   Comprehension Verbalized understanding;Returned demonstration             PT Long Term Goals - 08/13/16 2020      PT LONG TERM GOAL #1   Title Pt will be independent with HEP in order to improve strength and balance in order to decrease fall risk and improve function at home and work.   Time 8   Period Weeks    Status New     PT LONG TERM GOAL #2   Title Pt will improve BERG by at least 3 points in order to demonstrate clinically significant improvement in balance.   Baseline 08/07/16: 49/56   Time 8   Period Weeks   Status New     PT LONG TERM GOAL #3   Title Pt will decrease TUG to below 14 seconds in order to demonstrate decreased fall risk    Baseline 08/07/16: 21.9 seconds   Time 8   Period Weeks   Status New     PT LONG TERM GOAL #4   Title Pt will decrease 5TSTS by at least 3 seconds in order to demonstrate clinically significant improvement in LE strength   Baseline 08/07/16: 18.5 seconds   Time 8   Period Weeks   Status New     PT LONG TERM GOAL #5   Title Pt will increase 10MWT by at least 0.13 m/s in order to demonstrate clinically significant improvement in community ambulation   Baseline 08/07/16: 0.53 m/s   Time 8   Period Weeks   Status New     Additional Long Term Goals   Additional Long Term Goals Yes     PT LONG TERM GOAL #6   Title Pt will be able to swing a golf club safely in order to complete 9 holes of golf   Time 8   Period Weeks   Status New     PT LONG TERM GOAL #7   Title Pt will improve 6MWT by at least 131ft  to demonstrate improved aerobic capacity/endurance   Baseline 582 ft   Time 7   Period Weeks   Status New               Plan - 08/16/16 [id number]    Clinical Impression Statement Pt demonstrated poor IR strength and control with knee extension and flexion on ball but was able perform L hip IR in sitting.  Pt is quick to fatigue and requires manual assist to perform clamshell today.  He will benefit from skilled PT interventions to improve strength and neuromuscular control.   Rehab Potential Good   Clinical Impairments Affecting Rehab Potential Positive: motivation, prior functional level; Negative: no active dorsiflexion, increased LUE tone   PT Frequency 2x / week   PT Duration 8 weeks   PT Treatment/Interventions ADLs/Self Care Home  Management;Biofeedback;Canalith Repostioning;Cryotherapy;Electrical Stimulation;Iontophoresis 4mg /ml Dexamethasone;Aquatic Therapy;Moist Heat;Traction;Ultrasound;Gait training;Stair training;Functional mobility training;Therapeutic activities;DME Instruction;Therapeutic exercise;Balance training;Neuromuscular re-education;Cognitive remediation;Patient/family education;Orthotic Fit/Training;Manual techniques;Passive range of motion;Energy conservation;Taping;Vestibular   PT Next Visit Plan Clamshell; DGI, LEFS, NME for L anterior tibialis, Walk-aide, progress balance, strengthening, and HEP; Consider sidelying hip abduction exercises   PT Home Exercise Plan Sit to stand, seated tband resisted clams, seated adduction squeeze  Consulted and Agree with Plan of Care Patient      Patient will benefit from skilled therapeutic intervention in order to improve the following deficits and impairments:  Abnormal gait, Decreased balance, Decreased coordination, Decreased mobility, Decreased range of motion, Decreased strength, Difficulty walking, Impaired tone, Impaired UE functional use  Visit Diagnosis: Muscle weakness (generalized)  Other abnormalities of gait and mobility  Unsteadiness on feet     Problem List Patient Active Problem List   Diagnosis Date Noted  . Neurocognitive disorder   . Left foot drop   . Benign essential HTN   . Hemiparesis affecting nondominant side as late effect of cerebrovascular accident (CVA) ([place]) 07/18/2016  . Right basal ganglia embolic stroke ([place]) [id number]  . CVA (cerebral infarction) 07/15/2016  . TIA (transient ischemic attack) 07/14/2016  . HTN, goal below 140/80 07/14/2016  . Left facial numbness 07/14/2016  . Weakness of left side of body 07/14/2016    Collie Siad PT, DPT 08/16/2016, 8:50 AM  [place] MAIN [facility] SERVICES [address] [place], [place], [address] Phone: [phone]   Fax:   [phone]  Name: Pao Ghan MRN: [id number] Date of Birth: 04/30/1956

## 2016-08-19 ENCOUNTER — Encounter: Payer: Self-pay | Admitting: Occupational Therapy

## 2016-08-19 ENCOUNTER — Ambulatory Visit: Payer: 59 | Admitting: Occupational Therapy

## 2016-08-19 ENCOUNTER — Encounter: Payer: Self-pay | Admitting: Physical Therapy

## 2016-08-19 ENCOUNTER — Ambulatory Visit: Payer: 59 | Admitting: Physical Therapy

## 2016-08-19 DIAGNOSIS — R278 Other lack of coordination: Secondary | ICD-10-CM

## 2016-08-19 DIAGNOSIS — M6281 Muscle weakness (generalized): Secondary | ICD-10-CM

## 2016-08-19 NOTE — Therapy (Signed)
[place] MAIN [facility] SERVICES [address] [name], [place], [address] Phone: [phone]   Fax:  [phone]  Occupational Therapy Treatment  Patient Details  Name: Jason Oconnor MRN: [id number] Date of Birth: November 05, 1956 No Data Recorded  Encounter Date: 08/19/2016      OT End of Session - 08/19/16 1222    Visit Number 4   Number of Visits 24   OT Start Time 0930   OT Stop Time 1015   OT Time Calculation (min) 45 min   Activity Tolerance Patient tolerated treatment well   Behavior During Therapy [facility] for tasks assessed/performed      Past Medical History:  Diagnosis Date  . Bone marrow disease   . Hypertension     No past surgical history on file.  There were no vitals filed for this visit.      Subjective Assessment - 08/19/16 1220    Subjective  Pt. reports trying to do some of the exercises at home.   Currently in Pain? Yes   Pain Score 1    Pain Descriptors / Indicators Aching   Pain Type Acute pain   Pain Frequency Intermittent        OT TREATMENT    Neuro muscular re-education:  Pt. Worked on weight bearing through his LUE and hand. Pt. Education was provided about options for weightbearing techniques at home.   Therapeutic Exercise:  Pt. Tolerated PROM/AAROM/AROM for scapular elevation, depression, abd./rot.in sidelying, shoulder flexion, abduction, elbow flexion extension. Pt. Worked on shoulder stabilization exercises in supine, as well as protraction, forming the alphabet with cues for control, shoulder flexion with a light dowel in supine with cues to keep elbows straight. Elbow flexion, and extension exercises with shoulder at at 90 degrees.                       OT Education - 08/19/16 1240    Education provided Yes   Education Details weightbearing, and ther. ex for home.   Person(s) Educated Patient   Methods Explanation   Comprehension Verbalized understanding              OT Long Term Goals - 08/09/16 1340      OT LONG TERM GOAL #1   Title Patient to improve left UE coordination sufficient to tie shoes with modified independence.    Baseline unable   Time 12   Period Weeks   Status New     OT LONG TERM GOAL #2   Title Patient to improve LUE coordination to complete buttons, snaps and zippers on clothing with modified independence.   Time 12   Period Weeks   Status New     OT LONG TERM GOAL #3   Title Patient to complete bathing with modified independence including using left arm to wash right side of body.    Time 12   Period Weeks   Status New     OT LONG TERM GOAL #4   Title Patient to improve left hand use to type on computer using both hands on home row keys, 2 lines with no errors in 5 minutes.    Baseline unable   Time 12   Period Weeks   Status New     OT LONG TERM GOAL #5   Title Patient will improve grip on left hand by 10# to stabilize jars and containers for opening.    Baseline unable   Time 12   Period  Weeks   Status New     Long Term Additional Goals   Additional Long Term Goals Yes     OT LONG TERM GOAL #6   Title Patient will be able to demonstrate sweeping with use of bilateral UEs with modified independence.    Baseline unable   Time 12               Plan - 08/19/16 1222    Clinical Impression Statement Pt. shoulder pain is better today. Pt. presents with pain in left shoulder during elbow flexion, extension exercises with reaching to the opposite shoulder. Pt. was able to tolerate LUE exercises with rest breaks. Pt. requires cues for form, technique, position and motor control. Pt. continues to benefit from skilled OT services to work on improving LUE functioning for use during ADLs, and IADLs.   Rehab Potential Good   OT Frequency 2x / week   OT Duration 12 weeks   OT Treatment/Interventions Self-care/ADL training;Therapeutic exercise;Neuromuscular education;Visual/perceptual  remediation/compensation;Therapist, nutritional;Therapeutic exercises;Patient/family education;DME and/or AE instruction;Manual Therapy;Therapeutic activities;Balance training   Consulted and Agree with Plan of Care Patient      Patient will benefit from skilled therapeutic intervention in order to improve the following deficits and impairments:  Decreased knowledge of use of DME, Decreased coordination, Impaired sensation, Decreased activity tolerance, Decreased range of motion, Decreased strength, Decreased balance, Difficulty walking, Impaired UE functional use  Visit Diagnosis: Muscle weakness (generalized)    Problem List Patient Active Problem List   Diagnosis Date Noted  . Neurocognitive disorder   . Left foot drop   . Benign essential HTN   . Hemiparesis affecting nondominant side as late effect of cerebrovascular accident (CVA) ([place]) 07/18/2016  . Right basal ganglia embolic stroke ([place]) [id number]  . CVA (cerebral infarction) 07/15/2016  . TIA (transient ischemic attack) 07/14/2016  . HTN, goal below 140/80 07/14/2016  . Left facial numbness 07/14/2016  . Weakness of left side of body 07/14/2016    Harrel Carina, MS, OTR/L 08/19/2016, 3:23 PM  [place] MAIN [facility] SERVICES [address] [place], [place], [address] Phone: [phone]   Fax:  [phone]  Name: Hatem Parlapiano MRN: [id number] Date of Birth: 02/26/1956

## 2016-08-19 NOTE — Patient Instructions (Signed)
OT TREATMENT    Neuro muscular re-education:  Pt. Worked on weight bearing through his LUE and hand. Pt. Education was provided about options for weightbearing techniques at home.   Therapeutic Exercise:  Pt. Tolerated PROM/AAROM/AROM for scapular elevation, depression, abd./rot.in sidelying, shoulder flexion, abduction, elbow flexion extension. Pt. Worked on shoulder stabilization exercises in supine, as well as protraction, forming the alphabet with cues for control, shoulder flexion with a light dowel in supine with cues to keep elbows straight. Elbow flexion, and extension exercises with shoulder at at 90 degrees.

## 2016-08-19 NOTE — Therapy (Signed)
[place] MAIN [facility] SERVICES [address] [place], [place], [address] Phone: [phone]   Fax:  [phone]  Occupational Therapy Treatment  Patient Details  Name: Jason Oconnor MRN: [id number] Date of Birth: [birth year] No Data Recorded  Encounter Date: 08/13/2016      OT End of Session - 08/19/16 0516    Visit Number 2   Number of Visits 24   OT Start Time [id number]   OT Stop Time 1701   OT Time Calculation (min) 46 min   Activity Tolerance Patient tolerated treatment well   Behavior During Therapy [facility] for tasks assessed/performed      Past Medical History:  Diagnosis Date  . Bone marrow disease   . Hypertension     No past surgical history on file.  There were no vitals filed for this visit.      Subjective Assessment - 08/19/16 0501    Subjective  Patient reports he is ready to get started with therapy, wants to get better.  "Let's do this"   Pertinent History Patient reports on July 13, 2016 he was helping a friend doing home repairs and felt tired, went home and started to notice problems moving around.  The next morning he feel trying to put his slippers on, wife transported him to ER and [facility] for 3 days and then went to [facility] for REhab and was discharged this past Saturday to home.     Patient Stated Goals "I want to get back to close as normal as possible, play golf again, work in the garage."     Currently in Pain? Yes   Pain Score 3    Pain Orientation Left   Pain Descriptors / Indicators Aching   Pain Type Acute pain   Pain Frequency Intermittent   Aggravating Factors  when moving left arm towards flexion overhead, he has pain at the shoulder joint.   Multiple Pain Sites No                      OT Treatments/Exercises (OP) - 08/19/16 0503      Fine Motor Coordination   Other Fine Motor Exercises Patient seen for manipulation of 1 inch items from tabletop, picking up with left hand with cues for  technique and cues for manipulation.       Neurological Re-education Exercises   Scapular Stabilization Left;Supine;Other reps (number in comments)  5-7 reps each   Other Exercises 1 Patient seen for shoulder stabilization exercises in supine for left shoulder for shoulder flexion to 90 degrees, place and hold for 10 secs each for 5-7 reps for 2 sets, shoulder protraction for 7 reps for 2 sets, shoulder at 90 degrees of flexion with isolated elbow flexion to opposite shoulder and return with triceps press 2 sets of 7 reps.  Same exercise except to forehead and up 2 sets 7 reps.  Shoulder flexion to 90 degrees then forwards and backwards circles about the size of a grapefruit 10 reps each direction. Large block ROM with bilateral UE holding block with hands in neutral position for shoulder flexion for 2 sets of 7 reps, chest press for 2 sets 7 reps.  All exercises performed with cues for quality of movements, control, isolated muscle targeting and guiding as needed from therapist.  Rest breaks as needed between sets.       Manual Therapy   Manual Therapy Joint mobilization   Manual therapy comments LUE shoulder joint mobilization  glenohumeral for inferior and posterior mobs, Grade III prior to ROM to decrease pain and increase ROM. S                OT Education - 08/19/16 0511    Education provided Yes   Education Details HEP, ROM of LUE   Person(s) Educated Patient   Methods Explanation;Demonstration;Verbal cues   Comprehension Verbal cues required;Returned demonstration;Verbalized understanding             OT Long Term Goals - 08/09/16 1340      OT LONG TERM GOAL #1   Title Patient to improve left UE coordination sufficient to tie shoes with modified independence.    Baseline unable   Time 12   Period Weeks   Status New     OT LONG TERM GOAL #2   Title Patient to improve LUE coordination to complete buttons, snaps and zippers on clothing with modified independence.    Time 12   Period Weeks   Status New     OT LONG TERM GOAL #3   Title Patient to complete bathing with modified independence including using left arm to wash right side of body.    Time 12   Period Weeks   Status New     OT LONG TERM GOAL #4   Title Patient to improve left hand use to type on computer using both hands on home row keys, 2 lines with no errors in 5 minutes.    Baseline unable   Time 12   Period Weeks   Status New     OT LONG TERM GOAL #5   Title Patient will improve grip on left hand by 10# to stabilize jars and containers for opening.    Baseline unable   Time 12   Period Weeks   Status New     Long Term Additional Goals   Additional Long Term Goals Yes     OT LONG TERM GOAL #6   Title Patient will be able to demonstrate sweeping with use of bilateral UEs with modified independence.    Baseline unable   Time 12               Plan - 08/19/16 0516    Clinical Impression Statement Patient reports pain in left shoulder with ROM greater than shoulder height at beginning of session 3/10 pain, no pain noted at the end of session after joint mobs and therapeutic exercises.  Patient's L arm fatigues quickly with exercises and requires 5-7 reps of each exercise with short rest breaks of 10-30 sec after each set, focus on quality of movement, facilitation of normal movement patterns, shoulder stabilization and isolation of movement patterns with therapist cues, guiding and facilitory techniques.     Rehab Potential Good   OT Frequency 2x / week   OT Duration 12 weeks   OT Treatment/Interventions Self-care/ADL training;Therapeutic exercise;Neuromuscular education;Visual/perceptual remediation/compensation;Therapist, nutritional;Therapeutic exercises;Patient/family education;DME and/or AE instruction;Manual Therapy;Therapeutic activities;Balance training   Consulted and Agree with Plan of Care Patient      Patient will benefit from skilled therapeutic  intervention in order to improve the following deficits and impairments:  Decreased knowledge of use of DME, Decreased coordination, Impaired sensation, Decreased activity tolerance, Decreased range of motion, Decreased strength, Decreased balance, Difficulty walking, Impaired UE functional use  Visit Diagnosis: Muscle weakness (generalized)  Other lack of coordination    Problem List Patient Active Problem List   Diagnosis Date Noted  . Neurocognitive disorder   .  Left foot drop   . Benign essential HTN   . Hemiparesis affecting nondominant side as late effect of cerebrovascular accident (CVA) ([place]) 07/18/2016  . Right basal ganglia embolic stroke ([place]) [id number]  . CVA (cerebral infarction) 07/15/2016  . TIA (transient ischemic attack) 07/14/2016  . HTN, goal below 140/80 07/14/2016  . Left facial numbness 07/14/2016  . Weakness of left side of body 07/14/2016   [name] T [name], OTR/L, CLT  [name],[name] 08/19/2016, 5:22 AM  [place] MAIN [facility] SERVICES [address] [place], [place], [address] Phone: [phone]   Fax:  [phone]  Name: Jason Oconnor MRN: [id number] Date of Birth: 06/28/1956

## 2016-08-19 NOTE — Therapy (Signed)
[place] MAIN [facility] SERVICES [address] [place], [place], [address] Phone: [phone]   Fax:  [phone]  Physical Therapy Treatment  Patient Details  Name: Jason Oconnor MRN: [id number] Date of Birth: [birth year] Referring Provider: Dr. Naaman Plummer  Encounter Date: 08/19/2016      PT End of Session - 08/19/16 0844    Visit Number 4   Number of Visits 17   Date for PT Re-Evaluation 10/02/16   Authorization Type no g codes   PT Start Time 0832   PT Stop Time 0915   PT Time Calculation (min) 43 min   Equipment Utilized During Treatment Gait belt   Activity Tolerance Patient tolerated treatment well   Behavior During Therapy [facility] for tasks assessed/performed      Past Medical History:  Diagnosis Date  . Bone marrow disease   . Hypertension     History reviewed. No pertinent surgical history.  There were no vitals filed for this visit.      Subjective Assessment - 08/19/16 0842    Subjective Patient is doing well today. He is ambulating with large base quad cane. He ambualtes without the AD inside the house. Patient reports that he wants to have a normal gait is a few weeks and walk without the AD in a few weeks.    Pertinent History Patient is a 60 year old right-handed male with history of hypertension. Per chart review patient is married. 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 Presented to [facility] 07/14/2016 with left-sided weakness and facial droop. Blood pressure elevated 170/106. Head CT scan negative. 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 CTA of the head showed no emergent large vessel occlusion or severe stenosis. Carotid Dopplers with no ICA stenosis. Patient did not receive TPA. Echocardiogram with ejection fraction of 60%. Normal systolic  function. Placed on aspirin and Plavix for CVA prophylaxis. Subcutaneous Lovenox for DVT prophylaxis.  Patient transferred to [facility] on 07/16/2016. Pt discharged form [facility] on 08/03/16. Pt reports that he made progress at [facility]. No reported problems since returning home. Currently using a wide base quad cane in RUE and L AFO. Pt reports that he has made the last progress with respect to his LUE strength.    Diagnostic tests See history   Patient Stated Goals "Get back as close as I can to normal." Pt would to be able to play golf. Pt would like to return to some kind of work.    Pain Onset 1 to 4 weeks ago        Therex: TM elevation 1 and speed . 7 m/hour and . 8 mils / hour x 10 minutes Leg press 100 lbs x 15 x 2 Standing march 2x10 Side steps with RTB x 3 laps  with hands and 3 laps without hands Step ups x 20,  Eccentric step downs x 20  Mini squat 2x10 Standing hip abd with YTB  2x10  Pt requires mod verbal and tactile cues for proper exercise performance                          PT Education - 08/19/16 0843    Education provided Yes   Education Details HEP   Person(s) Educated Patient   Methods Explanation   Comprehension Verbalized understanding  PT Long Term Goals - 08/13/16 2020      PT LONG TERM GOAL #1   Title Pt will be independent with HEP in order to improve strength and balance in order to decrease fall risk and improve function at home and work.   Time 8   Period Weeks   Status New     PT LONG TERM GOAL #2   Title Pt will improve BERG by at least 3 points in order to demonstrate clinically significant improvement in balance.   Baseline 08/07/16: 49/56   Time 8   Period Weeks   Status New     PT LONG TERM GOAL #3   Title Pt will decrease TUG to below 14 seconds in order to demonstrate decreased fall risk    Baseline 08/07/16: 21.9 seconds   Time 8   Period Weeks   Status New     PT LONG TERM GOAL #4   Title Pt will decrease  5TSTS by at least 3 seconds in order to demonstrate clinically significant improvement in LE strength   Baseline 08/07/16: 18.5 seconds   Time 8   Period Weeks   Status New     PT LONG TERM GOAL #5   Title Pt will increase 10MWT by at least 0.13 m/s in order to demonstrate clinically significant improvement in community ambulation   Baseline 08/07/16: 0.53 m/s   Time 8   Period Weeks   Status New     Additional Long Term Goals   Additional Long Term Goals Yes     PT LONG TERM GOAL #6   Title Pt will be able to swing a golf club safely in order to complete 9 holes of golf   Time 8   Period Weeks   Status New     PT LONG TERM GOAL #7   Title Pt will improve 6MWT by at least 176ft  to demonstrate improved aerobic capacity/endurance   Baseline 582 ft   Time 7   Period Weeks   Status New               Plan - 08/19/16 [id number]    Clinical Impression Statement moderate verbal instruction to improve set up, proper use of LE, and improved posture and gait mechanics. Patient responded moderately to instruction   Rehab Potential Good   Clinical Impairments Affecting Rehab Potential Positive: motivation, prior functional level; Negative: no active dorsiflexion, increased LUE tone   PT Frequency 2x / week   PT Duration 8 weeks   PT Treatment/Interventions ADLs/Self Care Home Management;Biofeedback;Canalith Repostioning;Cryotherapy;Electrical Stimulation;Iontophoresis 4mg /ml Dexamethasone;Aquatic Therapy;Moist Heat;Traction;Ultrasound;Gait training;Stair training;Functional mobility training;Therapeutic activities;DME Instruction;Therapeutic exercise;Balance training;Neuromuscular re-education;Cognitive remediation;Patient/family education;Orthotic Fit/Training;Manual techniques;Passive range of motion;Energy conservation;Taping;Vestibular   PT Next Visit Plan [place]; DGI, LEFS, NME for L anterior tibialis, Walk-aide, progress balance, strengthening, and HEP; Consider sidelying hip  abduction exercises   PT Home Exercise Plan Sit to stand, seated tband resisted clams, seated adduction squeeze   Consulted and Agree with Plan of Care Patient      Patient will benefit from skilled therapeutic intervention in order to improve the following deficits and impairments:  Abnormal gait, Decreased balance, Decreased coordination, Decreased mobility, Decreased range of motion, Decreased strength, Difficulty walking, Impaired tone, Impaired UE functional use  Visit Diagnosis: Muscle weakness (generalized)  Other lack of coordination     Problem List Patient Active Problem List   Diagnosis Date Noted  . Neurocognitive disorder   . Left foot drop   . Benign  essential HTN   . Hemiparesis affecting nondominant side as late effect of cerebrovascular accident (CVA) ([place]) 07/18/2016  . Right basal ganglia embolic stroke ([place]) [id number]  . CVA (cerebral infarction) 07/15/2016  . TIA (transient ischemic attack) 07/14/2016  . HTN, goal below 140/80 07/14/2016  . Left facial numbness 07/14/2016  . Weakness of left side of body 07/14/2016   Alanson Puls, PT, DPT [place] 08/19/2016, 8:47 AM  [place] MAIN [facility] SERVICES [address] [place], [place], [address] Phone: [phone]   Fax:  [phone]  Name: Jason Oconnor MRN: [id number] Date of Birth: August 16, 1956

## 2016-08-19 NOTE — Therapy (Signed)
[place] MAIN [facility] SERVICES [address] [place], [place], [address] Phone: [phone]   Fax:  [phone]  Occupational Therapy Treatment  Patient Details  Name: Jason Oconnor MRN: [id number] Date of Birth: 07/18/1956 No Data Recorded  Encounter Date: 08/16/2016      OT End of Session - 08/19/16 0549    Visit Number 3   Number of Visits 24   OT Start Time 0845   OT Stop Time 0915   OT Time Calculation (min) 30 min   Activity Tolerance Patient tolerated treatment well   Behavior During Therapy [facility] for tasks assessed/performed      Past Medical History:  Diagnosis Date  . Bone marrow disease   . Hypertension     History reviewed. No pertinent surgical history.  There were no vitals filed for this visit.      Subjective Assessment - 08/19/16 0543    Subjective  Patient reports his left arm felt really tired the other day and it took him a day or more to recover from both OT and PT sessions.  Reports his arm has felt better, still some mild pain initially when performing ROM of the left shoulder with flexion greater than 100 degrees.    Pertinent History Patient reports on July 13, 2016 he was helping a friend doing home repairs and felt tired, went home and started to notice problems moving around.  The next morning he feel trying to put his slippers on, wife transported him to ER and [facility] for 3 days and then went to [facility] for REhab and was discharged this past Saturday to home.     Patient Stated Goals "I want to get back to close as normal as possible, play golf again, work in the garage."     Currently in Pain? Yes   Pain Score 2    Pain Location Shoulder   Pain Orientation Left   Pain Descriptors / Indicators Aching   Pain Type Acute pain   Pain Onset 1 to 4 weeks ago   Pain Frequency Intermittent   Multiple Pain Sites No                      OT Treatments/Exercises (OP) - 08/19/16 [id number]      Fine  Motor Coordination   Other Fine Motor Exercises --     Neurological Re-education Exercises   Other Exercises 1 Patient seen for shoulder stabilization exercises in supine for left shoulder for shoulder flexion to 90 degrees, place and hold for 10 secs each for 5-8 reps for 2 sets, shoulder protraction for 8 reps for 2 sets, shoulder at 90 degrees of flexion with isolated elbow flexion to opposite shoulder and return with triceps press 2 sets of 8 reps. Same exercise except to forehead and up 2 sets 8 reps. Shoulder flexion to 90 degrees then forwards and backwards circles about the size of a grapefruit 10 reps each direction. Small yoga block ROM with bilateral UE hold block with hands in neutral position for shoulder flexion for 2 sets of 8 reps, chest press for 2 sets 8 reps. All exercises performed with cues for quality of movements, control, isolated muscle targeting and guiding as needed from therapist. Rest breaks as needed between sets.      Manual Therapy   Manual Therapy Joint mobilization   Manual therapy comments LUE shoulder joint mobilization glenohumeral for inferior and posterior mobs, Grade III prior to ROM  to decrease pain and increase ROM. S                OT Education - 08/19/16 0549    Education provided Yes   Education Details HEP shoulder stabilization exercises   Person(s) Educated Patient   Methods Explanation;Demonstration;Tactile cues   Comprehension Verbal cues required;Returned demonstration;Verbalized understanding             OT Long Term Goals - 08/09/16 1340      OT LONG TERM GOAL #1   Title Patient to improve left UE coordination sufficient to tie shoes with modified independence.    Baseline unable   Time 12   Period Weeks   Status New     OT LONG TERM GOAL #2   Title Patient to improve LUE coordination to complete buttons, snaps and zippers on clothing with modified independence.   Time 12   Period Weeks   Status New     OT LONG TERM  GOAL #3   Title Patient to complete bathing with modified independence including using left arm to wash right side of body.    Time 12   Period Weeks   Status New     OT LONG TERM GOAL #4   Title Patient to improve left hand use to type on computer using both hands on home row keys, 2 lines with no errors in 5 minutes.    Baseline unable   Time 12   Period Weeks   Status New     OT LONG TERM GOAL #5   Title Patient will improve grip on left hand by 10# to stabilize jars and containers for opening.    Baseline unable   Time 12   Period Weeks   Status New     Long Term Additional Goals   Additional Long Term Goals Yes     OT LONG TERM GOAL #6   Title Patient will be able to demonstrate sweeping with use of bilateral UEs with modified independence.    Baseline unable   Time 12               Plan - 08/19/16 0550    Clinical Impression Statement Patient continues to demonstrate decreased pain in left shoulder with shoulder mobilizations and shoulder stabilization exercises.  He progressed to 8 reps of each exercise this date with continued cues for quality of movements, decreased substitutions and normal movement patterns, continue to work towards improving strength, ROM and decreasing pain to use the LUE functionally for self care and work tasks.    Rehab Potential Good   OT Frequency 2x / week   OT Duration 12 weeks   OT Treatment/Interventions Self-care/ADL training;Therapeutic exercise;Neuromuscular education;Visual/perceptual remediation/compensation;Therapist, nutritional;Therapeutic exercises;Patient/family education;DME and/or AE instruction;Manual Therapy;Therapeutic activities;Balance training   Consulted and Agree with Plan of Care Patient      Patient will benefit from skilled therapeutic intervention in order to improve the following deficits and impairments:  Decreased knowledge of use of DME, Decreased coordination, Impaired sensation, Decreased activity  tolerance, Decreased range of motion, Decreased strength, Decreased balance, Difficulty walking, Impaired UE functional use  Visit Diagnosis: Muscle weakness (generalized)  Other lack of coordination    Problem List Patient Active Problem List   Diagnosis Date Noted  . Neurocognitive disorder   . Left foot drop   . Benign essential HTN   . Hemiparesis affecting nondominant side as late effect of cerebrovascular accident (CVA) ([place]) 07/18/2016  . Right basal ganglia embolic  stroke ([place]) 07/16/2016  . CVA (cerebral infarction) 07/15/2016  . TIA (transient ischemic attack) 07/14/2016  . HTN, goal below 140/80 07/14/2016  . Left facial numbness 07/14/2016  . Weakness of left side of body 07/14/2016   Michelene Keniston Oneita Jolly, OTR/L, CLT  Lucindy Borel 08/19/2016, 5:55 AM  [place] MAIN [facility] SERVICES [address] [place], [place], [address] Phone: [phone]   Fax:  [phone]  Name: Roarke Dutkiewicz MRN: [id number] Date of Birth: 03/14/1956

## 2016-08-23 ENCOUNTER — Encounter: Payer: Self-pay | Admitting: Occupational Therapy

## 2016-08-23 ENCOUNTER — Ambulatory Visit: Payer: 59

## 2016-08-23 ENCOUNTER — Ambulatory Visit: Payer: 59 | Admitting: Occupational Therapy

## 2016-08-23 VITALS — BP 144/89 | HR 88

## 2016-08-23 DIAGNOSIS — R2689 Other abnormalities of gait and mobility: Secondary | ICD-10-CM

## 2016-08-23 DIAGNOSIS — R278 Other lack of coordination: Secondary | ICD-10-CM

## 2016-08-23 DIAGNOSIS — M6281 Muscle weakness (generalized): Secondary | ICD-10-CM

## 2016-08-23 NOTE — Therapy (Signed)
[place] MAIN [facility] SERVICES [address] [place], [place], [address] Phone: [phone]   Fax:  [phone]  Physical Therapy Treatment  Patient Details  Name: Jason Oconnor MRN: [id number] Date of Birth: 06-24-1956 Referring Provider: Dr. Naaman Plummer  Encounter Date: 08/23/2016      PT End of Session - 08/23/16 0815    Visit Number 5   Number of Visits 17   Date for PT Re-Evaluation 10/02/16   Authorization Type no g codes   PT Start Time 0801   PT Stop Time 0845   PT Time Calculation (min) 44 min   Equipment Utilized During Treatment Gait belt   Activity Tolerance Patient tolerated treatment well   Behavior During Therapy [facility] for tasks assessed/performed      Past Medical History:  Diagnosis Date  . Bone marrow disease   . Hypertension     History reviewed. No pertinent surgical history.  Vitals:   08/23/16 0805  BP: (!) 144/89  Pulse: 88  SpO2: 99%        Subjective Assessment - 08/23/16 0807    Subjective Patient states he continues to have difficulty walking and extending out the leg. Reports decreased hamstring strenth on the L LE. Patient states he'd be interested in performing electrical stimulation for muscle activation of his L LE.    Pertinent History Patient is a 60 year old right-handed male with history of hypertension. Per chart review patient is married. 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 Presented to [facility] 07/14/2016 with left-sided weakness and facial droop. Blood pressure elevated 170/106. Head CT scan negative. 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 CTA of the head showed no emergent large vessel occlusion or severe stenosis. Carotid Dopplers with no ICA stenosis. Patient did not receive TPA. Echocardiogram with ejection  fraction of 60%. Normal systolic function. Placed on aspirin and Plavix for CVA prophylaxis. Subcutaneous Lovenox for DVT prophylaxis.  Patient transferred to CIR on 07/16/2016. Pt discharged form CIR on 08/03/16. Pt reports that he made progress at CIR. No reported problems since returning home. Currently using a wide base quad cane in RUE and L AFO. Pt reports that he has made the last progress with respect to his LUE strength.    Diagnostic tests See history   Patient Stated Goals "Get back as close as I can to normal." Pt would to be able to play golf. Pt would like to return to some kind of work.    Pain Onset 1 to 4 weeks ago      TREATMENT: Therapeutic Exercise: Hamstring pull backs with YTB - 2 x8 Single leg ball roll outs - 2 x 10 Leg Press - 105# 3 x 10 single leg LLE Sit to stands without UE - 3 x 10 RTB  Neuromuscular Rehab Step ups onto airex - x20 (Up with the left down with the right) Side stepping on airex beam - x5 down and back Tandem walking on airex beam - x 5 down and back  Standing cone taps with bilateral LEs with UE support at the  bars - x15 both sides with 2 cones out in front.  *patient requires cueing on LE positioning and posture.        PT Education - 08/23/16 [phone]    Education provided Yes   Education Details HEP: Seated ball  roll outs in sitting   Person(s) Educated Patient   Methods Explanation;Demonstration   Comprehension Verbalized understanding;Returned demonstration;Verbal cues required             PT Long Term Goals - 08/13/16 2020      PT LONG TERM GOAL #1   Title Pt will be independent with HEP in order to improve strength and balance in order to decrease fall risk and improve function at home and work.   Time 8   Period Weeks   Status New     PT LONG TERM GOAL #2   Title Pt will improve BERG by at least 3 points in order to demonstrate clinically significant improvement in balance.   Baseline 08/07/16: 49/56   Time 8   Period  Weeks   Status New     PT LONG TERM GOAL #3   Title Pt will decrease TUG to below 14 seconds in order to demonstrate decreased fall risk    Baseline 08/07/16: 21.9 seconds   Time 8   Period Weeks   Status New     PT LONG TERM GOAL #4   Title Pt will decrease 5TSTS by at least 3 seconds in order to demonstrate clinically significant improvement in LE strength   Baseline 08/07/16: 18.5 seconds   Time 8   Period Weeks   Status New     PT LONG TERM GOAL #5   Title Pt will increase 10MWT by at least 0.13 m/s in order to demonstrate clinically significant improvement in community ambulation   Baseline 08/07/16: 0.53 m/s   Time 8   Period Weeks   Status New     Additional Long Term Goals   Additional Long Term Goals Yes     PT LONG TERM GOAL #6   Title Pt will be able to swing a golf club safely in order to complete 9 holes of golf   Time 8   Period Weeks   Status New     PT LONG TERM GOAL #7   Title Pt will improve 6MWT by at least 165ft  to demonstrate improved aerobic capacity/endurance   Baseline 582 ft   Time 7   Period Weeks   Status New               Plan - 08/23/16 [id number]    Clinical Impression Statement Pt demonstrates decreased hamstring and quadriceps strength with the LLE focused session on improving muscular strength and coordination of the LEs. Patient continues to demonstrate decreased coordination with ambulation and use of LLE and will benefit from further skilled therapy to return to prior level of function.     Rehab Potential Good   Clinical Impairments Affecting Rehab Potential Positive: motivation, prior functional level; Negative: no active dorsiflexion, increased LUE tone   PT Frequency 2x / week   PT Duration 8 weeks   PT Treatment/Interventions ADLs/Self Care Home Management;Biofeedback;Canalith Repostioning;Cryotherapy;Electrical Stimulation;Iontophoresis 4mg /ml Dexamethasone;Aquatic Therapy;Moist Heat;Traction;Ultrasound;Gait training;Stair  training;Functional mobility training;Therapeutic activities;DME Instruction;Therapeutic exercise;Balance training;Neuromuscular re-education;Cognitive remediation;Patient/family education;Orthotic Fit/Training;Manual techniques;Passive range of motion;Energy conservation;Taping;Vestibular   PT Next Visit Plan Clamshell; DGI, LEFS, NME for L anterior tibialis, Walk-aide, progress balance, strengthening, and HEP; Consider sidelying hip abduction exercises   PT Home Exercise Plan Sit to stand, seated tband resisted clams, seated adduction squeeze   Consulted and Agree with Plan of Care Patient      Patient will benefit from skilled therapeutic intervention in order to improve the following deficits and impairments:  Abnormal gait,  Decreased balance, Decreased coordination, Decreased mobility, Decreased range of motion, Decreased strength, Difficulty walking, Impaired tone, Impaired UE functional use  Visit Diagnosis: Muscle weakness (generalized)  Other lack of coordination  Other abnormalities of gait and mobility     Problem List Patient Active Problem List   Diagnosis Date Noted  . Neurocognitive disorder   . Left foot drop   . Benign essential HTN   . Hemiparesis affecting nondominant side as late effect of cerebrovascular accident (CVA) ([place]) 07/18/2016  . Right basal ganglia embolic stroke ([place]) [id number]  . CVA (cerebral infarction) 07/15/2016  . TIA (transient ischemic attack) 07/14/2016  . HTN, goal below 140/80 07/14/2016  . Left facial numbness 07/14/2016  . Weakness of left side of body 07/14/2016    Blythe Stanford, PT DPT 08/23/2016, 8:53 AM  [place] MAIN [facility] SERVICES [address] [place], [place], [address] Phone: [phone]   Fax:  [phone]  Name: Eduar Couzens MRN: [id number] Date of Birth: [birth year]

## 2016-08-23 NOTE — Therapy (Signed)
[place] MAIN [facility] SERVICES [address] [place], [place], [address] Phone: [phone]   Fax:  [phone]  Occupational Therapy Treatment  Patient Details  Name: Jason Oconnor MRN: [id number] Date of Birth: 02-18-1956 No Data Recorded  Encounter Date: 08/23/2016      OT End of Session - 08/23/16 1652    Visit Number 5   Number of Visits 24   OT Start Time 0845   OT Stop Time 0931   OT Time Calculation (min) 46 min   Activity Tolerance Patient tolerated treatment well   Behavior During Therapy [facility] for tasks assessed/performed      Past Medical History:  Diagnosis Date  . Bone marrow disease   . Hypertension     History reviewed. No pertinent surgical history.  There were no vitals filed for this visit.      Subjective Assessment - 08/23/16 1644    Subjective  Patient reports he is trying to use his hand as much as possible, trying to open the door knob, push the chair under the table, reach and grab items, etc.  Wife encourages him to use his hand for all tasks at home and when they go out.    Pertinent History Patient reports on July 13, 2016 he was helping a friend doing home repairs and felt tired, went home and started to notice problems moving around.  The next morning he feel trying to put his slippers on, wife transported him to ER and [facility] for 3 days and then went to [facility] for REhab and was discharged this past Saturday to home.     Patient Stated Goals "I want to get back to close as normal as possible, play golf again, work in the garage."     Currently in Pain? No/denies   Pain Score 0-No pain                      OT Treatments/Exercises (OP) - 08/23/16 1645      ADLs   ADL Comments Patient instructed on a variety of positions for sleeping to support left arm, shoulder and hips.  Patient to try over the weekend and see if this helps decrease pain in the shoulder area.      Fine Motor  Coordination   Other Fine Motor Exercises Patient seen for fine motor coordination tasks with left hand for picking up 1/2 inch sized items, unable to use isolated finger movements to flip or turn, able to place and move 1 row with cues.  Attempts to manipulate [place] discs with left hand with cues for technique and increased time to complete.       Neurological Re-education Exercises   Other Exercises 1 Patient seen for shoulder stabilization exercises in supine for left shoulder for shoulder flexion to 90 degrees, place and hold for 10 secs each for 10 reps for 1 sets, shoulder protraction for 10 reps for 1 sets, shoulder at 90 degrees of flexion with isolated elbow flexion to opposite shoulder and return with triceps press 2 sets of 10 reps. Same exercise except to forehead and up 2 sets 10 reps.  ROM holding medium ball with bilateral UE holding ball with hands in neutral position for shoulder flexion for 2 sets of 10 reps, chest press for 2 sets 10 reps. Alphabet exercise for 1 set from A-Z.  All exercises performed with cues for quality of movements, control, isolated muscle targeting and guiding as needed  from therapist. Rest breaks as needed between sets.   Other Exercises 2 Reciprocal arm movements in supine with holding small dowel sticks in each hand with hand in neutral position, cues for working towards automaticity and for rhythm.     Manual Therapy   Manual Therapy Joint mobilization   Manual therapy comments LUE shoulder joint mobilization glenohumeral for inferior and posterior mobs, Grade III prior to ROM to decrease pain and increase ROM. S                OT Education - 08/23/16 1651    Education provided Yes   Education Details sleeping positions, arm exercises.    Person(s) Educated Patient   Methods Explanation;Demonstration;Verbal cues   Comprehension Verbal cues required;Returned demonstration;Verbalized understanding             OT Long Term Goals -  08/09/16 1340      OT LONG TERM GOAL #1   Title Patient to improve left UE coordination sufficient to tie shoes with modified independence.    Baseline unable   Time 12   Period Weeks   Status New     OT LONG TERM GOAL #2   Title Patient to improve LUE coordination to complete buttons, snaps and zippers on clothing with modified independence.   Time 12   Period Weeks   Status New     OT LONG TERM GOAL #3   Title Patient to complete bathing with modified independence including using left arm to wash right side of body.    Time 12   Period Weeks   Status New     OT LONG TERM GOAL #4   Title Patient to improve left hand use to type on computer using both hands on home row keys, 2 lines with no errors in 5 minutes.    Baseline unable   Time 12   Period Weeks   Status New     OT LONG TERM GOAL #5   Title Patient will improve grip on left hand by 10# to stabilize jars and containers for opening.    Baseline unable   Time 12   Period Weeks   Status New     Long Term Additional Goals   Additional Long Term Goals Yes     OT LONG TERM GOAL #6   Title Patient will be able to demonstrate sweeping with use of bilateral UEs with modified independence.    Baseline unable   Time 12               Plan - 08/23/16 1652    Clinical Impression Statement Patient has continued to progress, still has some pain at times in his left shoulder but did not have any pain this date, did not move past 120 degrees of shoulder flexion with exercises and activity.  Patient is working on using the hand at home and incorporating it into his daily routine as much as possible.  He continues to make progress and demonstrates good motivation for exercises and carryover.   Rehab Potential Good   OT Frequency 2x / week   OT Duration 12 weeks   OT Treatment/Interventions Self-care/ADL training;Therapeutic exercise;Neuromuscular education;Visual/perceptual remediation/compensation;Wellsite geologist;Therapeutic exercises;Patient/family education;DME and/or AE instruction;Manual Therapy;Therapeutic activities;Balance training   Consulted and Agree with Plan of Care Patient      Patient will benefit from skilled therapeutic intervention in order to improve the following deficits and impairments:  Decreased knowledge of use of DME, Decreased coordination, Impaired  sensation, Decreased activity tolerance, Decreased range of motion, Decreased strength, Decreased balance, Difficulty walking, Impaired UE functional use  Visit Diagnosis: Muscle weakness (generalized)  Other lack of coordination    Problem List Patient Active Problem List   Diagnosis Date Noted  . Neurocognitive disorder   . Left foot drop   . Benign essential HTN   . Hemiparesis affecting nondominant side as late effect of cerebrovascular accident (CVA) ([place]) 07/18/2016  . Right basal ganglia embolic stroke ([place]) 123XX123  . CVA (cerebral infarction) 07/15/2016  . TIA (transient ischemic attack) 07/14/2016  . HTN, goal below 140/80 07/14/2016  . Left facial numbness 07/14/2016  . Weakness of left side of body 07/14/2016   Zia Najera Oneita Jolly, OTR/L, CLT  Nellie Pester 08/23/2016, 4:55 PM  [place] MAIN [facility] SERVICES [address] [place], [place], [address] Phone: [phone]   Fax:  [phone]  Name: Jason Oconnor MRN: [id number] Date of Birth: 03-02-1956

## 2016-08-27 ENCOUNTER — Ambulatory Visit: Payer: 59 | Attending: Physical Medicine & Rehabilitation

## 2016-08-27 ENCOUNTER — Ambulatory Visit: Payer: 59 | Admitting: Occupational Therapy

## 2016-08-27 VITALS — BP 118/72 | HR 79

## 2016-08-27 DIAGNOSIS — M6281 Muscle weakness (generalized): Secondary | ICD-10-CM | POA: Diagnosis present

## 2016-08-27 DIAGNOSIS — R278 Other lack of coordination: Secondary | ICD-10-CM | POA: Insufficient documentation

## 2016-08-27 DIAGNOSIS — R2689 Other abnormalities of gait and mobility: Secondary | ICD-10-CM | POA: Diagnosis present

## 2016-08-27 DIAGNOSIS — R2681 Unsteadiness on feet: Secondary | ICD-10-CM | POA: Insufficient documentation

## 2016-08-27 NOTE — Therapy (Signed)
[place] MAIN [facility] SERVICES [address] [place], [place], [address] Phone: [phone]   Fax:  [phone]  Physical Therapy Treatment  Patient Details  Name: Jason Oconnor MRN: [id number] Date of Birth: 18-May-1956 Referring Provider: Dr. Naaman Plummer  Encounter Date: 08/27/2016      PT End of Session - 08/27/16 0850    Visit Number 6   Number of Visits 17   Date for PT Re-Evaluation 10/02/16   Authorization Type no g codes   PT Start Time 0801   PT Stop Time 0846   PT Time Calculation (min) 45 min   Equipment Utilized During Treatment Gait belt   Activity Tolerance Patient tolerated treatment well   Behavior During Therapy [facility] for tasks assessed/performed      Past Medical History:  Diagnosis Date  . Bone marrow disease   . Hypertension     History reviewed. No pertinent surgical history.  Vitals:   08/27/16 0805  BP: 118/72  Pulse: 79        Subjective Assessment - 08/27/16 0806    Subjective Patient states he is frustrated with rate recovery since onset on stroke. States has most difficulty with ambulating and using his L UE.    Pertinent History Patient is a 60 year old right-handed male with history of hypertension. Per chart review patient is married. 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 Presented to [facility] 07/14/2016 with left-sided weakness and facial droop. Blood pressure elevated 170/106. Head CT scan negative. 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 CTA of the head showed no emergent large vessel occlusion or severe stenosis. Carotid Dopplers with no ICA stenosis. Patient did not receive TPA. Echocardiogram with ejection fraction of 60%. Normal systolic function. Placed on aspirin and Plavix for CVA prophylaxis. Subcutaneous Lovenox  for DVT prophylaxis.  Patient transferred to CIR on 07/16/2016. Pt discharged form CIR on 08/03/16. Pt reports that he made progress at CIR. No reported problems since returning home. Currently using a wide base quad cane in RUE and L AFO. Pt reports that he has made the last progress with respect to his LUE strength.    Diagnostic tests See history   Patient Stated Goals "Get back as close as I can to normal." Pt would to be able to play golf. Pt would like to return to some kind of work.    Pain Onset 1 to 4 weeks ago      TREATMENT: Therapeutic Exercise: Ball roll outs under the foot - x 13min Hamstring pull backs with YTB - 3 x 10 Dorsiflexion against 2# weight - 2 x15 Seated plantarflexion in sitting - x 30 Standing hamstring curls - 2 x 15  Standing hamstring stretch with leg out in front with UE support - 2 x 10 sec Leg Press - 105# x 10, 120# 2 x 10 single leg LLE   Neuromuscular Rehab Step ups onto  double airex - x20 (Up with the left down with the right) Single leg stance on airex pad - 2 x 20 sec Tandem walking on airex beam - x 5 down and back  Standing cone taps with bilateral LEs with UE support at the  bars - x15 both sides with 2 cones out in front.  *patient requires cueing on LE positioning and posture.       PT  Education - 08/27/16 0849    Education provided Yes   Education Details HEP: Dorsiflexion with band, plantarflexion in sitting   Person(s) Educated Patient   Methods Explanation;Demonstration   Comprehension Verbalized understanding;Returned demonstration             PT Long Term Goals - 08/13/16 2020      PT LONG TERM GOAL #1   Title Pt will be independent with HEP in order to improve strength and balance in order to decrease fall risk and improve function at home and work.   Time 8   Period Weeks   Status New     PT LONG TERM GOAL #2   Title Pt will improve BERG by at least 3 points in order to demonstrate clinically significant improvement  in balance.   Baseline 08/07/16: 49/56   Time 8   Period Weeks   Status New     PT LONG TERM GOAL #3   Title Pt will decrease TUG to below 14 seconds in order to demonstrate decreased fall risk    Baseline 08/07/16: 21.9 seconds   Time 8   Period Weeks   Status New     PT LONG TERM GOAL #4   Title Pt will decrease 5TSTS by at least 3 seconds in order to demonstrate clinically significant improvement in LE strength   Baseline 08/07/16: 18.5 seconds   Time 8   Period Weeks   Status New     PT LONG TERM GOAL #5   Title Pt will increase 10MWT by at least 0.13 m/s in order to demonstrate clinically significant improvement in community ambulation   Baseline 08/07/16: 0.53 m/s   Time 8   Period Weeks   Status New     Additional Long Term Goals   Additional Long Term Goals Yes     PT LONG TERM GOAL #6   Title Pt will be able to swing a golf club safely in order to complete 9 holes of golf   Time 8   Period Weeks   Status New     PT LONG TERM GOAL #7   Title Pt will improve 6MWT by at least 123ft  to demonstrate improved aerobic capacity/endurance   Baseline 582 ft   Time 7   Period Weeks   Status New               Plan - 08/27/16 0850    Clinical Impression Statement Focused on performing exercises for muscular coordination and improving muscular endurance for the LLE ankle dorsiflexion/plantarflexion and hamstring to improve ability to ambulate. Patient demonstrates improved motor control with hamstring exercises but continues with decreased strength, endurance, and activation of the ankle musculature. Patient will benefit from further skilled therapy focused on improving limitations to return to prior level of function.    Rehab Potential Good   Clinical Impairments Affecting Rehab Potential Positive: motivation, prior functional level; Negative: no active dorsiflexion, increased LUE tone   PT Frequency 2x / week   PT Duration 8 weeks   PT Treatment/Interventions  ADLs/Self Care Home Management;Biofeedback;Canalith Repostioning;Cryotherapy;Electrical Stimulation;Iontophoresis 4mg /ml Dexamethasone;Aquatic Therapy;Moist Heat;Traction;Ultrasound;Gait training;Stair training;Functional mobility training;Therapeutic activities;DME Instruction;Therapeutic exercise;Balance training;Neuromuscular re-education;Cognitive remediation;Patient/family education;Orthotic Fit/Training;Manual techniques;Passive range of motion;Energy conservation;Taping;Vestibular   PT Next Visit Plan Clamshell; DGI, LEFS, NME for L anterior tibialis, Walk-aide, progress balance, strengthening, and HEP; Consider sidelying hip abduction exercises   PT Home Exercise Plan Sit to stand, seated tband resisted clams, seated adduction squeeze   Consulted and Agree with  Plan of Care Patient      Patient will benefit from skilled therapeutic intervention in order to improve the following deficits and impairments:  Abnormal gait, Decreased balance, Decreased coordination, Decreased mobility, Decreased range of motion, Decreased strength, Difficulty walking, Impaired tone, Impaired UE functional use  Visit Diagnosis: Muscle weakness (generalized)  Other lack of coordination  Other abnormalities of gait and mobility  Unsteadiness on feet     Problem List Patient Active Problem List   Diagnosis Date Noted  . Neurocognitive disorder   . Left foot drop   . Benign essential HTN   . Hemiparesis affecting nondominant side as late effect of cerebrovascular accident (CVA) ([place]) 07/18/2016  . Right basal ganglia embolic stroke ([place]) [id number]  . CVA (cerebral infarction) 07/15/2016  . TIA (transient ischemic attack) 07/14/2016  . HTN, goal below 140/80 07/14/2016  . Left facial numbness 07/14/2016  . Weakness of left side of body 07/14/2016    Blythe Stanford, PT DPT 08/27/2016, 11:50 AM  [place] MAIN [facility] SERVICES [address] [place], [place],  [address] Phone: [phone]   Fax:  [phone]  Name: Jason Oconnor MRN: [id number] Date of Birth: 05/18/1956

## 2016-08-29 ENCOUNTER — Ambulatory Visit: Payer: 59

## 2016-08-29 ENCOUNTER — Ambulatory Visit: Payer: 59 | Admitting: Occupational Therapy

## 2016-08-29 VITALS — BP 118/89 | HR 76

## 2016-08-29 DIAGNOSIS — R2689 Other abnormalities of gait and mobility: Secondary | ICD-10-CM

## 2016-08-29 DIAGNOSIS — M6281 Muscle weakness (generalized): Secondary | ICD-10-CM | POA: Diagnosis not present

## 2016-08-29 DIAGNOSIS — R2681 Unsteadiness on feet: Secondary | ICD-10-CM

## 2016-08-29 DIAGNOSIS — R278 Other lack of coordination: Secondary | ICD-10-CM

## 2016-08-29 NOTE — Therapy (Signed)
[place] MAIN [facility] SERVICES [address] [place], [place], [address] Phone: [phone]   Fax:  [phone]  Physical Therapy Treatment  Patient Details  Name: Jason Oconnor MRN: [id number] Date of Birth: 1956-10-11 Referring Provider: Dr. Naaman Plummer  Encounter Date: 08/29/2016      PT End of Session - 08/29/16 0852    Visit Number 7   Number of Visits 17   Date for PT Re-Evaluation 10/02/16   Authorization Type no g codes   PT Start Time 0800   PT Stop Time 0838   PT Time Calculation (min) 38 min   Equipment Utilized During Treatment Gait belt   Activity Tolerance Patient tolerated treatment well   Behavior During Therapy [facility] for tasks assessed/performed      Past Medical History:  Diagnosis Date  . Bone marrow disease   . Hypertension     History reviewed. No pertinent surgical history.  Vitals:   08/29/16 0812  BP: 118/89  Pulse: 76        Subjective Assessment - 08/29/16 0813    Subjective Patient reports less frustation with condition today and reports he's been feeling stronger but still has limited use of L LE and L UE.    Pertinent History Patient is a 60 year old right-handed male with history of hypertension. Per chart review patient is married. 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 Presented to [facility] 07/14/2016 with left-sided weakness and facial droop. Blood pressure elevated 170/106. Head CT scan negative. 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 CTA of the head showed no emergent large vessel occlusion or severe stenosis. Carotid Dopplers with no ICA stenosis. Patient did not receive TPA. Echocardiogram with ejection fraction of 60%. Normal systolic function. Placed on aspirin and Plavix for CVA prophylaxis. Subcutaneous Lovenox for  DVT prophylaxis.  Patient transferred to CIR on 07/16/2016. Pt discharged form CIR on 08/03/16. Pt reports that he made progress at CIR. No reported problems since returning home. Currently using a wide base quad cane in RUE and L AFO. Pt reports that he has made the last progress with respect to his LUE strength.    Diagnostic tests See history   Patient Stated Goals "Get back as close as I can to normal." Pt would to be able to play golf. Pt would like to return to some kind of work.    Pain Onset 1 to 4 weeks ago        TREATMENT: Therapeutic Exercise: Ball roll outs under the foot - x 58min Hamstring pull backs with YTB - 3 x 10 Dorsiflexion against 3# weight - 2 x15 Seated plantarflexion in sitting - 2 x20 against YTB to improve plantarflexion   Neuromuscular Rehab Standing Balance: Feet together, Semitandem - EC, Head turns up/down - x30sec Standing maraches on airex - 2 x 15 bilateraly  Single leg stance on airex pad - 2 x 20 sec Rocker board lateral weight shifts - x20 each side with minimal UE support Rocker board squats - x15 with UE support *patient requires cueing on LE positioning and posture        PT Education - 08/29/16 0851    Education provided Yes   Education Details HEP: educated on knee positioning during exercise   Person(s) Educated Patient   Methods Explanation;Demonstration   Comprehension Verbalized understanding;Returned demonstration  PT Long Term Goals - 08/13/16 2020      PT LONG TERM GOAL #1   Title Pt will be independent with HEP in order to improve strength and balance in order to decrease fall risk and improve function at home and work.   Time 8   Period Weeks   Status New     PT LONG TERM GOAL #2   Title Pt will improve BERG by at least 3 points in order to demonstrate clinically significant improvement in balance.   Baseline 08/07/16: 49/56   Time 8   Period Weeks   Status New     PT LONG TERM GOAL #3   Title Pt will  decrease TUG to below 14 seconds in order to demonstrate decreased fall risk    Baseline 08/07/16: 21.9 seconds   Time 8   Period Weeks   Status New     PT LONG TERM GOAL #4   Title Pt will decrease 5TSTS by at least 3 seconds in order to demonstrate clinically significant improvement in LE strength   Baseline 08/07/16: 18.5 seconds   Time 8   Period Weeks   Status New     PT LONG TERM GOAL #5   Title Pt will increase 10MWT by at least 0.13 m/s in order to demonstrate clinically significant improvement in community ambulation   Baseline 08/07/16: 0.53 m/s   Time 8   Period Weeks   Status New     Additional Long Term Goals   Additional Long Term Goals Yes     PT LONG TERM GOAL #6   Title Pt will be able to swing a golf club safely in order to complete 9 holes of golf   Time 8   Period Weeks   Status New     PT LONG TERM GOAL #7   Title Pt will improve 6MWT by at least 165ft  to demonstrate improved aerobic capacity/endurance   Baseline 582 ft   Time 7   Period Weeks   Status New               Plan - 08/29/16 [id number]    Clinical Impression Statement Focused on activating and improving ankle plantarflexion, dorsiflexion and lateral stabilization with exercises. Performed exercises without ankle orthotic to improve ankle musculature activation. Patient demonstrates improved ankle muscular activation compared to previous visits indicating functional carryover between visits. Although patient is improving, he continues to demonstrate decreased coordination and muscular endurance with exercise and will benefit from further skilled therapy to return to prior level of function.    Rehab Potential Good   Clinical Impairments Affecting Rehab Potential Positive: motivation, prior functional level; Negative: no active dorsiflexion, increased LUE tone   PT Frequency 2x / week   PT Duration 8 weeks   PT Treatment/Interventions ADLs/Self Care Home Management;Biofeedback;Canalith  Repostioning;Cryotherapy;Electrical Stimulation;Iontophoresis 4mg /ml Dexamethasone;Aquatic Therapy;Moist Heat;Traction;Ultrasound;Gait training;Stair training;Functional mobility training;Therapeutic activities;DME Instruction;Therapeutic exercise;Balance training;Neuromuscular re-education;Cognitive remediation;Patient/family education;Orthotic Fit/Training;Manual techniques;Passive range of motion;Energy conservation;Taping;Vestibular   PT Next Visit Plan Clamshell; DGI, LEFS, NME for L anterior tibialis, Walk-aide, progress balance, strengthening, and HEP; Consider sidelying hip abduction exercises   PT Home Exercise Plan Sit to stand, seated tband resisted clams, seated adduction squeeze   Consulted and Agree with Plan of Care Patient      Patient will benefit from skilled therapeutic intervention in order to improve the following deficits and impairments:  Abnormal gait, Decreased balance, Decreased coordination, Decreased mobility, Decreased range of motion, Decreased strength, Difficulty walking,  Impaired tone, Impaired UE functional use  Visit Diagnosis: Unsteadiness on feet  Muscle weakness (generalized)  Other lack of coordination  Other abnormalities of gait and mobility     Problem List Patient Active Problem List   Diagnosis Date Noted  . Neurocognitive disorder   . Left foot drop   . Benign essential HTN   . Hemiparesis affecting nondominant side as late effect of cerebrovascular accident (CVA) ([place]) 07/18/2016  . Right basal ganglia embolic stroke ([place]) [id number]  . CVA (cerebral infarction) 07/15/2016  . TIA (transient ischemic attack) 07/14/2016  . HTN, goal below 140/80 07/14/2016  . Left facial numbness 07/14/2016  . Weakness of left side of body 07/14/2016    Blythe Stanford, PT DPT 08/29/2016, 8:58 AM  [place] MAIN [facility] SERVICES [address] [place], [place], [address] Phone: [phone]   Fax:   [phone]  Name: Jason Oconnor MRN: [id number] Date of Birth: 20-Aug-1956

## 2016-08-30 ENCOUNTER — Encounter: Payer: Self-pay | Admitting: Occupational Therapy

## 2016-08-30 NOTE — Therapy (Signed)
[place] MAIN [facility] SERVICES [address] [place], [place], [address] Phone: [phone]   Fax:  [phone]  Occupational Therapy Treatment  Patient Details  Name: Jason Oconnor MRN: [id number] Date of Birth: [birth year] No Data Recorded  Encounter Date: 08/27/2016      OT End of Session - 08/30/16 1011    Visit Number 6   Number of Visits 24   OT Start Time 0845   OT Stop Time 0930   OT Time Calculation (min) 45 min   Activity Tolerance Patient tolerated treatment well   Behavior During Therapy [facility] for tasks assessed/performed      Past Medical History:  Diagnosis Date  . Bone marrow disease   . Hypertension     History reviewed. No pertinent surgical history.  There were no vitals filed for this visit.      Subjective Assessment - 08/30/16 1006    Subjective  Patient reports he is doing as much as he can to use his hand, wants to get well ASAP and wants to go back to work.     Pertinent History Patient reports on July 13, 2016 he was helping a friend doing home repairs and felt tired, went home and started to notice problems moving around.  The next morning he feel trying to put his slippers on, wife transported him to ER and [facility] for 3 days and then went to [facility] for REhab and was discharged this past Saturday to home.     Patient Stated Goals "I want to get back to close as normal as possible, play golf again, work in the garage."     Currently in Pain? Yes   Pain Score 2    Pain Location Shoulder   Pain Orientation Left   Pain Descriptors / Indicators Aching   Pain Type Acute pain   Pain Frequency Intermittent   Multiple Pain Sites No                      OT Treatments/Exercises (OP) - 08/30/16 1008      Fine Motor Coordination   Other Fine Motor Exercises Patient seen for fine motor coordination tasks with left hand for picking up round objects, cues to turn objects in hand to place into specified  slot, difficulty with isolated finger movements to turn.        Neurological Re-education Exercises   Other Exercises 1 Patient seen for shoulder stabilization exercises in supine for left shoulder for shoulder flexion to 90 degrees, place and hold for 10 secs each for 10 reps for 1 sets, shoulder protraction for 10 reps for 1 sets, shoulder at 90 degrees of flexion with isolated elbow flexion to opposite shoulder and return with triceps press 2 sets of 10 reps. Same exercise except to forehead and up 2 sets 10 reps. ROM holding medium ball with bilateral UE holding ball with hands in neutral position for shoulder flexion for 2 sets of 10 reps, chest press for 2 sets 10 reps. Added place and hold with external perturbations this date to left UE for 20 secs for 10 reps each.  Alphabet exercise for 1 set from A-Z. All exercises performed with cues for quality of movements, control, isolated muscle targeting and guiding as needed from therapist. Rest breaks as needed between sets.     Manual Therapy   Manual Therapy Joint mobilization   Manual therapy comments LUE shoulder joint mobilization glenohumeral for inferior and  posterior mobs, Grade III prior to ROM to decrease pain and increase ROM. S                OT Education - 08/30/16 1011    Education provided Yes   Education Details HEP, ROM, LUE functional tasks.   Person(s) Educated Patient   Methods Explanation;Demonstration;Verbal cues   Comprehension Verbal cues required;Returned demonstration;Verbalized understanding             OT Long Term Goals - 08/09/16 1340      OT LONG TERM GOAL #1   Title Patient to improve left UE coordination sufficient to tie shoes with modified independence.    Baseline unable   Time 12   Period Weeks   Status New     OT LONG TERM GOAL #2   Title Patient to improve LUE coordination to complete buttons, snaps and zippers on clothing with modified independence.   Time 12   Period Weeks    Status New     OT LONG TERM GOAL #3   Title Patient to complete bathing with modified independence including using left arm to wash right side of body.    Time 12   Period Weeks   Status New     OT LONG TERM GOAL #4   Title Patient to improve left hand use to type on computer using both hands on home row keys, 2 lines with no errors in 5 minutes.    Baseline unable   Time 12   Period Weeks   Status New     OT LONG TERM GOAL #5   Title Patient will improve grip on left hand by 10# to stabilize jars and containers for opening.    Baseline unable   Time 12   Period Weeks   Status New     Long Term Additional Goals   Additional Long Term Goals Yes     OT LONG TERM GOAL #6   Title Patient will be able to demonstrate sweeping with use of bilateral UEs with modified independence.    Baseline unable   Time 12               Plan - 08/30/16 1012    Clinical Impression Statement Patient reports he was hoping to have more movement in his left arm by this time, feels his progress is slow and it is difficult to be patient.  He is improving with movement of the left arm, able to advance to place and hold with external perturbations this date with good form and success.  He is consistent at home with using his left hand daily with home program and functional tasks.  Will continue to work towards goals to improve independence in daily tasks.    Rehab Potential Good   OT Frequency 2x / week   OT Duration 12 weeks   OT Treatment/Interventions Self-care/ADL training;Therapeutic exercise;Neuromuscular education;Visual/perceptual remediation/compensation;Therapist, nutritional;Therapeutic exercises;Patient/family education;DME and/or AE instruction;Manual Therapy;Therapeutic activities;Balance training   Consulted and Agree with Plan of Care Patient      Patient will benefit from skilled therapeutic intervention in order to improve the following deficits and impairments:  Decreased  knowledge of use of DME, Decreased coordination, Impaired sensation, Decreased activity tolerance, Decreased range of motion, Decreased strength, Decreased balance, Difficulty walking, Impaired UE functional use  Visit Diagnosis: Muscle weakness (generalized)  Other lack of coordination    Problem List Patient Active Problem List   Diagnosis Date Noted  . Neurocognitive disorder   .  Left foot drop   . Benign essential HTN   . Hemiparesis affecting nondominant side as late effect of cerebrovascular accident (CVA) ([place]) 07/18/2016  . Right basal ganglia embolic stroke ([place]) [id number]  . CVA (cerebral infarction) 07/15/2016  . TIA (transient ischemic attack) 07/14/2016  . HTN, goal below 140/80 07/14/2016  . Left facial numbness 07/14/2016  . Weakness of left side of body 07/14/2016   Derl Abalos Oneita Jolly, OTR/L, CLT  Mabelle Mungin 08/30/2016, 10:15 AM  [place] MAIN [facility] SERVICES [address] [place], [place], [address] Phone: [phone]   Fax:  [phone]  Name: Jason Oconnor MRN: [id number] Date of Birth: 1956-06-27

## 2016-08-31 ENCOUNTER — Encounter: Payer: Self-pay | Admitting: Occupational Therapy

## 2016-08-31 NOTE — Therapy (Signed)
[place] MAIN [facility] SERVICES [address] [place], [place], [address] Phone: [phone]   Fax:  [phone]  Occupational Therapy Treatment  Patient Details  Name: Jason Oconnor MRN: [id number] Date of Birth: May 27, 1956 No Data Recorded  Encounter Date: 08/29/2016      OT End of Session - 08/31/16 2148    Visit Number 7   Number of Visits 24   Date for OT Re-Evaluation 11/01/16   OT Start Time 0844   OT Stop Time 0922   OT Time Calculation (min) 38 min   Activity Tolerance Patient tolerated treatment well   Behavior During Therapy [facility] for tasks assessed/performed      Past Medical History:  Diagnosis Date  . Bone marrow disease   . Hypertension     History reviewed. No pertinent surgical history.  There were no vitals filed for this visit.      Subjective Assessment - 08/31/16 2135    Subjective  Patient reports he is doing well, no complaints, feels the edema in his hand is less today.     Pertinent History Patient reports on July 13, 2016 he was helping a friend doing home repairs and felt tired, went home and started to notice problems moving around.  The next morning he feel trying to put his slippers on, wife transported him to ER and [facility] for 3 days and then went to [facility] for REhab and was discharged this past Saturday to home.     Patient Stated Goals "I want to get back to close as normal as possible, play golf again, work in the garage."     Currently in Pain? Yes   Pain Score 2    Pain Location Shoulder   Pain Orientation Left   Pain Descriptors / Indicators Aching   Pain Type Acute pain   Pain Onset 1 to 4 weeks ago   Pain Frequency Intermittent   Multiple Pain Sites No                      OT Treatments/Exercises (OP) - 08/31/16 2136      Fine Motor Coordination   Other Fine Motor Exercises Patient seen for fine motor coordination tasks with left hand for picking up checkers, cues to turn  objects in hand to place into specified slot, difficulty with isolated finger movements to turn, incorporated reach into task.        Neurological Re-education Exercises   Other Exercises 1 Patient seen for shoulder stabilization exercises in supine for left shoulder for shoulder flexion to 90 degrees, place and hold for 10 secs each for 10 reps for 1 sets, shoulder protraction for 10 reps for 1 sets, shoulder at 90 degrees of flexion with isolated elbow flexion to opposite shoulder and return with triceps press 2 sets of 10 reps. Same exercise except to forehead and up 2 sets 10 reps. ROM holding medium ball with bilateral UE holding ball with hands in neutral position for shoulder flexion for 2 sets of 10 reps, chest press for 2 sets 10 reps. Added place and hold with external perturbations this date to left UE for 20 secs for 10 reps each. Alphabet exercise for 1 set from A-Z. All exercises performed with cues for quality of movements, control, isolated muscle targeting and guiding as needed from therapist. Rest breaks as needed between sets.   Other Exercises 2 UE ranger in standing for shoulder flexion, ABD/ADD and small circles  for 10 reps each with therapist providing hand over hand guiding and assistance.      Manual Therapy   Manual Therapy Joint mobilization   Manual therapy comments LUE shoulder joint mobilization glenohumeral for inferior and posterior mobs, Grade III prior to ROM to decrease pain and increase ROM. S                OT Education - 08/31/16 2148    Education provided Yes   Education Details HEP, neutral hand position   Person(s) Educated Patient   Methods Explanation;Demonstration;Verbal cues   Comprehension Verbalized understanding;Returned demonstration;Verbal cues required             OT Long Term Goals - 08/09/16 1340      OT LONG TERM GOAL #1   Title Patient to improve left UE coordination sufficient to tie shoes with modified independence.     Baseline unable   Time 12   Period Weeks   Status New     OT LONG TERM GOAL #2   Title Patient to improve LUE coordination to complete buttons, snaps and zippers on clothing with modified independence.   Time 12   Period Weeks   Status New     OT LONG TERM GOAL #3   Title Patient to complete bathing with modified independence including using left arm to wash right side of body.    Time 12   Period Weeks   Status New     OT LONG TERM GOAL #4   Title Patient to improve left hand use to type on computer using both hands on home row keys, 2 lines with no errors in 5 minutes.    Baseline unable   Time 12   Period Weeks   Status New     OT LONG TERM GOAL #5   Title Patient will improve grip on left hand by 10# to stabilize jars and containers for opening.    Baseline unable   Time 12   Period Weeks   Status New     Long Term Additional Goals   Additional Long Term Goals Yes     OT LONG TERM GOAL #6   Title Patient will be able to demonstrate sweeping with use of bilateral UEs with modified independence.    Baseline unable   Time 12               Plan - 08/31/16 2150    Clinical Impression Statement Patient continues to progress with UE exercises, ROM and working towards increased functional use.  Patient is able to demo home exercises and continuing to add to program.  Cues to decrease compensatory movements with reach.     Rehab Potential Good   OT Frequency 2x / week   OT Duration 12 weeks   OT Treatment/Interventions Self-care/ADL training;Therapeutic exercise;Neuromuscular education;Visual/perceptual remediation/compensation;Therapist, nutritional;Therapeutic exercises;Patient/family education;DME and/or AE instruction;Manual Therapy;Therapeutic activities;Balance training   Consulted and Agree with Plan of Care Patient      Patient will benefit from skilled therapeutic intervention in order to improve the following deficits and impairments:  Decreased  knowledge of use of DME, Decreased coordination, Impaired sensation, Decreased activity tolerance, Decreased range of motion, Decreased strength, Decreased balance, Difficulty walking, Impaired UE functional use  Visit Diagnosis: Muscle weakness (generalized)  Other lack of coordination    Problem List Patient Active Problem List   Diagnosis Date Noted  . Neurocognitive disorder   . Left foot drop   . Benign essential HTN   .  Hemiparesis affecting nondominant side as late effect of cerebrovascular accident (CVA) ([place]) 07/18/2016  . Right basal ganglia embolic stroke ([place]) 123XX123  . CVA (cerebral infarction) 07/15/2016  . TIA (transient ischemic attack) 07/14/2016  . HTN, goal below 140/80 07/14/2016  . Left facial numbness 07/14/2016  . Weakness of left side of body 07/14/2016   Mel Tadros Oneita Jolly, OTR/L, CLT  Nivin Braniff 08/31/2016, 9:54 PM  [place] MAIN [facility] SERVICES [address] [place], [place], [address] Phone: [phone]   Fax:  [phone]  Name: Jason Oconnor MRN: [id number] Date of Birth: May 22, 1956

## 2016-09-03 ENCOUNTER — Ambulatory Visit: Payer: 59

## 2016-09-03 ENCOUNTER — Encounter: Payer: 59 | Admitting: Occupational Therapy

## 2016-09-05 ENCOUNTER — Ambulatory Visit: Payer: 59 | Admitting: Physical Therapy

## 2016-09-05 ENCOUNTER — Ambulatory Visit: Payer: 59 | Admitting: Occupational Therapy

## 2016-09-05 DIAGNOSIS — M6281 Muscle weakness (generalized): Secondary | ICD-10-CM

## 2016-09-05 DIAGNOSIS — R278 Other lack of coordination: Secondary | ICD-10-CM

## 2016-09-05 DIAGNOSIS — R2681 Unsteadiness on feet: Secondary | ICD-10-CM

## 2016-09-05 NOTE — Therapy (Signed)
[place] MAIN [facility] SERVICES [address] [place], [place], [address] Phone: [phone]   Fax:  [phone]  Physical Therapy Treatment  Patient Details  Name: Jason Oconnor MRN: [id number] Date of Birth: Apr 30, 1956 Referring Provider: Dr. Naaman Plummer  Encounter Date: 09/05/2016      PT End of Session - 09/05/16 0844    Visit Number 8   Number of Visits 17   Date for PT Re-Evaluation 10/02/16   Authorization Type no g codes   PT Start Time 0830   PT Stop Time 0910   PT Time Calculation (min) 40 min   Equipment Utilized During Treatment Gait belt   Activity Tolerance Patient tolerated treatment well   Behavior During Therapy [facility] for tasks assessed/performed      Past Medical History:  Diagnosis Date  . Bone marrow disease   . Hypertension     No past surgical history on file.  There were no vitals filed for this visit.      Subjective Assessment - 09/05/16 0843    Subjective Patient says that he is walking in his house without cane and sometimes without L ankle brace.    Currently in Pain? No/denies   Pain Score 0-No pain      NMR and therapeutic exercise: TM walking at 1. 0 miles/ hour with elevation of 1 x 10 minutes Leg press 120 lbs x 20 x 3 sets BLE  Ascending/ descending steps without railing and CGA and foot catching on and off the step w/ AFO BAPS board level 2 with DF/PF x 20 alternating left foot fwd and bwd , CCW, CW x 20 , AFO loose to allow less control provided with brace Matrix fwd/ bwd x 5, side stepping x 3 with  Min assist  Patient needs occasional verbal cueing to improve posture and cueing to correctly perform exercises slowly, holding at end of range to increase motor firing of desired muscle to encourage fatigue.                           PT Education - 09/05/16 0844    Education provided Yes   Education Details HEP   Person(s) Educated Patient   Comprehension Verbalized  understanding             PT Long Term Goals - 08/13/16 2020      PT LONG TERM GOAL #1   Title Pt will be independent with HEP in order to improve strength and balance in order to decrease fall risk and improve function at home and work.   Time 8   Period Weeks   Status New     PT LONG TERM GOAL #2   Title Pt will improve BERG by at least 3 points in order to demonstrate clinically significant improvement in balance.   Baseline 08/07/16: 49/56   Time 8   Period Weeks   Status New     PT LONG TERM GOAL #3   Title Pt will decrease TUG to below 14 seconds in order to demonstrate decreased fall risk    Baseline 08/07/16: 21.9 seconds   Time 8   Period Weeks   Status New     PT LONG TERM GOAL #4   Title Pt will decrease 5TSTS by at least 3 seconds in order to demonstrate clinically significant improvement in LE strength   Baseline 08/07/16: 18.5 seconds   Time 8   Period Weeks  Status New     PT LONG TERM GOAL #5   Title Pt will increase 10MWT by at least 0.13 m/s in order to demonstrate clinically significant improvement in community ambulation   Baseline 08/07/16: 0.53 m/s   Time 8   Period Weeks   Status New     Additional Long Term Goals   Additional Long Term Goals Yes     PT LONG TERM GOAL #6   Title Pt will be able to swing a golf club safely in order to complete 9 holes of golf   Time 8   Period Weeks   Status New     PT LONG TERM GOAL #7   Title Pt will improve 6MWT by at least 166ft  to demonstrate improved aerobic capacity/endurance   Baseline 582 ft   Time 7   Period Weeks   Status New               Plan - 09/05/16 0849    Clinical Impression Statement Patient continues to have weakness in LLE ankle and decreased dynamic standing balance and ambulates with quad cane.    Rehab Potential Good   Clinical Impairments Affecting Rehab Potential Positive: motivation, prior functional level; Negative: no active dorsiflexion, increased LUE tone    PT Frequency 2x / week   PT Duration 8 weeks   PT Treatment/Interventions ADLs/Self Care Home Management;Biofeedback;Canalith Repostioning;Cryotherapy;Electrical Stimulation;Iontophoresis 4mg /ml Dexamethasone;Aquatic Therapy;Moist Heat;Traction;Ultrasound;Gait training;Stair training;Functional mobility training;Therapeutic activities;DME Instruction;Therapeutic exercise;Balance training;Neuromuscular re-education;Cognitive remediation;Patient/family education;Orthotic Fit/Training;Manual techniques;Passive range of motion;Energy conservation;Taping;Vestibular   PT Next Visit Plan [place]; DGI, LEFS, NME for L anterior tibialis, Walk-aide, progress balance, strengthening, and HEP; Consider sidelying hip abduction exercises   PT Home Exercise Plan Sit to stand, seated tband resisted clams, seated adduction squeeze   Consulted and Agree with Plan of Care Patient      Patient will benefit from skilled therapeutic intervention in order to improve the following deficits and impairments:  Abnormal gait, Decreased balance, Decreased coordination, Decreased mobility, Decreased range of motion, Decreased strength, Difficulty walking, Impaired tone, Impaired UE functional use  Visit Diagnosis: Muscle weakness (generalized)  Other lack of coordination  Unsteadiness on feet     Problem List Patient Active Problem List   Diagnosis Date Noted  . Neurocognitive disorder   . Left foot drop   . Benign essential HTN   . Hemiparesis affecting nondominant side as late effect of cerebrovascular accident (CVA) ([place]) 07/18/2016  . Right basal ganglia embolic stroke ([place]) [id number]  . CVA (cerebral infarction) 07/15/2016  . TIA (transient ischemic attack) 07/14/2016  . HTN, goal below 140/80 07/14/2016  . Left facial numbness 07/14/2016  . Weakness of left side of body 07/14/2016    Alanson Puls 09/05/2016, 8:55 AM  [place] MAIN [facility] SERVICES [address] [place], [place], [address] Phone: [phone]   Fax:  [phone]  Name: Jason Oconnor MRN: [id number] Date of Birth: 1956/03/27

## 2016-09-06 ENCOUNTER — Encounter: Payer: Self-pay | Admitting: Occupational Therapy

## 2016-09-06 NOTE — Therapy (Signed)
[place] MAIN [facility] SERVICES [address] [place], [place], [address] Phone: [phone]   Fax:  [phone]  Occupational Therapy Treatment  Patient Details  Name: Jason Oconnor MRN: [id number] Date of Birth: Jan 03, 1956 No Data Recorded  Encounter Date: 09/05/2016      OT End of Session - 09/06/16 1012    Visit Number 8   Number of Visits 24   Date for OT Re-Evaluation 11/01/16   OT Start Time 0915   OT Stop Time 1000   OT Time Calculation (min) 45 min   Activity Tolerance Patient tolerated treatment well   Behavior During Therapy [facility] for tasks assessed/performed      Past Medical History:  Diagnosis Date  . Bone marrow disease   . Hypertension     History reviewed. No pertinent surgical history.  There were no vitals filed for this visit.      Subjective Assessment - 09/06/16 1009    Subjective  Patient reports he can tell he is making improvements but would like to the changes to be quicker, he really wants to get back to being more independent and be rid of shoulder pain on the left which is worse at night especially if he tries to sleep on his left side.     Pertinent History Patient reports on July 13, 2016 he was helping a friend doing home repairs and felt tired, went home and started to notice problems moving around.  The next morning he feel trying to put his slippers on, wife transported him to ER and [facility] for 3 days and then went to [facility] for REhab and was discharged this past Saturday to home.     Patient Stated Goals "I want to get back to close as normal as possible, play golf again, work in the garage."     Currently in Pain? Yes   Pain Score 2    Pain Location Shoulder   Pain Orientation Left   Pain Descriptors / Indicators Aching   Pain Type Acute pain   Pain Onset 1 to 4 weeks ago   Pain Frequency Intermittent   Aggravating Factors  pain towards flexion above 110 degrees prior to tx. no pain reported  after treatment   Multiple Pain Sites No                      OT Treatments/Exercises (OP) - 09/05/16 1014      Fine Motor Coordination   Other Fine Motor Exercises Patient seen for fine motor coordination tasks with left hand for picking up checkers, cues to turn objects in hand to place into specified slot, difficulty with isolated finger movements to turn, incorporated reach into task.        Neurological Re-education Exercises   Other Exercises 1 Patient seen for shoulder stabilization exercises in supine for left shoulder for shoulder flexion to 90 degrees, place and hold for 10 secs each for 10 reps for 1 sets, external perturbations, shoulder protraction for 10 reps for 1 sets, shoulder at 90 degrees of flexion with isolated elbow flexion to opposite shoulder and return with triceps press 2 sets of 10 reps. ROM holding medium ball with bilateral UE holding ball with hands in neutral position for shoulder flexion for 2 sets of 10 reps, chest press for 2 sets 10 reps.   Shoulder stabilization exercises with left arm at 90 degrees of flexion in supine with movements of drawing plus sign (down  and across left), the reverse (up and across right).Reciprocal arm movements with holding wooden sticks, when left arm raises, right arm lowers and then switch with focus on quality of movement.  All exercises performed with cues for quality of movements, control, isolated muscle targeting and guiding as needed from therapist. Rest breaks as needed between sets.   Other Exercises 2 Saebo ball grasp and release, followed by ball toss in standing with targets, varying distances from patient, cues provided.  Patient able to achieve 75% accuracy with 1st set, % second set.     Manual Therapy   Manual Therapy Joint mobilization   Manual therapy comments LUE shoulder joint mobilization glenohumeral for inferior and posterior mobs, Grade III prior to ROM to decrease pain and increase ROM. S                 OT Education - 09/06/16 1011    Education Details HEP for ROM, strength and coordination tasks with use of left UE   Person(s) Educated Patient   Methods Explanation;Demonstration;Verbal cues   Comprehension Verbal cues required;Returned demonstration;Verbalized understanding             OT Long Term Goals - 08/09/16 1340      OT LONG TERM GOAL #1   Title Patient to improve left UE coordination sufficient to tie shoes with modified independence.    Baseline unable   Time 12   Period Weeks   Status New     OT LONG TERM GOAL #2   Title Patient to improve LUE coordination to complete buttons, snaps and zippers on clothing with modified independence.   Time 12   Period Weeks   Status New     OT LONG TERM GOAL #3   Title Patient to complete bathing with modified independence including using left arm to wash right side of body.    Time 12   Period Weeks   Status New     OT LONG TERM GOAL #4   Title Patient to improve left hand use to type on computer using both hands on home row keys, 2 lines with no errors in 5 minutes.    Baseline unable   Time 12   Period Weeks   Status New     OT LONG TERM GOAL #5   Title Patient will improve grip on left hand by 10# to stabilize jars and containers for opening.    Baseline unable   Time 12   Period Weeks   Status New     Long Term Additional Goals   Additional Long Term Goals Yes     OT LONG TERM GOAL #6   Title Patient will be able to demonstrate sweeping with use of bilateral UEs with modified independence.    Baseline unable   Time 12               Plan - 09/06/16 1012    Clinical Impression Statement Patient continues to have some mild shoulder pain with shoulder flexion prior to tx however, after brief joint mobilizations and neuro reeducation, pain diminishes.  He is able to perform exercises in greater repetitions and quality of movements and beginning to transition from some exercises in  supine to exercises in sitting and standing. Continue to work towards goals to improve LUE function for all daily tasks.    Rehab Potential Good   OT Frequency 2x / week   OT Duration 12 weeks   OT Treatment/Interventions Self-care/ADL training;Therapeutic exercise;Neuromuscular  education;Visual/perceptual remediation/compensation;Therapist, nutritional;Therapeutic exercises;Patient/family education;DME and/or AE instruction;Manual Therapy;Therapeutic activities;Balance training   Consulted and Agree with Plan of Care Patient      Patient will benefit from skilled therapeutic intervention in order to improve the following deficits and impairments:  Decreased knowledge of use of DME, Decreased coordination, Impaired sensation, Decreased activity tolerance, Decreased range of motion, Decreased strength, Decreased balance, Difficulty walking, Impaired UE functional use  Visit Diagnosis: Muscle weakness (generalized)  Other lack of coordination    Problem List Patient Active Problem List   Diagnosis Date Noted  . Neurocognitive disorder   . Left foot drop   . Benign essential HTN   . Hemiparesis affecting nondominant side as late effect of cerebrovascular accident (CVA) ([place]) 07/18/2016  . Right basal ganglia embolic stroke ([place]) 123XX123  . CVA (cerebral infarction) 07/15/2016  . TIA (transient ischemic attack) 07/14/2016  . HTN, goal below 140/80 07/14/2016  . Left facial numbness 07/14/2016  . Weakness of left side of body 07/14/2016   [name] Oneita Jolly, OTR/L, CLT  [name],[name] 09/06/2016, 10:22 AM  [place] MAIN [facility] SERVICES [address] [place], [place], [address] Phone: [phone]   Fax:  [phone]  Name: Jason Oconnor MRN: [id number] Date of Birth: Dec 10, 1955

## 2016-09-10 ENCOUNTER — Ambulatory Visit: Payer: 59 | Admitting: Occupational Therapy

## 2016-09-10 ENCOUNTER — Ambulatory Visit: Payer: 59

## 2016-09-10 ENCOUNTER — Ambulatory Visit: Payer: 59 | Admitting: Physical Medicine & Rehabilitation

## 2016-09-10 VITALS — BP 136/88

## 2016-09-10 DIAGNOSIS — M6281 Muscle weakness (generalized): Secondary | ICD-10-CM

## 2016-09-10 DIAGNOSIS — R278 Other lack of coordination: Secondary | ICD-10-CM

## 2016-09-10 DIAGNOSIS — R2681 Unsteadiness on feet: Secondary | ICD-10-CM

## 2016-09-10 NOTE — Therapy (Signed)
[place] MAIN [facility] SERVICES [address] [place], [place], [address] Phone: [phone]   Fax:  [phone]  Physical Therapy Treatment  Patient Details  Name: Jason Oconnor MRN: [id number] Date of Birth: 07-08-1956 Referring Provider: Dr. Naaman Plummer  Encounter Date: 09/10/2016      PT End of Session - 09/10/16 0851    Visit Number 9   Number of Visits 17   Date for PT Re-Evaluation 10/02/16   Authorization Type no g codes   PT Start Time 0802   PT Stop Time 0845   PT Time Calculation (min) 43 min   Equipment Utilized During Treatment Gait belt   Activity Tolerance Patient tolerated treatment well   Behavior During Therapy [facility] for tasks assessed/performed      Past Medical History:  Diagnosis Date  . Bone marrow disease   . Hypertension     History reviewed. No pertinent surgical history.  Vitals:   09/10/16 0848  BP: 136/88        Subjective Assessment - 09/10/16 0818    Subjective Patient states he feels about the same since the previous visit. Reports he continues to have difficulty with lifting his foot.   Pertinent History Patient is a 60 year old right-handed male with history of hypertension. Per chart review patient is married. 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 Presented to [facility] 07/14/2016 with left-sided weakness and facial droop. Blood pressure elevated 170/106. Head CT scan negative. 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 CTA of the head showed no emergent large vessel occlusion or severe stenosis. Carotid Dopplers with no ICA stenosis. Patient did not receive TPA. Echocardiogram with ejection fraction of 60%. Normal systolic function. Placed on aspirin and Plavix for CVA prophylaxis. Subcutaneous Lovenox for DVT prophylaxis.   Patient transferred to CIR on 07/16/2016. Pt discharged form CIR on 08/03/16. Pt reports that he made progress at CIR. No reported problems since returning home. Currently using a wide base quad cane in RUE and L AFO. Pt reports that he has made the last progress with respect to his LUE strength.    Patient Stated Goals "Get back as close as I can to normal." Pt would to be able to play golf. Pt would like to return to some kind of work.    Currently in Pain? No/denies   Pain Score 0-No pain      Therapeutic exercise: Leg press 120 lbs x 20 x 3 sets LLE  Ascending/ descending steps without railing and CGA and foot catching on and off the step w/ AFO BAPS board level 2 with DF/PF x 20 alternating left foot fwd and bwd , CCW, CW x 20, forward/backward AFO loose to allow less control provided with brace Seated ball roll out tfor hamstring motor control and improving muscular endurance - x 65min Stool scoots with emphasis on L LE hamstring activation - 57ft x 2    Patient needs occasional verbal cueing to improve posture and cueing to correctly perform exercises slowly, holding at end of range to increase motor firing of desired muscle to encourage fatigue. Modalities NMES pulse width 380uS, Intensity 44, Rate: 50Hz , waveform: asymmetrical 10 minutes total time. 10 sec on/ 30 sec off against a 10 # weight with patient in sitting to improve motor recruitment.        PT Education -  09/10/16 0851    Education provided Yes   Education Details Educated on electrophysiology   Person(s) Educated Patient   Methods Explanation   Comprehension Verbalized understanding             PT Long Term Goals - 08/13/16 2020      PT LONG TERM GOAL #1   Title Pt will be independent with HEP in order to improve strength and balance in order to decrease fall risk and improve function at home and work.   Time 8   Period Weeks   Status New     PT LONG TERM GOAL #2   Title Pt will improve BERG by at least 3  points in order to demonstrate clinically significant improvement in balance.   Baseline 08/07/16: 49/56   Time 8   Period Weeks   Status New     PT LONG TERM GOAL #3   Title Pt will decrease TUG to below 14 seconds in order to demonstrate decreased fall risk    Baseline 08/07/16: 21.9 seconds   Time 8   Period Weeks   Status New     PT LONG TERM GOAL #4   Title Pt will decrease 5TSTS by at least 3 seconds in order to demonstrate clinically significant improvement in LE strength   Baseline 08/07/16: 18.5 seconds   Time 8   Period Weeks   Status New     PT LONG TERM GOAL #5   Title Pt will increase 10MWT by at least 0.13 m/s in order to demonstrate clinically significant improvement in community ambulation   Baseline 08/07/16: 0.53 m/s   Time 8   Period Weeks   Status New     Additional Long Term Goals   Additional Long Term Goals Yes     PT LONG TERM GOAL #6   Title Pt will be able to swing a golf club safely in order to complete 9 holes of golf   Time 8   Period Weeks   Status New     PT LONG TERM GOAL #7   Title Pt will improve 6MWT by at least 116ft  to demonstrate improved aerobic capacity/endurance   Baseline 582 ft   Time 7   Period Weeks   Status New               Plan - 09/10/16 [id number]    Clinical Impression Statement Performed tibialis anterior activation with NMES unit to improve recruitment of motor units and increase strength to allow for improved amb. Patient tolerated treatment well with noteable improvement with BAPS board activities indicating improved control and pt will benefit from further skilled therapy to return to prior level of function.    Rehab Potential Good   Clinical Impairments Affecting Rehab Potential Positive: motivation, prior functional level; Negative: no active dorsiflexion, increased LUE tone   PT Frequency 2x / week   PT Duration 8 weeks   PT Treatment/Interventions ADLs/Self Care Home Management;Biofeedback;Canalith  Repostioning;Cryotherapy;Electrical Stimulation;Iontophoresis 4mg /ml Dexamethasone;Aquatic Therapy;Moist Heat;Traction;Ultrasound;Gait training;Stair training;Functional mobility training;Therapeutic activities;DME Instruction;Therapeutic exercise;Balance training;Neuromuscular re-education;Cognitive remediation;Patient/family education;Orthotic Fit/Training;Manual techniques;Passive range of motion;Energy conservation;Taping;Vestibular   PT Next Visit Plan Clamshell; DGI, LEFS, NME for L anterior tibialis, Walk-aide, progress balance, strengthening, and HEP; Consider sidelying hip abduction exercises   PT Home Exercise Plan Sit to stand, seated tband resisted clams, seated adduction squeeze   Consulted and Agree with Plan of Care Patient      Patient will benefit from skilled therapeutic intervention in order  to improve the following deficits and impairments:  Abnormal gait, Decreased balance, Decreased coordination, Decreased mobility, Decreased range of motion, Decreased strength, Difficulty walking, Impaired tone, Impaired UE functional use  Visit Diagnosis: Muscle weakness (generalized)  Other lack of coordination  Unsteadiness on feet     Problem List Patient Active Problem List   Diagnosis Date Noted  . Neurocognitive disorder   . Left foot drop   . Benign essential HTN   . Hemiparesis affecting nondominant side as late effect of cerebrovascular accident (CVA) ([place]) 07/18/2016  . Right basal ganglia embolic stroke ([place]) 123XX123  . CVA (cerebral infarction) 07/15/2016  . TIA (transient ischemic attack) 07/14/2016  . HTN, goal below 140/80 07/14/2016  . Left facial numbness 07/14/2016  . Weakness of left side of body 07/14/2016    Blythe Stanford, PT DPT 09/10/2016, 8:56 AM  [place] MAIN [facility] SERVICES [address] [place], [place], [address] Phone: [phone]   Fax:  [phone]  Name: Jason Oconnor MRN: [id number] Date of  Birth: [birth year]

## 2016-09-10 NOTE — Therapy (Signed)
[place] MAIN [facility] SERVICES [address] [place], [place], [address] Phone: [phone]   Fax:  [phone]  Occupational Therapy Treatment  Patient Details  Name: Jason Oconnor MRN: [id number] Date of Birth: 04/27/1956 No Data Recorded  Encounter Date: 09/10/2016      OT End of Session - 09/10/16 0947    Visit Number 9   Number of Visits 24   Date for OT Re-Evaluation 11/01/16   OT Start Time 0845   OT Stop Time 0930   OT Time Calculation (min) 45 min   Activity Tolerance Patient tolerated treatment well   Behavior During Therapy [facility] for tasks assessed/performed      Past Medical History:  Diagnosis Date  . Bone marrow disease   . Hypertension     No past surgical history on file.  There were no vitals filed for this visit.      Subjective Assessment - 09/10/16 0939    Subjective  Pt. reports his wife is trying to help with left hand swelling, however his hand has increased edema today.   Pertinent History Patient reports on July 13, 2016 he was helping a friend doing home repairs and felt tired, went home and started to notice problems moving around.  The next morning he feel trying to put his slippers on, wife transported him to ER and [facility] for 3 days and then went to [facility] for REhab and was discharged this past Saturday to home.     Currently in Pain? No/denies   Pain Score 0-No pain      OT TREATMENT    Neuro muscular re-education:  Pt. Worked on motor control and coordination skills using a wooden dowel alternating with right, and left. Pt. Worked on gross grasp/release strategies throwing 4" balls at a specific target.  Therapeutic Exercise:  Pt. Worked on supine LUE exercises at shoulder height with elbow extended. Pt. Worked on stabilization exercises, the alphabet, protraction exercises. Pt. Worked on bilateral shoulder flexion exercises with a green ball, and chest press exercises with a 1.5#  dowel.  Manual Therapy:  Pt. Tolerated retrograde massage for edema control with LUE in elevation, joint and soft tissue mobes at carpals and metacarpals, carpal rolls, and metacarpal spread stretches.                                  OT Long Term Goals - 08/09/16 1340      OT LONG TERM GOAL #1   Title Patient to improve left UE coordination sufficient to tie shoes with modified independence.    Baseline unable   Time 12   Period Weeks   Status New     OT LONG TERM GOAL #2   Title Patient to improve LUE coordination to complete buttons, snaps and zippers on clothing with modified independence.   Time 12   Period Weeks   Status New     OT LONG TERM GOAL #3   Title Patient to complete bathing with modified independence including using left arm to wash right side of body.    Time 12   Period Weeks   Status New     OT LONG TERM GOAL #4   Title Patient to improve left hand use to type on computer using both hands on home row keys, 2 lines with no errors in 5 minutes.    Baseline unable   Time 12  Period Weeks   Status New     OT LONG TERM GOAL #5   Title Patient will improve grip on left hand by 10# to stabilize jars and containers for opening.    Baseline unable   Time 12   Period Weeks   Status New     Long Term Additional Goals   Additional Long Term Goals Yes     OT LONG TERM GOAL #6   Title Patient will be able to demonstrate sweeping with use of bilateral UEs with modified independence.    Baseline unable   Time 12               Plan - 09/10/16 0947    Rehab Potential Good      Patient will benefit from skilled therapeutic intervention in order to improve the following deficits and impairments:  Decreased knowledge of use of DME, Decreased coordination, Impaired sensation, Decreased activity tolerance, Decreased range of motion, Decreased strength, Decreased balance, Difficulty walking, Impaired UE functional use  Visit  Diagnosis: Muscle weakness (generalized)    Problem List Patient Active Problem List   Diagnosis Date Noted  . Neurocognitive disorder   . Left foot drop   . Benign essential HTN   . Hemiparesis affecting nondominant side as late effect of cerebrovascular accident (CVA) ([place]) 07/18/2016  . Right basal ganglia embolic stroke ([place]) 123XX123  . CVA (cerebral infarction) 07/15/2016  . TIA (transient ischemic attack) 07/14/2016  . HTN, goal below 140/80 07/14/2016  . Left facial numbness 07/14/2016  . Weakness of left side of body 07/14/2016    Harrel Carina, MS, OTR/L 09/10/2016, 10:03 AM  [place] MAIN [facility] SERVICES [address] [place], [place], [address] Phone: [phone]   Fax:  [phone]  Name: Jason Oconnor MRN: [id number] Date of Birth: February 27, 1956

## 2016-09-10 NOTE — Patient Instructions (Signed)
OT TREATMENT    Neuro muscular re-education:  Pt. Worked on motor control and coordination skills using a wooden dowel alternating with right, and left. Pt. Worked on gross grasp/release strategies throwing 4" balls at a specific target.  Therapeutic Exercise:  Pt. Worked on supine LUE exercises at shoulder height with elbow extended. Pt. Worked on stabilization exercises, the alphabet, protraction exercises. Pt. Worked on bilateral shoulder flexion exercises with a green ball, and chest press exercises with a 1.5# dowel.  Manual Therapy:  Pt. Tolerated retrograde massage for edema control with LUE in elevation, joint and soft tissue mobes at carpals and metacarpals, carpal rolls, and metacarpal spread stretches.

## 2016-09-12 ENCOUNTER — Ambulatory Visit: Payer: 59

## 2016-09-12 ENCOUNTER — Encounter: Payer: Self-pay | Admitting: Occupational Therapy

## 2016-09-12 ENCOUNTER — Encounter: Payer: 59 | Attending: Physical Medicine & Rehabilitation

## 2016-09-12 ENCOUNTER — Encounter: Payer: Self-pay | Admitting: Physical Medicine & Rehabilitation

## 2016-09-12 ENCOUNTER — Ambulatory Visit (HOSPITAL_BASED_OUTPATIENT_CLINIC_OR_DEPARTMENT_OTHER): Payer: 59 | Admitting: Physical Medicine & Rehabilitation

## 2016-09-12 ENCOUNTER — Ambulatory Visit: Payer: 59 | Admitting: Occupational Therapy

## 2016-09-12 VITALS — BP 144/88

## 2016-09-12 VITALS — BP 135/85 | HR 75 | Resp 14

## 2016-09-12 DIAGNOSIS — M6281 Muscle weakness (generalized): Secondary | ICD-10-CM | POA: Diagnosis not present

## 2016-09-12 DIAGNOSIS — I69354 Hemiplegia and hemiparesis following cerebral infarction affecting left non-dominant side: Secondary | ICD-10-CM | POA: Diagnosis not present

## 2016-09-12 DIAGNOSIS — M21372 Foot drop, left foot: Secondary | ICD-10-CM | POA: Insufficient documentation

## 2016-09-12 DIAGNOSIS — R279 Unspecified lack of coordination: Secondary | ICD-10-CM | POA: Diagnosis not present

## 2016-09-12 DIAGNOSIS — R2681 Unsteadiness on feet: Secondary | ICD-10-CM

## 2016-09-12 DIAGNOSIS — R531 Weakness: Secondary | ICD-10-CM | POA: Insufficient documentation

## 2016-09-12 DIAGNOSIS — I10 Essential (primary) hypertension: Secondary | ICD-10-CM | POA: Diagnosis not present

## 2016-09-12 DIAGNOSIS — R278 Other lack of coordination: Secondary | ICD-10-CM

## 2016-09-12 DIAGNOSIS — I69359 Hemiplegia and hemiparesis following cerebral infarction affecting unspecified side: Secondary | ICD-10-CM | POA: Diagnosis not present

## 2016-09-12 NOTE — Therapy (Addendum)
[place] MAIN [facility] SERVICES [address] [place], [place], [address] Phone: [phone]   Fax:  [phone]  Occupational Therapy Treatment/Progress Note  Patient Details  Name: Jason Oconnor MRN: [id number] Date of Birth: [birth year] No Data Recorded  Encounter Date: 09/12/2016      OT End of Session - 09/12/16 0958    Visit Number 10   Number of Visits 24   Date for OT Re-Evaluation 11/01/16   OT Start Time 0845   OT Stop Time 0930   OT Time Calculation (min) 45 min   Activity Tolerance Patient tolerated treatment well   Behavior During Therapy [facility] for tasks assessed/performed      Past Medical History:  Diagnosis Date  . Bone marrow disease   . Hypertension     History reviewed. No pertinent surgical history.  There were no vitals filed for this visit.      Subjective Assessment - 09/12/16 0954    Subjective  Pt. reports the medium compression glove is working well.    Pertinent History Patient reports on July 13, 2016 he was helping a friend doing home repairs and felt tired, went home and started to notice problems moving around.  The next morning he feel trying to put his slippers on, wife transported him to ER and [facility] for 3 days and then went to [facility] for REhab and was discharged this past Saturday to home.     Patient Stated Goals "I want to get back to close as normal as possible, play golf again, work in the garage."     Currently in Pain? Yes   Pain Score 3    Pain Location Shoulder  Initially at shoulder flexion end range   Pain Orientation Left   Pain Descriptors / Indicators Aching   Pain Relieving Factors Pt. responded well to shoulder mobes, with reduced pain, and increased ROM.      OT TREATMENT    Neuro muscular re-education:  Pt. Worked on reaching up high into flexion with the right UE, while weightbearing through the Left hand to gently stretch the wrist extensors, and encourage gentle  proprioceptive input.  Therapeutic Exercise:  Pt. Worked on ther. ex in supine.  Shoulder flexion exercises were performed, shoulder stabilization exercises with shoulder flexed to 90 degrees, shoulder protraction,  pt. worked on ROM using a dowel. Pt. Worked on throwing 4" balls to a target with emphasis placed on gross releasing.   Manual Therapy:  Pt. Tolerate retrograde massage for edema control with pt. In supine, and LUE elevated. Pt. Tolerated shoulder A/P mobilizations/glides to decrease pain, and increase flexion. Pt. tolerate well, and presented with decreased pain, and increased ROM following.                          OT Education - 09/12/16 [id number]    Education provided Yes   Education Details Positioning, LUE edema control.   Person(s) Educated Patient   Methods Explanation;Demonstration   Comprehension Verbalized understanding;Returned demonstration             OT Long Term Goals - 09/12/16 1536      OT LONG TERM GOAL #1   Title Patient to improve left UE coordination sufficient to tie shoes with modified independence.    Baseline unable   Time 12   Period Weeks   Status On-going     OT LONG TERM GOAL #2   Title Patient to  improve LUE coordination to complete buttons, snaps and zippers on clothing with modified independence.   Time 12   Period Weeks   Status On-going     OT LONG TERM GOAL #3   Title Patient to complete bathing with modified independence including using left arm to wash right side of body.    Time 12   Period Weeks   Status On-going     OT LONG TERM GOAL #4   Title Patient to improve left hand use to type on computer using both hands on home row keys, 2 lines with no errors in 5 minutes.    Baseline unable   Time 12   Period Weeks   Status On-going     OT LONG TERM GOAL #5   Title Patient will improve grip on left hand by 10# to stabilize jars and containers for opening.    Baseline unable   Time 12   Period  Weeks   Status On-going     OT LONG TERM GOAL #6   Title Patient will be able to demonstrate sweeping with use of bilateral UEs with modified independence.    Baseline unable   Time 12   Period Weeks   Status On-going               Plan - 09/12/16 1506    Clinical Impression Statement Pt. medium size compression glove is fitting well, and helping with edema control. Pt. left hand had edema today, however was less than previous treatment day.  Pt. initially presented with left shoulder pain with ROM at the end range, however responded well to shoulder mobes, and was able to achieve more ROM without discomfort. Pt. has limited tolerance for weightbearing through his LUE and hand, secondary to wrist extension limitations. Pt. could benefit from skilled OT services to work on improving ADL/IADL functioning.   Rehab Potential Good   OT Frequency 2x / week   OT Duration 12 weeks   OT Treatment/Interventions Self-care/ADL training;Therapeutic exercise;Neuromuscular education;Visual/perceptual remediation/compensation;Therapist, nutritional;Therapeutic exercises;Patient/family education;DME and/or AE instruction;Manual Therapy;Therapeutic activities;Balance training   Consulted and Agree with Plan of Care Patient      Patient will benefit from skilled therapeutic intervention in order to improve the following deficits and impairments:  Decreased knowledge of use of DME, Decreased coordination, Impaired sensation, Decreased activity tolerance, Decreased range of motion, Decreased strength, Decreased balance, Difficulty walking, Impaired UE functional use  Visit Diagnosis: Muscle weakness (generalized)    Problem List Patient Active Problem List   Diagnosis Date Noted  . Neurocognitive disorder   . Left foot drop   . Benign essential HTN   . Hemiparesis affecting nondominant side as late effect of cerebrovascular accident (CVA) ([place]) 07/18/2016  . Right basal ganglia embolic  stroke ([place]) 123XX123  . CVA (cerebral infarction) 07/15/2016  . TIA (transient ischemic attack) 07/14/2016  . HTN, goal below 140/80 07/14/2016  . Left facial numbness 07/14/2016  . Weakness of left side of body 07/14/2016    Harrel Carina, MS, OTR/L 09/12/2016, 3:37 PM  [place] MAIN [facility] SERVICES [address] [place], [place], [address] Phone: [phone]   Fax:  [phone]  Name: Jason Oconnor MRN: [id number] Date of Birth: [birth year]

## 2016-09-12 NOTE — Therapy (Signed)
[place] MAIN [facility] SERVICES [address] [place], [place], [address] Phone: [phone]   Fax:  [phone]  Physical Therapy Treatment  Patient Details  Name: Jason Oconnor MRN: [id number] Date of Birth: 17-Apr-1956 Referring Provider: Dr. Naaman Plummer  Encounter Date: 09/12/2016      PT End of Session - 09/12/16 0858    Visit Number 10   Number of Visits 17   Date for PT Re-Evaluation 10/02/16   Authorization Type no g codes   PT Start Time 0800   PT Stop Time 0845   PT Time Calculation (min) 45 min   Equipment Utilized During Treatment Gait belt   Activity Tolerance Patient tolerated treatment well   Behavior During Therapy [facility] for tasks assessed/performed      Past Medical History:  Diagnosis Date  . Bone marrow disease   . Hypertension     History reviewed. No pertinent surgical history.  Vitals:   09/12/16 0810  BP: (!) 144/88        Subjective Assessment - 09/12/16 0810    Subjective Patient reports he's been performing HEP at home with his wife to perform ankle mobility.    Pertinent History Patient is a 60 year old right-handed male with history of hypertension. Per chart review patient is married. 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 Presented to [facility]14/2016 with left-sided weakness and facial droop. Blood pressure elevated 170/106. Head CT scan negative. 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 CTA of the head showed no emergent large vessel occlusion or severe stenosis. Carotid Dopplers with no ICA stenosis. Patient did not receive TPA. Echocardiogram with ejection fraction of 60%. Normal systolic function. Placed on aspirin and Plavix for CVA prophylaxis. Subcutaneous Lovenox for DVT prophylaxis.  Patient transferred to [facility] on  07/16/2016. Pt discharged form [facility] on 08/03/16. Pt reports that he made progress at [facility]. No reported problems since returning home. Currently using a wide base quad cane in RUE and L AFO. Pt reports that he has made the last progress with respect to his LUE strength.    Patient Stated Goals "Get back as close as I can to normal." Pt would to be able to play golf. Pt would like to return to some kind of work.    Currently in Pain? No/denies        TREATMENT: Therapeutic exercise: Seated ball roll outs with ball under foot for hamstring - x 2 min Sit to stands - 2 x 15 with right foot in front to encourage recruitment of L LE Calf raises on leg press machine with LLE - 30# x 20, 45# 2 x 20 without AD Pre Gait-widened tandem stance with anterior/posterior weight shifts for balance and motor control 2 x 15  Seated ball roll out tfor hamstring motor control and improving muscular endurance - x 41min Stool scoots with emphasis on L LE hamstring activation - 78ft x 2    Patient needs occasional verbal cueing to improve posture and cueing to correctly perform exercises slowly, holding at end of range to increase motor firing of desired muscle to encourage fatigue. Modalities NMES pulse width 380uS, Intensity 44, Rate: 50Hz , waveform: asymmetrical 10 minutes total time. 10 sec on/ 30 sec off against a 10 # weight with patient in sitting to improve motor recruitment.  PT Education - 09/12/16 0856    Education provided Yes   Education Details Educated on gait mechanics and proper muscle activation   Methods Explanation;Demonstration   Comprehension Verbalized understanding;Returned demonstration             PT Long Term Goals - 08/13/16 2020      PT LONG TERM GOAL #1   Title Pt will be independent with HEP in order to improve strength and balance in order to decrease fall risk and improve function at home and work.   Time 8   Period Weeks   Status New     PT LONG TERM GOAL #2    Title Pt will improve BERG by at least 3 points in order to demonstrate clinically significant improvement in balance.   Baseline 08/07/16: 49/56   Time 8   Period Weeks   Status New     PT LONG TERM GOAL #3   Title Pt will decrease TUG to below 14 seconds in order to demonstrate decreased fall risk    Baseline 08/07/16: 21.9 seconds   Time 8   Period Weeks   Status New     PT LONG TERM GOAL #4   Title Pt will decrease 5TSTS by at least 3 seconds in order to demonstrate clinically significant improvement in LE strength   Baseline 08/07/16: 18.5 seconds   Time 8   Period Weeks   Status New     PT LONG TERM GOAL #5   Title Pt will increase 10MWT by at least 0.13 m/s in order to demonstrate clinically significant improvement in community ambulation   Baseline 08/07/16: 0.53 m/s   Time 8   Period Weeks   Status New     Additional Long Term Goals   Additional Long Term Goals Yes     PT LONG TERM GOAL #6   Title Pt will be able to swing a golf club safely in order to complete 9 holes of golf   Time 8   Period Weeks   Status New     PT LONG TERM GOAL #7   Title Pt will improve 6MWT by at least 143ft  to demonstrate improved aerobic capacity/endurance   Baseline 582 ft   Time 7   Period Weeks   Status New               Plan - 09/12/16 0859    Clinical Impression Statement Focused  on improving tibialis anterior activation with exercise today and patient demonstrates improved activation/muscle recruitment after performing NMES modalities. Patient conitnues to demonstrate decreased motor control and muscular endurance with exercise and will benefit from further skilled therapy to return to prior level of function.    Rehab Potential Good   Clinical Impairments Affecting Rehab Potential Positive: motivation, prior functional level; Negative: no active dorsiflexion, increased LUE tone   PT Frequency 2x / week   PT Duration 8 weeks   PT Treatment/Interventions ADLs/Self Care  Home Management;Biofeedback;Canalith Repostioning;Cryotherapy;Electrical Stimulation;Iontophoresis 4mg /ml Dexamethasone;Aquatic Therapy;Moist Heat;Traction;Ultrasound;Gait training;Stair training;Functional mobility training;Therapeutic activities;DME Instruction;Therapeutic exercise;Balance training;Neuromuscular re-education;Cognitive remediation;Patient/family education;Orthotic Fit/Training;Manual techniques;Passive range of motion;Energy conservation;Taping;Vestibular   PT Next Visit Plan Clamshell; DGI, LEFS, NME for L anterior tibialis, Walk-aide, progress balance, strengthening, and HEP; Consider sidelying hip abduction exercises   PT Home Exercise Plan Sit to stand, seated tband resisted clams, seated adduction squeeze   Consulted and Agree with Plan of Care Patient      Patient will benefit from skilled therapeutic intervention in order  to improve the following deficits and impairments:  Abnormal gait, Decreased balance, Decreased coordination, Decreased mobility, Decreased range of motion, Decreased strength, Difficulty walking, Impaired tone, Impaired UE functional use  Visit Diagnosis: Muscle weakness (generalized)  Other lack of coordination  Unsteadiness on feet     Problem List Patient Active Problem List   Diagnosis Date Noted  . Neurocognitive disorder   . Left foot drop   . Benign essential HTN   . Hemiparesis affecting nondominant side as late effect of cerebrovascular accident (CVA) ([place]) 07/18/2016  . Right basal ganglia embolic stroke ([place]) 123XX123  . CVA (cerebral infarction) 07/15/2016  . TIA (transient ischemic attack) 07/14/2016  . HTN, goal below 140/80 07/14/2016  . Left facial numbness 07/14/2016  . Weakness of left side of body 07/14/2016    Blythe Stanford, PT DPT 09/12/2016, 9:13 AM  [place] MAIN [facility] SERVICES [address] [place], [place], [address] Phone: [phone]   Fax:  [phone]  Name:  Millard Donaghy MRN: [id number] Date of Birth: 04-Apr-1956

## 2016-09-12 NOTE — Patient Instructions (Addendum)
Trial of Fluidotherapy  For Left arm  Graduated return to driving instructions were provided. It is recommended that the patient first drives with another licensed driver in an empty parking lot. If the patient does well with this, and they can drive on a quiet street with the licensed driver. If the patient does well with this they can drive on a busy street with a licensed driver. If the patient does well with this, the next time out they can go by himself. For the first month after resuming driving, I recommend no nighttime or Interstate driving.

## 2016-09-12 NOTE — Progress Notes (Addendum)
Subjective:    Patient ID: Jason Oconnor, male    DOB: 22-Jun-1956, 60 y.o.   MRN: [id number]  HPI  Standing with shower Using AFO for longer distance but no AFO around the house. Left dorsal hand swelling trying retrograde massage. OT is working with him as well. Has trialed neuromuscular electrical stimulation for left ankle dorsiflexor weakness. With this stimulation unit on. He is able to lift 10 pounds with his ankle dorsiflexors. Patient has not driven. 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 General activity 0 Relation with others 0 Enjoyment of life 0 What TIME of day is your pain at its worst? night Sleep (in general) Fair  Pain is worse with: inactivity Pain improves with: therapy/exercise Relief from Meds: 0  Mobility use a cane how many minutes can you walk? 20 ability to climb steps?  yes do you drive?  no transfers alone  Function disabled: date disabled 07/14/2016 Do you have any goals in this area?  yes  Neuro/Psych weakness numbness spasms  Prior Studies Any changes since last visit?  no  Physicians involved in your care Any changes since last visit?  no   No family history on file. Social History   Social History  . Marital status: Married    Spouse name: N/A  . Number of children: N/A  . Years of education: N/A   Social History Main Topics  . Smoking status: Former Research scientist (life sciences)  . Smokeless tobacco: Never Used  . Alcohol use Yes     Comment: rare  . Drug use: No  . Sexual activity: Not Asked   Other Topics Concern  . None   Social History Narrative  . None   No past surgical history on file. Past Medical History:  Diagnosis Date  . Bone marrow disease   . Hypertension    BP 135/85   Pulse 75    Resp 14   SpO2 94%   Opioid Risk Score:   Fall Risk Score:  `1  Depression screen PHQ 2/9  Depression screen PHQ 2/9 08/13/2016  Decreased Interest 0  Down, Depressed, Hopeless 0  PHQ - 2 Score 0  Altered sleeping 0  Tired, decreased energy 1  Change in appetite 0  Feeling bad or failure about yourself  0  Trouble concentrating 0  Moving slowly or fidgety/restless 1  Suicidal thoughts 0  PHQ-9 Score 2  Difficult doing work/chores Not difficult at all      Review of Systems  Musculoskeletal:       Limb swelling   Hematological: Bruises/bleeds easily.  All other systems reviewed and are negative.      Objective:   Physical Exam  Motor strength is 5/5 in the right deltoid, biceps, triceps, grip, hip flexor, knee extensor, ankle dorsiflexor and plantar flexor. Left deltoid, bicep, tricep, and grip are 3 minus, finger extensors 2 minus, left hip flexor, 4 minus, knee extensor, 4 minus. Ankle dorsiflexor, trace ankle plantar flexor form  Sensation intact to light touch in the left upper and left lower limb. Visual fields are intact.  Extraocular muscles are intact.  Ambulates with decreased heel strike. He does use the left AFO. No evidence of toe drag. He does have some hyperextension at  the knee.      Assessment & Plan:  1. Right basal ganglia infarct causing left hemiparesis. Pure motor stroke, residual left upper extremity and left lower extremity weakness distal greater than proximal. He does have left dorsum of the hand swelling which is related to his stroke. He does not have any signs of reflex sympathetic dystrophy.  He has no cognitive deficits or visual spatial deficits, so I think he can resume driving on a limited basis. This was discussed with the patient and his wife.  Graduated return to driving instructions were provided. It is recommended that the patient first drives with another licensed driver in an empty parking lot. If the patient does well with  this, and they can drive on a quiet street with the licensed driver. If the patient does well with this they can drive on a busy street with a licensed driver. If the patient does well with this, the next time out they can go by himself. For the first month after resuming driving, I recommend no nighttime or Interstate driving.   Do not recommend any work at the current time. Reassess in 4 weeks

## 2016-09-12 NOTE — Patient Instructions (Addendum)
OT TREATMENT    Neuro muscular re-education:  Pt. Worked on reaching up high into flexion with the right UE, while weightbearing through the Left hand to gently stretch the wrist extensors, and encourage gentle proprioceptive input.  Therapeutic Exercise:  Pt. Worked on ther. ex in supine.  Shoulder flexion exercises were performed, shoulder stabilization exercises with shoulder flexed to 90 degrees, shoulder protraction,  pt. worked on ROM using a dowel. Pt. Worked on throwing 4" balls to a target with emphasis placed on gross releasing.   Manual Therapy:  Pt. Tolerate retrograde massage for edema control with pt. In supine, and LUE elevated. Pt. Tolerated shoulder A/P mobilizations/glides to decrease pain, and increase flexion. Pt. tolerate well, and presented with decreased pain, and increased ROM following.

## 2016-09-17 ENCOUNTER — Ambulatory Visit: Payer: 59

## 2016-09-17 ENCOUNTER — Encounter: Payer: Self-pay | Admitting: Occupational Therapy

## 2016-09-17 ENCOUNTER — Ambulatory Visit: Payer: 59 | Admitting: Occupational Therapy

## 2016-09-17 DIAGNOSIS — R2681 Unsteadiness on feet: Secondary | ICD-10-CM

## 2016-09-17 DIAGNOSIS — M6281 Muscle weakness (generalized): Secondary | ICD-10-CM

## 2016-09-17 DIAGNOSIS — R278 Other lack of coordination: Secondary | ICD-10-CM

## 2016-09-17 NOTE — Therapy (Signed)
[place] MAIN [facility] SERVICES [address] [place], [place], [address] Phone: [phone]   Fax:  [phone]  Occupational Therapy Treatment  Patient Details  Name: Jason Oconnor MRN: [id number] Date of Birth: 1956-02-03 No Data Recorded  Encounter Date: 09/17/2016      OT End of Session - 09/17/16 1421    Visit Number 11   Number of Visits 24   Date for OT Re-Evaluation 11/01/16   OT Start Time 0845   OT Stop Time 0930   OT Time Calculation (min) 45 min   Activity Tolerance Patient tolerated treatment well   Behavior During Therapy [facility] for tasks assessed/performed      Past Medical History:  Diagnosis Date  . Bone marrow disease   . Hypertension     History reviewed. No pertinent surgical history.  There were no vitals filed for this visit.      Subjective Assessment - 09/17/16 1404    Subjective  Pt. reports having had an interview for a different type of work within his company. 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   Pertinent History Patient reports on July 13, 2016 he was helping a friend doing home repairs and felt tired, went home and started to notice problems moving around.  The next morning he feel trying to put his slippers on, wife transported him to ER and [facility] for 3 days and then went to [facility] for REhab and was discharged this past Saturday to home.     Patient Stated Goals "I want to get back to close as normal as possible, play golf again, work in the garage."     Currently in Pain? Yes   Pain Score 3    Pain Location Shoulder   Pain Orientation Left   Pain Descriptors / Indicators Aching   Pain Type Acute pain      OT TREATMENT    Neuro muscular re-education:  Pt. worked on weightbearing through his LUE and hand while reaching with the RUE for the [employer], on the [employer].    Therapeutic Exercise:  Pt. Worked on LUE ther Ex. In supine. Shoulder proximal  stabilization ex., scapular protraction, formulating the alphabet with the left elbow extended, shoulder flexion, extension, abduction. Pt. Worked on Bilateral shoulder flexion, and chest presses with 1.5# weight in supine. Pt.worked on overhead throwing, with active releasing 4" Saebo circular spheres. Pt. Worked on throwing to two different targets.  Manual Therapy:  Pt. Tolerated AP shoulder mobes/glides for flexion secondary to pain within the ROM arc. Pt. was able to achieve increased ROM, with decreased pain following manual techniques. Pt. tolerated retrograde massage for edema control with LUE in elevation.                             OT Education - 09/17/16 1407    Education provided Yes   Education Details Pt. left UE functioing, ROM, edema control.   Person(s) Educated Patient   Methods Explanation;Demonstration   Comprehension Verbalized understanding;Returned demonstration             OT Long Term Goals - 09/12/16 1536      OT LONG TERM GOAL #1   Title Patient to improve left UE coordination sufficient to tie shoes with modified independence.    Baseline unable   Time 12   Period Weeks   Status On-going  OT LONG TERM GOAL #2   Title Patient to improve LUE coordination to complete buttons, snaps and zippers on clothing with modified independence.   Time 12   Period Weeks   Status On-going     OT LONG TERM GOAL #3   Title Patient to complete bathing with modified independence including using left arm to wash right side of body.    Time 12   Period Weeks   Status On-going     OT LONG TERM GOAL #4   Title Patient to improve left hand use to type on computer using both hands on home row keys, 2 lines with no errors in 5 minutes.    Baseline unable   Time 12   Period Weeks   Status On-going     OT LONG TERM GOAL #5   Title Patient will improve grip on left hand by 10# to stabilize jars and containers for opening.    Baseline unable    Time 12   Period Weeks   Status On-going     OT LONG TERM GOAL #6   Title Patient will be able to demonstrate sweeping with use of bilateral UEs with modified independence.    Baseline unable   Time 12   Period Weeks   Status On-going               Plan - 09/17/16 1422    Clinical Impression Statement Pt. continues to present with limited Left UE ROM, pain in the left UE at the end ROM, weakness, and edema in the Left hand through the volar and dorsal surfaces, thenar eminence, and MPs. Pt. is improving with proximal stability of the LUE in supine, and  is responding well to the shoulder mobes/glides with increased ROM, and decreased pain with shoulder flexion. Pt. continues to benefit from skilled OT services to improve edema, and work on improving LUE functioning.   Rehab Potential Good   OT Frequency 2x / week   OT Duration 12 weeks   OT Treatment/Interventions Self-care/ADL training;Therapeutic exercise;Neuromuscular education;Visual/perceptual remediation/compensation;Therapist, nutritional;Therapeutic exercises;Patient/family education;DME and/or AE instruction;Manual Therapy;Therapeutic activities;Balance training   Consulted and Agree with Plan of Care Patient      Patient will benefit from skilled therapeutic intervention in order to improve the following deficits and impairments:  Decreased knowledge of use of DME, Decreased coordination, Impaired sensation, Decreased activity tolerance, Decreased range of motion, Decreased strength, Decreased balance, Difficulty walking, Impaired UE functional use  Visit Diagnosis: Muscle weakness (generalized)    Problem List Patient Active Problem List   Diagnosis Date Noted  . Neurocognitive disorder   . Left foot drop   . Benign essential HTN   . Hemiparesis affecting nondominant side as late effect of cerebrovascular accident (CVA) ([place]) 07/18/2016  . Right basal ganglia embolic stroke ([place]) [id number]  . CVA  (cerebral infarction) 07/15/2016  . TIA (transient ischemic attack) 07/14/2016  . HTN, goal below 140/80 07/14/2016  . Left facial numbness 07/14/2016  . Weakness of left side of body 07/14/2016    Harrel Carina, MS, OTR/L 09/17/2016, 3:52 PM  [place] MAIN [facility] SERVICES [address] [place], [place], [address] Phone: [phone]   Fax:  [phone]  Name: Varish Grisson MRN: [id number] Date of Birth: 1956/09/21

## 2016-09-17 NOTE — Patient Instructions (Signed)
OT TREATMENT    Neuro muscular re-education:  Pt. worked on weightbearing through his LUE and hand while reaching with the RUE for the Saebo rings, on the [employer].    Therapeutic Exercise:  Pt. Worked on LUE ther Ex. In supine. Shoulder proximal stabilization ex., scapular protraction, formulating the alphabet with the left elbow extended, shoulder flexion, extension, abduction. Pt. Worked on Bilateral shoulder flexion, and chest presses with 1.5# weight in supine. Pt.worked on overhead throwing, with active releasing 4" Saebo circular spheres. Pt. Worked on throwing to two different targets.  Manual Therapy:  Pt. Tolerated AP shoulder mobes/glides for flexion secondary to pain within the ROM arc. Pt. was able to achieve increased ROM, with decreased pain following manual techniques. Pt. tolerated retrograde massage for edema control with LUE in elevation.

## 2016-09-17 NOTE — Therapy (Signed)
[place] MAIN [facility] SERVICES [address] [place], [place], [address] Phone: [phone]   Fax:  [phone]  Physical Therapy Treatment  Patient Details  Name: Jason Oconnor MRN: [id number] Date of Birth: [birth year] Referring Provider: Dr. Naaman Plummer  Encounter Date: 09/17/2016      PT End of Session - 09/17/16 0848    Visit Number 11   Number of Visits 17   Date for PT Re-Evaluation 10/02/16   Authorization Type no g codes   PT Start Time 0759   PT Stop Time 0845   PT Time Calculation (min) 46 min   Equipment Utilized During Treatment Gait belt   Activity Tolerance Patient tolerated treatment well   Behavior During Therapy [facility] for tasks assessed/performed      Past Medical History:  Diagnosis Date  . Bone marrow disease   . Hypertension     History reviewed. No pertinent surgical history.  There were no vitals filed for this visit.      Subjective Assessment - 09/17/16 0809    Subjective Patient reports he's been performing HEP with minimal to no changes noted. Reports he's been driving with little to no difficulty.    Pertinent History Patient is a 60 year old right-handed male with history of hypertension. Per chart review patient is married. 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 Presented to [facility] 07/14/2016 with left-sided weakness and facial droop. Blood pressure elevated 170/106. Head CT scan negative. 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 CTA of the head showed no emergent large vessel occlusion or severe stenosis. Carotid Dopplers with no ICA stenosis. Patient did not receive TPA. Echocardiogram with ejection fraction of 60%. Normal systolic function. Placed on aspirin and Plavix for CVA prophylaxis. Subcutaneous Lovenox for DVT  prophylaxis.  Patient transferred to CIR on 07/16/2016. Pt discharged form CIR on 08/03/16. Pt reports that he made progress at CIR. No reported problems since returning home. Currently using a wide base quad cane in RUE and L AFO. Pt reports that he has made the last progress with respect to his LUE strength.    Patient Stated Goals "Get back as close as I can to normal." Pt would to be able to play golf. Pt would like to return to some kind of work.       TREATMENT: Therapeutic exercise: Seated hamstring curls with GTB - 2 x 20  Cone taps from airex pad - 5 cone taps  5 x 2 down and back  Calf raises on leg press machine with LLE - 90# 2 x 20 without brace Stool scoots with emphasis on L LE hamstring activation - 29ft x 2  Pre Gait-widened tandem stance with anterior/posterior weight shifts for balance and motor control 2 x 15    Patient needs occasional verbal cueing to improve posture and cueing to correctly perform exercises slowly, holding at end of range to increase motor firing of desired muscle to encourage fatigue. Modalities NMES pulse width 380uS, Intensity 44, Rate: 50Hz , waveform: asymmetrical 10 minutes total time. 10 sec on/ 30 sec off against a 10 # weight with patient in sitting to improve motor recruitment.         PT Education - 09/17/16 0934    Education provided Yes   Education Details HEP: cone taps, Pre gait   Person(s) Educated Patient  Methods Explanation;Demonstration   Comprehension Verbalized understanding;Returned demonstration             PT Long Term Goals - 08/13/16 2020      PT LONG TERM GOAL #1   Title Pt will be independent with HEP in order to improve strength and balance in order to decrease fall risk and improve function at home and work.   Time 8   Period Weeks   Status New     PT LONG TERM GOAL #2   Title Pt will improve BERG by at least 3 points in order to demonstrate clinically significant improvement in balance.   Baseline  08/07/16: 49/56   Time 8   Period Weeks   Status New     PT LONG TERM GOAL #3   Title Pt will decrease TUG to below 14 seconds in order to demonstrate decreased fall risk    Baseline 08/07/16: 21.9 seconds   Time 8   Period Weeks   Status New     PT LONG TERM GOAL #4   Title Pt will decrease 5TSTS by at least 3 seconds in order to demonstrate clinically significant improvement in LE strength   Baseline 08/07/16: 18.5 seconds   Time 8   Period Weeks   Status New     PT LONG TERM GOAL #5   Title Pt will increase 10MWT by at least 0.13 m/s in order to demonstrate clinically significant improvement in community ambulation   Baseline 08/07/16: 0.53 m/s   Time 8   Period Weeks   Status New     Additional Long Term Goals   Additional Long Term Goals Yes     PT LONG TERM GOAL #6   Title Pt will be able to swing a golf club safely in order to complete 9 holes of golf   Time 8   Period Weeks   Status New     PT LONG TERM GOAL #7   Title Pt will improve 6MWT by at least 175ft  to demonstrate improved aerobic capacity/endurance   Baseline 582 ft   Time 7   Period Weeks   Status New               Plan - 09/17/16 0935    Clinical Impression Statement Continued to focus on improving tibialis anterior activation and hamstring strength. Patient demonstrates improvement of tibialis anterior activity and improved hamstring control and pt will benefit from further skilled therapy to return to prior level of function.    Rehab Potential Good   Clinical Impairments Affecting Rehab Potential Positive: motivation, prior functional level; Negative: no active dorsiflexion, increased LUE tone   PT Frequency 2x / week   PT Duration 8 weeks   PT Treatment/Interventions ADLs/Self Care Home Management;Biofeedback;Canalith Repostioning;Cryotherapy;Electrical Stimulation;Iontophoresis 4mg /ml Dexamethasone;Aquatic Therapy;Moist Heat;Traction;Ultrasound;Gait training;Stair training;Functional  mobility training;Therapeutic activities;DME Instruction;Therapeutic exercise;Balance training;Neuromuscular re-education;Cognitive remediation;Patient/family education;Orthotic Fit/Training;Manual techniques;Passive range of motion;Energy conservation;Taping;Vestibular   PT Next Visit Plan Clamshell; DGI, LEFS, NME for L anterior tibialis, Walk-aide, progress balance, strengthening, and HEP; Consider sidelying hip abduction exercises   PT Home Exercise Plan Sit to stand, seated tband resisted clams, seated adduction squeeze   Consulted and Agree with Plan of Care Patient      Patient will benefit from skilled therapeutic intervention in order to improve the following deficits and impairments:  Abnormal gait, Decreased balance, Decreased coordination, Decreased mobility, Decreased range of motion, Decreased strength, Difficulty walking, Impaired tone, Impaired UE functional use  Visit Diagnosis: Muscle  weakness (generalized)  Other lack of coordination  Unsteadiness on feet     Problem List Patient Active Problem List   Diagnosis Date Noted  . Neurocognitive disorder   . Left foot drop   . Benign essential HTN   . Hemiparesis affecting nondominant side as late effect of cerebrovascular accident (CVA) ([place]) 07/18/2016  . Right basal ganglia embolic stroke ([place]) 123XX123  . CVA (cerebral infarction) 07/15/2016  . TIA (transient ischemic attack) 07/14/2016  . HTN, goal below 140/80 07/14/2016  . Left facial numbness 07/14/2016  . Weakness of left side of body 07/14/2016    Blythe Stanford, PT DPT 09/17/2016, 9:46 AM  [place] MAIN [facility] SERVICES [address] [place], [place], [address] Phone: [phone]   Fax:  [phone]  Name: Travonta Hoard MRN: [id number] Date of Birth: 1956/08/26

## 2016-09-19 ENCOUNTER — Ambulatory Visit: Payer: 59 | Admitting: Occupational Therapy

## 2016-09-19 ENCOUNTER — Ambulatory Visit: Payer: 59 | Admitting: Physical Therapy

## 2016-09-19 VITALS — BP 147/95 | HR 84

## 2016-09-19 DIAGNOSIS — M6281 Muscle weakness (generalized): Secondary | ICD-10-CM | POA: Diagnosis not present

## 2016-09-19 DIAGNOSIS — R278 Other lack of coordination: Secondary | ICD-10-CM

## 2016-09-19 NOTE — Therapy (Signed)
[place] MAIN [facility] SERVICES [address] [place], [place], [address] Phone: [phone]   Fax:  [phone]  Physical Therapy Treatment  Patient Details  Name: Jason Oconnor MRN: [id number] Date of Birth: 26-Oct-1956 Referring Provider: Dr. Naaman Plummer  Encounter Date: 09/19/2016      PT End of Session - 09/19/16 0801    Visit Number 12   Number of Visits 17   Date for PT Re-Evaluation 10/02/16   Authorization Type no g codes   PT Start Time 0756   PT Stop Time 0842   PT Time Calculation (min) 46 min   Equipment Utilized During Treatment Gait belt   Activity Tolerance Patient tolerated treatment well   Behavior During Therapy [facility] for tasks assessed/performed      Past Medical History:  Diagnosis Date  . Bone marrow disease   . Hypertension     No past surgical history on file.  Vitals:   09/19/16 0801  BP: (!) 147/95  Pulse: 84  SpO2: 98%        Subjective Assessment - 09/19/16 0759    Subjective Pt reports he can tell he is making strength gains.  Has been completing his exercises at home throughout the day.  Says his L hand continues to swell and is painful.  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   Pertinent History Patient is a 60 year old right-handed male with history of hypertension. Per chart review patient is married. 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 Presented to [facility] 07/14/2016 with left-sided weakness and facial droop. Blood pressure elevated 170/106. Head CT scan negative. 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 CTA of the head showed no emergent large vessel occlusion or severe stenosis. Carotid Dopplers with no ICA stenosis. Patient did not receive TPA.  Echocardiogram with ejection fraction of 60%. Normal systolic function. Placed on aspirin and Plavix for CVA prophylaxis. Subcutaneous Lovenox for DVT prophylaxis.  Patient transferred to CIR on 07/16/2016. Pt discharged form CIR on 08/03/16. Pt reports that he made progress at CIR. No reported problems since returning home. Currently using a wide base quad cane in RUE and L AFO. Pt reports that he has made the last progress with respect to his LUE strength.    Patient Stated Goals "Get back as close as I can to normal." Pt would to be able to play golf. Pt would like to return to some kind of work.    Currently in Pain? No/denies       TREATMENT  Therapeutic Exercise: Seated L DF with YTB 3x15 with assist at times for heel positioning  Calf raises on leg press machine with LLE - 90# 3 x 10 without brace  L leg press 120# 1x10 and 2x15 with light manual assist guiding L knee medially to prevent excessive knee varus  Stool scoots with emphasis on L LE hamstring activation - 104ft x 2. Cues for heel strike in a position of neutral as opposed to hip ER  Forward lunge onto airex with emphasis of weight shifting and acceptance on LLE. Leading with L foot 2x10. Cues to not allow L knee to advance past toes.  Standing on airex and toe taps with LLE out to randomized balance stones with focus on LLE coordination, 2x30 seconds  Standing on airex and toe taps with RLE out to randomized balance stones with focus on LLE SLS balance and strength, 2x30 seconds  Sideways lunge leading with LLE onto BOSU ball, 3x10. Visible LLE fatigue toward end of each set.                    PT Education - 09/19/16 0801    Education provided Yes   Education Details Exercise technique   Person(s) Educated Patient   Methods Explanation;Demonstration;Verbal cues   Comprehension Verbalized understanding;Returned demonstration             PT Long Term Goals - 08/13/16 2020      PT LONG TERM GOAL #1    Title Pt will be independent with HEP in order to improve strength and balance in order to decrease fall risk and improve function at home and work.   Time 8   Period Weeks   Status New     PT LONG TERM GOAL #2   Title Pt will improve BERG by at least 3 points in order to demonstrate clinically significant improvement in balance.   Baseline 08/07/16: 49/56   Time 8   Period Weeks   Status New     PT LONG TERM GOAL #3   Title Pt will decrease TUG to below 14 seconds in order to demonstrate decreased fall risk    Baseline 08/07/16: 21.9 seconds   Time 8   Period Weeks   Status New     PT LONG TERM GOAL #4   Title Pt will decrease 5TSTS by at least 3 seconds in order to demonstrate clinically significant improvement in LE strength   Baseline 08/07/16: 18.5 seconds   Time 8   Period Weeks   Status New     PT LONG TERM GOAL #5   Title Pt will increase 10MWT by at least 0.13 m/s in order to demonstrate clinically significant improvement in community ambulation   Baseline 08/07/16: 0.53 m/s   Time 8   Period Weeks   Status New     Additional Long Term Goals   Additional Long Term Goals Yes     PT LONG TERM GOAL #6   Title Pt will be able to swing a golf club safely in order to complete 9 holes of golf   Time 8   Period Weeks   Status New     PT LONG TERM GOAL #7   Title Pt will improve 6MWT by at least 172ft  to demonstrate improved aerobic capacity/endurance   Baseline 582 ft   Time 7   Period Weeks   Status New               Plan - 09/19/16 0827    Clinical Impression Statement Pt is making good progress and remains very motivated.  He tolerated all interventions well today with cues for technique for proper muscle group recruitment.  His goal is to be able to walk without orthotic.  Pt will benefit from continued skilled PT services to adress coordination, strenth, and neuromuscular control.    Rehab Potential Good   Clinical Impairments Affecting Rehab Potential  Positive: motivation, prior functional level; Negative: no active dorsiflexion, increased LUE tone   PT Frequency 2x / week   PT Duration 8 weeks   PT Treatment/Interventions ADLs/Self Care Home Management;Biofeedback;Canalith Repostioning;Cryotherapy;Electrical Stimulation;Iontophoresis 4mg /ml Dexamethasone;Aquatic Therapy;Moist Heat;Traction;Ultrasound;Gait training;Stair training;Functional mobility training;Therapeutic activities;DME Instruction;Therapeutic exercise;Balance training;Neuromuscular re-education;Cognitive remediation;Patient/family education;Orthotic Fit/Training;Manual techniques;Passive range of motion;Energy  conservation;Taping;Vestibular   PT Next Visit Plan Clamshell; DGI, LEFS, NME for L anterior tibialis, Walk-aide, progress balance, strengthening, and HEP; Consider sidelying hip abduction exercises   PT Home Exercise Plan Sit to stand, seated tband resisted clams, seated adduction squeeze   Consulted and Agree with Plan of Care Patient      Patient will benefit from skilled therapeutic intervention in order to improve the following deficits and impairments:  Abnormal gait, Decreased balance, Decreased coordination, Decreased mobility, Decreased range of motion, Decreased strength, Difficulty walking, Impaired tone, Impaired UE functional use  Visit Diagnosis: Muscle weakness (generalized)  Other lack of coordination     Problem List Patient Active Problem List   Diagnosis Date Noted  . Neurocognitive disorder   . Left foot drop   . Benign essential HTN   . Hemiparesis affecting nondominant side as late effect of cerebrovascular accident (CVA) ([place]) 07/18/2016  . Right basal ganglia embolic stroke ([place]) 123XX123  . CVA (cerebral infarction) 07/15/2016  . TIA (transient ischemic attack) 07/14/2016  . HTN, goal below 140/80 07/14/2016  . Left facial numbness 07/14/2016  . Weakness of left side of body 07/14/2016    Collie Siad PT, DPT 09/19/2016, 8:56  AM  [place] MAIN [facility] SERVICES [address] [place], [place], [address] Phone: [phone]   Fax:  [phone]  Name: Jason Oconnor MRN: [id number] Date of Birth: [birth year]

## 2016-09-22 ENCOUNTER — Encounter: Payer: Self-pay | Admitting: Occupational Therapy

## 2016-09-22 NOTE — Therapy (Signed)
[place] MAIN [facility] SERVICES [address] [place], [place], [address] Phone: [phone]   Fax:  [phone]  Occupational Therapy Treatment  Patient Details  Name: Jason Oconnor MRN: [id number] Date of Birth: [birth year] No Data Recorded  Encounter Date: 09/19/2016      OT End of Session - 09/22/16 1339    Visit Number 12   Number of Visits 24   Date for OT Re-Evaluation 11/01/16   OT Start Time 0846   OT Stop Time 0930   OT Time Calculation (min) 44 min   Activity Tolerance Patient tolerated treatment well   Behavior During Therapy [facility] for tasks assessed/performed      Past Medical History:  Diagnosis Date  . Bone marrow disease   . Hypertension     History reviewed. No pertinent surgical history.  There were no vitals filed for this visit.      Subjective Assessment - 09/22/16 1332    Subjective  Pt. reports having had an interview for a different type of work within his company, feels it went OK.   Pertinent History Patient reports on July 13, 2016 he was helping a friend doing home repairs and felt tired, went home and started to notice problems moving around.  The next morning he feel trying to put his slippers on, wife transported him to ER and [facility] for 3 days and then went to [facility] for REhab and was discharged this past Saturday to home.     Patient Stated Goals "I want to get back to close as normal as possible, play golf again, work in the garage."     Currently in Pain? No/denies                      OT Treatments/Exercises (OP) - 09/22/16 1333      Fine Motor Coordination   Other Fine Motor Exercises Patient seen for fine motor coordination tasks with left hand for picking up and manipulation of items, hand flat on table with cues for finger extension for isolated finger movements, ABD/ADD of digits for multiple sets and reps.       Neurological Re-education Exercises   Other Exercises 1  Patient seen for UBE in sitting with resistance of 1.5 to 1.7 for 5 minutes total, forwards/backwards, alternating levels of resistance with therapist in constant attendance to ensure grip and adjust settings.  Finger ladder in standing for 2 sets with cues for technique and to decrease compensatory movements at the left shoulder.  Mat exercises for ABC shoulder stabilization, place and hold with external challenge, isometric strengthening, applied coban wrap to left hand and digits during all exercises to further address edema, removed at the end of session.  Saebo tower in standing for 3 levels with cues for technique and reaching to move items from one level to another.      Manual Therapy   Manual Therapy Joint mobilization   Manual therapy comments LUE shoulder joint mobilization glenohumeral for inferior and posterior mobs, Grade III prior to ROM to decrease pain and increase ROM. S                OT Education - 09/22/16 1339    Education provided Yes   Education Details normal movement patterns, HEP, edema control    Person(s) Educated Patient   Methods Explanation;Demonstration;Verbal cues   Comprehension Verbal cues required;Returned demonstration;Verbalized understanding  OT Long Term Goals - 09/12/16 1536      OT LONG TERM GOAL #1   Title Patient to improve left UE coordination sufficient to tie shoes with modified independence.    Baseline unable   Time 12   Period Weeks   Status On-going     OT LONG TERM GOAL #2   Title Patient to improve LUE coordination to complete buttons, snaps and zippers on clothing with modified independence.   Time 12   Period Weeks   Status On-going     OT LONG TERM GOAL #3   Title Patient to complete bathing with modified independence including using left arm to wash right side of body.    Time 12   Period Weeks   Status On-going     OT LONG TERM GOAL #4   Title Patient to improve left hand use to type on computer  using both hands on home row keys, 2 lines with no errors in 5 minutes.    Baseline unable   Time 12   Period Weeks   Status On-going     OT LONG TERM GOAL #5   Title Patient will improve grip on left hand by 10# to stabilize jars and containers for opening.    Baseline unable   Time 12   Period Weeks   Status On-going     OT LONG TERM GOAL #6   Title Patient will be able to demonstrate sweeping with use of bilateral UEs with modified independence.    Baseline unable   Time 12   Period Weeks   Status On-going               Plan - 09/22/16 1340    Clinical Impression Statement Patient with increased edema in left hand especially at the dorsum, feels edema glove helps at times slightly, continues to work towards positioning in elevation and performing retrograde massage.  Patient progressing to performing exercises in sitting and standing but continues to need exercises in supine to work towards improved strength, ROM and stabilization towards increased functional use.    Rehab Potential Good   OT Frequency 2x / week   OT Duration 12 weeks   OT Treatment/Interventions Self-care/ADL training;Therapeutic exercise;Neuromuscular education;Visual/perceptual remediation/compensation;Therapist, nutritional;Therapeutic exercises;Patient/family education;DME and/or AE instruction;Manual Therapy;Therapeutic activities;Balance training   Consulted and Agree with Plan of Care Patient      Patient will benefit from skilled therapeutic intervention in order to improve the following deficits and impairments:  Decreased knowledge of use of DME, Decreased coordination, Impaired sensation, Decreased activity tolerance, Decreased range of motion, Decreased strength, Decreased balance, Difficulty walking, Impaired UE functional use  Visit Diagnosis: Muscle weakness (generalized)  Other lack of coordination    Problem List Patient Active Problem List   Diagnosis Date Noted  .  Neurocognitive disorder   . Left foot drop   . Benign essential HTN   . Hemiparesis affecting nondominant side as late effect of cerebrovascular accident (CVA) ([place]) 07/18/2016  . Right basal ganglia embolic stroke ([place]) [id number]  . CVA (cerebral infarction) 07/15/2016  . TIA (transient ischemic attack) 07/14/2016  . HTN, goal below 140/80 07/14/2016  . Left facial numbness 07/14/2016  . Weakness of left side of body 07/14/2016   [name] Oneita Jolly, OTR/L, CLT  [name],[name] 09/22/2016, 1:44 PM  [place] MAIN [facility] SERVICES [address] [place], [place], [address] Phone: [phone]   Fax:  [phone]  Name: Jason Oconnor MRN: [id number] Date  of Birth: Jan 11, 1956

## 2016-09-25 ENCOUNTER — Ambulatory Visit: Payer: 59 | Attending: Physical Medicine & Rehabilitation | Admitting: Occupational Therapy

## 2016-09-25 DIAGNOSIS — R2681 Unsteadiness on feet: Secondary | ICD-10-CM | POA: Insufficient documentation

## 2016-09-25 DIAGNOSIS — M6281 Muscle weakness (generalized): Secondary | ICD-10-CM | POA: Insufficient documentation

## 2016-09-25 DIAGNOSIS — R278 Other lack of coordination: Secondary | ICD-10-CM | POA: Insufficient documentation

## 2016-09-26 ENCOUNTER — Encounter: Payer: Self-pay | Admitting: Occupational Therapy

## 2016-09-26 NOTE — Therapy (Signed)
[place] MAIN [facility] SERVICES [address] [place], [place], [address] Phone: [phone]   Fax:  [phone]  Occupational Therapy Treatment  Patient Details  Name: Jason Oconnor MRN: [id number] Date of Birth: Mar 24, 1956 No Data Recorded  Encounter Date: 09/25/2016      OT End of Session - 09/26/16 1616    Visit Number 13   Number of Visits 24   Date for OT Re-Evaluation 11/01/16   OT Start Time 0830   OT Stop Time 0927   OT Time Calculation (min) 57 min   Activity Tolerance Patient tolerated treatment well   Behavior During Therapy [facility] for tasks assessed/performed      Past Medical History:  Diagnosis Date  . Bone marrow disease   . Hypertension     History reviewed. No pertinent surgical history.  There were no vitals filed for this visit.      Subjective Assessment - 09/26/16 1614    Subjective  Patient reports he is doing as much as he can at home and wants to get better but is frustrated feeling like it is taking a long time.    Pertinent History Patient reports on July 13, 2016 he was helping a friend doing home repairs and felt tired, went home and started to notice problems moving around.  The next morning he feel trying to put his slippers on, wife transported him to ER and [facility] for 3 days and then went to [facility] for REhab and was discharged this past Saturday to home.     Patient Stated Goals "I want to get back to close as normal as possible, play golf again, work in the garage."     Currently in Pain? No/denies   Pain Score 0-No pain                      OT Treatments/Exercises (OP) - 09/26/16 1626      Fine Motor Coordination   Other Fine Motor Exercises Patient seen for object manipulation of 100 pegboard (judy board) to pick up and turn item from one end to the other with use of oppositional movements of the left digits, thumb to index and middle fingers.      Neurological Re-education  Exercises   Other Exercises 1 UBE with left hand wrapped with ace bandage to assist with sustaining grip as well as therapist guidance and assist for 5 minutes, forwards and backwards with left arm only with no resistance, cues for shoulder and arm movements and to correct substitution of movement patterns.  Reaching task multidirectional with 4 level SAEBO tower with looped balls for grasp/release combined with reach, cues for technique and form.     Other Exercises 2 Patient seen this date for contrast bath for edema control for LUE wrist and hand.  Cold bath 50 degrees, warm bath 100 degrees, alternating with warm bath 2-4 minutes, followed by cold for 1 minute for 4 cycles ending with a cold bath.  Patient encouraged to perform ROM in warm bath.  Issued written instructions for contrast bath at home, recommended patient get 2 medium buckets to use so that he can submerge his arm past the wrist.                  OT Education - 09/26/16 1615    Education provided Yes   Education Details Edema control measures, contrast bath, HEP   Person(s) Educated Patient   Methods Demonstration;Explanation;Verbal cues  Comprehension Verbal cues required;Returned demonstration;Verbalized understanding             OT Long Term Goals - 09/12/16 1536      OT LONG TERM GOAL #1   Title Patient to improve left UE coordination sufficient to tie shoes with modified independence.    Baseline unable   Time 12   Period Weeks   Status On-going     OT LONG TERM GOAL #2   Title Patient to improve LUE coordination to complete buttons, snaps and zippers on clothing with modified independence.   Time 12   Period Weeks   Status On-going     OT LONG TERM GOAL #3   Title Patient to complete bathing with modified independence including using left arm to wash right side of body.    Time 12   Period Weeks   Status On-going     OT LONG TERM GOAL #4   Title Patient to improve left hand use to type on  computer using both hands on home row keys, 2 lines with no errors in 5 minutes.    Baseline unable   Time 12   Period Weeks   Status On-going     OT LONG TERM GOAL #5   Title Patient will improve grip on left hand by 10# to stabilize jars and containers for opening.    Baseline unable   Time 12   Period Weeks   Status On-going     OT LONG TERM GOAL #6   Title Patient will be able to demonstrate sweeping with use of bilateral UEs with modified independence.    Baseline unable   Time 12   Period Weeks   Status On-going               Plan - 09/26/16 1620    Clinical Impression Statement Patient instructed on use of contrast bath for edema control measures with written instructions on use at home.  He will plan to perform over the next few days to see if this will make a difference in his left hand edema.  He is aware he will need to continue with other measures such as proper positioning, exercise, use of edema glove.  Focused on use of LUE for multidirectional reaching and manipulation of objects with oppositional movements of the left thumb, index and middle fingers.  Patient lacks full opposition to thumb and middle finger. Continue to work towards these skills and goals to improve independence in daily tasks at work, home and in the community.   Rehab Potential Good   OT Frequency 2x / week   OT Duration 12 weeks   OT Treatment/Interventions Self-care/ADL training;Therapeutic exercise;Neuromuscular education;Visual/perceptual remediation/compensation;Therapist, nutritional;Therapeutic exercises;Patient/family education;DME and/or AE instruction;Manual Therapy;Therapeutic activities;Balance training   Consulted and Agree with Plan of Care Patient      Patient will benefit from skilled therapeutic intervention in order to improve the following deficits and impairments:  Decreased knowledge of use of DME, Decreased coordination, Impaired sensation, Decreased activity  tolerance, Decreased range of motion, Decreased strength, Decreased balance, Difficulty walking, Impaired UE functional use  Visit Diagnosis: Muscle weakness (generalized)  Other lack of coordination    Problem List Patient Active Problem List   Diagnosis Date Noted  . Neurocognitive disorder   . Left foot drop   . Benign essential HTN   . Hemiparesis affecting nondominant side as late effect of cerebrovascular accident (CVA) ([place]) 07/18/2016  . Right basal ganglia embolic stroke ([place]) 123XX123  .  CVA (cerebral infarction) 07/15/2016  . TIA (transient ischemic attack) 07/14/2016  . HTN, goal below 140/80 07/14/2016  . Left facial numbness 07/14/2016  . Weakness of left side of body 07/14/2016   [name] Oneita Jolly, OTR/L, CLT  [name],[name] 09/26/2016, 4:32 PM  [place] MAIN [facility] SERVICES [address] [place], [place], [address] Phone: [phone]   Fax:  [phone]  Name: Jason Oconnor MRN: [id number] Date of Birth: 1956-05-22

## 2016-09-27 ENCOUNTER — Ambulatory Visit: Payer: 59

## 2016-09-27 ENCOUNTER — Ambulatory Visit: Payer: 59 | Admitting: Occupational Therapy

## 2016-09-27 DIAGNOSIS — R278 Other lack of coordination: Secondary | ICD-10-CM

## 2016-09-27 DIAGNOSIS — M6281 Muscle weakness (generalized): Secondary | ICD-10-CM

## 2016-09-27 DIAGNOSIS — R2681 Unsteadiness on feet: Secondary | ICD-10-CM

## 2016-09-27 NOTE — Therapy (Signed)
[place] MAIN [facility] SERVICES [address] [place], [place], [address] Phone: [phone]   Fax:  [phone]  Physical Therapy Treatment  Patient Details  Name: Jason Oconnor MRN: [id number] Date of Birth: 1956/02/01 Referring Provider: Dr. Naaman Plummer  Encounter Date: 09/27/2016      PT End of Session - 09/27/16 0905    Visit Number 13   Number of Visits 17   Date for PT Re-Evaluation 10/02/16   Authorization Type no g codes   PT Start Time 0802   PT Stop Time 0845   PT Time Calculation (min) 43 min   Equipment Utilized During Treatment Gait belt   Activity Tolerance Patient tolerated treatment well   Behavior During Therapy [facility] for tasks assessed/performed      Past Medical History:  Diagnosis Date  . Bone marrow disease   . Hypertension     History reviewed. No pertinent surgical history.  There were no vitals filed for this visit.      Subjective Assessment - 09/27/16 0829    Subjective Pt reports he had a difficult day yesterday where his leg " felt like concrete" but currently is not experiencing symptoms.    Pertinent History Patient is a 60 year old right-handed male with history of hypertension. Per chart review patient is married. 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 Presented to [facility] 07/14/2016 with left-sided weakness and facial droop. Blood pressure elevated 170/106. Head CT scan negative. 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 CTA of the head showed no emergent large vessel occlusion or severe stenosis. Carotid Dopplers with no ICA stenosis. Patient did not receive TPA. Echocardiogram with ejection fraction of 60%. Normal systolic function. Placed on aspirin and Plavix for CVA prophylaxis. Subcutaneous Lovenox for DVT prophylaxis.   Patient transferred to CIR on 07/16/2016. Pt discharged form CIR on 08/03/16. Pt reports that he made progress at CIR. No reported problems since returning home. Currently using a wide base quad cane in RUE and L AFO. Pt reports that he has made the last progress with respect to his LUE strength.    Patient Stated Goals "Get back as close as I can to normal." Pt would to be able to play golf. Pt would like to return to some kind of work.      TREATMENT:  Modalities NMES pulse width 380uS, Intensity 44, Rate: 50Hz , waveform: asymmetrical 8 minutes total time. 10 sec on/ 30 sec off against a 10 # weight with patient in sitting to improve motor recruitment.  Therapeutic Exercise: -Seated L DF with 10# weight 3x15 with assist at times for heel positioning  -Seated L PF with GTB 2 x 15 with cueing on foot positioning  -Stool scoots with emphasis on L LE hamstring activation - 31ft x 2. Cues for heel strike in a position of neutral as opposed to hip ER  -Standing on airex and toe taps with LLE out to cones with focus on LLE coordination, 2x30 seconds 4 cones each rep, 10 reps total  -Standing on airex and toe taps with RLE out to cones with focus on LLE -SLS balance and strength, 4 cones each rep, 10 reps total  -Forward lunge onto airex with emphasis of weight shifting and acceptance on LLE. Leading with L foot 2x10. Cues to not allow L knee to advance past toes.  -  Sideways lunge leading with LLE onto airex, 3x10. Visible LLE fatigue toward end of each set. Rocker board weight shifts left/right - 2 x 10 with cueing on speed and control Standing rocker board mini squats - x 20      PT Education - 09/27/16 0837    Education provided Yes   Education Details Educated on performing HEP    Person(s) Educated Patient   Methods Explanation;Demonstration   Comprehension Verbalized understanding;Returned demonstration             PT Long Term Goals - 08/13/16 2020      PT LONG TERM GOAL #1    Title Pt will be independent with HEP in order to improve strength and balance in order to decrease fall risk and improve function at home and work.   Time 8   Period Weeks   Status New     PT LONG TERM GOAL #2   Title Pt will improve BERG by at least 3 points in order to demonstrate clinically significant improvement in balance.   Baseline 08/07/16: 49/56   Time 8   Period Weeks   Status New     PT LONG TERM GOAL #3   Title Pt will decrease TUG to below 14 seconds in order to demonstrate decreased fall risk    Baseline 08/07/16: 21.9 seconds   Time 8   Period Weeks   Status New     PT LONG TERM GOAL #4   Title Pt will decrease 5TSTS by at least 3 seconds in order to demonstrate clinically significant improvement in LE strength   Baseline 08/07/16: 18.5 seconds   Time 8   Period Weeks   Status New     PT LONG TERM GOAL #5   Title Pt will increase 10MWT by at least 0.13 m/s in order to demonstrate clinically significant improvement in community ambulation   Baseline 08/07/16: 0.53 m/s   Time 8   Period Weeks   Status New     Additional Long Term Goals   Additional Long Term Goals Yes     PT LONG TERM GOAL #6   Title Pt will be able to swing a golf club safely in order to complete 9 holes of golf   Time 8   Period Weeks   Status New     PT LONG TERM GOAL #7   Title Pt will improve 6MWT by at least 155ft  to demonstrate improved aerobic capacity/endurance   Baseline 582 ft   Time 7   Period Weeks   Status New               Plan - 09/27/16 0905    Clinical Impression Statement Patient demonstrates improved gait with ambulation today with improved stride and step length compared to previous visits indicating functional carryover between visits. Although patient is improving, he continues to demonstrate decreased muscular endurance and strength and will benefit from further skilled therapy to return to prior level of function.   Rehab Potential Good   Clinical  Impairments Affecting Rehab Potential Positive: motivation, prior functional level; Negative: no active dorsiflexion, increased LUE tone   PT Frequency 2x / week   PT Duration 8 weeks   PT Treatment/Interventions ADLs/Self Care Home Management;Biofeedback;Canalith Repostioning;Cryotherapy;Electrical Stimulation;Iontophoresis 4mg /ml Dexamethasone;Aquatic Therapy;Moist Heat;Traction;Ultrasound;Gait training;Stair training;Functional mobility training;Therapeutic activities;DME Instruction;Therapeutic exercise;Balance training;Neuromuscular re-education;Cognitive remediation;Patient/family education;Orthotic Fit/Training;Manual techniques;Passive range of motion;Energy conservation;Taping;Vestibular   PT Next Visit Plan Clamshell; DGI, LEFS, NME for L anterior tibialis, Walk-aide, progress balance,  strengthening, and HEP; Consider sidelying hip abduction exercises   PT Home Exercise Plan Sit to stand, seated tband resisted clams, seated adduction squeeze   Consulted and Agree with Plan of Care Patient      Patient will benefit from skilled therapeutic intervention in order to improve the following deficits and impairments:  Abnormal gait, Decreased balance, Decreased coordination, Decreased mobility, Decreased range of motion, Decreased strength, Difficulty walking, Impaired tone, Impaired UE functional use  Visit Diagnosis: Muscle weakness (generalized)  Other lack of coordination  Unsteadiness on feet     Problem List Patient Active Problem List   Diagnosis Date Noted  . Neurocognitive disorder   . Left foot drop   . Benign essential HTN   . Hemiparesis affecting nondominant side as late effect of cerebrovascular accident (CVA) ([place]) 07/18/2016  . Right basal ganglia embolic stroke ([place]) [id number]  . CVA (cerebral infarction) 07/15/2016  . TIA (transient ischemic attack) 07/14/2016  . HTN, goal below 140/80 07/14/2016  . Left facial numbness 07/14/2016  . Weakness of left side of  body 07/14/2016    Blythe Stanford, PT DPT 09/27/2016, 9:09 AM  [place] MAIN [facility] SERVICES [address] [place], [place], [address] Phone: [phone]   Fax:  [phone]  Name: Jason Oconnor MRN: [id number] Date of Birth: 11-20-1956

## 2016-10-02 ENCOUNTER — Ambulatory Visit: Payer: 59

## 2016-10-02 ENCOUNTER — Ambulatory Visit: Payer: 59 | Admitting: Occupational Therapy

## 2016-10-02 DIAGNOSIS — M6281 Muscle weakness (generalized): Secondary | ICD-10-CM

## 2016-10-02 DIAGNOSIS — R2681 Unsteadiness on feet: Secondary | ICD-10-CM

## 2016-10-02 DIAGNOSIS — R278 Other lack of coordination: Secondary | ICD-10-CM

## 2016-10-02 NOTE — Therapy (Signed)
[place] MAIN [facility] SERVICES [address] [place], [place], [address] Phone: [phone]   Fax:  [phone]  Physical Therapy Treatment  Patient Details  Name: Jason Oconnor MRN: [id number] Date of Birth: December 06, 1955 Referring Provider: Dr. Naaman Plummer  Encounter Date: 10/02/2016      PT End of Session - 10/02/16 1159    Visit Number 14   Number of Visits 17   Date for PT Re-Evaluation 10/02/16   Authorization Type no g codes   PT Start Time 0801   PT Stop Time 0845   PT Time Calculation (min) 44 min   Equipment Utilized During Treatment Gait belt   Activity Tolerance Patient tolerated treatment well   Behavior During Therapy [facility] for tasks assessed/performed      Past Medical History:  Diagnosis Date  . Bone marrow disease   . Hypertension     History reviewed. No pertinent surgical history.  There were no vitals filed for this visit.      Subjective Assessment - 10/02/16 0809    Subjective Patient  reports he's been performing HEP at home. States he feels like he's improving but does not feel comfortable with ambulating long distances without AD of brace. States his calves are getting stronger but conitnues to have difficulty with push off portion of the gait cycle.    Pertinent History Patient is a 60 year old right-handed male with history of hypertension. Per chart review patient is married. 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 Presented to [facility] 07/14/2016 with left-sided weakness and facial droop. Blood pressure elevated 170/106. Head CT scan negative. 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 CTA of the head showed no emergent large vessel occlusion or severe stenosis. Carotid Dopplers with no ICA stenosis. Patient did not receive TPA.  Echocardiogram with ejection fraction of 60%. Normal systolic function. Placed on aspirin and Plavix for CVA prophylaxis. Subcutaneous Lovenox for DVT prophylaxis.  Patient transferred to [facility] on 07/16/2016. Pt discharged form [facility] on 08/03/16. Pt reports that he made progress at [facility]. No reported problems since returning home. Currently using a wide base quad cane in RUE and L AFO. Pt reports that he has made the last progress with respect to his LUE strength.    Patient Stated Goals "Get back as close as I can to normal." Pt would to be able to play golf. Pt would like to return to some kind of work.             [facility] PT Assessment - 10/02/16 0818      Standardized Balance Assessment   Standardized Balance Assessment Dynamic Gait Index   Five times sit to stand comments  14.3 sec   10 Meter Walk .38     Berg Balance Test   Sit to Stand Able to stand without using hands and stabilize independently   Standing Unsupported Able to stand safely 2 minutes   Sitting with Back Unsupported but Feet Supported on Floor or Stool Able to sit safely and securely 2 minutes   Stand to Sit Sits safely with minimal use of hands   Transfers Able to transfer safely, minor use of hands   Standing Unsupported with Eyes Closed Able to stand 10 seconds safely   Standing Ubsupported with Feet Together Able to place feet together independently and stand 1 minute  safely   From Standing, Reach Forward with Outstretched Arm Can reach confidently >25 cm (10")   From Standing Position, Pick up Object from Floor Able to pick up shoe safely and easily   From Standing Position, Turn to Look Behind Over each Shoulder Looks behind from both sides and weight shifts well   Turn 360 Degrees Able to turn 360 degrees safely in 4 seconds or less   Standing Unsupported, Alternately Place Feet on Step/Stool Able to stand independently and complete 8 steps >20 seconds   Standing Unsupported, One Foot in Front Able to place foot tandem  independently and hold 30 seconds   Standing on One Leg Able to lift leg independently and hold equal to or more than 3 seconds   Total Score 53     Dynamic Gait Index   Level Surface Normal   Change in Gait Speed Mild Impairment   Gait with Horizontal Head Turns Mild Impairment   Gait with Vertical Head Turns Mild Impairment   Gait and Pivot Turn Normal   Step Over Obstacle Mild Impairment   Step Around Obstacles Normal   Steps Mild Impairment   Total Score 19     Timed Up and Go Test   Normal TUG (seconds) 12.3       TREATMENT:  Modalities NMES pulse width 380uS, Intensity 44, Rate: 50Hz , waveform: asymmetrical 8 minutes total time. 10 sec on/ 30 sec off against a 10 # weight with patient in sitting to improve motor recruitment. Therapeutic Exercise Single leg stance without UE support 2 x 30sec B Stepping taps on 8 in step - x 10 bilterally without UE support. Cueing on foot positioning Tandem on airex ball toss - x25 B with intermittent UE support throughout exercise  Tandem stance on floor - 2 x 30 sec B without UE support Amb 1000 ft with cueing on improving stepping pattern, decreasing circumduction, and improving gait speed       PT Education - 10/02/16 0934    Education provided Yes   Education Details Educated on POC and expectations of therapy   Person(s) Educated Patient   Methods Explanation   Comprehension Verbalized understanding             PT Long Term Goals - 10/02/16 0814      PT LONG TERM GOAL #1   Title Pt will be independent with HEP in order to improve strength and balance in order to decrease fall risk and improve function at home and work.   Baseline 10/02/16: Mod A for exericise progression/ performance   Time 4   Period Weeks   Status On-going     PT LONG TERM GOAL #2   Title Pt will improve BERG by at least 3 points in order to demonstrate clinically significant improvement in balance.   Baseline 08/07/16: 49/56 10/02/16: 53/56    Time 8   Period Weeks   Status Achieved     PT LONG TERM GOAL #3   Title Pt will decrease TUG to below 14 seconds in order to demonstrate decreased fall risk    Baseline 08/07/16: 21.9 seconds; 10/02/16: 12.3    Time 8   Period Weeks   Status Achieved     PT LONG TERM GOAL #4   Title Pt will decrease 5TSTS by at least 3 seconds in order to demonstrate clinically significant improvement in LE strength   Baseline 08/07/16: 18.5 seconds; 10/02/16: 14.3   Time 8   Period Weeks  Status Achieved     PT LONG TERM GOAL #5   Title Pt will increase 10MWT by at least 0.13 m/s in order to demonstrate clinically significant improvement in community ambulation   Baseline 08/07/16: 0.53 m/s 10/02/16: .97 m/s   Time 8   Period Weeks   Status Achieved     Additional Long Term Goals   Additional Long Term Goals Yes     PT LONG TERM GOAL #6   Title Pt will be able to swing a golf club safely in order to complete 9 holes of golf   Baseline 10/02/16: Unable to swing golf club safely   Time 4   Period Weeks   Status On-going     PT LONG TERM GOAL #7   Title Pt will improve 6MWT by at least 148ft  to demonstrate improved aerobic capacity/endurance   Baseline 582 ft 10/02/16: 1114ft   Time 7   Period Weeks   Status Achieved     PT LONG TERM GOAL #8   Title Patient will improve DGI by 4 points to demonstrate improvement in dynamic gait/balance and decrease fall risk   Baseline 10/02/16: 19/24   Time 4   Period Weeks   Status New               Plan - 10/02/16 1251    Clinical Impression Statement Patient demonstrates significant improvement in TUG, 6MWT, 5xSTS, BERG, and 66m walk test indicating decreased fall risk and improved function. Although patient is improving, he continues to demonstrate increased fall risk with ambulating as indicated my DGI and decreased 79m walk test when ambulating. Patient will benefit from further skilled therapy to return to prior level of function.   Rehab  Potential Good   Clinical Impairments Affecting Rehab Potential Positive: motivation, prior functional level; Negative: no active dorsiflexion, increased LUE tone   PT Frequency 2x / week   PT Duration 4 weeks   PT Treatment/Interventions ADLs/Self Care Home Management;Biofeedback;Canalith Repostioning;Cryotherapy;Electrical Stimulation;Iontophoresis 4mg /ml Dexamethasone;Aquatic Therapy;Moist Heat;Traction;Ultrasound;Gait training;Stair training;Functional mobility training;Therapeutic activities;DME Instruction;Therapeutic exercise;Balance training;Neuromuscular re-education;Cognitive remediation;Patient/family education;Orthotic Fit/Training;Manual techniques;Passive range of motion;Energy conservation;Taping;Vestibular   PT Next Visit Plan Cl[place]; DGI, LEFS, NME for L anterior tibialis, Walk-aide, progress balance, strengthening, and HEP; Consider sidelying hip abduction exercises   PT Home Exercise Plan Sit to stand, seated tband resisted clams, seated adduction squeeze   Consulted and Agree with Plan of Care Patient      Patient will benefit from skilled therapeutic intervention in order to improve the following deficits and impairments:  Abnormal gait, Decreased balance, Decreased coordination, Decreased mobility, Decreased range of motion, Decreased strength, Difficulty walking, Impaired tone, Impaired UE functional use  Visit Diagnosis: Muscle weakness (generalized)  Other lack of coordination  Unsteadiness on feet     Problem List Patient Active Problem List   Diagnosis Date Noted  . Neurocognitive disorder   . Left foot drop   . Benign essential HTN   . Hemiparesis affecting nondominant side as late effect of cerebrovascular accident (CVA) ([place]) 07/18/2016  . Right basal ganglia embolic stroke ([place]) 123XX123  . CVA (cerebral infarction) 07/15/2016  . TIA (transient ischemic attack) 07/14/2016  . HTN, goal below 140/80 07/14/2016  . Left facial numbness 07/14/2016  .  Weakness of left side of body 07/14/2016    Blythe Stanford, PT DPT 10/02/2016, 12:57 PM  [place] MAIN [facility] SERVICES [address] [place], [place], [address] Phone: [phone]   Fax:  [phone]  Name: Saim Mcquary MRN: [id number] Date of Birth: 02-Oct-1956

## 2016-10-03 ENCOUNTER — Encounter: Payer: Self-pay | Admitting: Occupational Therapy

## 2016-10-03 NOTE — Therapy (Signed)
[place] MAIN [facility] SERVICES [address] [place], [place], [address] Phone: [phone]   Fax:  [phone]  Occupational Therapy Treatment  Patient Details  Name: Jason Oconnor MRN: [id number] Date of Birth: 1956/03/25 No Data Recorded  Encounter Date: 09/27/2016      OT End of Session - 10/03/16 2149    Visit Number 14   Number of Visits 24   Date for OT Re-Evaluation 11/01/16   OT Start Time 0845   OT Stop Time 0930   OT Time Calculation (min) 45 min   Activity Tolerance Patient tolerated treatment well   Behavior During Therapy [facility] for tasks assessed/performed      Past Medical History:  Diagnosis Date  . Bone marrow disease   . Hypertension     History reviewed. No pertinent surgical history.  There were no vitals filed for this visit.      Subjective Assessment - 10/03/16 2147    Subjective  Patient reports he forgot to take his papers last time to tell him how to do the contrast bath, will try it over the next few days.  Son and DIL with grandson are coming to visit this weekend.     Pertinent History Patient reports on July 13, 2016 he was helping a friend doing home repairs and felt tired, went home and started to notice problems moving around.  The next morning he feel trying to put his slippers on, wife transported him to ER and [facility] for 3 days and then went to [facility] for REhab and was discharged this past Saturday to home.     Patient Stated Goals "I want to get back to close as normal as possible, play golf again, work in the garage."     Currently in Pain? Yes   Pain Score 2    Pain Location Shoulder   Pain Orientation Left   Pain Descriptors / Indicators Jabbing   Pain Type Acute pain   Pain Onset 1 to 4 weeks ago   Multiple Pain Sites No                      OT Treatments/Exercises (OP) - 10/03/16 2153      Fine Motor Coordination   Other Fine Motor Exercises Patient seen for  manipulation of [place] discs from tabletop with cues for isolated finger movements to flip and manipulate disc to opposite side.  Finger ABD, ADD, tapping and oppositional movements with cues.     Neurological Re-education Exercises   Other Exercises 1 Patient seen for functional reaching tasks on wedge as well as wall slides with cues for arm and hand positioning.  Grasp and release of a variety of objects in standing and sitting, cues for form and technique.   Other Exercises 2 Attempts to obtain a weight bearing position of the wrist and hand however, patient lacks ability to obtain wrist extension without pain.                OT Education - 10/03/16 2148    Education provided Yes   Education Details positioning, edema control, edema glove, exercises   Person(s) Educated Patient   Methods Explanation;Demonstration;Verbal cues   Comprehension Verbal cues required;Returned demonstration;Verbalized understanding             OT Long Term Goals - 09/12/16 1536      OT LONG TERM GOAL #1   Title Patient to improve left UE coordination sufficient  to tie shoes with modified independence.    Baseline unable   Time 12   Period Weeks   Status On-going     OT LONG TERM GOAL #2   Title Patient to improve LUE coordination to complete buttons, snaps and zippers on clothing with modified independence.   Time 12   Period Weeks   Status On-going     OT LONG TERM GOAL #3   Title Patient to complete bathing with modified independence including using left arm to wash right side of body.    Time 12   Period Weeks   Status On-going     OT LONG TERM GOAL #4   Title Patient to improve left hand use to type on computer using both hands on home row keys, 2 lines with no errors in 5 minutes.    Baseline unable   Time 12   Period Weeks   Status On-going     OT LONG TERM GOAL #5   Title Patient will improve grip on left hand by 10# to stabilize jars and containers for opening.     Baseline unable   Time 12   Period Weeks   Status On-going     OT LONG TERM GOAL #6   Title Patient will be able to demonstrate sweeping with use of bilateral UEs with modified independence.    Baseline unable   Time 12   Period Weeks   Status On-going               Plan - 10/03/16 2150    Clinical Impression Statement Patient has not been able to do constrast baths at home, continues to work towards positioning, activation of muscle pump with functional exercises and hand movements, retrograde massage.  He continues to progress with quality of movements however continues to require cues to decrease substitution of movement and compensatory movements.  He is able to demonstrate increased repetitions of exercises and transitioning more from supine exercises to sitting as well. Continue to work towards goals.    Rehab Potential Good   OT Frequency 2x / week   OT Duration 12 weeks   OT Treatment/Interventions Self-care/ADL training;Therapeutic exercise;Neuromuscular education;Visual/perceptual remediation/compensation;Therapist, nutritional;Therapeutic exercises;Patient/family education;DME and/or AE instruction;Manual Therapy;Therapeutic activities;Balance training   Consulted and Agree with Plan of Care Patient      Patient will benefit from skilled therapeutic intervention in order to improve the following deficits and impairments:  Decreased knowledge of use of DME, Decreased coordination, Impaired sensation, Decreased activity tolerance, Decreased range of motion, Decreased strength, Decreased balance, Difficulty walking, Impaired UE functional use  Visit Diagnosis: Muscle weakness (generalized)  Other lack of coordination    Problem List Patient Active Problem List   Diagnosis Date Noted  . Neurocognitive disorder   . Left foot drop   . Benign essential HTN   . Hemiparesis affecting nondominant side as late effect of cerebrovascular accident (CVA) ([place])  07/18/2016  . Right basal ganglia embolic stroke ([place]) 123XX123  . CVA (cerebral infarction) 07/15/2016  . TIA (transient ischemic attack) 07/14/2016  . HTN, goal below 140/80 07/14/2016  . Left facial numbness 07/14/2016  . Weakness of left side of body 07/14/2016   Kolby Myung Oneita Jolly, OTR/L, CLT  Hondo Nanda 10/03/2016, 9:58 PM  [place] MAIN [facility] SERVICES [address] [place], [place], [address] Phone: [phone]   Fax:  [phone]  Name: Trumaine Musselman MRN: [id number] Date of Birth: [birth year]

## 2016-10-03 NOTE — Therapy (Deleted)
[place] MAIN [facility] SERVICES [address] [place], [place], [address] Phone: [phone]   Fax:  [phone]  Occupational Therapy Evaluation  Patient Details  Name: Jason Oconnor MRN: [id number] Date of Birth: May 01, 1956 No Data Recorded  Encounter Date: 09/27/2016      OT End of Session - 10/03/16 2149    Visit Number 14   Number of Visits 24   Date for OT Re-Evaluation 11/01/16   OT Start Time 0845   OT Stop Time 0930   OT Time Calculation (min) 45 min   Activity Tolerance Patient tolerated treatment well   Behavior During Therapy [facility] for tasks assessed/performed      Past Medical History:  Diagnosis Date  . Bone marrow disease   . Hypertension     History reviewed. No pertinent surgical history.  There were no vitals filed for this visit.      Subjective Assessment - 10/03/16 2147    Subjective  Patient reports he forgot to take his papers last time to tell him how to do the contrast bath, will try it over the next few days.  Son and DIL with grandson are coming to visit this weekend.     Pertinent History Patient reports on July 13, 2016 he was helping a friend doing home repairs and felt tired, went home and started to notice problems moving around.  The next morning he feel trying to put his slippers on, wife transported him to ER and [facility] for 3 days and then went to [facility] for REhab and was discharged this past Saturday to home.     Patient Stated Goals "I want to get back to close as normal as possible, play golf again, work in the garage."     Currently in Pain? Yes   Pain Score 2    Pain Location Shoulder   Pain Orientation Left   Pain Descriptors / Indicators Jabbing   Pain Type Acute pain   Pain Onset 1 to 4 weeks ago   Multiple Pain Sites No                     OT Treatments/Exercises (OP) - 10/03/16 2153      Fine Motor Coordination   Other Fine Motor Exercises Patient seen for  manipulation of [place] discs from tabletop with cues for isolated finger movements to flip and manipulate disc to opposite side.  Finger ABD, ADD, tapping and oppositional movements with cues.     Neurological Re-education Exercises   Other Exercises 1 Patient seen for functional reaching tasks on wedge as well as wall slides with cues for arm and hand positioning.  Grasp and release of a variety of objects in standing and sitting, cues for form and technique.   Other Exercises 2 Attempts to obtain a weight bearing position of the wrist and hand however, patient lacks ability to obtain wrist extension without pain.               OT Education - 10/03/16 2148    Education provided Yes   Education Details positioning, edema control, edema glove, exercises   Person(s) Educated Patient   Methods Explanation;Demonstration;Verbal cues   Comprehension Verbal cues required;Returned demonstration;Verbalized understanding             OT Long Term Goals - 09/12/16 1536      OT LONG TERM GOAL #1   Title Patient to improve left UE coordination sufficient to tie  shoes with modified independence.    Baseline unable   Time 12   Period Weeks   Status On-going     OT LONG TERM GOAL #2   Title Patient to improve LUE coordination to complete buttons, snaps and zippers on clothing with modified independence.   Time 12   Period Weeks   Status On-going     OT LONG TERM GOAL #3   Title Patient to complete bathing with modified independence including using left arm to wash right side of body.    Time 12   Period Weeks   Status On-going     OT LONG TERM GOAL #4   Title Patient to improve left hand use to type on computer using both hands on home row keys, 2 lines with no errors in 5 minutes.    Baseline unable   Time 12   Period Weeks   Status On-going     OT LONG TERM GOAL #5   Title Patient will improve grip on left hand by 10# to stabilize jars and containers for opening.     Baseline unable   Time 12   Period Weeks   Status On-going     OT LONG TERM GOAL #6   Title Patient will be able to demonstrate sweeping with use of bilateral UEs with modified independence.    Baseline unable   Time 12   Period Weeks   Status On-going               Plan - 10/03/16 2150    Clinical Impression Statement Patient has not been able to do constrast baths at home, continues to work towards positioning, activation of muscle pump with functional exercises and hand movements, retrograde massage.  He continues to progress with quality of movements however continues to require cues to decrease substitution of movement and compensatory movements.  He is able to demonstrate increased repetitions of exercises and transitioning more from supine exercises to sitting as well. Continue to work towards goals.    Rehab Potential Good   OT Frequency 2x / week   OT Duration 12 weeks   OT Treatment/Interventions Self-care/ADL training;Therapeutic exercise;Neuromuscular education;Visual/perceptual remediation/compensation;Therapist, nutritional;Therapeutic exercises;Patient/family education;DME and/or AE instruction;Manual Therapy;Therapeutic activities;Balance training   Consulted and Agree with Plan of Care Patient      Patient will benefit from skilled therapeutic intervention in order to improve the following deficits and impairments:  Decreased knowledge of use of DME, Decreased coordination, Impaired sensation, Decreased activity tolerance, Decreased range of motion, Decreased strength, Decreased balance, Difficulty walking, Impaired UE functional use  Visit Diagnosis: Muscle weakness (generalized)  Other lack of coordination    Problem List Patient Active Problem List   Diagnosis Date Noted  . Neurocognitive disorder   . Left foot drop   . Benign essential HTN   . Hemiparesis affecting nondominant side as late effect of cerebrovascular accident (CVA) ([place])  07/18/2016  . Right basal ganglia embolic stroke ([place]) 123XX123  . CVA (cerebral infarction) 07/15/2016  . TIA (transient ischemic attack) 07/14/2016  . HTN, goal below 140/80 07/14/2016  . Left facial numbness 07/14/2016  . Weakness of left side of body 07/14/2016    Veroncia Jezek 10/03/2016, 9:58 PM  [place] MAIN [facility] SERVICES [address] [place], [place], [address] Phone: [phone]   Fax:  [phone]  Name: Jason Oconnor MRN: [id number] Date of Birth: [birth year]

## 2016-10-04 ENCOUNTER — Ambulatory Visit: Payer: 59 | Admitting: Occupational Therapy

## 2016-10-04 ENCOUNTER — Ambulatory Visit: Payer: 59

## 2016-10-04 ENCOUNTER — Encounter: Payer: Self-pay | Admitting: Occupational Therapy

## 2016-10-04 DIAGNOSIS — M6281 Muscle weakness (generalized): Secondary | ICD-10-CM

## 2016-10-04 DIAGNOSIS — R278 Other lack of coordination: Secondary | ICD-10-CM

## 2016-10-04 DIAGNOSIS — R2681 Unsteadiness on feet: Secondary | ICD-10-CM

## 2016-10-04 NOTE — Therapy (Signed)
[place] MAIN [facility] SERVICES [address] [place], [place], [address] Phone: [phone]   Fax:  [phone]  Physical Therapy Treatment  Patient Details  Name: Jason Oconnor MRN: [id number] Date of Birth: [birth year] Referring Provider: Dr. Naaman Plummer  Encounter Date: 10/04/2016      PT End of Session - 10/04/16 0834    Visit Number 15   Number of Visits 25   Date for PT Re-Evaluation 10/30/16   Authorization Type no g codes   PT Start Time 0800   PT Stop Time 0845   PT Time Calculation (min) 45 min   Equipment Utilized During Treatment Gait belt   Activity Tolerance Patient tolerated treatment well   Behavior During Therapy [facility] for tasks assessed/performed      Past Medical History:  Diagnosis Date  . Bone marrow disease   . Hypertension     History reviewed. No pertinent surgical history.  There were no vitals filed for this visit.      Subjective Assessment - 10/04/16 0810    Subjective Patient reports he's been walking around the house and outside. States he's feeling stronger.    Pertinent History Patient is a 60 year old right-handed male with history of hypertension. Per chart review patient is married. 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 Presented to [facility] 07/14/2016 with left-sided weakness and facial droop. Blood pressure elevated 170/106. Head CT scan negative. 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 CTA of the head showed no emergent large vessel occlusion or severe stenosis. Carotid Dopplers with no ICA stenosis. Patient did not receive TPA. Echocardiogram with ejection fraction of 60%. Normal systolic function. Placed on aspirin and Plavix for CVA prophylaxis. Subcutaneous Lovenox for DVT prophylaxis.  Patient transferred to  [facility]IR on 07/16/2016. Pt discharged form CIR on 08/03/16. Pt reports that he made progress at CIR. No reported problems since returning home. Currently using a wide base quad cane in RUE and L AFO. Pt reports that he has made the last progress with respect to his LUE strength.    Patient Stated Goals "Get back as close as I can to normal." Pt would to be able to play golf. Pt would like to return to some kind of work.          TREATMENT:  Modalities NMES pulse width 380uS, Intensity 44, Rate: 50Hz , waveform: asymmetrical 8 minutes total time. 10 sec on/ 30 sec off against a 10 # weight with patient in sitting to improve motor recruitment. Therapeutic Exercise Tandem on airex ball toss - x25 B with intermittent UE support throughout exercise  Tandem on airex trunk rotations - 2 x 10 B Stepping lunges on Bosu - x 15 Side stepping up and over Bosu - x15 Cone taps from airex pad -- (3 cones) 2 x 8 B Heel raises off side of the airex pad -  x 15  Hip abduction on airex pad - 2 x 20        PT Education - 10/04/16 [id number]    Education provided Yes   Education Details Form/technique with exercise    Person(s) Educated Patient   Methods Explanation;Demonstration   Comprehension Verbalized understanding;Returned demonstration             PT Long Term Goals - 10/02/16 0814      PT LONG TERM  GOAL #1   Title Pt will be independent with HEP in order to improve strength and balance in order to decrease fall risk and improve function at home and work.   Baseline 10/02/16: Mod A for exericise progression/ performance   Time 4   Period Weeks   Status On-going     PT LONG TERM GOAL #2   Title Pt will improve BERG by at least 3 points in order to demonstrate clinically significant improvement in balance.   Baseline 08/07/16: 49/56 10/02/16: 53/56   Time 8   Period Weeks   Status Achieved     PT LONG TERM GOAL #3   Title Pt will decrease TUG to below 14 seconds in order to demonstrate decreased  fall risk    Baseline 08/07/16: 21.9 seconds; 10/02/16: 12.3    Time 8   Period Weeks   Status Achieved     PT LONG TERM GOAL #4   Title Pt will decrease 5TSTS by at least 3 seconds in order to demonstrate clinically significant improvement in LE strength   Baseline 08/07/16: 18.5 seconds; 10/02/16: 14.3   Time 8   Period Weeks   Status Achieved     PT LONG TERM GOAL #5   Title Pt will increase 10MWT by at least 0.13 m/s in order to demonstrate clinically significant improvement in community ambulation   Baseline 08/07/16: 0.53 m/s 10/02/16: .97 m/s   Time 8   Period Weeks   Status Achieved     Additional Long Term Goals   Additional Long Term Goals Yes     PT LONG TERM GOAL #6   Title Pt will be able to swing a golf club safely in order to complete 9 holes of golf   Baseline 10/02/16: Unable to swing golf club safely   Time 4   Period Weeks   Status On-going     PT LONG TERM GOAL #7   Title Pt will improve 6MWT by at least 150ft  to demonstrate improved aerobic capacity/endurance   Baseline 582 ft 10/02/16: 1165ft   Time 7   Period Weeks   Status Achieved     PT LONG TERM GOAL #8   Title Patient will improve DGI by 4 points to demonstrate improvement in dynamic gait/balance and decrease fall risk   Baseline 10/02/16: 19/24   Time 4   Period Weeks   Status New               Plan - 10/04/16 0837    Clinical Impression Statement Patient requires UE support to perform exercises indicating decreased dynamic and static balance. Focused today's session on improving single leg balance and improving muscle activation on the L LE. Patient will benefit from further skilled therapy focused on improving LE strength, coordination, and endurance to return to prior level of function.     Rehab Potential Good   Clinical Impairments Affecting Rehab Potential Positive: motivation, prior functional level; Negative: no active dorsiflexion, increased LUE tone   PT Frequency 2x / week   PT  Duration 4 weeks   PT Treatment/Interventions ADLs/Self Care Home Management;Biofeedback;Canalith Repostioning;Cryotherapy;Electrical Stimulation;Iontophoresis 4mg /ml Dexamethasone;Aquatic Therapy;Moist Heat;Traction;Ultrasound;Gait training;Stair training;Functional mobility training;Therapeutic activities;DME Instruction;Therapeutic exercise;Balance training;Neuromuscular re-education;Cognitive remediation;Patient/family education;Orthotic Fit/Training;Manual techniques;Passive range of motion;Energy conservation;Taping;Vestibular   PT Next Visit Plan Clamshell; DGI, LEFS, NME for L anterior tibialis, Walk-aide, progress balance, strengthening, and HEP; Consider sidelying hip abduction exercises   PT Home Exercise Plan Sit to stand, seated tband resisted clams, seated adduction squeeze  Consulted and Agree with Plan of Care Patient      Patient will benefit from skilled therapeutic intervention in order to improve the following deficits and impairments:  Abnormal gait, Decreased balance, Decreased coordination, Decreased mobility, Decreased range of motion, Decreased strength, Difficulty walking, Impaired tone, Impaired UE functional use  Visit Diagnosis: Muscle weakness (generalized)  Other lack of coordination  Unsteadiness on feet     Problem List Patient Active Problem List   Diagnosis Date Noted  . Neurocognitive disorder   . Left foot drop   . Benign essential HTN   . Hemiparesis affecting nondominant side as late effect of cerebrovascular accident (CVA) ([place]) 07/18/2016  . Right basal ganglia embolic stroke ([place]) 123XX123  . CVA (cerebral infarction) 07/15/2016  . TIA (transient ischemic attack) 07/14/2016  . HTN, goal below 140/80 07/14/2016  . Left facial numbness 07/14/2016  . Weakness of left side of body 07/14/2016    Blythe Stanford, PT DPT 10/04/2016, 8:46 AM  [place] MAIN [facility] SERVICES [address] [place],  [place], [address] Phone: [phone]   Fax:  [phone]  Name: Jason Oconnor MRN: [id number] Date of Birth: 1956-05-21

## 2016-10-04 NOTE — Therapy (Signed)
[place] MAIN [facility] SERVICES [address] [place], [place], [address] Phone: [phone]   Fax:  [phone]  Occupational Therapy Treatment  Patient Details  Name: Jason Oconnor MRN: [id number] Date of Birth: [birth year] No Data Recorded  Encounter Date: 10/02/2016      OT End of Session - 10/04/16 2019    Visit Number 15   Number of Visits 24   Date for OT Re-Evaluation 11/01/16   OT Start Time 0845   OT Stop Time 0935   OT Time Calculation (min) 50 min   Activity Tolerance Patient tolerated treatment well   Behavior During Therapy [facility] for tasks assessed/performed      Past Medical History:  Diagnosis Date  . Bone marrow disease   . Hypertension     History reviewed. No pertinent surgical history.  There were no vitals filed for this visit.      Subjective Assessment - 10/04/16 2001    Subjective  Patient reports his neck is hurting some today, knee was hurting when he got up this morning but better now.    Pertinent History Patient reports on July 13, 2016 he was helping a friend doing home repairs and felt tired, went home and started to notice problems moving around.  The next morning he feel trying to put his slippers on, wife transported him to ER and [facility] for 3 days and then went to [facility] for REhab and was discharged this past Saturday to home.     Patient Stated Goals "I want to get back to close as normal as possible, play golf again, work in the garage."     Currently in Pain? Yes   Pain Score 3    Pain Location Neck   Pain Orientation Left   Pain Descriptors / Indicators Aching   Pain Type Acute pain   Pain Onset In the past 7 days   Pain Frequency Intermittent   Multiple Pain Sites No                      OT Treatments/Exercises (OP) - 10/04/16 2003      ADLs   ADL Comments Patient able to don and doff medium edema glove, it is difficult to put on but he is able to do it given increased  time.      Fine Motor Coordination   Other Fine Motor Exercises Patient seen this date for oppositional movements of thumb to index, middle finger, unable to perform to ring and small fingers.  Manipulation of 100 pegboard/Judy pegs with cues for prehension patterns and isolated movements, dropping multiple items but demonstrates increased isolated movement.       Neurological Re-education Exercises   Other Exercises 1 Dowel ladder in sitting 7 levels up and down, rung by rung, then from bottom to mid point for 10 reps, from bottom to top for 10 reps, cues to decrease compensatory movements at the left shoulder.  Added 2.5# weight and performed top to bottom for 5 reps for 2 sets.  Wrist stretches to left wrist and hand, carpal stretches, spreads.                OT Education - 10/04/16 2018    Education provided Yes   Education Details HEP, isolation of movements of digits.    Person(s) Educated Patient   Methods Explanation;Demonstration;Verbal cues   Comprehension Verbal cues required;Returned demonstration;Verbalized understanding  OT Long Term Goals - 09/12/16 1536      OT LONG TERM GOAL #1   Title Patient to improve left UE coordination sufficient to tie shoes with modified independence.    Baseline unable   Time 12   Period Weeks   Status On-going     OT LONG TERM GOAL #2   Title Patient to improve LUE coordination to complete buttons, snaps and zippers on clothing with modified independence.   Time 12   Period Weeks   Status On-going     OT LONG TERM GOAL #3   Title Patient to complete bathing with modified independence including using left arm to wash right side of body.    Time 12   Period Weeks   Status On-going     OT LONG TERM GOAL #4   Title Patient to improve left hand use to type on computer using both hands on home row keys, 2 lines with no errors in 5 minutes.    Baseline unable   Time 12   Period Weeks   Status On-going     OT  LONG TERM GOAL #5   Title Patient will improve grip on left hand by 10# to stabilize jars and containers for opening.    Baseline unable   Time 12   Period Weeks   Status On-going     OT LONG TERM GOAL #6   Title Patient will be able to demonstrate sweeping with use of bilateral UEs with modified independence.    Baseline unable   Time 12   Period Weeks   Status On-going               Plan - 10/04/16 2020    Clinical Impression Statement Patient complains of wrist pain towards end of session with wrist extension, reports it also bothers him at night.  Reports muscle spasms at night and has difficulty getting comfortable.  Patient able to demo improved isolated finger movements on left to manipulate items better however, has wrist pain at times.  Next session will plan to reassess 9 hole peg test, wrist stretches and mobs to decrease pain.     Rehab Potential Good   OT Frequency 2x / week   OT Duration 12 weeks   OT Treatment/Interventions Self-care/ADL training;Therapeutic exercise;Neuromuscular education;Visual/perceptual remediation/compensation;Therapist, nutritional;Therapeutic exercises;Patient/family education;DME and/or AE instruction;Manual Therapy;Therapeutic activities;Balance training   Consulted and Agree with Plan of Care Patient      Patient will benefit from skilled therapeutic intervention in order to improve the following deficits and impairments:  Decreased knowledge of use of DME, Decreased coordination, Impaired sensation, Decreased activity tolerance, Decreased range of motion, Decreased strength, Decreased balance, Difficulty walking, Impaired UE functional use  Visit Diagnosis: Muscle weakness (generalized)  Other lack of coordination    Problem List Patient Active Problem List   Diagnosis Date Noted  . Neurocognitive disorder   . Left foot drop   . Benign essential HTN   . Hemiparesis affecting nondominant side as late effect of  cerebrovascular accident (CVA) ([place]) 07/18/2016  . Right basal ganglia embolic stroke ([place]) 123XX123  . CVA (cerebral infarction) 07/15/2016  . TIA (transient ischemic attack) 07/14/2016  . HTN, goal below 140/80 07/14/2016  . Left facial numbness 07/14/2016  . Weakness of left side of body 07/14/2016   Lanessa Shill Oneita Jolly, OTR/L, CLT  Glorianna Gott 10/04/2016, 8:24 PM  [place] MAIN [facility] SERVICES [address] [place], [place], [address] Phone: [phone]  Fax:  [phone]  Name: Juddson Mascarenas MRN: [id number] Date of Birth: 12/17/1955

## 2016-10-05 ENCOUNTER — Encounter: Payer: Self-pay | Admitting: Occupational Therapy

## 2016-10-05 NOTE — Therapy (Signed)
[place] MAIN [facility] SERVICES [address] [place], [place], [address] Phone: [phone]   Fax:  [phone]  Occupational Therapy Treatment  Patient Details  Name: Jason Oconnor MRN: [id number] Date of Birth: Jan 04, 1956 No Data Recorded  Encounter Date: 10/04/2016      OT End of Session - 10/05/16 1543    Visit Number 16   Number of Visits 24   Date for OT Re-Evaluation 11/01/16   OT Start Time 0845   OT Stop Time 0932   OT Time Calculation (min) 47 min   Activity Tolerance Patient tolerated treatment well   Behavior During Therapy [facility] for tasks assessed/performed      Past Medical History:  Diagnosis Date  . Bone marrow disease   . Hypertension     History reviewed. No pertinent surgical history.  There were no vitals filed for this visit.      Subjective Assessment - 10/05/16 1533    Subjective  Patient reports his knees were hurting earlier but feels better now.  Continues to have spasms at night in the left UE.     Pertinent History Patient reports on July 13, 2016 he was helping a friend doing home repairs and felt tired, went home and started to notice problems moving around.  The next morning he feel trying to put his slippers on, wife transported him to ER and [facility] for 3 days and then went to [facility] for REhab and was discharged this past Saturday to home.     Patient Stated Goals "I want to get back to close as normal as possible, play golf again, work in the garage."     Currently in Pain? Yes   Pain Score 3    Pain Location Wrist   Pain Orientation Left   Pain Descriptors / Indicators Aching;Jabbing   Pain Type Acute pain   Pain Onset In the past 7 days   Pain Frequency Intermittent   Multiple Pain Sites No                      OT Treatments/Exercises (OP) - 10/05/16 1535      ADLs   ADL Comments Patient continues to demo difficulty with pants at times with belt and fasteners, difficulty  with sweeping and typing especially when trying to perform control/alt/delete keys at the same time to log onto and off computer.      Fine Motor Coordination   Other Fine Motor Exercises Patient seen for reassessment of  9 hole peg test 1 min 11 sec compared to evaluation when patient could not pick up peg or perform test at all.  Seen for instruction and demonstration of manipulation of cards for flipping using thumb finger combinations, some increased difficulty noted with thumb on top of card, slow to complete and needs to work towards increased speed and dexterity.  Instructed on ways to incorporate into home program.     Neurological Re-education Exercises   Other Exercises 1 Shape tower in standing with left hand moving stack one at a time to the next level, cues for lateral pinch grasp and then full finger circular grasp.  Finger ABD painful today especially at the wrist.  Reassessed lateral pinch 9#, 3 point pinch 7#.Grip stregnth on L 8#.  Shoulder flexion in sitting 128 degrees, supination 60 degrees active of left forearm.  Full finger extension however has pain at the wrist.  OT Education - 10/05/16 1542    Education provided Yes   Education Details coordination exercises, HEP   Person(s) Educated Patient   Methods Explanation;Demonstration;Verbal cues   Comprehension Verbal cues required;Returned demonstration;Verbalized understanding             OT Long Term Goals - 09/12/16 1536      OT LONG TERM GOAL #1   Title Patient to improve left UE coordination sufficient to tie shoes with modified independence.    Baseline unable   Time 12   Period Weeks   Status On-going     OT LONG TERM GOAL #2   Title Patient to improve LUE coordination to complete buttons, snaps and zippers on clothing with modified independence.   Time 12   Period Weeks   Status On-going     OT LONG TERM GOAL #3   Title Patient to complete bathing with modified independence  including using left arm to wash right side of body.    Time 12   Period Weeks   Status On-going     OT LONG TERM GOAL #4   Title Patient to improve left hand use to type on computer using both hands on home row keys, 2 lines with no errors in 5 minutes.    Baseline unable   Time 12   Period Weeks   Status On-going     OT LONG TERM GOAL #5   Title Patient will improve grip on left hand by 10# to stabilize jars and containers for opening.    Baseline unable   Time 12   Period Weeks   Status On-going     OT LONG TERM GOAL #6   Title Patient will be able to demonstrate sweeping with use of bilateral UEs with modified independence.    Baseline unable   Time 12   Period Weeks   Status On-going               Plan - 10/05/16 1543    Clinical Impression Statement Patient able to tolerate wrist stretches but continues to complain of pain in this area, which could be due to edema in hand and wrist, left wrist about .4 cm larger than right.  Patient now able to perform 9 hole peg test in one minute 11 secs when he was unable to perform at evaluation.  His coordination continues to improve for manipulation skills.  Continue to monitor pain, patient has MD appt next week and plans to address.   Rehab Potential Good   OT Frequency 2x / week   OT Duration 12 weeks   OT Treatment/Interventions Self-care/ADL training;Therapeutic exercise;Neuromuscular education;Visual/perceptual remediation/compensation;Therapist, nutritional;Therapeutic exercises;Patient/family education;DME and/or AE instruction;Manual Therapy;Therapeutic activities;Balance training   Consulted and Agree with Plan of Care Patient      Patient will benefit from skilled therapeutic intervention in order to improve the following deficits and impairments:  Decreased knowledge of use of DME, Decreased coordination, Impaired sensation, Decreased activity tolerance, Decreased range of motion, Decreased strength,  Decreased balance, Difficulty walking, Impaired UE functional use  Visit Diagnosis: Muscle weakness (generalized)  Other lack of coordination    Problem List Patient Active Problem List   Diagnosis Date Noted  . Neurocognitive disorder   . Left foot drop   . Benign essential HTN   . Hemiparesis affecting nondominant side as late effect of cerebrovascular accident (CVA) ([place]) 07/18/2016  . Right basal ganglia embolic stroke ([place]) 123XX123  . CVA (cerebral infarction) 07/15/2016  . TIA (  transient ischemic attack) 07/14/2016  . HTN, goal below 140/80 07/14/2016  . Left facial numbness 07/14/2016  . Weakness of left side of body 07/14/2016   Hitoshi Werts Oneita Jolly, OTR/L, CLT  Ruey Storer 10/05/2016, 3:47 PM  [place] MAIN [facility] SERVICES [address] [place], [place], [address] Phone: [phone]   Fax:  [phone]  Name: Jason Oconnor MRN: [id number] Date of Birth: 1956/05/27

## 2016-10-09 ENCOUNTER — Ambulatory Visit: Payer: 59

## 2016-10-09 ENCOUNTER — Ambulatory Visit: Payer: 59 | Admitting: Occupational Therapy

## 2016-10-09 DIAGNOSIS — M6281 Muscle weakness (generalized): Secondary | ICD-10-CM | POA: Diagnosis not present

## 2016-10-09 DIAGNOSIS — R2681 Unsteadiness on feet: Secondary | ICD-10-CM

## 2016-10-09 DIAGNOSIS — R278 Other lack of coordination: Secondary | ICD-10-CM

## 2016-10-09 NOTE — Therapy (Signed)
[place] MAIN [facility] SERVICES [address] [place], [place], [address] Phone: [phone]   Fax:  [phone]  Physical Therapy Treatment  Patient Details  Name: Jason Oconnor MRN: [id number] Date of Birth: 01-05-1956 Referring Provider: Dr. Naaman Plummer  Encounter Date: 10/09/2016      PT End of Session - 10/09/16 0819    Visit Number 16   Number of Visits 25   Date for PT Re-Evaluation 10/30/16   Authorization Type no g codes   PT Start Time 0802   PT Stop Time 0845   PT Time Calculation (min) 43 min   Equipment Utilized During Treatment Gait belt   Activity Tolerance Patient tolerated treatment well   Behavior During Therapy [facility] for tasks assessed/performed      Past Medical History:  Diagnosis Date  . Bone marrow disease   . Hypertension     History reviewed. No pertinent surgical history.  There were no vitals filed for this visit.      Subjective Assessment - 10/09/16 0811    Subjective Patient reports he was able to go bowling over the weekend with minimal difficulty.     Pertinent History Patient is a 60 year old right-handed male with history of hypertension. Per chart review patient is married. 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 Presented to [facility] 07/14/2016 with left-sided weakness and facial droop. Blood pressure elevated 170/106. Head CT scan negative. 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 CTA of the head showed no emergent large vessel occlusion or severe stenosis. Carotid Dopplers with no ICA stenosis. Patient did not receive TPA. Echocardiogram with ejection fraction of 60%. Normal systolic function. Placed on aspirin and Plavix for CVA prophylaxis. Subcutaneous Lovenox for DVT prophylaxis.  Patient transferred to CIR on 07/16/2016.  Pt discharged form CIR on 08/03/16. Pt reports that he made progress at CIR. No reported problems since returning home. Currently using a wide base quad cane in RUE and L AFO. Pt reports that he has made the last progress with respect to his LUE strength.    Patient Stated Goals "Get back as close as I can to normal." Pt would to be able to play golf. Pt would like to return to some kind of work.    Currently in Pain? Yes   Pain Score 3    Pain Location Shoulder   Pain Orientation Left   Pain Descriptors / Indicators Aching   Pain Type Acute pain   Pain Frequency Intermittent      TREATMENT:  Gait analysis: Decreased foot clearance on the L side, decreased heel strike, and increased left circumduction  Modalities  NMES pulse width 380uS, Intensity 44, Rate: 50Hz , waveform: asymmetrical 8 minutes total time. 10 sec on/ 30 sec off against a 10 # weight with patient in sitting to improve motor recruitment.  Therapeutic Exercise  Tandem on airex ball toss - x25 B with intermittent UE support throughout exercise  Rockerboard weight shifts with attempts to balance at top position - x20  Side stepping up and over Bosu -?x15  Retro step with for forward and backwards weight shifting - x20 ( with a step - x20)  Stepping lunges onto Bosu -x 20  Heel raises off of blue half foam - x 15  Tandem on airex trunk rotations -?x15 B  Cone taps from airex  pad -- (2 cones) opposite leg opposite cone - x15 B         PT Education - 10/09/16 0817    Education provided Yes   Education Details Form and techniques throughout exercise session    Person(s) Educated Patient   Methods Explanation;Demonstration   Comprehension Verbalized understanding;Returned demonstration             PT Long Term Goals - 10/02/16 [id number]      PT LONG TERM GOAL #1   Title Pt will be independent with HEP in order to improve strength and balance in order to decrease fall risk and improve function at home and work.    Baseline 10/02/16: Mod A for exericise progression/ performance   Time 4   Period Weeks   Status On-going     PT LONG TERM GOAL #2   Title Pt will improve BERG by at least 3 points in order to demonstrate clinically significant improvement in balance.   Baseline 08/07/16: 49/56 10/02/16: 53/56   Time 8   Period Weeks   Status Achieved     PT LONG TERM GOAL #3   Title Pt will decrease TUG to below 14 seconds in order to demonstrate decreased fall risk    Baseline 08/07/16: 21.9 seconds; 10/02/16: 12.3    Time 8   Period Weeks   Status Achieved     PT LONG TERM GOAL #4   Title Pt will decrease 5TSTS by at least 3 seconds in order to demonstrate clinically significant improvement in LE strength   Baseline 08/07/16: 18.5 seconds; 10/02/16: 14.3   Time 8   Period Weeks   Status Achieved     PT LONG TERM GOAL #5   Title Pt will increase 10MWT by at least 0.13 m/s in order to demonstrate clinically significant improvement in community ambulation   Baseline 08/07/16: 0.53 m/s 10/02/16: .97 m/s   Time 8   Period Weeks   Status Achieved     Additional Long Term Goals   Additional Long Term Goals Yes     PT LONG TERM GOAL #6   Title Pt will be able to swing a golf club safely in order to complete 9 holes of golf   Baseline 10/02/16: Unable to swing golf club safely   Time 4   Period Weeks   Status On-going     PT LONG TERM GOAL #7   Title Pt will improve 6MWT by at least 176ft  to demonstrate improved aerobic capacity/endurance   Baseline 582 ft 10/02/16: 1168ft   Time 7   Period Weeks   Status Achieved     PT LONG TERM GOAL #8   Title Patient will improve DGI by 4 points to demonstrate improvement in dynamic gait/balance and decrease fall risk   Baseline 10/02/16: 19/24   Time 4   Period Weeks   Status New               Plan - 10/09/16 [id number]    Clinical Impression Statement Focused on improving quad control, improving gastrocnemius power, and improving circumduction to  better improve gait pattern on the left side. Patient demonstrates improvement in stepping pattern on the left after multiple VC's and repetition of movement. Patient will benefit from further skilled therapy focused on improving LE balance and strength to improve function with ADLs.     Rehab Potential Good   Clinical Impairments Affecting Rehab Potential Positive: motivation, prior functional level; Negative: no active dorsiflexion, increased  LUE tone   PT Frequency 2x / week   PT Duration 4 weeks   PT Treatment/Interventions ADLs/Self Care Home Management;Biofeedback;Canalith Repostioning;Cryotherapy;Electrical Stimulation;Iontophoresis 4mg /ml Dexamethasone;Aquatic Therapy;Moist Heat;Traction;Ultrasound;Gait training;Stair training;Functional mobility training;Therapeutic activities;DME Instruction;Therapeutic exercise;Balance training;Neuromuscular re-education;Cognitive remediation;Patient/family education;Orthotic Fit/Training;Manual techniques;Passive range of motion;Energy conservation;Taping;Vestibular   PT Next Visit Plan Clamshell; DGI, LEFS, NME for L anterior tibialis, Walk-aide, progress balance, strengthening, and HEP; Consider sidelying hip abduction exercises   PT Home Exercise Plan Sit to stand, seated tband resisted clams, seated adduction squeeze   Consulted and Agree with Plan of Care Patient      Patient will benefit from skilled therapeutic intervention in order to improve the following deficits and impairments:  Abnormal gait, Decreased balance, Decreased coordination, Decreased mobility, Decreased range of motion, Decreased strength, Difficulty walking, Impaired tone, Impaired UE functional use  Visit Diagnosis: Muscle weakness (generalized)  Other lack of coordination  Unsteadiness on feet     Problem List Patient Active Problem List   Diagnosis Date Noted  . Neurocognitive disorder   . Left foot drop   . Benign essential HTN   . Hemiparesis affecting  nondominant side as late effect of cerebrovascular accident (CVA) ([place]) 07/18/2016  . Right basal ganglia embolic stroke ([place]) [id number]  . CVA (cerebral infarction) 07/15/2016  . TIA (transient ischemic attack) 07/14/2016  . HTN, goal below 140/80 07/14/2016  . Left facial numbness 07/14/2016  . Weakness of left side of body 07/14/2016    Blythe Stanford, PT DPT 10/09/2016, 9:51 AM  [place] MAIN [facility] SERVICES [address] [place], [place], [address] Phone: [phone]   Fax:  [phone]  Name: Demarie Zehr MRN: [id number] Date of Birth: 08/09/1956

## 2016-10-10 ENCOUNTER — Ambulatory Visit (HOSPITAL_BASED_OUTPATIENT_CLINIC_OR_DEPARTMENT_OTHER): Payer: 59 | Admitting: Physical Medicine & Rehabilitation

## 2016-10-10 ENCOUNTER — Encounter: Payer: 59 | Attending: Physical Medicine & Rehabilitation

## 2016-10-10 ENCOUNTER — Encounter: Payer: Self-pay | Admitting: Physical Medicine & Rehabilitation

## 2016-10-10 VITALS — BP 138/84 | HR 81 | Resp 14

## 2016-10-10 DIAGNOSIS — I69359 Hemiplegia and hemiparesis following cerebral infarction affecting unspecified side: Secondary | ICD-10-CM

## 2016-10-10 DIAGNOSIS — R279 Unspecified lack of coordination: Secondary | ICD-10-CM | POA: Insufficient documentation

## 2016-10-10 DIAGNOSIS — I69398 Other sequelae of cerebral infarction: Secondary | ICD-10-CM

## 2016-10-10 DIAGNOSIS — I69354 Hemiplegia and hemiparesis following cerebral infarction affecting left non-dominant side: Secondary | ICD-10-CM | POA: Insufficient documentation

## 2016-10-10 DIAGNOSIS — R531 Weakness: Secondary | ICD-10-CM | POA: Insufficient documentation

## 2016-10-10 DIAGNOSIS — I10 Essential (primary) hypertension: Secondary | ICD-10-CM | POA: Diagnosis not present

## 2016-10-10 DIAGNOSIS — M21372 Foot drop, left foot: Secondary | ICD-10-CM | POA: Diagnosis not present

## 2016-10-10 DIAGNOSIS — R269 Unspecified abnormalities of gait and mobility: Secondary | ICD-10-CM

## 2016-10-10 NOTE — Patient Instructions (Addendum)
Cont with antiedema techniques

## 2016-10-10 NOTE — Progress Notes (Signed)
Subjective:    Patient ID: Jason Oconnor, male    DOB: 1956/02/18, 60 y.o.   MRN: [id number]  60 year old right-handed male with history of hypertension, married independent prior to admission. Presented to [facility] on July 14, 2016, with left-sided weakness and facial droop.  Blood pressure elevated 170/106. Cranial CT scan negative.  MRI of the brain showed acute infarct posterior basal ganglia on the right involving the posterior putamen. CTA of the head showed no emergent large vessel occlusion or stenosis. Carotid Dopplers with no ICA stenosis.  The patient did not receive tPA. Echocardiogram with ejection fraction of 123456, normal systolic function. Initially, placed on aspirin and Plavix for CVA prophylaxis, simplified to Plavix.  Subcutaneous Lovenox for DVT prophylaxis.  Physical and occupational therapy ongoing.  The patient was admitted for comprehensive rehab program.  HPI Episodes of pain when Left shoulder , left dorsum of wrist, extensor spasms in LLE and LUE last ~10s, occurs every 1-2 hours at noc. Occ happens with prolonged sitting during the day  Has driven without issues  Pain Inventory Average Pain 3 Pain Right Now 0 My pain is intermittent, sharp and stabbing  In the last 24 hours, has pain interfered with the following? General activity 1 Relation with others 2 Enjoyment of life 5 What TIME of day is your pain at its worst? night Sleep (in general) Fair  Pain is worse with: bending and some activites Pain improves with: rest Relief from Meds: 0  Mobility walk without assistance walk with assistance use a cane how many minutes can you walk? 20 ability to climb steps?  yes do you drive?  yes Do you have any goals in this area?  yes  Function employed # of hrs/week . 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  yes  Neuro/Psych weakness spasms  Prior Studies Any  changes since last visit?  no  Physicians involved in your care Any changes since last visit?  no   History reviewed. No pertinent family history. Social History   Social History  . Marital status: Married    Spouse name: N/A  . Number of children: N/A  . Years of education: N/A   Social History Main Topics  . Smoking status: Former Research scientist (life sciences)  . Smokeless tobacco: Never Used  . Alcohol use Yes     Comment: rare  . Drug use: No  . Sexual activity: Not Asked   Other Topics Concern  . None   Social History Narrative  . None   History reviewed. No pertinent surgical history. Past Medical History:  Diagnosis Date  . Bone marrow disease   . Hypertension    There were no vitals taken for this visit.  Opioid Risk Score:   Fall Risk Score:  `1  Depression screen PHQ 2/9  Depression screen PHQ 2/9 08/13/2016  Decreased Interest 0  Down, Depressed, Hopeless 0  PHQ - 2 Score 0  Altered sleeping 0  Tired, decreased energy 1  Change in appetite 0  Feeling bad or failure about yourself  0  Trouble concentrating 0  Moving slowly or fidgety/restless 1  Suicidal thoughts 0  PHQ-9 Score 2  Difficult doing work/chores Not difficult at all    Review of Systems  HENT: Negative.   Eyes: Negative.   Respiratory: Negative.   Cardiovascular: Negative.   Gastrointestinal: Negative.   Endocrine: Negative.   Genitourinary: Negative.   Musculoskeletal: Positive for  gait problem.       Spasms  Skin: Negative.   Allergic/Immunologic: Negative.   Neurological: Positive for weakness.  Hematological: Negative.   Psychiatric/Behavioral: Negative.   All other systems reviewed and are negative.      Objective:   Physical Exam  Constitutional: He is oriented to person, place, and time. He appears well-developed and well-nourished.  HENT:  Head: Normocephalic and atraumatic.  Eyes: Conjunctivae and EOM are normal. Pupils are equal, round, and reactive to light.  Neurological: He  is alert and oriented to person, place, and time.  Psychiatric: He has a normal mood and affect.  Nursing note and vitals reviewed.  1 plus edema dorsum of hand and wrist. Limitation in wrist and finger flexion, 1 half range Her strength is 3 minus at the deltoid, biceps, triceps 2 minus at the finger flexors and extensors. 3 minus, left hip flexor 4, left knee extensor trace, ankle dorsiflexor Station intact in left upper and left lower limb, normal on the right side      Assessment & Plan:  1. Right basal ganglia infarct causing left hemiparesis. Pure motor stroke, residual left upper extremity and left lower extremity weakness distal greater than proximal. Left shoulder pain awakens pt.  Then the patient notices some shaking in the left arm and left leg when he moves We discussed anti-spasticity medications. However, he is trying to minimize his medication intake. He mainly wished for reassurance that this is not a serious conditioning. We discussed that this is part of his stroke.  2. Left dorsal wrist swelling. This is also related to weakness as well as dependent edema. Continue antiedema measures such as elevation and retrograde massage as well as Isotoner glove. I do not see any role for diuretics. This is very localized and mild to moderate  Over half of the 25 min visit was spent counseling and coordinating care. We discussed his stroke deficits. Answered questions from both patient and his wife

## 2016-10-11 ENCOUNTER — Ambulatory Visit: Payer: 59

## 2016-10-11 ENCOUNTER — Ambulatory Visit: Payer: 59 | Admitting: Occupational Therapy

## 2016-10-11 DIAGNOSIS — M6281 Muscle weakness (generalized): Secondary | ICD-10-CM | POA: Diagnosis not present

## 2016-10-11 DIAGNOSIS — R278 Other lack of coordination: Secondary | ICD-10-CM

## 2016-10-11 DIAGNOSIS — R2681 Unsteadiness on feet: Secondary | ICD-10-CM

## 2016-10-11 NOTE — Therapy (Signed)
[place] MAIN [facility] SERVICES [address] [place], [place], [address] Phone: [phone]   Fax:  [phone]  Physical Therapy Treatment  Patient Details  Name: Jason Oconnor MRN: [id number] Date of Birth: 1956-04-11 Referring Provider: Dr. Naaman Plummer  Encounter Date: 10/11/2016      PT End of Session - 10/11/16 0820    Visit Number 17   Number of Visits 25   Date for PT Re-Evaluation 10/30/16   Authorization Type no g codes   PT Start Time 0800   PT Stop Time 0845   PT Time Calculation (min) 45 min   Equipment Utilized During Treatment Gait belt   Activity Tolerance Patient tolerated treatment well   Behavior During Therapy [facility] for tasks assessed/performed      Past Medical History:  Diagnosis Date  . Bone marrow disease   . Hypertension     History reviewed. No pertinent surgical history.  There were no vitals filed for this visit.      Subjective Assessment - 10/11/16 0805    Subjective Patient reports he went to his physician appointment and reports he received "the okay" to start driving on the highway with his wife's help.    Pertinent History Patient is a 60 year old right-handed male with history of hypertension. Per chart review patient is married. 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 Presented to [facility] 07/14/2016 with left-sided weakness and facial droop. Blood pressure elevated 170/106. Head CT scan negative. 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 CTA of the head showed no emergent large vessel occlusion or severe stenosis. Carotid Dopplers with no ICA stenosis. Patient did not receive TPA. Echocardiogram with ejection fraction of 60%. Normal systolic function. Placed on aspirin and Plavix for CVA prophylaxis. Subcutaneous Lovenox for  DVT prophylaxis.  Patient transferred to CIR on 07/16/2016. Pt discharged form CIR on 08/03/16. Pt reports that he made progress at CIR. No reported problems since returning home. Currently using a wide base quad cane in RUE and L AFO. Pt reports that he has made the last progress with respect to his LUE strength.    Patient Stated Goals "Get back as close as I can to normal." Pt would to be able to play golf. Pt would like to return to some kind of work.       TREATMENT:  Modalities  NMES pulse width 380uS, Intensity 44, Rate: 50Hz , waveform: asymmetrical 8 minutes total time. 15 sec on/ 30 sec off against a 10 # weight with patient in sitting to improve motor recruitment.  Therapeutic Exercise Tandem ambulation over 2 half foams down and back - x5 Tandem stance on blue half foam -- 3 x 30 sec B Heel raises off of blue half foam - x 15  Tandem on airex ball toss - x25 B with intermittent UE support throughout exercise  Stool scoots with LLE only - x118ft Stepping lunges from airex onto dynadisc - x 15 Step taps from airex onto Bosu balls bilaterally - x 15       PT Education - 10/11/16 0819    Education provided Yes   Education Details Educated on POC and expectations with LLE with exercise and stroke   Person(s) Educated Patient   Methods Explanation   Comprehension Verbalized understanding  PT Long Term Goals - 10/02/16 [id number]      PT LONG TERM GOAL #1   Title Pt will be independent with HEP in order to improve strength and balance in order to decrease fall risk and improve function at home and work.   Baseline 10/02/16: Mod A for exericise progression/ performance   Time 4   Period Weeks   Status On-going     PT LONG TERM GOAL #2   Title Pt will improve BERG by at least 3 points in order to demonstrate clinically significant improvement in balance.   Baseline 08/07/16: 49/56 10/02/16: 53/56   Time 8   Period Weeks   Status Achieved     PT LONG TERM GOAL #3    Title Pt will decrease TUG to below 14 seconds in order to demonstrate decreased fall risk    Baseline 08/07/16: 21.9 seconds; 10/02/16: 12.3    Time 8   Period Weeks   Status Achieved     PT LONG TERM GOAL #4   Title Pt will decrease 5TSTS by at least 3 seconds in order to demonstrate clinically significant improvement in LE strength   Baseline 08/07/16: 18.5 seconds; 10/02/16: 14.3   Time 8   Period Weeks   Status Achieved     PT LONG TERM GOAL #5   Title Pt will increase 10MWT by at least 0.13 m/s in order to demonstrate clinically significant improvement in community ambulation   Baseline 08/07/16: 0.53 m/s 10/02/16: .97 m/s   Time 8   Period Weeks   Status Achieved     Additional Long Term Goals   Additional Long Term Goals Yes     PT LONG TERM GOAL #6   Title Pt will be able to swing a golf club safely in order to complete 9 holes of golf   Baseline 10/02/16: Unable to swing golf club safely   Time 4   Period Weeks   Status On-going     PT LONG TERM GOAL #7   Title Pt will improve 6MWT by at least 181ft  to demonstrate improved aerobic capacity/endurance   Baseline 582 ft 10/02/16: 1150ft   Time 7   Period Weeks   Status Achieved     PT LONG TERM GOAL #8   Title Patient will improve DGI by 4 points to demonstrate improvement in dynamic gait/balance and decrease fall risk   Baseline 10/02/16: 19/24   Time 4   Period Weeks   Status New               Plan - 10/11/16 0848    Clinical Impression Statement Patient demonstrates improved L quad and LE control with balance activities today indicating functional carryover between visits. Although patient is improving, he continues to demonstrate decreased motor control and coordination of the L LE and patient will benefit from further skilled therapy to return to prior level of function.     Rehab Potential Good   Clinical Impairments Affecting Rehab Potential Positive: motivation, prior functional level; Negative: no  active dorsiflexion, increased LUE tone   PT Frequency 2x / week   PT Duration 4 weeks   PT Treatment/Interventions ADLs/Self Care Home Management;Biofeedback;Canalith Repostioning;Cryotherapy;Electrical Stimulation;Iontophoresis 4mg /ml Dexamethasone;Aquatic Therapy;Moist Heat;Traction;Ultrasound;Gait training;Stair training;Functional mobility training;Therapeutic activities;DME Instruction;Therapeutic exercise;Balance training;Neuromuscular re-education;Cognitive remediation;Patient/family education;Orthotic Fit/Training;Manual techniques;Passive range of motion;Energy conservation;Taping;Vestibular   PT Next Visit Plan Clamshell; DGI, LEFS, NME for L anterior tibialis, Walk-aide, progress balance, strengthening, and HEP; Consider sidelying hip abduction exercises   PT  Home Exercise Plan Sit to stand, seated tband resisted clams, seated adduction squeeze   Consulted and Agree with Plan of Care Patient      Patient will benefit from skilled therapeutic intervention in order to improve the following deficits and impairments:  Abnormal gait, Decreased balance, Decreased coordination, Decreased mobility, Decreased range of motion, Decreased strength, Difficulty walking, Impaired tone, Impaired UE functional use  Visit Diagnosis: Muscle weakness (generalized)  Other lack of coordination  Unsteadiness on feet     Problem List Patient Active Problem List   Diagnosis Date Noted  . 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 Neurocognitive disorder   . Left foot drop   . Benign essential HTN   . Hemiparesis affecting nondominant side as late effect of cerebrovascular accident (CVA) ([place]) 07/18/2016  . Right basal ganglia embolic stroke ([place]) [id number]  . CVA (cerebral infarction) 07/15/2016  . TIA (transient ischemic attack) 07/14/2016  . HTN, goal below 140/80 07/14/2016  . Left facial numbness 07/14/2016  . Weakness of left side of body 07/14/2016    Blythe Stanford, PT  DPT 10/11/2016, 9:00 AM  [place] MAIN [facility] SERVICES [address] [place], [place], [address] Phone: [phone]   Fax:  [phone]  Name: Alxander Cappella MRN: [id number] Date of Birth: 1956-04-22

## 2016-10-12 ENCOUNTER — Encounter: Payer: Self-pay | Admitting: Occupational Therapy

## 2016-10-12 NOTE — Therapy (Signed)
[place] MAIN [facility] SERVICES [address] [place], [place], [address] Phone: [phone]   Fax:  [phone]  Occupational Therapy Treatment  Patient Details  Name: Jason Oconnor MRN: [id number] Date of Birth: 04-02-1956 No Data Recorded  Encounter Date: 10/09/2016      OT End of Session - 10/12/16 2038    Visit Number 17   Number of Visits 24   Date for OT Re-Evaluation 11/01/16   OT Start Time 0845   OT Stop Time 0930   OT Time Calculation (min) 45 min   Activity Tolerance Patient tolerated treatment well   Behavior During Therapy [facility] for tasks assessed/performed      Past Medical History:  Diagnosis Date  . Bone marrow disease   . Hypertension     History reviewed. No pertinent surgical history.  There were no vitals filed for this visit.      Subjective Assessment - 10/12/16 2027    Subjective  Patient reports has pain initially with shoulder movement, 3/10 prior to exercises.     Pertinent History Patient reports on July 13, 2016 he was helping a friend doing home repairs and felt tired, went home and started to notice problems moving around.  The next morning he feel trying to put his slippers on, wife transported him to ER and [facility] for 3 days and then went to [facility] for REhab and was discharged this past Saturday to home.     Patient Stated Goals "I want to get back to close as normal as possible, play golf again, work in the garage."     Pain Score 3    Pain Location Shoulder   Pain Orientation Left   Pain Descriptors / Indicators Aching   Pain Type Acute pain   Pain Onset More than a month ago   Pain Frequency Intermittent   Multiple Pain Sites No                      OT Treatments/Exercises (OP) - 10/12/16 2033      Fine Motor Coordination   Other Fine Motor Exercises Patient seen for manipulation of coins from tabletop, unable to pick up from table, has to slide to the edge to obtain between  thumb/fingers.  Cues for technique.       Neurological Re-education Exercises   Other Exercises 1 Shoulder flexion in supine 128 degrees, ABD to 90.  Patient seen for shoulder stabilization exercises in supine for left shoulder for shoulder flexion to 90 degrees, place and hold for 10 secs each for 10 reps for 1 sets, external perturbations, shoulder protraction for 10 reps for 1 sets, shoulder at 90 degrees of flexion with isolated elbow flexion to opposite shoulder and return with triceps press 2 sets of 10 reps. ROM holding medium ball with bilateral UE holding ball with hands in neutral position for shoulder flexion for 2 sets of 10 reps, chest press for 2 sets 10 reps. Shoulder stabilization exercises with left arm at 90 degrees of flexion in supine with movements of drawing plus sign (down and across left), the reverse (up and across right).Reciprocal arm movements with holding wooden sticks, when left arm raises, right arm lowers and then switch with focus on quality of movement. All exercises performed with cues for quality of movements, control, isolated muscle targeting and guiding as needed from therapist. Rest breaks as needed between sets.   Other Exercises 2 Resistive pinch pins in sitting  for yellow and red resistance with cues for hand position and pinch skill.  L wrist and forearm stretches prior to ROM exercises to increase ROM and decrease pain.                OT Education - 10/12/16 2038    Education provided Yes   Education Details HEP   Person(s) Educated Patient   Methods Explanation;Demonstration;Verbal cues   Comprehension Verbal cues required;Verbalized understanding             OT Long Term Goals - 10/12/16 2042      OT LONG TERM GOAL #1   Title Patient to improve left UE coordination sufficient to tie shoes with modified independence.    Baseline unable   Time 12   Period Weeks   Status On-going     OT LONG TERM GOAL #2   Title Patient to improve  LUE coordination to complete buttons, snaps and zippers on clothing with modified independence.   Time 12   Period Weeks   Status On-going     OT LONG TERM GOAL #3   Title Patient to complete bathing with modified independence including using left arm to wash right side of body.    Time 12   Period Weeks   Status On-going     OT LONG TERM GOAL #4   Title Patient to improve left hand use to type on computer using both hands on home row keys, 2 lines with no errors in 5 minutes.    Baseline unable   Time 12   Period Weeks   Status On-going     OT LONG TERM GOAL #5   Title Patient will improve grip on left hand by 10# to stabilize jars and containers for opening.    Baseline unable   Time 12   Period Weeks   Status On-going     OT LONG TERM GOAL #6   Title Patient will be able to demonstrate sweeping with use of bilateral UEs with modified independence.    Baseline unable   Time 12   Period Weeks   Status On-going               Plan - 10/12/16 2039    Clinical Impression Statement Patient has continued to progress with LUE in both strength at the shoulder and functional use of the hand to manipulate objects.  Patient has had pain in the wrist at times and benefits from wrist stretches to decrease pain and increase motion.  Patient encouraged to perform finger extensions especially following any gripping exercises.     Rehab Potential Good   OT Frequency 2x / week   OT Duration 12 weeks   OT Treatment/Interventions Self-care/ADL training;Therapeutic exercise;Neuromuscular education;Visual/perceptual remediation/compensation;Therapist, nutritional;Therapeutic exercises;Patient/family education;DME and/or AE instruction;Manual Therapy;Therapeutic activities;Balance training   Consulted and Agree with Plan of Care Patient      Patient will benefit from skilled therapeutic intervention in order to improve the following deficits and impairments:  Decreased knowledge of  use of DME, Decreased coordination, Impaired sensation, Decreased activity tolerance, Decreased range of motion, Decreased strength, Decreased balance, Difficulty walking, Impaired UE functional use  Visit Diagnosis: Muscle weakness (generalized)  Other lack of coordination    Problem List Patient Active Problem List   Diagnosis Date Noted  . 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 Neurocognitive disorder   . Left foot drop   . Benign essential HTN   . Hemiparesis affecting nondominant side as late effect  of cerebrovascular accident (CVA) ([place]) 07/18/2016  . Right basal ganglia embolic stroke ([place]) 123XX123  . CVA (cerebral infarction) 07/15/2016  . TIA (transient ischemic attack) 07/14/2016  . HTN, goal below 140/80 07/14/2016  . Left facial numbness 07/14/2016  . Weakness of left side of body 07/14/2016   Tanis Burnley Oneita Jolly, OTR/L, CLT  Avraj Lindroth 10/12/2016, 8:44 PM  [place] MAIN [facility] SERVICES [address] [place], [place], [address] Phone: [phone]   Fax:  [phone]  Name: Jason Oconnor MRN: [id number] Date of Birth: 13-Aug-1956

## 2016-10-13 ENCOUNTER — Encounter: Payer: Self-pay | Admitting: Occupational Therapy

## 2016-10-13 NOTE — Therapy (Signed)
[place] MAIN [facility] SERVICES [address] [place], [place], [address] Phone: [phone]   Fax:  [phone]  Occupational Therapy Treatment  Patient Details  Name: Jason Oconnor MRN: [id number] Date of Birth: 1956/01/29 No Data Recorded  Encounter Date: 10/11/2016      OT End of Session - 10/13/16 2128    Visit Number 18   Number of Visits 24   Date for OT Re-Evaluation 11/01/16   OT Start Time 0845   OT Stop Time 0931   OT Time Calculation (min) 46 min   Activity Tolerance Patient tolerated treatment well   Behavior During Therapy [facility] for tasks assessed/performed      Past Medical History:  Diagnosis Date  . Bone marrow disease   . Hypertension     History reviewed. No pertinent surgical history.  There were no vitals filed for this visit.      Subjective Assessment - 10/13/16 2127    Subjective  Patient reports his doctor appt went well, MD confirmed pain is normal after CVA.   Pertinent History Patient reports on July 13, 2016 he was helping a friend doing home repairs and felt tired, went home and started to notice problems moving around.  The next morning he feel trying to put his slippers on, wife transported him to ER and [facility] for 3 days and then went to [facility] for REhab and was discharged this past Saturday to home.     Patient Stated Goals "I want to get back to close as normal as possible, play golf again, work in the garage."     Currently in Pain? Yes   Pain Score 3    Pain Location Wrist   Pain Orientation Left   Pain Descriptors / Indicators Aching   Pain Type Acute pain   Pain Onset More than a month ago   Pain Frequency Intermittent   Multiple Pain Sites No                      OT Treatments/Exercises (OP) - 10/13/16 2146      ADLs   ADL Comments Patient seen for manipulation of child proof safety locked medication bottles for opening and closing, cues for technique to compensate for  decreased finger strength/movements.  Variety of sized bottles used.      Fine Motor Coordination   Other Fine Motor Exercises Patient seen for manipulation of 1" blocks with oppositional grasp of thumb and index, cues provided for prehension patterns.      Neurological Re-education Exercises   Other Exercises 1 Patient seen for shoulder stabilization exercises in supine for left shoulder for shoulder flexion to 90 degrees, place and hold for 10 secs each for 10 reps for 1 sets, external perturbations, shoulder protraction for 10 reps for 1 sets,  use of yoga block this date to hold in BUE to place hands in neutral position, chest press for 2 sets 10 reps. Shoulder stabilization exercises with left arm at 90 degrees of flexion in supine and performing ABC exercise with 2#  for one complete set with cues.Reciprocal arm movements with holding wooden sticks, when left arm raises, right arm lowers and then switch with focus on quality of movement. All exercises performed with cues for quality of movements, control, isolated muscle targeting and guiding as needed from therapist. Rest breaks as needed between sets.   Other Exercises 2 Dowel ladder in sitting with 4# for rung to rung, top  to bottom and 1/2 way for multiple sets.  Yellow putty for light grip, pinch lateral, 2 point and 3 point with cues.                 OT Education - 10/13/16 2127    Education provided Yes   Education Details positioning, wrist stretches, edema control   Person(s) Educated Patient   Methods Explanation;Demonstration;Verbal cues   Comprehension Verbal cues required;Returned demonstration;Verbalized understanding             OT Long Term Goals - 10/12/16 2042      OT LONG TERM GOAL #1   Title Patient to improve left UE coordination sufficient to tie shoes with modified independence.    Baseline unable   Time 12   Period Weeks   Status On-going     OT LONG TERM GOAL #2   Title Patient to improve LUE  coordination to complete buttons, snaps and zippers on clothing with modified independence.   Time 12   Period Weeks   Status On-going     OT LONG TERM GOAL #3   Title Patient to complete bathing with modified independence including using left arm to wash right side of body.    Time 12   Period Weeks   Status On-going     OT LONG TERM GOAL #4   Title Patient to improve left hand use to type on computer using both hands on home row keys, 2 lines with no errors in 5 minutes.    Baseline unable   Time 12   Period Weeks   Status On-going     OT LONG TERM GOAL #5   Title Patient will improve grip on left hand by 10# to stabilize jars and containers for opening.    Baseline unable   Time 12   Period Weeks   Status On-going     OT LONG TERM GOAL #6   Title Patient will be able to demonstrate sweeping with use of bilateral UEs with modified independence.    Baseline unable   Time 12   Period Weeks   Status On-going               Plan - 10/13/16 2129    Clinical Impression Statement Patient has continued to progress in all areas, grip strength decreased but is showing increased finger manipulation and coordination of objects.  Will continue to address wrist pain, with edema control measures, wrist stretches, attempts at weight bearing and functional use of LUE for daily tasks.    Rehab Potential Good   OT Frequency 2x / week   OT Duration 12 weeks   OT Treatment/Interventions Self-care/ADL training;Therapeutic exercise;Neuromuscular education;Visual/perceptual remediation/compensation;Therapist, nutritional;Therapeutic exercises;Patient/family education;DME and/or AE instruction;Manual Therapy;Therapeutic activities;Balance training   Consulted and Agree with Plan of Care Patient      Patient will benefit from skilled therapeutic intervention in order to improve the following deficits and impairments:  Decreased knowledge of use of DME, Decreased coordination, Impaired  sensation, Decreased activity tolerance, Decreased range of motion, Decreased strength, Decreased balance, Difficulty walking, Impaired UE functional use  Visit Diagnosis: Muscle weakness (generalized)  Other lack of coordination    Problem List Patient Active Problem List   Diagnosis Date Noted  . 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 Neurocognitive disorder   . Left foot drop   . Benign essential HTN   . Hemiparesis affecting nondominant side as late effect of cerebrovascular accident (CVA) ([place]) 07/18/2016  . Right basal  ganglia embolic stroke ([place]) 123XX123  . CVA (cerebral infarction) 07/15/2016  . TIA (transient ischemic attack) 07/14/2016  . HTN, goal below 140/80 07/14/2016  . Left facial numbness 07/14/2016  . Weakness of left side of body 07/14/2016   [name] Oneita Jolly, OTR/L, CLT  [name],[name] 10/13/2016, 9:57 PM  [place] MAIN [facility] SERVICES [address] [place], [place], [address] Phone: [phone]   Fax:  [phone]  Name: Yeriel Rittel MRN: [id number] Date of Birth: January 09, 1956

## 2016-10-15 ENCOUNTER — Ambulatory Visit: Payer: 59

## 2016-10-15 ENCOUNTER — Ambulatory Visit: Payer: 59 | Admitting: Occupational Therapy

## 2016-10-15 ENCOUNTER — Encounter: Payer: Self-pay | Admitting: Occupational Therapy

## 2016-10-15 DIAGNOSIS — M6281 Muscle weakness (generalized): Secondary | ICD-10-CM | POA: Diagnosis not present

## 2016-10-15 DIAGNOSIS — R278 Other lack of coordination: Secondary | ICD-10-CM

## 2016-10-15 NOTE — Therapy (Signed)
[place] MAIN [facility] SERVICES [address] [place], [place], [address] Phone: [phone]   Fax:  [phone]  Physical Therapy Treatment  Patient Details  Name: Korede Bitler MRN: [id number] Date of Birth: Jan 11, 1956 Referring Provider: Dr. Naaman Plummer  Encounter Date: 10/15/2016      PT End of Session - 10/15/16 0857    Visit Number 18   Number of Visits 25   Date for PT Re-Evaluation 10/30/16   Authorization Type no g codes   PT Start Time 0800   PT Stop Time 0845   PT Time Calculation (min) 45 min   Equipment Utilized During Treatment Gait belt   Activity Tolerance Patient tolerated treatment well   Behavior During Therapy [facility] for tasks assessed/performed      Past Medical History:  Diagnosis Date  . Bone marrow disease   . Hypertension     History reviewed. No pertinent surgical history.  There were no vitals filed for this visit.      Subjective Assessment - 10/15/16 0811    Subjective Patient reports he's been having temors on his left side. States no difficulty driving reporting he was able to drive on the highway.    Pertinent History Patient is a 60 year old right-handed male with history of hypertension. Per chart review patient is married. 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 Presented to [facility] 07/14/2016 with left-sided weakness and facial droop. Blood pressure elevated 170/106. Head CT scan negative. 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 CTA of the head showed no emergent large vessel occlusion or severe stenosis. Carotid Dopplers with no ICA stenosis. Patient did not receive TPA. Echocardiogram with ejection fraction of 60%. Normal systolic function. Placed on aspirin and Plavix for CVA prophylaxis. Subcutaneous Lovenox for DVT  prophylaxis.  Patient transferred to CIR on 07/16/2016. Pt discharged form CIR on 08/03/16. Pt reports that he made progress at CIR. No reported problems since returning home. Currently using a wide base quad cane in RUE and L AFO. Pt reports that he has made the last progress with respect to his LUE strength.    Patient Stated Goals "Get back as close as I can to normal." Pt would to be able to play golf. Pt would like to return to some kind of work.    Currently in Pain? No/denies      TREATMENT:  Modalities  NMES pulse width 380uS, Intensity 44, Rate: 50Hz , waveform: asymmetrical 8 minutes total time. 15 sec on/ 30 sec off against a 10 # weight with patient in sitting to improve motor recruitment. Active ankle Dorsiflexion performed throughout entirety of motion.  Therapeutic Exercise Stepping lunges from airex onto dynadisc - x 15 Side stepping onto bosu from airex bilaterally - x15 Single leg stance on airex ball toss with one foot supported on dynadisc- x25 B with intermittent UE support throughout exercise  Tandem stance on airex pad with rotations - x 15 B Anterior/posterior, lateral weight shifts in standing with BAPS board - x 20 with B UE support  Ball kicks from airex pad - x 20 B Gastroc stretch on the L LE - 2 x 30sec in standing       PT Education - 10/15/16 0856    Education provided Yes   Education Details Educated to maintain HEP and add single leg exercises  Person(s) Educated Patient   Methods Explanation;Demonstration   Comprehension Verbalized understanding;Returned demonstration             PT Long Term Goals - 10/02/16 0814      PT LONG TERM GOAL #1   Title Pt will be independent with HEP in order to improve strength and balance in order to decrease fall risk and improve function at home and work.   Baseline 10/02/16: Mod A for exericise progression/ performance   Time 4   Period Weeks   Status On-going     PT LONG TERM GOAL #2   Title Pt will improve  BERG by at least 3 points in order to demonstrate clinically significant improvement in balance.   Baseline 08/07/16: 49/56 10/02/16: 53/56   Time 8   Period Weeks   Status Achieved     PT LONG TERM GOAL #3   Title Pt will decrease TUG to below 14 seconds in order to demonstrate decreased fall risk    Baseline 08/07/16: 21.9 seconds; 10/02/16: 12.3    Time 8   Period Weeks   Status Achieved     PT LONG TERM GOAL #4   Title Pt will decrease 5TSTS by at least 3 seconds in order to demonstrate clinically significant improvement in LE strength   Baseline 08/07/16: 18.5 seconds; 10/02/16: 14.3   Time 8   Period Weeks   Status Achieved     PT LONG TERM GOAL #5   Title Pt will increase 10MWT by at least 0.13 m/s in order to demonstrate clinically significant improvement in community ambulation   Baseline 08/07/16: 0.53 m/s 10/02/16: .97 m/s   Time 8   Period Weeks   Status Achieved     Additional Long Term Goals   Additional Long Term Goals Yes     PT LONG TERM GOAL #6   Title Pt will be able to swing a golf club safely in order to complete 9 holes of golf   Baseline 10/02/16: Unable to swing golf club safely   Time 4   Period Weeks   Status On-going     PT LONG TERM GOAL #7   Title Pt will improve 6MWT by at least 145ft  to demonstrate improved aerobic capacity/endurance   Baseline 582 ft 10/02/16: 1158ft   Time 7   Period Weeks   Status Achieved     PT LONG TERM GOAL #8   Title Patient will improve DGI by 4 points to demonstrate improvement in dynamic gait/balance and decrease fall risk   Baseline 10/02/16: 19/24   Time 4   Period Weeks   Status New               Plan - 10/15/16 0857    Clinical Impression Statement Pt demosntrated improved single leg stance and L LE control compared to previous visits indicating functional carryover between visits. Although patient is improving, he continues to demonstrate decreased motor control, strength and muscular endurance of the L  LE and will benefit from further skilled therapy to return to prior level of function.    Rehab Potential Good   Clinical Impairments Affecting Rehab Potential Positive: motivation, prior functional level; Negative: no active dorsiflexion, increased LUE tone   PT Frequency 2x / week   PT Duration 4 weeks   PT Treatment/Interventions ADLs/Self Care Home Management;Biofeedback;Canalith Repostioning;Cryotherapy;Electrical Stimulation;Iontophoresis 4mg /ml Dexamethasone;Aquatic Therapy;Moist Heat;Traction;Ultrasound;Gait training;Stair training;Functional mobility training;Therapeutic activities;DME Instruction;Therapeutic exercise;Balance training;Neuromuscular re-education;Cognitive remediation;Patient/family education;Orthotic Fit/Training;Manual techniques;Passive range of motion;Energy conservation;Taping;Vestibular  PT Next Visit Plan Clamshell; DGI, LEFS, NME for L anterior tibialis, Walk-aide, progress balance, strengthening, and HEP; Consider sidelying hip abduction exercises   PT Home Exercise Plan Sit to stand, seated tband resisted clams, seated adduction squeeze   Consulted and Agree with Plan of Care Patient      Patient will benefit from skilled therapeutic intervention in order to improve the following deficits and impairments:  Abnormal gait, Decreased balance, Decreased coordination, Decreased mobility, Decreased range of motion, Decreased strength, Difficulty walking, Impaired tone, Impaired UE functional use  Visit Diagnosis: Muscle weakness (generalized)  Other lack of coordination     Problem List Patient Active Problem List   Diagnosis Date Noted  . 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 Neurocognitive disorder   . Left foot drop   . Benign essential HTN   . Hemiparesis affecting nondominant side as late effect of cerebrovascular accident (CVA) ([place]) 07/18/2016  . Right basal ganglia embolic stroke ([place]) 123XX123  . CVA (cerebral infarction) 07/15/2016  . TIA  (transient ischemic attack) 07/14/2016  . HTN, goal below 140/80 07/14/2016  . Left facial numbness 07/14/2016  . Weakness of left side of body 07/14/2016    Blythe Stanford, PT DPT 10/15/2016, 9:00 AM  [place] MAIN [facility] SERVICES [address] [place], [place], [address] Phone: [phone]   Fax:  [phone]  Name: Hedrick Oldenburg MRN: [id number] Date of Birth: 05-19-1956

## 2016-10-16 NOTE — Therapy (Signed)
[place] MAIN [facility] SERVICES [address] [place], [place], [address] Phone: [phone]   Fax:  [phone]  Occupational Therapy Treatment  Patient Details  Name: Jason Oconnor MRN: [id number] Date of Birth: 06/27/1956 No Data Recorded  Encounter Date: 10/15/2016      OT End of Session - 10/16/16 1958    Visit Number 19   Number of Visits 24   Date for OT Re-Evaluation 11/01/16   OT Start Time 0845   OT Stop Time 0930   OT Time Calculation (min) 45 min   Activity Tolerance Patient tolerated treatment well   Behavior During Therapy [facility] for tasks assessed/performed      Past Medical History:  Diagnosis Date  . Bone marrow disease   . Hypertension     History reviewed. No pertinent surgical history.  There were no vitals filed for this visit.      Subjective Assessment - 10/16/16 1950    Subjective  Patient reports he is going to try to help as much as possible with the Thanksgiving meal preparation. His son will be coming to help as well.  Patient usually is the one to put the [place] in and out of the oven however will not be able to do that this year.     Pertinent History Patient reports on July 13, 2016 he was helping a friend doing home repairs and felt tired, went home and started to notice problems moving around.  The next morning he feel trying to put his slippers on, wife transported him to ER and [facility] for 3 days and then went to [facility] for REhab and was discharged this past Saturday to home.     Patient Stated Goals "I want to get back to close as normal as possible, play golf again, work in the garage."     Currently in Pain? Yes   Pain Score 4    Pain Location Shoulder   Pain Orientation Left   Pain Descriptors / Indicators Jabbing   Pain Type Acute pain   Pain Onset More than a month ago                      OT Treatments/Exercises (OP) - 10/16/16 1951      Fine Motor Coordination   Other  Fine Motor Exercises Patient seen for manipulation of objects 1" long, turning and flipping with cues for isolated finger movements, left hand use.  Coin manipulation with attempts to pick up from tabletop, able to perform for most coins but occasionally has to pull coin to the edge of the table to pick it up.       Neurological Re-education Exercises   Other Exercises 1 Patient seen for shoulder stabilization exercises in supine for left shoulder for shoulder flexion to 120 degrees, shoulder ABD to 90, scaption with guiding, shoulder protraction for 10 reps for 1 sets, use of medium ball this date to hold in BUE to place hands in neutral position, chest press for 2 sets 10 reps, opposite shoulder touch with elbow extension. Shoulder stabilization exercises with left arm at 90 degrees of flexion in supine and performing ABC exercise with 2# for one complete set with cues.  All exercises performed with cues for quality of movements, control, isolated muscle targeting and guiding as needed from therapist. Rest breaks as needed between sets.   Other Exercises 2 Sitting ball exercises for shoulder flexion, chest press, encouraged patient to get  ball to use at home for exercises.      Manual Therapy   Manual therapy comments LUE shoulder joint mobilization glenohumeral for inferior and posterior mobs, Grade III prior to ROM to decrease pain and increase ROM.  Performed in sidelying followed by AAROM with cues and guiding at scapula.                OT Education - 10/16/16 1957    Education provided Yes   Education Details exercises with ball in supine and sitting   Person(s) Educated Patient   Methods Explanation;Demonstration;Verbal cues   Comprehension Verbal cues required;Returned demonstration;Verbalized understanding             OT Long Term Goals - 10/12/16 2042      OT LONG TERM GOAL #1   Title Patient to improve left UE coordination sufficient to tie shoes with modified  independence.    Baseline unable   Time 12   Period Weeks   Status On-going     OT LONG TERM GOAL #2   Title Patient to improve LUE coordination to complete buttons, snaps and zippers on clothing with modified independence.   Time 12   Period Weeks   Status On-going     OT LONG TERM GOAL #3   Title Patient to complete bathing with modified independence including using left arm to wash right side of body.    Time 12   Period Weeks   Status On-going     OT LONG TERM GOAL #4   Title Patient to improve left hand use to type on computer using both hands on home row keys, 2 lines with no errors in 5 minutes.    Baseline unable   Time 12   Period Weeks   Status On-going     OT LONG TERM GOAL #5   Title Patient will improve grip on left hand by 10# to stabilize jars and containers for opening.    Baseline unable   Time 12   Period Weeks   Status On-going     OT LONG TERM GOAL #6   Title Patient will be able to demonstrate sweeping with use of bilateral UEs with modified independence.    Baseline unable   Time 12   Period Weeks   Status On-going               Plan - 10/16/16 1958    Clinical Impression Statement Patient's quality of movements have improved, continues to require cues to decrease substitutions at the shoulder.  Improved ROM in supine and progressing to more exercises in sitting.  No wrist pain this date with exercises. Continue towards goals to improve UE use for daily tasks.    Rehab Potential Good   OT Frequency 2x / week   OT Duration 12 weeks   OT Treatment/Interventions Self-care/ADL training;Therapeutic exercise;Neuromuscular education;Visual/perceptual remediation/compensation;Therapist, nutritional;Therapeutic exercises;Patient/family education;DME and/or AE instruction;Manual Therapy;Therapeutic activities;Balance training   Consulted and Agree with Plan of Care Patient      Patient will benefit from skilled therapeutic intervention in  order to improve the following deficits and impairments:  Decreased knowledge of use of DME, Decreased coordination, Impaired sensation, Decreased activity tolerance, Decreased range of motion, Decreased strength, Decreased balance, Difficulty walking, Impaired UE functional use  Visit Diagnosis: Muscle weakness (generalized)  Other lack of coordination    Problem List Patient Active Problem List   Diagnosis Date Noted  . 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 Neurocognitive disorder   .  Left foot drop   . Benign essential HTN   . Hemiparesis affecting nondominant side as late effect of cerebrovascular accident (CVA) ([place]) 07/18/2016  . Right basal ganglia embolic stroke ([place]) [id number]  . CVA (cerebral infarction) 07/15/2016  . TIA (transient ischemic attack) 07/14/2016  . HTN, goal below 140/80 07/14/2016  . Left facial numbness 07/14/2016  . Weakness of left side of body 07/14/2016   Allex Lapoint Oneita Jolly, OTR/L, CLT  Yohann Curl 10/16/2016, 8:01 PM  [name] MAIN [facility] SERVICES [address] [place], [place], [address] Phone: [phone]   Fax:  [phone]  Name: Gleen Nortz MRN: [id number] Date of Birth: June 10, 1956

## 2016-10-21 ENCOUNTER — Telehealth: Payer: Self-pay | Admitting: Physical Medicine & Rehabilitation

## 2016-10-21 NOTE — Telephone Encounter (Signed)
Patient received a phone call from his insurance company stating they had not heard anything back from our office since the 17th of November.  He is supposed to report back tomorrow or have paperwork stating that he can't return to work yet.  The person handling his short term disability is Nicolasa Ducking [phone].  Wife would also like a call back at [phone].  This message was left on clinic line at 4:00.

## 2016-10-21 NOTE — Telephone Encounter (Signed)
Call in tizanidine 2 mg daily at bedtime, #30 one refill

## 2016-10-21 NOTE — Telephone Encounter (Signed)
°  Spk w Dr. Raliegh Ip he has seen no paperwork and this patient has not been released.  Checked all areas of office and also researched notes.  This chart has not been viewed and paperwork is not present. Attempted to call Caryl Pina no answer phoned home and advised wife.

## 2016-10-21 NOTE — Telephone Encounter (Signed)
Dr. Letta Pate had discussed with patient at last appointment about getting some medication for his night spasms.  He would like to get this now.  Please call patient.

## 2016-10-22 MED ORDER — TIZANIDINE HCL 2 MG PO TABS: 2.0000 mg | ORAL_TABLET | Freq: Every day | ORAL | 1 refills | Status: DC

## 2016-10-22 NOTE — Telephone Encounter (Signed)
Medication ordered, patient notified 

## 2016-10-23 ENCOUNTER — Ambulatory Visit: Payer: 59

## 2016-10-23 ENCOUNTER — Ambulatory Visit: Payer: 59 | Admitting: Occupational Therapy

## 2016-10-23 DIAGNOSIS — R2681 Unsteadiness on feet: Secondary | ICD-10-CM

## 2016-10-23 DIAGNOSIS — M6281 Muscle weakness (generalized): Secondary | ICD-10-CM | POA: Diagnosis not present

## 2016-10-23 DIAGNOSIS — R278 Other lack of coordination: Secondary | ICD-10-CM

## 2016-10-23 NOTE — Therapy (Signed)
[place] MAIN [facility] SERVICES [address] [place], [place], [address] Phone: [phone]   Fax:  [phone]  Physical Therapy Treatment  Patient Details  Name: Jason Oconnor MRN: [id number] Date of Birth: 1956/01/15 Referring Provider: Dr. Naaman Plummer  Encounter Date: 10/23/2016      PT End of Session - 10/23/16 0829    Visit Number 19   Number of Visits 25   Date for PT Re-Evaluation 10/30/16   Authorization Type no g codes   PT Start Time 0800   PT Stop Time 0845   PT Time Calculation (min) 45 min   Equipment Utilized During Treatment Gait belt   Activity Tolerance Patient tolerated treatment well   Behavior During Therapy [facility] for tasks assessed/performed      Past Medical History:  Diagnosis Date  . Bone marrow disease   . Hypertension     History reviewed. No pertinent surgical history.  There were no vitals filed for this visit.      Subjective Assessment - 10/23/16 0809    Subjective Patient reports no major changes in leg and states he got his short term leave extended to January 22nd.    Pertinent History Patient is a 60 year old right-handed male with history of hypertension. Per chart review patient is married. 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 Presented to [facility] 07[facility]14/2016 with left-sided weakness and facial droop. Blood pressure elevated 170/106. Head CT scan negative. 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 CTA of the head showed no emergent large vessel occlusion or severe stenosis. Carotid Dopplers with no ICA stenosis. Patient did not receive TPA. Echocardiogram with ejection fraction of 60%. Normal systolic function. Placed on aspirin and Plavix for CVA prophylaxis. Subcutaneous Lovenox for DVT prophylaxis.  Patient transferred  to [facility] on 07/16/2016. Pt discharged form [facility] on 08/03/16. Pt reports that he made progress at CIR. No reported problems since returning home. Currently using a wide base quad cane in RUE and L AFO. Pt reports that he has made the last progress with respect to his LUE strength.    Patient Stated Goals "Get back as close as I can to normal." Pt would to be able to play golf. Pt would like to return to some kind of work.       TREATMENT:  Modalities  NMES pulse width 380uS, Intensity 44, Rate: 50Hz , waveform: asymmetrical 8 minutes total time. 15 sec on/ 30 sec off against a 10 # weight with patient in sitting to improve motor recruitment. Active ankle Dorsiflexion performed throughout entirety of motion.   Therapeutic Exercise Stepping lunges  onto dynadisc - x 21 on L, x14 on R Straight step ups onto bosu ball - with L x20 Side stepping onto bosu from airex bilaterally - x15 Cone taps from airex - x20 Single leg stance on airex ball toss with one foot supported on dynadisc- x25 B with intermittent UE support throughout exercise  Heel raises with unilateral eccentric lowering on L LE  Treadmill forward amb with 3% grade - 82mph 35min with cueing of heel strike Calf raises on quantum leg press machine - unilateral on L LE 90# 2 x 15      PT Education - 10/23/16 [id number]    Education provided Yes   Education Details Eductaed on HEP and form/technique   Person(s) Educated  Patient   Methods Explanation;Demonstration   Comprehension Verbalized understanding;Returned demonstration             PT Long Term Goals - 10/02/16 [id number]      PT LONG TERM GOAL #1   Title Pt will be independent with HEP in order to improve strength and balance in order to decrease fall risk and improve function at home and work.   Baseline 10/02/16: Mod A for exericise progression/ performance   Time 4   Period Weeks   Status On-going     PT LONG TERM GOAL #2   Title Pt will improve BERG by at least 3 points in order  to demonstrate clinically significant improvement in balance.   Baseline 08/07/16: 49/56 10/02/16: 53/56   Time 8   Period Weeks   Status Achieved     PT LONG TERM GOAL #3   Title Pt will decrease TUG to below 14 seconds in order to demonstrate decreased fall risk    Baseline 08/07/16: 21.9 seconds; 10/02/16: 12.3    Time 8   Period Weeks   Status Achieved     PT LONG TERM GOAL #4   Title Pt will decrease 5TSTS by at least 3 seconds in order to demonstrate clinically significant improvement in LE strength   Baseline 08/07/16: 18.5 seconds; 10/02/16: 14.3   Time 8   Period Weeks   Status Achieved     PT LONG TERM GOAL #5   Title Pt will increase 10MWT by at least 0.13 m/s in order to demonstrate clinically significant improvement in community ambulation   Baseline 08/07/16: 0.53 m/s 10/02/16: .97 m/s   Time 8   Period Weeks   Status Achieved     Additional Long Term Goals   Additional Long Term Goals Yes     PT LONG TERM GOAL #6   Title Pt will be able to swing a golf club safely in order to complete 9 holes of golf   Baseline 10/02/16: Unable to swing golf club safely   Time 4   Period Weeks   Status On-going     PT LONG TERM GOAL #7   Title Pt will improve 6MWT by at least 154ft  to demonstrate improved aerobic capacity/endurance   Baseline 582 ft 10/02/16: 1160ft   Time 7   Period Weeks   Status Achieved     PT LONG TERM GOAL #8   Title Patient will improve DGI by 4 points to demonstrate improvement in dynamic gait/balance and decrease fall risk   Baseline 10/02/16: 19/24   Time 4   Period Weeks   Status New               Plan - 10/23/16 [id number]    Clinical Impression Statement Pt demonstrates improved L LE motor planning demonstrating ability to control movement with decreased errors compared to previous visits. Patient continues to demonstrate decreased ankle strength with plantarflexion/dorsiflexion inhibiting ambulation effiency and patient will benefit from  further skilled therapy focused on improving LE strength/coordination to improve ability to perform ADLs.   Rehab Potential Good   Clinical Impairments Affecting Rehab Potential Positive: motivation, prior functional level; Negative: no active dorsiflexion, increased LUE tone   PT Frequency 2x / week   PT Duration 4 weeks   PT Treatment/Interventions ADLs/Self Care Home Management;Biofeedback;Canalith Repostioning;Cryotherapy;Electrical Stimulation;Iontophoresis 4mg /ml Dexamethasone;Aquatic Therapy;Moist Heat;Traction;Ultrasound;Gait training;Stair training;Functional mobility training;Therapeutic activities;DME Instruction;Therapeutic exercise;Balance training;Neuromuscular re-education;Cognitive remediation;Patient/family education;Orthotic Fit/Training;Manual techniques;Passive range of motion;Energy conservation;Taping;Vestibular   PT Next Visit Plan  Clamshell; DGI, LEFS, NME for L anterior tibialis, Walk-aide, progress balance, strengthening, and HEP; Consider sidelying hip abduction exercises   PT Home Exercise Plan Sit to stand, seated tband resisted clams, seated adduction squeeze   Consulted and Agree with Plan of Care Patient      Patient will benefit from skilled therapeutic intervention in order to improve the following deficits and impairments:  Abnormal gait, Decreased balance, Decreased coordination, Decreased mobility, Decreased range of motion, Decreased strength, Difficulty walking, Impaired tone, Impaired UE functional use  Visit Diagnosis: Muscle weakness (generalized)  Unsteadiness on feet  Other lack of coordination     Problem List Patient Active Problem List   Diagnosis Date Noted  . 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 Neurocognitive disorder   . Left foot drop   . Benign essential HTN   . Hemiparesis affecting nondominant side as late effect of cerebrovascular accident (CVA) ([place]) 07/18/2016  . Right basal ganglia embolic stroke ([place]) [id number]  .  CVA (cerebral infarction) 07/15/2016  . TIA (transient ischemic attack) 07/14/2016  . HTN, goal below 140/80 07/14/2016  . Left facial numbness 07/14/2016  . Weakness of left side of body 07/14/2016    Blythe Stanford, PT DPT 10/23/2016, 10:07 AM  [place] MAIN [facility] SERVICES [address] [place], [place], [address] Phone: [phone]   Fax:  [phone]  Name: Jason Oconnor MRN: [id number] Date of Birth: Aug 15, 1956

## 2016-10-24 ENCOUNTER — Encounter: Payer: Self-pay | Admitting: Occupational Therapy

## 2016-10-24 NOTE — Therapy (Signed)
[place] MAIN [facility] SERVICES [address] [place], [place], [address] Phone: [phone]   Fax:  [phone]  Occupational Therapy Treatment  Patient Details  Name: Dayven Himmelman MRN: [id number] Date of Birth: 1956/10/27 No Data Recorded  Encounter Date: 10/23/2016      OT End of Session - 10/24/16 2342    Visit Number 20   Number of Visits 24   Date for OT Re-Evaluation 11/01/16   OT Start Time 0845   OT Stop Time 0929   OT Time Calculation (min) 44 min   Activity Tolerance Patient tolerated treatment well   Behavior During Therapy [facility] for tasks assessed/performed      Past Medical History:  Diagnosis Date  . Bone marrow disease   . Hypertension     History reviewed. No pertinent surgical history.  There were no vitals filed for this visit.      Subjective Assessment - 10/24/16 2337    Subjective  Patient reports he had a good Thanksgiving, spent time with his family at home this year.  His son came to help with the preparations of the meal.  He reports he would like to work towards being able to use his left arm more to activate the turn signal with his left hand now that he is driving again.   Pertinent History Patient reports on July 13, 2016 he was helping a friend doing home repairs and felt tired, went home and started to notice problems moving around.  The next morning he feel trying to put his slippers on, wife transported him to ER and [facility] for 3 days and then went to [facility] for REhab and was discharged this past Saturday to home.     Patient Stated Goals "I want to get back to close as normal as possible, play golf again, work in the garage."     Currently in Pain? Yes   Pain Score 3    Pain Location Shoulder   Pain Orientation Left   Pain Descriptors / Indicators Aching   Pain Type Acute pain   Pain Onset More than a month ago   Pain Frequency Intermittent                      OT  Treatments/Exercises (OP) - 10/24/16 2338      Fine Motor Coordination   Other Fine Motor Exercises Manipulation of 100 pegs unable to pick up and flip from board but was able to pick up from table, flip object with cues and placed back into board.       Neurological Re-education Exercises   Other Exercises 1 Patient seen for cane exercises in sitting this date for shoulder flexion, ABD, ADD, forwards/backwards circles, chest press for 2 sets of 10 reps each with cues for form and proper technique.  Can exercise with cane in vertical position on table with hand over hand up and down the length of the dowel, one handed supination/pronation with dowel smaller, cues for decreasing substitution of movements.  Yellow and red resistance pinch up and down the length of yardstick for increased reach, cues for technique and left shoulder movements with reach.                  OT Education - 10/24/16 2342    Education provided Yes   Education Details cane exercises   Person(s) Educated Patient   Methods Explanation;Demonstration;Verbal cues   Comprehension Verbal cues required;Returned demonstration;Verbalized  understanding             OT Long Term Goals - 10/12/16 2042      OT LONG TERM GOAL #1   Title Patient to improve left UE coordination sufficient to tie shoes with modified independence.    Baseline unable   Time 12   Period Weeks   Status On-going     OT LONG TERM GOAL #2   Title Patient to improve LUE coordination to complete buttons, snaps and zippers on clothing with modified independence.   Time 12   Period Weeks   Status On-going     OT LONG TERM GOAL #3   Title Patient to complete bathing with modified independence including using left arm to wash right side of body.    Time 12   Period Weeks   Status On-going     OT LONG TERM GOAL #4   Title Patient to improve left hand use to type on computer using both hands on home row keys, 2 lines with no errors in 5  minutes.    Baseline unable   Time 12   Period Weeks   Status On-going     OT LONG TERM GOAL #5   Title Patient will improve grip on left hand by 10# to stabilize jars and containers for opening.    Baseline unable   Time 12   Period Weeks   Status On-going     OT LONG TERM GOAL #6   Title Patient will be able to demonstrate sweeping with use of bilateral UEs with modified independence.    Baseline unable   Time 12   Period Weeks   Status On-going               Plan - 10/24/16 2343    Clinical Impression Statement Patient progressing with exercises in sitting today and was able to demonstrate decreased substitution of movements at the shoulder with cues from therapist.  He would like to work on being able to reach more with left arm to activate turn signal in car.  Continues to progress in all areas.  Written HEP this date for cane exercises.   Rehab Potential Good   OT Frequency 2x / week   OT Duration 12 weeks      Patient will benefit from skilled therapeutic intervention in order to improve the following deficits and impairments:  Decreased knowledge of use of DME, Decreased coordination, Impaired sensation, Decreased activity tolerance, Decreased range of motion, Decreased strength, Decreased balance, Difficulty walking, Impaired UE functional use  Visit Diagnosis: Muscle weakness (generalized)  Other lack of coordination    Problem List Patient Active Problem List   Diagnosis Date Noted  . 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 Neurocognitive disorder   . Left foot drop   . Benign essential HTN   . Hemiparesis affecting nondominant side as late effect of cerebrovascular accident (CVA) ([place]) 07/18/2016  . Right basal ganglia embolic stroke ([place]) [id number]  . CVA (cerebral infarction) 07/15/2016  . TIA (transient ischemic attack) 07/14/2016  . HTN, goal below 140/80 07/14/2016  . Left facial numbness 07/14/2016  . Weakness of left side of body  07/14/2016   Odyssey Vasbinder Oneita Jolly, OTR/L, CLT  Clare Fennimore 10/24/2016, 11:45 PM  [place] MAIN [facility] SERVICES [address] [place], [place], [address] Phone: [phone]   Fax:  [phone]  Name: Yunis Elg MRN: [id number] Date of Birth: 21-Dec-1955

## 2016-10-25 ENCOUNTER — Encounter: Payer: Self-pay | Admitting: Occupational Therapy

## 2016-10-25 ENCOUNTER — Ambulatory Visit: Payer: 59 | Admitting: Occupational Therapy

## 2016-10-25 ENCOUNTER — Ambulatory Visit: Payer: 59 | Attending: Physical Medicine & Rehabilitation

## 2016-10-25 DIAGNOSIS — M6281 Muscle weakness (generalized): Secondary | ICD-10-CM | POA: Diagnosis present

## 2016-10-25 DIAGNOSIS — R278 Other lack of coordination: Secondary | ICD-10-CM

## 2016-10-25 DIAGNOSIS — R2681 Unsteadiness on feet: Secondary | ICD-10-CM | POA: Diagnosis present

## 2016-10-25 NOTE — Therapy (Signed)
[place] MAIN [facility] SERVICES [address] [place], [place], [address] Phone: [phone]   Fax:  [phone]  Physical Therapy Treatment  Patient Details  Name: Jason Oconnor MRN: [id number] Date of Birth: 20-Apr-1956 Referring Provider: Dr. Naaman Plummer  Encounter Date: 10/25/2016      PT End of Session - 10/25/16 0914    Visit Number 20   Number of Visits 25   Date for PT Re-Evaluation 10/30/16   Authorization Type no g codes   PT Start Time 0830   PT Stop Time 0915   PT Time Calculation (min) 45 min   Equipment Utilized During Treatment Gait belt   Activity Tolerance Patient tolerated treatment well   Behavior During Therapy [facility] for tasks assessed/performed      Past Medical History:  Diagnosis Date  . Bone marrow disease   . Hypertension     History reviewed. No pertinent surgical history.  There were no vitals filed for this visit.      Subjective Assessment - 10/25/16 0835    Subjective Patient reports some increased fatigue in his leg after hanging christmas lights in his bushes. Patient states his hand has been cramping at night.    Pertinent History Patient is a 60 year old right-handed male with history of hypertension. Per chart review patient is married. 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 Presented to [facility] 07/14/2016 with left-sided weakness and facial droop. Blood pressure elevated 170/106. Head CT scan negative. 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 CTA of the head showed no emergent large vessel occlusion or severe stenosis. Carotid Dopplers with no ICA stenosis. Patient did not receive TPA. Echocardiogram with ejection fraction of 60%. Normal systolic function. Placed on aspirin and Plavix for CVA prophylaxis. Subcutaneous Lovenox  for DVT prophylaxis.  Patient transferred to [facility] on 07/16/2016. Pt discharged form [facility] on 08/03/16. Pt reports that he made progress at CIR. No reported problems since returning home. Currently using a wide base quad cane in RUE and L AFO. Pt reports that he has made the last progress with respect to his LUE strength.    Patient Stated Goals "Get back as close as I can to normal." Pt would to be able to play golf. Pt would like to return to some kind of work.       TREATMENT:  Modalities  NMES pulse width 380uS, Intensity 44, Rate: 50Hz , waveform: asymmetrical 8 minutes total time. 15 sec on/ 30 sec off against a 10 # weight with patient in sitting to improve motor recruitment. Active ankle Dorsiflexion performed throughout entirety of motion.   Therapeutic Exercise Stepping amb in tandem - 2 x 62ft Sit to stands - 2 x 15 with cueing on hip and knee position throughout movement Backwards amb in tandem in  bars - x6 Stepping lunges onto dynadisc - x 21 on L, x14 on R Stool scoots for hamstring strengthening - x156ft Treadmill forward amb with 3% grade - 1.8 mph 20min with cueing of heel strike Heel raises with unilateral eccentric lowering on L LE - 2 x 20      PT Education - 10/25/16 0913    Education provided Yes   Education Details HEP: Heel raises   Person(s) Educated Patient   Methods Explanation;Demonstration   Comprehension Verbalized understanding;Returned demonstration  PT Long Term Goals - 10/02/16 [id number]      PT LONG TERM GOAL #1   Title Pt will be independent with HEP in order to improve strength and balance in order to decrease fall risk and improve function at home and work.   Baseline 10/02/16: Mod A for exericise progression/ performance   Time 4   Period Weeks   Status On-going     PT LONG TERM GOAL #2   Title Pt will improve BERG by at least 3 points in order to demonstrate clinically significant improvement in balance.   Baseline 08/07/16: 49/56  10/02/16: 53/56   Time 8   Period Weeks   Status Achieved     PT LONG TERM GOAL #3   Title Pt will decrease TUG to below 14 seconds in order to demonstrate decreased fall risk    Baseline 08/07/16: 21.9 seconds; 10/02/16: 12.3    Time 8   Period Weeks   Status Achieved     PT LONG TERM GOAL #4   Title Pt will decrease 5TSTS by at least 3 seconds in order to demonstrate clinically significant improvement in LE strength   Baseline 08/07/16: 18.5 seconds; 10/02/16: 14.3   Time 8   Period Weeks   Status Achieved     PT LONG TERM GOAL #5   Title Pt will increase 10MWT by at least 0.13 m/s in order to demonstrate clinically significant improvement in community ambulation   Baseline 08/07/16: 0.53 m/s 10/02/16: .97 m/s   Time 8   Period Weeks   Status Achieved     Additional Long Term Goals   Additional Long Term Goals Yes     PT LONG TERM GOAL #6   Title Pt will be able to swing a golf club safely in order to complete 9 holes of golf   Baseline 10/02/16: Unable to swing golf club safely   Time 4   Period Weeks   Status On-going     PT LONG TERM GOAL #7   Title Pt will improve 6MWT by at least 176ft  to demonstrate improved aerobic capacity/endurance   Baseline 582 ft 10/02/16: 1127ft   Time 7   Period Weeks   Status Achieved     PT LONG TERM GOAL #8   Title Patient will improve DGI by 4 points to demonstrate improvement in dynamic gait/balance and decrease fall risk   Baseline 10/02/16: 19/24   Time 4   Period Weeks   Status New               Plan - 10/25/16 0915    Clinical Impression Statement Pt demonstrates improved narrow BOS dynamic balance with ability to perform tandem ambulation forward and backwards with little assistance from PT. Although patient is improving, he continues to demonstrate decreased motor control and coordination of the L LE and will benefit from further skilled therapy to return to prior level of function.    Rehab Potential Good   Clinical  Impairments Affecting Rehab Potential Positive: motivation, prior functional level; Negative: no active dorsiflexion, increased LUE tone   PT Frequency 2x / week   PT Duration 4 weeks   PT Treatment/Interventions ADLs/Self Care Home Management;Biofeedback;Canalith Repostioning;Cryotherapy;Electrical Stimulation;Iontophoresis 4mg /ml Dexamethasone;Aquatic Therapy;Moist Heat;Traction;Ultrasound;Gait training;Stair training;Functional mobility training;Therapeutic activities;DME Instruction;Therapeutic exercise;Balance training;Neuromuscular re-education;Cognitive remediation;Patient/family education;Orthotic Fit/Training;Manual techniques;Passive range of motion;Energy conservation;Taping;Vestibular   PT Next Visit Plan Clamshell; DGI, LEFS, NME for L anterior tibialis, Walk-aide, progress balance, strengthening, and HEP; Consider sidelying hip abduction exercises  PT Home Exercise Plan Sit to stand, seated tband resisted clams, seated adduction squeeze   Consulted and Agree with Plan of Care Patient      Patient will benefit from skilled therapeutic intervention in order to improve the following deficits and impairments:  Abnormal gait, Decreased balance, Decreased coordination, Decreased mobility, Decreased range of motion, Decreased strength, Difficulty walking, Impaired tone, Impaired UE functional use  Visit Diagnosis: Muscle weakness (generalized)  Other lack of coordination  Unsteadiness on feet     Problem List Patient Active Problem List   Diagnosis Date Noted  . 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 Neurocognitive disorder   . Left foot drop   . Benign essential HTN   . Hemiparesis affecting nondominant side as late effect of cerebrovascular accident (CVA) ([place]) 07/18/2016  . Right basal ganglia embolic stroke ([place]) 123XX123  . CVA (cerebral infarction) 07/15/2016  . TIA (transient ischemic attack) 07/14/2016  . HTN, goal below 140/80 07/14/2016  . Left facial  numbness 07/14/2016  . Weakness of left side of body 07/14/2016    Blythe Stanford, PT DPT 10/25/2016, 11:20 AM  [place] MAIN [facility] SERVICES [address] [place], [place], [address] Phone: [phone]   Fax:  [phone]  Name: Cisco Mcguane MRN: [id number] Date of Birth: 02-08-1956

## 2016-10-28 NOTE — Therapy (Signed)
[place] MAIN [facility] SERVICES [address] [place], [place], [address] Phone: [phone]   Fax:  [phone]  Occupational Therapy Treatment  Patient Details  Name: Jason Oconnor MRN: [id number] Date of Birth: 1956-03-03 No Data Recorded  Encounter Date: 10/25/2016      OT End of Session - 10/28/16 1521    Visit Number 21   Number of Visits 24   Date for OT Re-Evaluation 11/01/16   OT Start Time 0915   OT Stop Time 1000   OT Time Calculation (min) 45 min   Activity Tolerance Patient tolerated treatment well   Behavior During Therapy [facility] for tasks assessed/performed      Past Medical History:  Diagnosis Date  . Bone marrow disease   . Hypertension     History reviewed. No pertinent surgical history.  There were no vitals filed for this visit.      Subjective Assessment - 10/28/16 1520    Subjective  Patient reports he worked on getting christmas lights on the bushes yesterday and it wore him out.  Reports pain at night in the left arm with spasms and especially painful at the wrist and back of the hand.  Slept well last night because he was so tired.     Pertinent History Patient reports on July 13, 2016 he was helping a friend doing home repairs and felt tired, went home and started to notice problems moving around.  The next morning he feel trying to put his slippers on, wife transported him to ER and [facility] for 3 days and then went to [facility] for REhab and was discharged this past Saturday to home.     Patient Stated Goals "I want to get back to close as normal as possible, play golf again, work in the garage."     Currently in Pain? Yes   Pain Score 3    Pain Location Shoulder   Pain Orientation Left   Pain Descriptors / Indicators Aching;Squeezing   Pain Type Acute pain   Pain Onset More than a month ago   Pain Frequency Intermittent                              OT Education - 10/28/16 1520     Education provided Yes   Education Details continue with edema control measures, HEP with reaching   Person(s) Educated Patient   Methods Explanation;Demonstration;Verbal cues   Comprehension Verbalized understanding;Returned demonstration;Verbal cues required             OT Long Term Goals - 10/12/16 2042      OT LONG TERM GOAL #1   Title Patient to improve left UE coordination sufficient to tie shoes with modified independence.    Baseline unable   Time 12   Period Weeks   Status On-going     OT LONG TERM GOAL #2   Title Patient to improve LUE coordination to complete buttons, snaps and zippers on clothing with modified independence.   Time 12   Period Weeks   Status On-going     OT LONG TERM GOAL #3   Title Patient to complete bathing with modified independence including using left arm to wash right side of body.    Time 12   Period Weeks   Status On-going     OT LONG TERM GOAL #4   Title Patient to improve left hand use to type on  computer using both hands on home row keys, 2 lines with no errors in 5 minutes.    Baseline unable   Time 12   Period Weeks   Status On-going     OT LONG TERM GOAL #5   Title Patient will improve grip on left hand by 10# to stabilize jars and containers for opening.    Baseline unable   Time 12   Period Weeks   Status On-going     OT LONG TERM GOAL #6   Title Patient will be able to demonstrate sweeping with use of bilateral UEs with modified independence.    Baseline unable   Time 12   Period Weeks   Status On-going               Plan - 10/28/16 1521    Clinical Impression Statement Patient has continued to demonstrate increased left shoulder ROM and movements with decreased compensatory movements, performing more exercises now in sitting and standing and requiring decreased amount of cues and able to demo evidence of knowing when he is starting to move incorrectly and able to self correct on many occasions.  Will  continue to work towards goals and will plan to reassess patient's goals, performance and status by the end of next week to determine further needs.    Rehab Potential Good   OT Frequency 2x / week   OT Duration 12 weeks   OT Treatment/Interventions Self-care/ADL training;Therapeutic exercise;Neuromuscular education;Visual/perceptual remediation/compensation;Therapist, nutritional;Therapeutic exercises;Patient/family education;DME and/or AE instruction;Manual Therapy;Therapeutic activities;Balance training   Consulted and Agree with Plan of Care Patient      Patient will benefit from skilled therapeutic intervention in order to improve the following deficits and impairments:  Decreased knowledge of use of DME, Decreased coordination, Impaired sensation, Decreased activity tolerance, Decreased range of motion, Decreased strength, Decreased balance, Difficulty walking, Impaired UE functional use  Visit Diagnosis: Muscle weakness (generalized)  Other lack of coordination    Problem List Patient Active Problem List   Diagnosis Date Noted  . 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 Neurocognitive disorder   . Left foot drop   . Benign essential HTN   . Hemiparesis affecting nondominant side as late effect of cerebrovascular accident (CVA) ([place]) 07/18/2016  . Right basal ganglia embolic stroke ([place]) [id number]  . CVA (cerebral infarction) 07/15/2016  . TIA (transient ischemic attack) 07/14/2016  . HTN, goal below 140/80 07/14/2016  . Left facial numbness 07/14/2016  . Weakness of left side of body 07/14/2016   [name] Oneita Jolly, OTR/L, CLT  [name],[name] 10/28/2016, 3:25 PM  [place] MAIN [facility] SERVICES [address] [place], [place], [address] Phone: [phone]   Fax:  [phone]  Name: Jason Oconnor MRN: [id number] Date of Birth: 1956/09/14

## 2016-10-30 ENCOUNTER — Ambulatory Visit: Payer: 59

## 2016-10-30 ENCOUNTER — Ambulatory Visit: Payer: 59 | Admitting: Occupational Therapy

## 2016-10-30 ENCOUNTER — Encounter: Payer: Self-pay | Admitting: Occupational Therapy

## 2016-10-30 DIAGNOSIS — M6281 Muscle weakness (generalized): Secondary | ICD-10-CM | POA: Diagnosis not present

## 2016-10-30 DIAGNOSIS — R2681 Unsteadiness on feet: Secondary | ICD-10-CM

## 2016-10-30 DIAGNOSIS — R278 Other lack of coordination: Secondary | ICD-10-CM

## 2016-10-30 NOTE — Therapy (Signed)
[place] MAIN [facility] SERVICES [address] [place], [place], [address] Phone: [phone]   Fax:  [phone]  Occupational Therapy Treatment  Patient Details  Name: Jason Oconnor MRN: [id number] Date of Birth: 09-24-1956 No Data Recorded  Encounter Date: 10/30/2016      OT End of Session - 10/30/16 1433    Visit Number 22   Number of Visits 24   Date for OT Re-Evaluation 11/01/16   OT Start Time 0845   OT Stop Time 0931   OT Time Calculation (min) 46 min   Activity Tolerance Patient tolerated treatment well   Behavior During Therapy [facility] for tasks assessed/performed      Past Medical History:  Diagnosis Date  . Bone marrow disease   . Hypertension     History reviewed. No pertinent surgical history.  There were no vitals filed for this visit.      Subjective Assessment - 10/30/16 1424    Pertinent History Patient reports on July 13, 2016 he was helping a friend doing home repairs and felt tired, went home and started to notice problems moving around.  The next morning he feel trying to put his slippers on, wife transported him to ER and [facility] for 3 days and then went to [facility] for REhab and was discharged this past Saturday to home.     Patient Stated Goals "I want to get back to close as normal as possible, play golf again, work in the garage."     Currently in Pain? Yes   Pain Score 3    Pain Location Shoulder   Pain Orientation Left   Pain Descriptors / Indicators Aching;Squeezing   Pain Type Acute pain   Pain Onset More than a month ago   Pain Frequency Intermittent   Multiple Pain Sites Yes   Pain Score 4   Pain Location Wrist   Pain Orientation Left   Pain Descriptors / Indicators Shooting;Squeezing   Pain Radiating Towards with wrist extension patient reports pain on the ulnar side of the wrist   Pain Onset 1 to 4 weeks ago   Pain Frequency Intermittent   Pain Score 5   Pain Location Other (Comment)  Thumb   Pain Orientation Left   Pain Descriptors / Indicators Squeezing;Sharp   Pain Type Acute pain   Pain Onset 1 to 4 weeks ago   Aggravating Factors  pain in the base of the left thumb when attempting opposition of thumb to ring finger                      OT Treatments/Exercises (OP) - 10/30/16 1427      Fine Motor Coordination   Other Fine Motor Exercises Patient seen this date for manipulation of glass beads from tabletop, cues for prehension patterns increased difficulty with ones that are flat side down.  Able to move one bead to the palm of his hand but cannot hold more than 2 without dropping.       Neurological Re-education Exercises   Other Exercises 1 Patient seen in supine for left shoulder ROM, stabilization activities.  Left shoulder flexion to 130 degrees with pain starting at that area and rated as a 3/10.  Place and hold at 90 degrees of flexion and therapist providing external perturbations in all directions for 10 reps/sets.  3# weight for shoulder flexion to 90 degrees and cues for control for decent from flexion, 10 reps X 2 sets, shoulder  protraction 10 X 2, diagonal patterns, elbow flexion/extension, clockwise/counterclockwise with 3# weight and shoulder at 90 degrees of flexion with cues for control and ER without weight.       Manual Therapy   Manual therapy comments LUE shoulder joint mobilization glenohumeral for inferior and posterior mobs, Grade III prior to ROM to decrease pain and increase ROM. Performed in sidelying followed by AAROM with cues and guiding at scapula. Joint mobs for left hand and wrist to decreased pain and increase mobility towards wrist extension and forearm supination.                OT Education - 10/30/16 1433    Education provided Yes   Education Details exercises with 3# weight in supine   Person(s) Educated Patient   Methods Explanation;Demonstration   Comprehension Verbal cues required;Returned  demonstration;Verbalized understanding             OT Long Term Goals - 10/12/16 2042      OT LONG TERM GOAL #1   Title Patient to improve left UE coordination sufficient to tie shoes with modified independence.    Baseline unable   Time 12   Period Weeks   Status On-going     OT LONG TERM GOAL #2   Title Patient to improve LUE coordination to complete buttons, snaps and zippers on clothing with modified independence.   Time 12   Period Weeks   Status On-going     OT LONG TERM GOAL #3   Title Patient to complete bathing with modified independence including using left arm to wash right side of body.    Time 12   Period Weeks   Status On-going     OT LONG TERM GOAL #4   Title Patient to improve left hand use to type on computer using both hands on home row keys, 2 lines with no errors in 5 minutes.    Baseline unable   Time 12   Period Weeks   Status On-going     OT LONG TERM GOAL #5   Title Patient will improve grip on left hand by 10# to stabilize jars and containers for opening.    Baseline unable   Time 12   Period Weeks   Status On-going     OT LONG TERM GOAL #6   Title Patient will be able to demonstrate sweeping with use of bilateral UEs with modified independence.    Baseline unable   Time 12   Period Weeks   Status On-going               Plan - 10/30/16 1434    Clinical Impression Statement Patient reports no pain at rest however has pain in the left shoulder with flexion greater than 130 degrees, reports increased tightness in his left forearm, wrist and hand and notices these areas "drawing up" more which may be increased spasticity in the arm.  Therapist performed manual stretches and mobs to left hand and wrist, forearm and pain decreased from 4 to 2 during session.  Will continue to work towards improving left UE function for daily use at home and potential return to work tasks.   Rehab Potential Good   OT Frequency 2x / week   OT Duration  12 weeks   OT Treatment/Interventions Self-care/ADL training;Therapeutic exercise;Neuromuscular education;Visual/perceptual remediation/compensation;Therapist, nutritional;Therapeutic exercises;Patient/family education;DME and/or AE instruction;Manual Therapy;Therapeutic activities;Balance training   Consulted and Agree with Plan of Care Patient      Patient will benefit  from skilled therapeutic intervention in order to improve the following deficits and impairments:  Decreased knowledge of use of DME, Decreased coordination, Impaired sensation, Decreased activity tolerance, Decreased range of motion, Decreased strength, Decreased balance, Difficulty walking, Impaired UE functional use  Visit Diagnosis: Muscle weakness (generalized)  Other lack of coordination    Problem List Patient Active Problem List   Diagnosis Date Noted  . 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 Neurocognitive disorder   . Left foot drop   . Benign essential HTN   . Hemiparesis affecting nondominant side as late effect of cerebrovascular accident (CVA) ([place]) 07/18/2016  . Right basal ganglia embolic stroke ([place]) [id number]  . CVA (cerebral infarction) 07/15/2016  . TIA (transient ischemic attack) 07/14/2016  . HTN, goal below 140/80 07/14/2016  . Left facial numbness 07/14/2016  . Weakness of left side of body 07/14/2016   [name] Oneita Jolly, OTR/L, CLT  [name],[name] 10/30/2016, 2:39 PM  [place] MAIN [facility] SERVICES [address] [place], [place], [address] Phone: [phone]   Fax:  [phone]  Name: Jason Oconnor MRN: [id number] Date of Birth: 04/19/1956

## 2016-10-30 NOTE — Therapy (Signed)
[place] MAIN [facility] SERVICES [address] [place], [place], [address] Phone: [phone]   Fax:  [phone]  Physical Therapy Treatment  Patient Details  Name: Jason Oconnor MRN: [id number] Date of Birth: May 13, 1956 Referring Provider: Dr. Naaman Plummer  Encounter Date: 10/30/2016      PT End of Session - 10/30/16 0858    Visit Number 21   Number of Visits 37   Date for PT Re-Evaluation 12/11/16   Authorization Type no g codes   PT Start Time 0800   PT Stop Time 0845   PT Time Calculation (min) 45 min   Equipment Utilized During Treatment Gait belt   Activity Tolerance Patient tolerated treatment well   Behavior During Therapy [facility] for tasks assessed/performed      Past Medical History:  Diagnosis Date  . Bone marrow disease   . Hypertension     History reviewed. No pertinent surgical history.  There were no vitals filed for this visit.      Subjective Assessment - 10/30/16 0852    Subjective Patient reports no major changes since the previous visit and states he's been try to walk with less circumduction when ambulating   Pertinent History Patient is a 60 year old right-handed male with history of hypertension. Per chart review patient is married. 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 Presented to [facility] 07/14/2016 with left-sided weakness and facial droop. Blood pressure elevated 170/106. Head CT scan negative. 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 CTA of the head showed no emergent large vessel occlusion or severe stenosis. Carotid Dopplers with no ICA stenosis. Patient did not receive TPA. Echocardiogram with ejection fraction of 60%. Normal systolic function. Placed on aspirin and Plavix for CVA prophylaxis. Subcutaneous Lovenox for DVT  prophylaxis.  Patient transferred to [facility] on 07/16/2016. Pt discharged form [facility] on 08/03/16. Pt reports that he made progress at [facility]. No reported problems since returning home. Currently using a wide base quad cane in RUE and L AFO. Pt reports that he has made the last progress with respect to his LUE strength.    Patient Stated Goals "Get back as close as I can to normal." Pt would to be able to play golf. Pt would like to return to some kind of work.             [facility] PT Assessment - 10/30/16 0001      Dynamic Gait Index   Level Surface Normal   Change in Gait Speed Mild Impairment   Gait with Horizontal Head Turns Normal   Gait with Vertical Head Turns Normal   Gait and Pivot Turn Normal   Step Over Obstacle Mild Impairment   Step Around Obstacles Normal   Steps Mild Impairment   Total Score 21      TREATMENT:  Observation 64mwt: 1.07 m/s   Modalities  NMES pulse width 380uS, Intensity 44, Rate: 50Hz , waveform: asymmetrical 8 minutes total time. 15 sec on/ 30 sec off against a 10 # weight with patient in sitting to improve motor recruitment. Active ankle Dorsiflexion performed throughout entirety of motion.   Therapeutic Exercise Gastroc stretch dynamic - 2 x 20  Heel rasises in standing - x 25 Stepping over blue half foam - on L only x25 with cueing on tibialis anterior activation Stepping amb in tandem - 2 x  47ft Sit to stands - x 15, x20 with cueing on hip and knee position throughout movement Stepping lunges onto dynadisc - x 21 on L, x14 on R High step ups with high knee onto bosu ball - x 15  TKE with single leg - x 25 with double black band        PT Education - 10/30/16 0854    Education provided Yes   Education Details Educated to decrease circumduction when amb   Person(s) Educated Patient   Methods Explanation;Demonstration   Comprehension Verbalized understanding;Returned demonstration             PT Long Term Goals - 10/30/16 0955      PT LONG  TERM GOAL #1   Title Pt will be independent with HEP in order to improve strength and balance in order to decrease fall risk and improve function at home and work.   Baseline 10/30/16: Mod-min A for exericise progression/ performance   Time 6   Period Weeks   Status On-going     PT LONG TERM GOAL #2   Title Pt will improve BERG by at least 3 points in order to demonstrate clinically significant improvement in balance.   Baseline 08/07/16: 49/56 10/02/16: 53/56   Time 8   Period Weeks   Status Achieved     PT LONG TERM GOAL #3   Title Pt will decrease TUG to below 14 seconds in order to demonstrate decreased fall risk    Baseline 08/07/16: 21.9 seconds; 10/02/16: 12.3    Time 8   Period Weeks   Status Achieved     PT LONG TERM GOAL #4   Title Pt will decrease 5TSTS by at least 3 seconds in order to demonstrate clinically significant improvement in LE strength   Baseline 08/07/16: 18.5 seconds; 10/02/16: 14.3   Time 8   Period Weeks   Status Achieved     PT LONG TERM GOAL #5   Title Pt will increase 10MWT by at least 0.13 m/s in order to demonstrate clinically significant improvement in community ambulation   Baseline 08/07/16: 0.53 m/s 10/02/16: .97 m/s   Time 8   Period Weeks   Status Achieved     PT LONG TERM GOAL #6   Title Pt will be able to swing a golf club safely in order to complete 9 holes of golf   Baseline 10/30/16: Unable to swing golf club safely   Time 6   Period Weeks   Status On-going     PT LONG TERM GOAL #7   Title Pt will improve 6MWT by at least 182ft  to demonstrate improved aerobic capacity/endurance   Baseline 582 ft 10/02/16: 1110ft   Time 7   Period Weeks   Status Achieved     PT LONG TERM GOAL #8   Title Patient will improve DGI by 4 points to demonstrate improvement in dynamic gait/balance and decrease fall risk   Baseline 10/02/16: 19/24; 10/30/16: 21/24   Time 6   Period Weeks   Status On-going               Plan - 10/30/16 0942     Clinical Impression Statement Pt demonstrates improved dynamic balance with an improvement in DGI and 73mwt scores. Patient also demonstrates decreased circumduction to progress the leg forward while stepping and demonstrates improved dorsiflexion during gait on the affected side. Although patient is improving, he continues to demonstrate increased weakness, decreased dynamic balance/static balance, and difficulty walking;  patient will benefit from further skilled therapy to return to prior level of function.    Rehab Potential Good   Clinical Impairments Affecting Rehab Potential Positive: motivation, prior functional level; Negative: no active dorsiflexion, increased LUE tone   PT Frequency 2x / week   PT Duration 6 weeks   PT Treatment/Interventions ADLs/Self Care Home Management;Biofeedback;Canalith Repostioning;Cryotherapy;Electrical Stimulation;Iontophoresis 4mg /ml Dexamethasone;Aquatic Therapy;Moist Heat;Traction;Ultrasound;Gait training;Stair training;Functional mobility training;Therapeutic activities;DME Instruction;Therapeutic exercise;Balance training;Neuromuscular re-education;Cognitive remediation;Patient/family education;Orthotic Fit/Training;Manual techniques;Passive range of motion;Energy conservation;Taping;Vestibular   PT Next Visit Plan Clams[place]; DGI, LEFS, NME for L anterior tibialis, Walk-aide, progress balance, strengthening, and HEP; Consider sidelying hip abduction exercises   PT Home Exercise Plan Sit to stand, seated tband resisted clams, seated adduction squeeze   Consulted and Agree with Plan of Care Patient      Patient will benefit from skilled therapeutic intervention in order to improve the following deficits and impairments:  Abnormal gait, Decreased balance, Decreased coordination, Decreased mobility, Decreased range of motion, Decreased strength, Difficulty walking, Impaired tone, Impaired UE functional use  Visit Diagnosis: Muscle weakness (generalized)  Other  lack of coordination  Unsteadiness on feet     Problem List Patient Active Problem List   Diagnosis Date Noted  . 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 Neurocognitive disorder   . Left foot drop   . Benign essential HTN   . Hemiparesis affecting nondominant side as late effect of cerebrovascular accident (CVA) ([place]) 07/18/2016  . Right basal ganglia embolic stroke ([place]) 123XX123  . CVA (cerebral infarction) 07/15/2016  . TIA (transient ischemic attack) 07/14/2016  . HTN, goal below 140/80 07/14/2016  . Left facial numbness 07/14/2016  . Weakness of left side of body 07/14/2016    Blythe Stanford, PT DPT 10/30/2016, 9:58 AM  [place] MAIN [facility] SERVICES [address] [place], [place], [address] Phone: [phone]   Fax:  [phone]  Name: Jason Oconnor MRN: [id number] Date of Birth: 1955-12-19

## 2016-11-01 ENCOUNTER — Encounter: Payer: Self-pay | Admitting: Occupational Therapy

## 2016-11-01 ENCOUNTER — Ambulatory Visit: Payer: 59 | Admitting: Occupational Therapy

## 2016-11-01 ENCOUNTER — Ambulatory Visit: Payer: 59

## 2016-11-01 DIAGNOSIS — R2681 Unsteadiness on feet: Secondary | ICD-10-CM

## 2016-11-01 DIAGNOSIS — M6281 Muscle weakness (generalized): Secondary | ICD-10-CM

## 2016-11-01 DIAGNOSIS — R278 Other lack of coordination: Secondary | ICD-10-CM

## 2016-11-01 NOTE — Therapy (Signed)
[place] MAIN [facility] SERVICES [address] [place], [place], [address] Phone: [phone]   Fax:  [phone]  Occupational Therapy Treatment/Reassessment  Patient Details  Name: Jason Oconnor MRN: [id number] Date of Birth: 05-31-1956 No Data Recorded  Encounter Date: 11/01/2016      OT End of Session - 11/01/16 1136    Visit Number 23   Number of Visits 48   Date for OT Re-Evaluation 01/24/17   OT Start Time 0845   OT Stop Time 0932   OT Time Calculation (min) 47 min   Activity Tolerance Patient tolerated treatment well   Behavior During Therapy [facility] for tasks assessed/performed      Past Medical History:  Diagnosis Date  . Bone marrow disease   . Hypertension     History reviewed. No pertinent surgical history.  There were no vitals filed for this visit.      Subjective Assessment - 11/01/16 1131    Subjective  Patient reports he can tell his left arm has gotten better since starting therapy, just not as fast as he would like.  He reports some worry over decreased grip strength in his left hand but realizes he also has more coordination and fine motor skills than he did previously.     Pertinent History Patient reports on July 13, 2016 he was helping a friend doing home repairs and felt tired, went home and started to notice problems moving around.  The next morning he feel trying to put his slippers on, wife transported him to ER and [facility] for 3 days and then went to [facility] for REhab and was discharged this past Saturday to home.     Patient Stated Goals "I want to get back to close as normal as possible, play golf again, work in the garage."     Currently in Pain? Yes   Pain Score 3    Pain Location Wrist   Pain Orientation Left   Pain Descriptors / Indicators Aching   Pain Type Acute pain   Pain Onset 1 to 4 weeks ago   Pain Frequency Intermittent   Multiple Pain Sites No                      OT  Treatments/Exercises (OP) - 11/01/16 1133      ADLs   ADL Comments Reassessment of self-care/IADL tasks: Patient goals updated to reflect progress, he is now able to bathe independently in standing in the shower and no longer requires the use of the tub bench.  He is able to dress independently with the exception of occasional assistance for buttons.  Patient can now pick up his toothbrush with his left hand to put on the toothpaste.  As far as computer use, he is trying to incorporate left hand more to operate keyboard and is able to use left hand to hold down shift key, control key.  He is able to pick up light items however cannot always detect when they are in his hand and drops items often.  When picking up weighted items he is able to detect more and keep his grip.  He cannot reach into the microwave over the countertop to get food out with the use of his left hand, unable to lift heavy pots and pans, can only put items in the oven that require one handed use but anything that requires 2 hands he requires assistance.  He can wash some dishes with using  left hand as a stabilizer but has difficulty.  He is unable to cut meat and has difficulty holding onto the handle of the knife.  He is able to dust most surfaces.  He is starting to operate the vacuum but it is not as easy, has a hard time holding the power cord in the left hand while vacuuming with the right.  He would like to be able to reach and operate the turn signal in the car with his left hand.  He would also like to wash his truck using a brush but has not been able to complete to date.        Neurological Re-education Exercises   Other Exercises 1 Patient reassessment as follows:  Left shoulder flexion to 130 degrees (increased 5 degrees from eval) in sitting, ABD 140 (increased from 121), elbow flexion/extension full ROM, supination 50 degrees, wrist extension 30, flexion 40 degrees.  Right grip strength 115#, left 11#.  Lateral pinch left 11#,  3 point pinch 11#.     Other Exercises 2 Patient seen for strengthening exercises with 1# dowel for shoulder flexion, ABD, ADD, chest press, forwards/backwards circles for 10 reps each.  Advanced to 2# dowel for the above exercises with cues for technique and shoulder movements to decrease compensation, 10 reps each.  Juxtaciser wrist exercise for 5 reps with cues for form at wrist.                  OT Education - 11/01/16 1136    Education provided Yes   Education Details goals updated, POC, HEP, progress   Person(s) Educated Patient   Methods Explanation;Demonstration;Verbal cues   Comprehension Verbal cues required;Returned demonstration;Verbalized understanding             OT Long Term Goals - 11/01/16 [id number]      OT LONG TERM GOAL #1   Title Patient to improve left UE coordination sufficient to tie shoes with modified independence.    Baseline unable   Time 12   Period Weeks   Status New     OT LONG TERM GOAL #2   Title Patient to improve LUE coordination to complete buttons, snaps and zippers on clothing with modified independence.   Time 12   Period Weeks   Status On-going     OT LONG TERM GOAL #3   Title Patient to complete bathing with modified independence including using left arm to wash right side of body.    Time 12   Period Weeks   Status New     OT LONG TERM GOAL #4   Title Patient to improve left hand use to type on computer using both hands on home row keys, 2 lines with no errors in 5 minutes.    Baseline unable   Time 12   Period Weeks   Status On-going     OT LONG TERM GOAL #5   Title Patient will improve grip on left hand by 10# to stabilize jars and containers for opening.    Baseline unable   Time 12   Period Weeks   Status On-going     Long Term Additional Goals   Additional Long Term Goals Yes     OT LONG TERM GOAL #6   Title Patient will be able to demonstrate sweeping with use of bilateral UEs with modified independence.     Baseline unable   Time 12   Period Weeks   Status On-going     OT  LONG TERM GOAL #7   Title Patient will improve grip strength in left hand to be able to stabilize utensils to cut meat with modified independence.    Baseline unable    Time 12   Period Weeks   Status New     OT LONG TERM GOAL #8   Title Patient will be able to demonstrate increased strength in LUE to reach into microwave and retrieve plate of food with modified independence.   Baseline unable.    Time 12   Period Weeks   Status New     OT LONG TERM GOAL  #9   Baseline Patient will improve left UE control and reach to operate left turning signal in the car with modified independence.    Time 12   Period Weeks   Status New     OT LONG TERM GOAL  #10   TITLE Patient will be able to hold and manage large brush to wash his truck with modified independence.    Baseline unable   Time 12   Period Weeks   Status New               Plan - 11/01/16 1137    Clinical Impression Statement Patient has made excellent progress towards left UE funtional use, pain has decreased in intensity and has more towards end ranges of shoulder movement, still has some tightness and pain in the left wrist at times and especially with weight bearing tasks.  He did show a decrease in grip strength in his left hand which may be due to edema and working towards increased finger extension for reach as well as developing fine motor coordination.  He was unable to perform 9 hole peg test at eval, unable to pick up pegs.  He is now able to complete this in 54 secs.  Reassessment completed and goals updated.  Patient will continue to benefit from skilled OT services to maximize safety and independence in necessary daily tasks at home and in the community.     Rehab Potential Good   OT Frequency 2x / week   OT Duration 12 weeks   OT Treatment/Interventions Self-care/ADL training;Therapeutic exercise;Neuromuscular education;Visual/perceptual  remediation/compensation;Therapist, nutritional;Therapeutic exercises;Patient/family education;DME and/or AE instruction;Manual Therapy;Therapeutic activities;Balance training   Consulted and Agree with Plan of Care Patient      Patient will benefit from skilled therapeutic intervention in order to improve the following deficits and impairments:  Decreased knowledge of use of DME, Decreased coordination, Impaired sensation, Decreased activity tolerance, Decreased range of motion, Decreased strength, Decreased balance, Difficulty walking, Impaired UE functional use  Visit Diagnosis: Muscle weakness (generalized) - Plan: Ot plan of care cert/re-cert  Other lack of coordination - Plan: Ot plan of care cert/re-cert    Problem List Patient Active Problem List   Diagnosis Date Noted  . 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 Neurocognitive disorder   . Left foot drop   . Benign essential HTN   . Hemiparesis affecting nondominant side as late effect of cerebrovascular accident (CVA) ([place]) 07/18/2016  . Right basal ganglia embolic stroke ([place]) [id number]  . CVA (cerebral infarction) 07/15/2016  . TIA (transient ischemic attack) 07/14/2016  . HTN, goal below 140/80 07/14/2016  . Left facial numbness 07/14/2016  . Weakness of left side of body 07/14/2016   Moyinoluwa Dawe Oneita Jolly, OTR/L, CLT  Jason Oconnor 11/01/2016, 11:51 AM  [place] MAIN [facility] SERVICES [address] [place], [place], [address] Phone: [phone]  Fax:  [phone]  Name: Jason Oconnor MRN: [id number] Date of Birth: October 29, 1956

## 2016-11-01 NOTE — Therapy (Signed)
[place] MAIN [facility] SERVICES [address] [place], [place], [address] Phone: [phone]   Fax:  [phone]  Physical Therapy Treatment  Patient Details  Name: Jason Oconnor MRN: [id number] Date of Birth: Mar 26, 1956 Referring Provider: Dr. Naaman Plummer  Encounter Date: 11/01/2016      PT End of Session - 11/01/16 0858    Visit Number 22   Number of Visits 37   Date for PT Re-Evaluation 12/11/16   Authorization Type no g codes   PT Start Time 0800   PT Stop Time 0845   PT Time Calculation (min) 45 min   Equipment Utilized During Treatment Gait belt   Activity Tolerance Patient tolerated treatment well   Behavior During Therapy [facility] for tasks assessed/performed      Past Medical History:  Diagnosis Date  . Bone marrow disease   . Hypertension     History reviewed. No pertinent surgical history.  There were no vitals filed for this visit.      Subjective Assessment - 11/01/16 0820    Subjective Patient reports no major changes since the previous visit. States he continues to have minor cramps in the ankle.    Pertinent History Patient is a 60 year old right-handed male with history of hypertension. Per chart review patient is married. 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 Presented to [facility] 07/14/2016 with left-sided weakness and facial droop. Blood pressure elevated 170/106. Head CT scan negative. 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 CTA of the head showed no emergent large vessel occlusion or severe stenosis. Carotid Dopplers with no ICA stenosis. Patient did not receive TPA. Echocardiogram with ejection fraction of 60%. Normal systolic function. Placed on aspirin and Plavix for CVA prophylaxis. Subcutaneous Lovenox for DVT prophylaxis.  Patient  transferred to CIR on 07/16/2016. Pt discharged form CIR on 08/03/16. Pt reports that he made progress at CIR. No reported problems since returning home. Currently using a wide base quad cane in RUE and L AFO. Pt reports that he has made the last progress with respect to his LUE strength.    Patient Stated Goals "Get back as close as I can to normal." Pt would to be able to play golf. Pt would like to return to some kind of work.       TREATMENT:    Modalities  NMES pulse width 380uS, Intensity 44, Rate: 50Hz , waveform: asymmetrical 8 minutes total time. 15 sec on/ 30 sec off against a 10 # weight with patient in sitting to improve motor recruitment. Active ankle Dorsiflexion performed throughout entirety of motion.   Therapeutic Exercise TKE with single leg - x 30 with double black band Gastroc stretch dynamic - 2 x 20  Heel rasises in standing - x 25 Stepping over blue half foam - on L only x25 with cueing on tibialis anterior activation Tandem stance on airex with rotations with 4# ball - x 25 Stepping lunges onto dynadisc - x 21 on L Rotational step up onto bosu -x25 Single leg heel raises on leg press - 2 x15 120#       PT Education - 11/01/16 0851    Education provided Yes   Education Details Educated on form and technique throughout exercise session   Person(s) Educated Patient   Methods Explanation;Demonstration   Comprehension Verbalized understanding;Returned demonstration  PT Long Term Goals - 10/30/16 0955      PT LONG TERM GOAL #1   Title Pt will be independent with HEP in order to improve strength and balance in order to decrease fall risk and improve function at home and work.   Baseline 10/30/16: Mod-min A for exericise progression/ performance   Time 6   Period Weeks   Status On-going     PT LONG TERM GOAL #2   Title Pt will improve BERG by at least 3 points in order to demonstrate clinically significant improvement in balance.   Baseline  08/07/16: 49/56 10/02/16: 53/56   Time 8   Period Weeks   Status Achieved     PT LONG TERM GOAL #3   Title Pt will decrease TUG to below 14 seconds in order to demonstrate decreased fall risk    Baseline 08/07/16: 21.9 seconds; 10/02/16: 12.3    Time 8   Period Weeks   Status Achieved     PT LONG TERM GOAL #4   Title Pt will decrease 5TSTS by at least 3 seconds in order to demonstrate clinically significant improvement in LE strength   Baseline 08/07/16: 18.5 seconds; 10/02/16: 14.3   Time 8   Period Weeks   Status Achieved     PT LONG TERM GOAL #5   Title Pt will increase 10MWT by at least 0.13 m/s in order to demonstrate clinically significant improvement in community ambulation   Baseline 08/07/16: 0.53 m/s 10/02/16: .97 m/s   Time 8   Period Weeks   Status Achieved     PT LONG TERM GOAL #6   Title Pt will be able to swing a golf club safely in order to complete 9 holes of golf   Baseline 10/30/16: Unable to swing golf club safely   Time 6   Period Weeks   Status On-going     PT LONG TERM GOAL #7   Title Pt will improve 6MWT by at least 154ft  to demonstrate improved aerobic capacity/endurance   Baseline 582 ft 10/02/16: 1170ft   Time 7   Period Weeks   Status Achieved     PT LONG TERM GOAL #8   Title Patient will improve DGI by 4 points to demonstrate improvement in dynamic gait/balance and decrease fall risk   Baseline 10/02/16: 19/24; 10/30/16: 21/24   Time 6   Period Weeks   Status On-going               Plan - 11/01/16 [id number]    Clinical Impression Statement Pt demonstrates improved quadricep control today indicating functional carryover between visits. Although patient is improving, he continues to demonstrate decreased tibialias anterior strength and control when ambulating and will benefit from furhter skilled therapy to return to prior level of function.    Rehab Potential Good   Clinical Impairments Affecting Rehab Potential Positive: motivation, prior  functional level; Negative: no active dorsiflexion, increased LUE tone   PT Frequency 2x / week   PT Duration 6 weeks   PT Treatment/Interventions ADLs/Self Care Home Management;Biofeedback;Canalith Repostioning;Cryotherapy;Electrical Stimulation;Iontophoresis 4mg /ml Dexamethasone;Aquatic Therapy;Moist Heat;Traction;Ultrasound;Gait training;Stair training;Functional mobility training;Therapeutic activities;DME Instruction;Therapeutic exercise;Balance training;Neuromuscular re-education;Cognitive remediation;Patient/family education;Orthotic Fit/Training;Manual techniques;Passive range of motion;Energy conservation;Taping;Vestibular   PT Next Visit Plan Clamshell; DGI, LEFS, NME for L anterior tibialis, Walk-aide, progress balance, strengthening, and HEP; Consider sidelying hip abduction exercises   PT Home Exercise Plan Sit to stand, seated tband resisted clams, seated adduction squeeze   Consulted and Agree with Plan of Care  Patient      Patient will benefit from skilled therapeutic intervention in order to improve the following deficits and impairments:  Abnormal gait, Decreased balance, Decreased coordination, Decreased mobility, Decreased range of motion, Decreased strength, Difficulty walking, Impaired tone, Impaired UE functional use  Visit Diagnosis: Muscle weakness (generalized)  Other lack of coordination  Unsteadiness on feet     Problem List Patient Active Problem List   Diagnosis Date Noted  . 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 Neurocognitive disorder   . Left foot drop   . Benign essential HTN   . Hemiparesis affecting nondominant side as late effect of cerebrovascular accident (CVA) ([place]) 07/18/2016  . Right basal ganglia embolic stroke ([place]) [id number]  . CVA (cerebral infarction) 07/15/2016  . TIA (transient ischemic attack) 07/14/2016  . HTN, goal below 140/80 07/14/2016  . Left facial numbness 07/14/2016  . Weakness of left side of body 07/14/2016     Blythe Stanford, PT DPT 11/01/2016, 9:02 AM  [place] MAIN [facility] SERVICES [address] [place], [place], [address] Phone: [phone]   Fax:  [phone]  Name: Jason Oconnor MRN: [id number] Date of Birth: Jan 28, 1956

## 2016-11-04 ENCOUNTER — Telehealth: Payer: Self-pay | Admitting: *Deleted

## 2016-11-04 ENCOUNTER — Ambulatory Visit: Payer: 59 | Admitting: Occupational Therapy

## 2016-11-04 ENCOUNTER — Ambulatory Visit: Payer: 59

## 2016-11-04 DIAGNOSIS — R278 Other lack of coordination: Secondary | ICD-10-CM

## 2016-11-04 DIAGNOSIS — R2681 Unsteadiness on feet: Secondary | ICD-10-CM

## 2016-11-04 DIAGNOSIS — M6281 Muscle weakness (generalized): Secondary | ICD-10-CM

## 2016-11-04 NOTE — Therapy (Signed)
[place] MAIN [facility] SERVICES [address] [place], [place], [address] Phone: [phone]   Fax:  [phone]  Physical Therapy Treatment  Patient Details  Name: Eugene Shadowens MRN: [id number] Date of Birth: [birth year] Referring Provider: Dr. Naaman Plummer  Encounter Date: 11/04/2016      PT End of Session - 11/04/16 0914    Visit Number 23   Number of Visits 37   Date for PT Re-Evaluation 12/11/16   Authorization Type no g codes   PT Start Time 0800   PT Stop Time 0844   PT Time Calculation (min) 44 min   Equipment Utilized During Treatment Gait belt   Activity Tolerance Patient tolerated treatment well   Behavior During Therapy [facility] for tasks assessed/performed      Past Medical History:  Diagnosis Date  . Bone marrow disease   . Hypertension     History reviewed. No pertinent surgical history.  There were no vitals filed for this visit.      Subjective Assessment - 11/04/16 0813    Subjective Patient reports he had increased weakness (UE and LE) and spasms along his left side last night. States the symptoms lasted for ~ 2 hours until they decreased after taking a hot shower. Patient reports minor residual LE and UE weakness since the symptoms onset; but overall feels he's returned to baseline.    Patient is accompained by: Family member  wife   Pertinent History Patient is a 60 year old right-handed male with history of hypertension. Per chart review patient is married. 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 Presented to [facility] 07/14/2016 with left-sided weakness and facial droop. Blood pressure elevated 170/106. Head CT scan negative. 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 CTA of the head showed no emergent large vessel occlusion or severe stenosis.  Carotid Dopplers with no ICA stenosis. Patient did not receive TPA. Echocardiogram with ejection fraction of 60%. Normal systolic function. Placed on aspirin and Plavix for CVA prophylaxis. Subcutaneous Lovenox for DVT prophylaxis.  Patient transferred to CIR on 07/16/2016. Pt discharged form CIR on 08/03/16. Pt reports that he made progress at CIR. No reported problems since returning home. Currently using a wide base quad cane in RUE and L AFO. Pt reports that he has made the last progress with respect to his LUE strength.    Patient Stated Goals "Get back as close as I can to normal." Pt would to be able to play golf. Pt would like to return to some kind of work.        TREATMENT:    Modalities  NMES pulse width 380uS, Intensity 44, Rate: 50Hz , waveform: asymmetrical 8 minutes total time. 15 sec on/ 30 sec off against a 10 # weight with patient in sitting to improve motor recruitment. Active ankle Dorsiflexion performed throughout entirety of motion.   Therapeutic Exercise TKE with single leg - x 30 with double black band Gastroc stretch dynamic - 2 x 20  Side stepping over blue half foam - on L only x25 with cueing on tibialis anterior activation Stepping lunges onto dynadisc - x 21 on L Leg press quad 150# -- 2 x 20 Single leg heel raises on leg press - 2 x15 120#        PT Education - 11/04/16 0911    Education provided Yes  Education Details Educated on spasticity and possible explanation of symptoms   Person(s) Educated Patient   Methods Explanation;Demonstration   Comprehension Verbalized understanding;Returned demonstration             PT Long Term Goals - 10/30/16 0955      PT LONG TERM GOAL #1   Title Pt will be independent with HEP in order to improve strength and balance in order to decrease fall risk and improve function at home and work.   Baseline 10/30/16: Mod-min A for exericise progression/ performance   Time 6   Period Weeks   Status On-going     PT  LONG TERM GOAL #2   Title Pt will improve BERG by at least 3 points in order to demonstrate clinically significant improvement in balance.   Baseline 08/07/16: 49/56 10/02/16: 53/56   Time 8   Period Weeks   Status Achieved     PT LONG TERM GOAL #3   Title Pt will decrease TUG to below 14 seconds in order to demonstrate decreased fall risk    Baseline 08/07/16: 21.9 seconds; 10/02/16: 12.3    Time 8   Period Weeks   Status Achieved     PT LONG TERM GOAL #4   Title Pt will decrease 5TSTS by at least 3 seconds in order to demonstrate clinically significant improvement in LE strength   Baseline 08/07/16: 18.5 seconds; 10/02/16: 14.3   Time 8   Period Weeks   Status Achieved     PT LONG TERM GOAL #5   Title Pt will increase 10MWT by at least 0.13 m/s in order to demonstrate clinically significant improvement in community ambulation   Baseline 08/07/16: 0.53 m/s 10/02/16: .97 m/s   Time 8   Period Weeks   Status Achieved     PT LONG TERM GOAL #6   Title Pt will be able to swing a golf club safely in order to complete 9 holes of golf   Baseline 10/30/16: Unable to swing golf club safely   Time 6   Period Weeks   Status On-going     PT LONG TERM GOAL #7   Title Pt will improve 6MWT by at least 186ft  to demonstrate improved aerobic capacity/endurance   Baseline 582 ft 10/02/16: 1112ft   Time 7   Period Weeks   Status Achieved     PT LONG TERM GOAL #8   Title Patient will improve DGI by 4 points to demonstrate improvement in dynamic gait/balance and decrease fall risk   Baseline 10/02/16: 19/24; 10/30/16: 21/24   Time 6   Period Weeks   Status On-going               Plan - 11/04/16 0914    Clinical Impression Statement Decreased exercise intensity today secondary to onset of  weakness symptoms the day prior. Patient reports slight increase in weakness during exercise but reports he close to baseline strength with exercises. Patient's gait analysis was the same as it was the day  prior and BP was within normal limits. Patient reports he is going to see his PCP today and called him last night. Patient will benefit from further skilled therapy to return to prior level of function.    Rehab Potential Good   Clinical Impairments Affecting Rehab Potential Positive: motivation, prior functional level; Negative: no active dorsiflexion, increased LUE tone   PT Frequency 2x / week   PT Duration 6 weeks   PT Treatment/Interventions ADLs/Self Care Home  Management;Biofeedback;Canalith Repostioning;Cryotherapy;Electrical Stimulation;Iontophoresis 4mg /ml Dexamethasone;Aquatic Therapy;Moist Heat;Traction;Ultrasound;Gait training;Stair training;Functional mobility training;Therapeutic activities;DME Instruction;Therapeutic exercise;Balance training;Neuromuscular re-education;Cognitive remediation;Patient/family education;Orthotic Fit/Training;Manual techniques;Passive range of motion;Energy conservation;Taping;Vestibular   PT Next Visit Plan Clamshell; DGI, LEFS, NME for L anterior tibialis, Walk-aide, progress balance, strengthening, and HEP; Consider sidelying hip abduction exercises   PT Home Exercise Plan Sit to stand, seated tband resisted clams, seated adduction squeeze   Consulted and Agree with Plan of Care Patient      Patient will benefit from skilled therapeutic intervention in order to improve the following deficits and impairments:  Abnormal gait, Decreased balance, Decreased coordination, Decreased mobility, Decreased range of motion, Decreased strength, Difficulty walking, Impaired tone, Impaired UE functional use  Visit Diagnosis: Muscle weakness (generalized)  Other lack of coordination  Unsteadiness on feet     Problem List Patient Active Problem List   Diagnosis Date Noted  . 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 Neurocognitive disorder   . Left foot drop   . Benign essential HTN   . Hemiparesis affecting nondominant side as late effect of  cerebrovascular accident (CVA) ([place]) 07/18/2016  . Right basal ganglia embolic stroke ([place]) [id number]  . CVA (cerebral infarction) 07/15/2016  . TIA (transient ischemic attack) 07/14/2016  . HTN, goal below 140/80 07/14/2016  . Left facial numbness 07/14/2016  . Weakness of left side of body 07/14/2016    Blythe Stanford, PT DPT 11/04/2016, 9:33 AM  [place] MAIN [facility] SERVICES [address] [place], [place], [address] Phone: [phone]   Fax:  [phone]  Name: Barkon Clink MRN: [id number] Date of Birth: [birth year]

## 2016-11-04 NOTE — Telephone Encounter (Addendum)
Jason Oconnor called about Jason Oconnor having some spasm on L side and feeling weak. She was asking if this was an on call like if the MD could call her back.  I attempted to reach them this morning when message received after office open, but there is no answer. I have called and left a message for them to call back but to be aware that they should go to ED if he is still feeling bad.

## 2016-11-05 NOTE — Telephone Encounter (Signed)
Message given to Mr Arquilla and Baclofen added to his medication list

## 2016-11-05 NOTE — Telephone Encounter (Signed)
He saw his primary yesterday and he sent in an order for baclofen 10 mg tid.  He has not picked up yet because he wanted to clear through you first. ( he also said the tizanidine he took for a few days and it did nothing.)

## 2016-11-05 NOTE — Telephone Encounter (Signed)
It sounds like spasticity, please see if patient would like some medication adjustments for this

## 2016-11-05 NOTE — Telephone Encounter (Signed)
I was able to speak with Jason Oconnor this am and he relates that he had gone out for a walk and got really cold and his muscles felt like they were really fighting each other on the left side. He felt like they were going in opposite directions and felt weaker.  He is somewhat better and had talked with his primary but "he is not a stroke md" and thinks its the spasticity but is not sure.

## 2016-11-05 NOTE — Telephone Encounter (Signed)
That is fine, the tizanidine dose may need to be increased if the baclofen is not helpful

## 2016-11-08 ENCOUNTER — Encounter: Payer: Self-pay | Admitting: Occupational Therapy

## 2016-11-08 ENCOUNTER — Ambulatory Visit: Payer: 59 | Admitting: Occupational Therapy

## 2016-11-08 ENCOUNTER — Ambulatory Visit: Payer: 59

## 2016-11-08 DIAGNOSIS — M6281 Muscle weakness (generalized): Secondary | ICD-10-CM

## 2016-11-08 DIAGNOSIS — R278 Other lack of coordination: Secondary | ICD-10-CM

## 2016-11-08 DIAGNOSIS — R2681 Unsteadiness on feet: Secondary | ICD-10-CM

## 2016-11-08 NOTE — Therapy (Signed)
[place] MAIN [facility] SERVICES [address] [place], [place], [address] Phone: [phone]   Fax:  [phone]  Occupational Therapy Treatment  Patient Details  Name: Jason Oconnor MRN: [id number] Date of Birth: [birth year] No Data Recorded  Encounter Date: 11/04/2016      OT End of Session - 11/08/16 1312    Visit Number 24   Number of Visits 48   Date for OT Re-Evaluation 01/24/17   OT Start Time 0845   OT Stop Time 0931   OT Time Calculation (min) 46 min   Activity Tolerance Patient tolerated treatment well   Behavior During Therapy [facility] for tasks assessed/performed      Past Medical History:  Diagnosis Date  . Bone marrow disease   . Hypertension     History reviewed. No pertinent surgical history.  There were no vitals filed for this visit.      Subjective Assessment - 11/08/16 1301    Subjective  Wife came to appointment this date, patient reports he had an episode over the weekend where he had gone out for a walk (40 degrees outside) and after he came back he felt weak, felt like his muscles were tightening up and they were worried he could be having a mini stroke, he denied any other symptoms.  They called his doctor to seek medical advice, he felt better later but also wants to call Dr. Letta Pate just to make sure.     Pertinent History Patient reports on July 13, 2016 he was helping a friend doing home repairs and felt tired, went home and started to notice problems moving around.  The next morning he feel trying to put his slippers on, wife transported him to ER and [facility] for 3 days and then went to [facility] for REhab and was discharged this past Saturday to home.     Patient Stated Goals "I want to get back to close as normal as possible, play golf again, work in the garage."     Currently in Pain? No/denies   Pain Score 0-No pain                      OT Treatments/Exercises (OP) - 11/07/16 1307       Fine Motor Coordination   Other Fine Motor Exercises Patient seen this date for manipulation of small objects with left hand, cues for translatory skills of the hand, isolating finger movements and using the hand for storage.  Variety of items used for varying shapes and sizes.      Neurological Re-education Exercises   Other Exercises 1 Patient seen this date for reassessment of grip on left hand 15# which was increased from last week.  Patient seen for LUE stregnthening exercises with weight tower, triceps press with 7.5# for 10 reps BUE use with rope attachment, 12.5# for 10 reps, cues for positioning and proper form.  Biceps curl with 2.5# for 2 sets of 10 reps.  One handed bicep curl with rope attachment 2.5# with cues to decrease compensatory movements at the shoulder.   Shoulder finger ladder for 2 reps up and down with cues for isolating finger movements and decrease shoulder hiking.                  OT Education - 11/08/16 1311    Education provided Yes   Education Details Strengthening exercises for LUE, factors that affect spasticity.   Person(s) Educated Patient;Spouse   Methods  Explanation;Demonstration;Verbal cues   Comprehension Verbalized understanding;Returned demonstration;Verbal cues required             OT Long Term Goals - 11/01/16 0852      OT LONG TERM GOAL #1   Title Patient to improve left UE coordination sufficient to tie shoes with modified independence.    Baseline unable   Time 12   Period Weeks   Status New     OT LONG TERM GOAL #2   Title Patient to improve LUE coordination to complete buttons, snaps and zippers on clothing with modified independence.   Time 12   Period Weeks   Status On-going     OT LONG TERM GOAL #3   Title Patient to complete bathing with modified independence including using left arm to wash right side of body.    Time 12   Period Weeks   Status New     OT LONG TERM GOAL #4   Title Patient to improve left hand use  to type on computer using both hands on home row keys, 2 lines with no errors in 5 minutes.    Baseline unable   Time 12   Period Weeks   Status On-going     OT LONG TERM GOAL #5   Title Patient will improve grip on left hand by 10# to stabilize jars and containers for opening.    Baseline unable   Time 12   Period Weeks   Status On-going     Long Term Additional Goals   Additional Long Term Goals Yes     OT LONG TERM GOAL #6   Title Patient will be able to demonstrate sweeping with use of bilateral UEs with modified independence.    Baseline unable   Time 12   Period Weeks   Status On-going     OT LONG TERM GOAL #7   Title Patient will improve grip strength in left hand to be able to stabilize utensils to cut meat with modified independence.    Baseline unable    Time 12   Period Weeks   Status New     OT LONG TERM GOAL #8   Title Patient will be able to demonstrate increased strength in LUE to reach into microwave and retrieve plate of food with modified independence.   Baseline unable.    Time 12   Period Weeks   Status New     OT LONG TERM GOAL  #9   Baseline Patient will improve left UE control and reach to operate left turning signal in the car with modified independence.    Time 12   Period Weeks   Status New     OT LONG TERM GOAL  #10   TITLE Patient will be able to hold and manage large brush to wash his truck with modified independence.    Baseline unable   Time 12   Period Weeks   Status New               Plan - 11/07/16 1312    Clinical Impression Statement Patient concerned today about an episode over the weekend, recommended he call his doctor to discuss incident.  From what the patient describes, it sounds like he went out in really cold temperature for a walk and after returning he demonstrated signs of fatigue and increased spasticity on his left side.  reports his muscles felt like they were fighting each other."Discussed factors that can  affect tone/spasticity and encouraged  patient to discuss with MD to determine if any other medical intervention is required.  He was reassessed and did not appear to have any changes from last week in regards to strength, ROM or function of LUE. Continue to work towards goals in [place].    Rehab Potential Good   OT Frequency 2x / week   OT Duration 12 weeks   OT Treatment/Interventions Self-care/ADL training;Therapeutic exercise;Neuromuscular education;Visual/perceptual remediation/compensation;Therapist, nutritional;Therapeutic exercises;Patient/family education;DME and/or AE instruction;Manual Therapy;Therapeutic activities;Balance training   Consulted and Agree with Plan of Care Patient      Patient will benefit from skilled therapeutic intervention in order to improve the following deficits and impairments:  Decreased knowledge of use of DME, Decreased coordination, Impaired sensation, Decreased activity tolerance, Decreased range of motion, Decreased strength, Decreased balance, Difficulty walking, Impaired UE functional use  Visit Diagnosis: Muscle weakness (generalized)  Other lack of coordination    Problem List Patient Active Problem List   Diagnosis Date Noted  . 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 Neurocognitive disorder   . Left foot drop   . Benign essential HTN   . Hemiparesis affecting nondominant side as late effect of cerebrovascular accident (CVA) ([place]) 07/18/2016  . Right basal ganglia embolic stroke ([place]) 123XX123  . CVA (cerebral infarction) 07/15/2016  . TIA (transient ischemic attack) 07/14/2016  . HTN, goal below 140/80 07/14/2016  . Left facial numbness 07/14/2016  . Weakness of left side of body 07/14/2016   Ramelo Oetken T Sharica Roedel, OTR/L, CLT  Paraskevi Funez 11/08/2016, 1:16 PM  [place] MAIN [facility] SERVICES [address] [place], [place], [address] Phone: [phone]   Fax:  [phone]  Name: Jason Oconnor MRN: [id number] Date of Birth: [birth year]

## 2016-11-08 NOTE — Therapy (Signed)
[place] MAIN [facility] SERVICES [address] [place], [place], [address] Phone: [phone]   Fax:  [phone]  Occupational Therapy Treatment  Patient Details  Name: Jason Oconnor MRN: [id number] Date of Birth: [birth year] No Data Recorded  Encounter Date: 11/08/2016      OT End of Session - 11/08/16 1556    Visit Number 25   Number of Visits 50   Date for OT Re-Evaluation 01/24/17   OT Start Time 0845   OT Stop Time 0935   OT Time Calculation (min) 50 min   Activity Tolerance Patient tolerated treatment well   Behavior During Therapy [facility] for tasks assessed/performed      Past Medical History:  Diagnosis Date  . Bone marrow disease   . Hypertension     History reviewed. No pertinent surgical history.  There were no vitals filed for this visit.      Subjective Assessment - 11/08/16 1549    Subjective  Patient reports he did call the doctor on Monday and he felt the episode he had last weekend was related to spasticity.  He is now taking baclofen to see if this will decrease spasticity.     Pertinent History Patient reports on July 13, 2016 he was helping a friend doing home repairs and felt tired, went home and started to notice problems moving around.  The next morning he feel trying to put his slippers on, wife transported him to ER and [facility] for 3 days and then went to [facility] for REhab and was discharged this past Saturday to home.     Patient Stated Goals "I want to get back to close as normal as possible, play golf again, work in the garage."     Currently in Pain? No/denies   Pain Score 0-No pain                      OT Treatments/Exercises (OP) - 11/08/16 1551      Fine Motor Coordination   Other Fine Motor Exercises Patient seen this date for fine motor coordination task of manipulation of 1/2 inch sized bead and stringing onto lace with cues to pull towards left for arm ABD.  Patient dropping 6 out of  20 beads and retrieved each one at the end of the session with CGA for bending.       Neurological Re-education Exercises   Other Exercises 1 Patient seen this date for UBE for 8 minutes with resistance of 1.0 to 2.0 with bilateral hand use and then alternating to one handed left hand use for backwards/forwards directions, occasional guiding and regripping of left hand but did not need strapping of left hand to  handle this date as he did in the past.   Triceps cable press 7.5# for 10 reps and 12.5# for 10 reps for 2 sets, cues for technique.  Bicep curl 2.5# for 2 sets of 10 reps, chest press with left UE, 2.5# for 10 reps for 2 sets.  Resistive wrist extension velcro board with left hand up and back 3 trials with cues.  finger extension with resistance board for 4 trials.                 OT Education - 11/08/16 1556    Education provided Yes   Education Details HEP   Person(s) Educated Patient   Methods Explanation;Demonstration;Verbal cues   Comprehension Verbal cues required;Returned demonstration;Verbalized understanding  OT Long Term Goals - 11/01/16 [id number]      OT LONG TERM GOAL #1   Title Patient to improve left UE coordination sufficient to tie shoes with modified independence.    Baseline unable   Time 12   Period Weeks   Status New     OT LONG TERM GOAL #2   Title Patient to improve LUE coordination to complete buttons, snaps and zippers on clothing with modified independence.   Time 12   Period Weeks   Status On-going     OT LONG TERM GOAL #3   Title Patient to complete bathing with modified independence including using left arm to wash right side of body.    Time 12   Period Weeks   Status New     OT LONG TERM GOAL #4   Title Patient to improve left hand use to type on computer using both hands on home row keys, 2 lines with no errors in 5 minutes.    Baseline unable   Time 12   Period Weeks   Status On-going     OT LONG TERM GOAL #5    Title Patient will improve grip on left hand by 10# to stabilize jars and containers for opening.    Baseline unable   Time 12   Period Weeks   Status On-going     Long Term Additional Goals   Additional Long Term Goals Yes     OT LONG TERM GOAL #6   Title Patient will be able to demonstrate sweeping with use of bilateral UEs with modified independence.    Baseline unable   Time 12   Period Weeks   Status On-going     OT LONG TERM GOAL #7   Title Patient will improve grip strength in left hand to be able to stabilize utensils to cut meat with modified independence.    Baseline unable    Time 12   Period Weeks   Status New     OT LONG TERM GOAL #8   Title Patient will be able to demonstrate increased strength in LUE to reach into microwave and retrieve plate of food with modified independence.   Baseline unable.    Time 12   Period Weeks   Status New     OT LONG TERM GOAL  #9   Baseline Patient will improve left UE control and reach to operate left turning signal in the car with modified independence.    Time 12   Period Weeks   Status New     OT LONG TERM GOAL  #10   TITLE Patient will be able to hold and manage large brush to wash his truck with modified independence.    Baseline unable   Time 12   Period Weeks   Status New               Plan - 11/08/16 1557    Clinical Impression Statement Patient feels more at ease today about the episode last weekend after calling his doctor.  He has only been taking the baclofen 1 time a day versus 3 times because he is unsure of how it will affect him and doesnt' want to take a chance on driving if it makes him drowsy.  He continues to progress towards goals and was able to perform resistive UBE this date with using his own grip and did not require strap.  Continue to work towards goals to increase independence in daily tasks.  Rehab Potential Good   OT Frequency 2x / week   OT Duration 12 weeks   Consulted and Agree  with Plan of Care Patient      Patient will benefit from skilled therapeutic intervention in order to improve the following deficits and impairments:  Decreased knowledge of use of DME, Decreased coordination, Impaired sensation, Decreased activity tolerance, Decreased range of motion, Decreased strength, Decreased balance, Difficulty walking, Impaired UE functional use  Visit Diagnosis: Muscle weakness (generalized)  Other lack of coordination    Problem List Patient Active Problem List   Diagnosis Date Noted  . 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 Neurocognitive disorder   . Left foot drop   . Benign essential HTN   . Hemiparesis affecting nondominant side as late effect of cerebrovascular accident (CVA) ([place]) 07/18/2016  . Right basal ganglia embolic stroke ([place]) [id number]  . CVA (cerebral infarction) 07/15/2016  . TIA (transient ischemic attack) 07/14/2016  . HTN, goal below 140/80 07/14/2016  . Left facial numbness 07/14/2016  . Weakness of left side of body 07/14/2016   Kysean Sweet Oneita Jolly, OTR/L, CLT Alla Sloma 11/08/2016, 3:59 PM  [place] MAIN [facility] SERVICES [address] [place], [place], [address] Phone: [phone]   Fax:  [phone]  Name: Cheveyo Garbacz MRN: [id number] Date of Birth: 1956/02/18

## 2016-11-08 NOTE — Therapy (Signed)
[place] MAIN [facility] SERVICES [address] [place], [place], [address] Phone: [phone]   Fax:  [phone]  Physical Therapy Treatment  Patient Details  Name: Jason Oconnor MRN: [id number] Date of Birth: 05-Jun-1956 Referring Provider: Dr. Naaman Plummer  Encounter Date: 11/08/2016      PT End of Session - 11/08/16 0827    Visit Number 24   Number of Visits 37   Date for PT Re-Evaluation 12/11/16   Authorization Type no g codes   PT Start Time 0800   PT Stop Time 0845   PT Time Calculation (min) 45 min   Equipment Utilized During Treatment Gait belt   Activity Tolerance Patient tolerated treatment well   Behavior During Therapy [facility] for tasks assessed/performed      Past Medical History:  Diagnosis Date  . Bone marrow disease   . Hypertension     History reviewed. No pertinent surgical history.  There were no vitals filed for this visit.      Subjective Assessment - 11/08/16 0811    Subjective Patient reports he went to his physician last Tuesday and states the increased weakness feeeling from the previous visit was from increased spasticity, and his doctor prescribe him baclophen. Pt reports he was able to walk 2 times yesterday.    Patient is accompained by: Family member  wife   Pertinent History Patient is a 60 year old right-handed male with history of hypertension. Per chart review patient is married. 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 Presented to [facility] 07/14/2016 with left-sided weakness and facial droop. Blood pressure elevated 170/106. Head CT scan negative. 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 CTA of the head showed no emergent large vessel occlusion or severe stenosis. Carotid Dopplers with no ICA stenosis. Patient did not receive  TPA. Echocardiogram with ejection fraction of 60%. Normal systolic function. Placed on aspirin and Plavix for CVA prophylaxis. Subcutaneous Lovenox for DVT prophylaxis.  Patient transferred to [facility] on 07/16/2016. Pt discharged form [facility] on 08/03/16. Pt reports that he made progress at CIR. No reported problems since returning home. Currently using a wide base quad cane in RUE and L AFO. Pt reports that he has made the last progress with respect to his LUE strength.    Patient Stated Goals "Get back as close as I can to normal." Pt would to be able to play golf. Pt would like to return to some kind of work.       TREATMENT:    Modalities  NMES pulse width 380uS, Intensity 44, Rate: 50Hz , waveform: asymmetrical 8 minutes total time. 15 sec on/ 30 sec off against a 10 # weight with patient in sitting to improve motor recruitment. Active ankle Dorsiflexion performed throughout entirety of motion.   Therapeutic Exercise: Side stepping lunges onto Bosu - x 21 B Side lunges onto Bosu - x 21 B  Stool scoots around the gym - x 176ft Amb with cueing to improve heel strike, decrease circumduction, and toe off on the left side Stepping at amb to improve heel strike and toe off on the L - x25 Heel raises off of blue foam - 2 x 20  TKE with single leg - x 30 with double black band Gastroc stretch dynamic - 2 x 20sec       PT Education - 11/08/16 [id number]  Education provided Yes   Education Details Educated on form and technique throughout session   Person(s) Educated Patient   Methods Explanation;Demonstration   Comprehension Verbalized understanding;Returned demonstration             PT Long Term Goals - 10/30/16 0955      PT LONG TERM GOAL #1   Title Pt will be independent with HEP in order to improve strength and balance in order to decrease fall risk and improve function at home and work.   Baseline 10/30/16: Mod-min A for exericise progression/ performance   Time 6   Period Weeks   Status  On-going     PT LONG TERM GOAL #2   Title Pt will improve BERG by at least 3 points in order to demonstrate clinically significant improvement in balance.   Baseline 08/07/16: 49/56 10/02/16: 53/56   Time 8   Period Weeks   Status Achieved     PT LONG TERM GOAL #3   Title Pt will decrease TUG to below 14 seconds in order to demonstrate decreased fall risk    Baseline 08/07/16: 21.9 seconds; 10/02/16: 12.3    Time 8   Period Weeks   Status Achieved     PT LONG TERM GOAL #4   Title Pt will decrease 5TSTS by at least 3 seconds in order to demonstrate clinically significant improvement in LE strength   Baseline 08/07/16: 18.5 seconds; 10/02/16: 14.3   Time 8   Period Weeks   Status Achieved     PT LONG TERM GOAL #5   Title Pt will increase 10MWT by at least 0.13 m/s in order to demonstrate clinically significant improvement in community ambulation   Baseline 08/07/16: 0.53 m/s 10/02/16: .97 m/s   Time 8   Period Weeks   Status Achieved     PT LONG TERM GOAL #6   Title Pt will be able to swing a golf club safely in order to complete 9 holes of golf   Baseline 10/30/16: Unable to swing golf club safely   Time 6   Period Weeks   Status On-going     PT LONG TERM GOAL #7   Title Pt will improve 6MWT by at least 145ft  to demonstrate improved aerobic capacity/endurance   Baseline 582 ft 10/02/16: 1175ft   Time 7   Period Weeks   Status Achieved     PT LONG TERM GOAL #8   Title Patient will improve DGI by 4 points to demonstrate improvement in dynamic gait/balance and decrease fall risk   Baseline 10/02/16: 19/24; 10/30/16: 21/24   Time 6   Period Weeks   Status On-going               Plan - 11/08/16 0827    Clinical Impression Statement Patient required less UE support to perform balancing activities indicating improved dynamic balance. Patient demonstrates increased onset of fatigue during exercise performance indicating decreased muscular endurance with exercise. Patient will  benefit from further skilled therapy to return to prior level of function.    Rehab Potential Good   Clinical Impairments Affecting Rehab Potential Positive: motivation, prior functional level; Negative: no active dorsiflexion, increased LUE tone   PT Frequency 2x / week   PT Duration 6 weeks   PT Treatment/Interventions ADLs/Self Care Home Management;Biofeedback;Canalith Repostioning;Cryotherapy;Electrical Stimulation;Iontophoresis 4mg /ml Dexamethasone;Aquatic Therapy;Moist Heat;Traction;Ultrasound;Gait training;Stair training;Functional mobility training;Therapeutic activities;DME Instruction;Therapeutic exercise;Balance training;Neuromuscular re-education;Cognitive remediation;Patient/family education;Orthotic Fit/Training;Manual techniques;Passive range of motion;Energy conservation;Taping;Vestibular   PT Next Visit Plan Clamshell; DGI,  LEFS, NME for L anterior tibialis, Walk-aide, progress balance, strengthening, and HEP; Consider sidelying hip abduction exercises   PT Home Exercise Plan Sit to stand, seated tband resisted clams, seated adduction squeeze   Consulted and Agree with Plan of Care Patient      Patient will benefit from skilled therapeutic intervention in order to improve the following deficits and impairments:  Abnormal gait, Decreased balance, Decreased coordination, Decreased mobility, Decreased range of motion, Decreased strength, Difficulty walking, Impaired tone, Impaired UE functional use  Visit Diagnosis: Muscle weakness (generalized)  Unsteadiness on feet  Other lack of coordination     Problem List Patient Active Problem List   Diagnosis Date Noted  . 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 Neurocognitive disorder   . Left foot drop   . Benign essential HTN   . Hemiparesis affecting nondominant side as late effect of cerebrovascular accident (CVA) ([place]) 07/18/2016  . Right basal ganglia embolic stroke ([place]) [id number]  . CVA (cerebral infarction)  07/15/2016  . TIA (transient ischemic attack) 07/14/2016  . HTN, goal below 140/80 07/14/2016  . Left facial numbness 07/14/2016  . Weakness of left side of body 07/14/2016    Blythe Stanford, PT DPT 11/08/2016, 8:56 AM  [place] MAIN [facility] SERVICES [address] [place], [place], [address] Phone: [phone]   Fax:  [phone]  Name: Jason Oconnor MRN: [id number] Date of Birth: 01-28-1956

## 2016-11-12 ENCOUNTER — Ambulatory Visit: Payer: 59

## 2016-11-12 ENCOUNTER — Ambulatory Visit: Payer: 59 | Admitting: Occupational Therapy

## 2016-11-12 DIAGNOSIS — M6281 Muscle weakness (generalized): Secondary | ICD-10-CM | POA: Diagnosis not present

## 2016-11-12 DIAGNOSIS — R2681 Unsteadiness on feet: Secondary | ICD-10-CM

## 2016-11-12 DIAGNOSIS — R278 Other lack of coordination: Secondary | ICD-10-CM

## 2016-11-12 NOTE — Therapy (Signed)
[place] MAIN [facility] SERVICES [address] [place], [place], [address] Phone: [phone]   Fax:  [phone]  Physical Therapy Treatment  Patient Details  Name: Jason Oconnor MRN: [id number] Date of Birth: 05/15/1956 Referring Provider: Dr. Naaman Plummer  Encounter Date: 11/12/2016      PT End of Session - 11/12/16 1412    Visit Number 25   Number of Visits 37   Date for PT Re-Evaluation 12/11/16   Authorization Type no g codes   PT Start Time February 25, 1344   PT Stop Time 1430   PT Time Calculation (min) 45 min   Equipment Utilized During Treatment Gait belt   Activity Tolerance Patient tolerated treatment well   Behavior During Therapy [facility] for tasks assessed/performed      Past Medical History:  Diagnosis Date  . Bone marrow disease   . Hypertension     History reviewed. No pertinent surgical history.  There were no vitals filed for this visit.      Subjective Assessment - 11/12/16 1357    Subjective Patient reports no new complaints and minimal fatigue from visit with OT.   Patient is accompained by: Family member  wife   Pertinent History Patient is a 60 year old right-handed male with history of hypertension. Per chart review patient is married. 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 Presented to [name] 07/14/2016 with left-sided weakness and facial droop. Blood pressure elevated 170/106. Head CT scan negative. 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 CTA of the head showed no emergent large vessel occlusion or severe stenosis. Carotid Dopplers with no ICA stenosis. Patient did not receive TPA. Echocardiogram with ejection fraction of 60%. Normal systolic function. Placed on aspirin and Plavix for CVA prophylaxis. Subcutaneous Lovenox for DVT prophylaxis.   Patient transferred to CIR on 07/16/2016. Pt discharged form CIR on 08/03/16. Pt reports that he made progress at CIR. No reported problems since returning home. Currently using a wide base quad cane in RUE and L AFO. Pt reports that he has made the last progress with respect to his LUE strength.    Patient Stated Goals "Get back as close as I can to normal." Pt would to be able to play golf. Pt would like to return to some kind of work.         TREATMENT:  Modalities  NMES pulse width 380uS, Intensity 44, Rate: 50Hz , waveform: asymmetrical 8 minutes total time. 15 sec on/ 30 sec off against a 10 # weight with patient in sitting to improve motor recruitment. Active ankle Dorsiflexion performed throughout entirety of motion.   Therapeutic Exercise: Sit to stand with cueing on speed - 2 x 20  Toy soldiers in standing - x 20  Leg press calf raises single leg - 75# 2 x 10 Forward stepping lunges onto dynadisc - 2 x 10 Gastrocnemius stretch in stand - 30sec x 3 Side lunges onto dynadisc - x 20 B  TKE with single leg - x 30 with double black band      PT Education - 11/12/16 1412    Education provided Yes   Education Details Form and technique throughout exercise session   Person(s) Educated Patient   Methods Explanation;Demonstration   Comprehension Verbalized understanding;Returned demonstration             PT Long Term Goals -  10/30/16 0955      PT LONG TERM GOAL #1   Title Pt will be independent with HEP in order to improve strength and balance in order to decrease fall risk and improve function at home and work.   Baseline 10/30/16: Mod-min A for exericise progression/ performance   Time 6   Period Weeks   Status On-going     PT LONG TERM GOAL #2   Title Pt will improve BERG by at least 3 points in order to demonstrate clinically significant improvement in balance.   Baseline 08/07/16: 49/56 10/02/16: 53/56   Time 8   Period Weeks   Status Achieved     PT LONG TERM GOAL #3    Title Pt will decrease TUG to below 14 seconds in order to demonstrate decreased fall risk    Baseline 08/07/16: 21.9 seconds; 10/02/16: 12.3    Time 8   Period Weeks   Status Achieved     PT LONG TERM GOAL #4   Title Pt will decrease 5TSTS by at least 3 seconds in order to demonstrate clinically significant improvement in LE strength   Baseline 08/07/16: 18.5 seconds; 10/02/16: 14.3   Time 8   Period Weeks   Status Achieved     PT LONG TERM GOAL #5   Title Pt will increase 10MWT by at least 0.13 m/s in order to demonstrate clinically significant improvement in community ambulation   Baseline 08/07/16: 0.53 m/s 10/02/16: .97 m/s   Time 8   Period Weeks   Status Achieved     PT LONG TERM GOAL #6   Title Pt will be able to swing a golf club safely in order to complete 9 holes of golf   Baseline 10/30/16: Unable to swing golf club safely   Time 6   Period Weeks   Status On-going     PT LONG TERM GOAL #7   Title Pt will improve 6MWT by at least 157ft  to demonstrate improved aerobic capacity/endurance   Baseline 582 ft 10/02/16: 1138ft   Time 7   Period Weeks   Status Achieved     PT LONG TERM GOAL #8   Title Patient will improve DGI by 4 points to demonstrate improvement in dynamic gait/balance and decrease fall risk   Baseline 10/02/16: 19/24; 10/30/16: 21/24   Time 6   Period Weeks   Status On-going               Plan - 11/12/16 1415    Clinical Impression Statement Patient demonstrates increased fatigue with exercise most likely due to patient walking for ~ 83min before therapy session over inclined surfaces. Patient continues to demonstrate decreased strength and endurance and will benefit from further skilled therapy therapy to return to prior level of function.    Rehab Potential Good   Clinical Impairments Affecting Rehab Potential Positive: motivation, prior functional level; Negative: no active dorsiflexion, increased LUE tone   PT Frequency 2x / week   PT  Duration 6 weeks   PT Treatment/Interventions ADLs/Self Care Home Management;Biofeedback;Canalith Repostioning;Cryotherapy;Electrical Stimulation;Iontophoresis 4mg /ml Dexamethasone;Aquatic Therapy;Moist Heat;Traction;Ultrasound;Gait training;Stair training;Functional mobility training;Therapeutic activities;DME Instruction;Therapeutic exercise;Balance training;Neuromuscular re-education;Cognitive remediation;Patient/family education;Orthotic Fit/Training;Manual techniques;Passive range of motion;Energy conservation;Taping;Vestibular   PT Next Visit Plan Clamshell; DGI, LEFS, NME for L anterior tibialis, Walk-aide, progress balance, strengthening, and HEP; Consider sidelying hip abduction exercises   PT Home Exercise Plan Sit to stand, seated tband resisted clams, seated adduction squeeze   Consulted and Agree with Plan of Care Patient  Patient will benefit from skilled therapeutic intervention in order to improve the following deficits and impairments:  Abnormal gait, Decreased balance, Decreased coordination, Decreased mobility, Decreased range of motion, Decreased strength, Difficulty walking, Impaired tone, Impaired UE functional use  Visit Diagnosis: Muscle weakness (generalized)  Other lack of coordination  Unsteadiness on feet     Problem List Patient Active Problem List   Diagnosis Date Noted  . 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 Neurocognitive disorder   . Left foot drop   . Benign essential HTN   . Hemiparesis affecting nondominant side as late effect of cerebrovascular accident (CVA) ([place]) 07/18/2016  . Right basal ganglia embolic stroke ([place]) [id number]  . CVA (cerebral infarction) 07/15/2016  . TIA (transient ischemic attack) 07/14/2016  . HTN, goal below 140/80 07/14/2016  . Left facial numbness 07/14/2016  . Weakness of left side of body 07/14/2016    Blythe Stanford, PT DPT 11/12/2016, 2:29 PM  [place] MAIN [facility]  SERVICES [address] [place], [place], [address] Phone: [phone]   Fax:  [phone]  Name: Laydon Berteau MRN: [id number] Date of Birth: [birth year]

## 2016-11-13 ENCOUNTER — Ambulatory Visit: Payer: 59

## 2016-11-13 ENCOUNTER — Encounter: Payer: Self-pay | Admitting: Occupational Therapy

## 2016-11-13 ENCOUNTER — Encounter: Payer: 59 | Admitting: Occupational Therapy

## 2016-11-13 NOTE — Therapy (Signed)
[place] MAIN [facility] SERVICES [address] [place], [place], [address] Phone: [phone]   Fax:  [phone]  Occupational Therapy Treatment  Patient Details  Name: Jason Oconnor MRN: [id number] Date of Birth: 06-23-1956 No Data Recorded  Encounter Date: 11/12/2016      OT End of Session - 11/13/16 1450    Visit Number 26   Number of Visits 48   Date for OT Re-Evaluation 01/24/17   OT Start Time [id number]   OT Stop Time 1345   OT Time Calculation (min) 47 min   Activity Tolerance Patient tolerated treatment well   Behavior During Therapy [facility] for tasks assessed/performed      Past Medical History:  Diagnosis Date  . Bone marrow disease   . Hypertension     History reviewed. No pertinent surgical history.  There were no vitals filed for this visit.      Subjective Assessment - 11/13/16 1442    Subjective  Patient reports he is doing well, he was able to use fingernail clippers modified in the left hand to cut this right hand nails.  He reports it was difficult but was excited he was able to get it done.    Pertinent History Patient reports on July 13, 2016 he was helping a friend doing home repairs and felt tired, went home and started to notice problems moving around.  The next Oconnor he feel trying to put his slippers on, wife transported him to ER and [facility] for 3 days and then went to [facility] for REhab and was discharged this past Saturday to home.     Patient Stated Goals "I want to get back to close as normal as possible, play golf again, work in the garage."     Currently in Pain? No/denies   Pain Score 0-No pain                      OT Treatments/Exercises (OP) - 11/13/16 1444      Neurological Re-education Exercises   Other Exercises 1 Patient seen this date for LUE strengthening and ROM as follows:  UBE for 4 minutes total, forwards/backwards, alternating levels of resistance of 2.5 to 2.8 with therapist in  constant attendance to ensure grip and adjust settings.   Red theraband exercises in sitting for ABD/ADD, rows, elbow flexion/extension and push and pull exercises for 10 reps for 2 sets each.  Yellow theraputty for finger extension exercises with cues.  Wrist flexion/extension over blue foam roll with and without 1# weight with cues for form and technique.                  OT Education - 11/13/16 1450    Education provided Yes   Education Details HEP, strength, coordination, finger extension exercises.    Person(s) Educated Patient   Methods Explanation;Demonstration;Verbal cues   Comprehension Verbal cues required;Returned demonstration;Verbalized understanding             OT Long Term Goals - 11/01/16 [id number]      OT LONG TERM GOAL #1   Title Patient to improve left UE coordination sufficient to tie shoes with modified independence.    Baseline unable   Time 12   Period Weeks   Status New     OT LONG TERM GOAL #2   Title Patient to improve LUE coordination to complete buttons, snaps and zippers on clothing with modified independence.   Time 12   Period  Weeks   Status On-going     OT LONG TERM GOAL #3   Title Patient to complete bathing with modified independence including using left arm to wash right side of body.    Time 12   Period Weeks   Status New     OT LONG TERM GOAL #4   Title Patient to improve left hand use to type on computer using both hands on home row keys, 2 lines with no errors in 5 minutes.    Baseline unable   Time 12   Period Weeks   Status On-going     OT LONG TERM GOAL #5   Title Patient will improve grip on left hand by 10# to stabilize jars and containers for opening.    Baseline unable   Time 12   Period Weeks   Status On-going     Long Term Additional Goals   Additional Long Term Goals Yes     OT LONG TERM GOAL #6   Title Patient will be able to demonstrate sweeping with use of bilateral UEs with modified independence.     Baseline unable   Time 12   Period Weeks   Status On-going     OT LONG TERM GOAL #7   Title Patient will improve grip strength in left hand to be able to stabilize utensils to cut meat with modified independence.    Baseline unable    Time 12   Period Weeks   Status New     OT LONG TERM GOAL #8   Title Patient will be able to demonstrate increased strength in LUE to reach into microwave and retrieve plate of food with modified independence.   Baseline unable.    Time 12   Period Weeks   Status New     OT LONG TERM GOAL  #9   Baseline Patient will improve left UE control and reach to operate left turning signal in the car with modified independence.    Time 12   Period Weeks   Status New     OT LONG TERM GOAL  #10   TITLE Patient will be able to hold and manage large brush to wash his truck with modified independence.    Baseline unable   Time 12   Period Weeks   Status New               Plan - 11/13/16 1451    Clinical Impression Statement Patient progressing to more activities in sitting and standing this date, came in with regular cane today rather than quad cane.  He is now able to use nail clippers with a modified technique to clip his nails on his right hand.  Patient continues to require cues for exercises for proper form and technique to not use substituted movements at the left shoulder. Continue to work towards goals in [place] to improve independence in daily tasks.    Rehab Potential Good   OT Frequency 2x / week   OT Duration 12 weeks   OT Treatment/Interventions Self-care/ADL training;Therapeutic exercise;Neuromuscular education;Visual/perceptual remediation/compensation;Therapist, nutritional;Therapeutic exercises;Patient/family education;DME and/or AE instruction;Manual Therapy;Therapeutic activities;Balance training   Consulted and Agree with Plan of Care Patient      Patient will benefit from skilled therapeutic intervention in order to improve  the following deficits and impairments:  Decreased knowledge of use of DME, Decreased coordination, Impaired sensation, Decreased activity tolerance, Decreased range of motion, Decreased strength, Decreased balance, Difficulty walking, Impaired UE functional use  Visit  Diagnosis: Muscle weakness (generalized)  Other lack of coordination    Problem List Patient Active Problem List   Diagnosis Date Noted  . 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 Neurocognitive disorder   . Left foot drop   . Benign essential HTN   . Hemiparesis affecting nondominant side as late effect of cerebrovascular accident (CVA) ([place]) 07/18/2016  . Right basal ganglia embolic stroke ([place]) [id number]  . CVA (cerebral infarction) 07/15/2016  . TIA (transient ischemic attack) 07/14/2016  . HTN, goal below 140/80 07/14/2016  . Left facial numbness 07/14/2016  . Weakness of left side of body 07/14/2016   Phoenyx Melka Oneita Jolly, OTR/L, CLT  Sirenity Shew 11/13/2016, 2:52 PM  [place] MAIN [facility] SERVICES [address] [place], [place], [address] Phone: [phone]   Fax:  [phone]  Name: Argil Handley MRN: [id number] Date of Birth: 06/21/1956

## 2016-11-15 ENCOUNTER — Encounter: Payer: Self-pay | Admitting: Occupational Therapy

## 2016-11-15 ENCOUNTER — Ambulatory Visit: Payer: 59 | Admitting: Occupational Therapy

## 2016-11-15 ENCOUNTER — Ambulatory Visit: Payer: 59

## 2016-11-15 DIAGNOSIS — M6281 Muscle weakness (generalized): Secondary | ICD-10-CM | POA: Diagnosis not present

## 2016-11-15 DIAGNOSIS — R278 Other lack of coordination: Secondary | ICD-10-CM

## 2016-11-15 DIAGNOSIS — R2681 Unsteadiness on feet: Secondary | ICD-10-CM

## 2016-11-15 NOTE — Therapy (Signed)
[place] MAIN [facility] SERVICES [address] [place], [place], [address] Phone: [phone]   Fax:  [phone]  Physical Therapy Treatment  Patient Details  Name: Jason Oconnor MRN: [id number] Date of Birth: Sep 24, 1956 Referring Provider: Dr. Naaman Plummer  Encounter Date: 11/15/2016      PT End of Session - 11/15/16 0814    Visit Number 26   Number of Visits 37   Date for PT Re-Evaluation 12/11/16   Authorization Type no g codes   PT Start Time 0800   PT Stop Time 0845   PT Time Calculation (min) 45 min   Equipment Utilized During Treatment Gait belt   Activity Tolerance Patient tolerated treatment well   Behavior During Therapy [facility] for tasks assessed/performed      Past Medical History:  Diagnosis Date  . Bone marrow disease   . Hypertension     History reviewed. No pertinent surgical history.  There were no vitals filed for this visit.      Subjective Assessment - 11/15/16 0810    Subjective Patient reports he feels the UE is more affected than the LE. And that both exetremities are improving in terms of function.    Patient is accompained by: Family member  wife   Pertinent History Patient is a 60 year old right-handed male with history of hypertension. Per chart review patient is married. 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 Presented to [facility] 07/14/2016 with left-sided weakness and facial droop. Blood pressure elevated 170/106. Head CT scan negative. 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 CTA of the head showed no emergent large vessel occlusion or severe stenosis. Carotid Dopplers with no ICA stenosis. Patient did not receive TPA. Echocardiogram with ejection fraction of 60%. Normal systolic function. Placed on aspirin and Plavix for CVA  prophylaxis. Subcutaneous Lovenox for DVT prophylaxis.  Patient transferred to CIR on 07/16/2016. Pt discharged form CIR on 08/03/16. Pt reports that he made progress at CIR. No reported problems since returning home. Currently using a wide base quad cane in RUE and L AFO. Pt reports that he has made the last progress with respect to his LUE strength.    Patient Stated Goals "Get back as close as I can to normal." Pt would to be able to play golf. Pt would like to return to some kind of work.    Currently in Pain? No/denies      TREATMENT:  Modalities  NMES pulse width 380uS, Intensity 44, Rate: 50Hz , waveform: asymmetrical 8 minutes total time. 15 sec on/ 30 sec off against a 10 # weight with patient in sitting to improve motor recruitment. Active ankle Dorsiflexion performed throughout entirety of motion.   Therapeutic Exercise: Sit to stand with cueing on speed - 2 x 15 Seated ball roll outs with 4# -- 2 x 20  Stool scoots - x 100 ft Stepping lunges onto Bosu ball from airex - x25 Resisted walk outs at MATRIX - 12.5# x3  facing left; x5 facing left Toy soldiers in standing on airex - x 20  Standing hip abduction on airex - 2 x 20 Gastrocnemius stretch in stand - 30sec x 3 TKE with single leg - x 30 with double black band Calf raises from blue foam - x 20         PT Education - 11/15/16 [id number]  Education provided Yes   Education Details form and tehcnique throughout exercise session   Person(s) Educated Patient   Methods Explanation;Demonstration;Verbal cues   Comprehension Verbalized understanding;Returned demonstration             PT Long Term Goals - 10/30/16 0955      PT LONG TERM GOAL #1   Title Pt will be independent with HEP in order to improve strength and balance in order to decrease fall risk and improve function at home and work.   Baseline 10/30/16: Mod-min A for exericise progression/ performance   Time 6   Period Weeks   Status On-going     PT LONG TERM  GOAL #2   Title Pt will improve BERG by at least 3 points in order to demonstrate clinically significant improvement in balance.   Baseline 08/07/16: 49/56 10/02/16: 53/56   Time 8   Period Weeks   Status Achieved     PT LONG TERM GOAL #3   Title Pt will decrease TUG to below 14 seconds in order to demonstrate decreased fall risk    Baseline 08/07/16: 21.9 seconds; 10/02/16: 12.3    Time 8   Period Weeks   Status Achieved     PT LONG TERM GOAL #4   Title Pt will decrease 5TSTS by at least 3 seconds in order to demonstrate clinically significant improvement in LE strength   Baseline 08/07/16: 18.5 seconds; 10/02/16: 14.3   Time 8   Period Weeks   Status Achieved     PT LONG TERM GOAL #5   Title Pt will increase 10MWT by at least 0.13 m/s in order to demonstrate clinically significant improvement in community ambulation   Baseline 08/07/16: 0.53 m/s 10/02/16: .97 m/s   Time 8   Period Weeks   Status Achieved     PT LONG TERM GOAL #6   Title Pt will be able to swing a golf club safely in order to complete 9 holes of golf   Baseline 10/30/16: Unable to swing golf club safely   Time 6   Period Weeks   Status On-going     PT LONG TERM GOAL #7   Title Pt will improve 6MWT by at least 140ft  to demonstrate improved aerobic capacity/endurance   Baseline 582 ft 10/02/16: 1178ft   Time 7   Period Weeks   Status Achieved     PT LONG TERM GOAL #8   Title Patient will improve DGI by 4 points to demonstrate improvement in dynamic gait/balance and decrease fall risk   Baseline 10/02/16: 19/24; 10/30/16: 21/24   Time 6   Period Weeks   Status On-going               Plan - 11/15/16 0836    Clinical Impression Statement Patient demonstrates increased fasiculations within the quad musculature which increase at end of therapy session indicating decreased muscular strength and endurance. Patient will benefit from further skilled therapy to return to prior level of function.    Rehab  Potential Good   Clinical Impairments Affecting Rehab Potential Positive: motivation, prior functional level; Negative: no active dorsiflexion, increased LUE tone   PT Frequency 2x / week   PT Duration 6 weeks   PT Treatment/Interventions ADLs/Self Care Home Management;Biofeedback;Canalith Repostioning;Cryotherapy;Electrical Stimulation;Iontophoresis 4mg /ml Dexamethasone;Aquatic Therapy;Moist Heat;Traction;Ultrasound;Gait training;Stair training;Functional mobility training;Therapeutic activities;DME Instruction;Therapeutic exercise;Balance training;Neuromuscular re-education;Cognitive remediation;Patient/family education;Orthotic Fit/Training;Manual techniques;Passive range of motion;Energy conservation;Taping;Vestibular   PT Next Visit Plan Clamshell; DGI, LEFS, NME for L anterior tibialis, Walk-aide,  progress balance, strengthening, and HEP; Consider sidelying hip abduction exercises   PT Home Exercise Plan Sit to stand, seated tband resisted clams, seated adduction squeeze   Consulted and Agree with Plan of Care Patient      Patient will benefit from skilled therapeutic intervention in order to improve the following deficits and impairments:  Abnormal gait, Decreased balance, Decreased coordination, Decreased mobility, Decreased range of motion, Decreased strength, Difficulty walking, Impaired tone, Impaired UE functional use  Visit Diagnosis: Muscle weakness (generalized)  Other lack of coordination  Unsteadiness on feet     Problem List Patient Active Problem List   Diagnosis Date Noted  . 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 Neurocognitive disorder   . Left foot drop   . Benign essential HTN   . Hemiparesis affecting nondominant side as late effect of cerebrovascular accident (CVA) ([place]) 07/18/2016  . Right basal ganglia embolic stroke ([place]) [id number]  . CVA (cerebral infarction) 07/15/2016  . TIA (transient ischemic attack) 07/14/2016  . HTN, goal below 140/80  07/14/2016  . Left facial numbness 07/14/2016  . Weakness of left side of body 07/14/2016    Blythe Stanford, PT DPT 11/15/2016, 8:54 AM  [place] MAIN [facility] SERVICES [address] [place], [place], [address] Phone: [phone]   Fax:  [phone]  Name: Arvin Raub MRN: [id number] Date of Birth: 22-Jun-1956

## 2016-11-15 NOTE — Therapy (Signed)
[place] MAIN [facility] SERVICES [address] [place], [place], [address] Phone: [phone]   Fax:  [phone]  Occupational Therapy Treatment  Patient Details  Name: Jason Oconnor MRN: [id number] Date of Birth: 1956-03-07 No Data Recorded  Encounter Date: 11/15/2016      OT End of Session - 11/15/16 1242    Visit Number 27   Number of Visits 40   Date for OT Re-Evaluation 01/24/17   OT Start Time 0845   OT Stop Time 0933   OT Time Calculation (min) 48 min   Activity Tolerance Patient tolerated treatment well   Behavior During Therapy [facility] for tasks assessed/performed      Past Medical History:  Diagnosis Date  . Bone marrow disease   . Hypertension     History reviewed. No pertinent surgical history.  There were no vitals filed for this visit.      Subjective Assessment - 11/15/16 1233    Subjective  Patient reports he has been taking the baclofen only at night, he says his wife is concerned about him becoming dependent on it and does not want him to take it during the day.     Pertinent History Patient reports on July 13, 2016 he was helping a friend doing home repairs and felt tired, went home and started to notice problems moving around.  The next morning he feel trying to put his slippers on, wife transported him to ER and [facility] for 3 days and then went to [facility] for REhab and was discharged this past Saturday to home.     Patient Stated Goals "I want to get back to close as normal as possible, play golf again, work in the garage."     Currently in Pain? No/denies   Pain Score 0-No pain                      OT Treatments/Exercises (OP) - 11/15/16 1235      Fine Motor Coordination   Other Fine Motor Exercises Manipulation of [place] discs from tabletop with left hand, cues for turning, flipping objects and use of fingers for isolated movements.  Manipulation of coins from tabletop to pick up and place into  resistive bank, has difficulty picking up from the table and often has to slide coins to the edge for manipulation.      Neurological Re-education Exercises   Other Exercises 1 Patient seen this date for UBE for 5 minutes total, 2 minutes forwards, 2 minutes backwards with resistance of 2.6 to 2.8, then remaining minute at 1.8 resistance with use of left hand only, therapist in constant attendance to ensure grip and adjust setting as needed. Patient seen for saebo ball toss in standing with toss into crate on the floor for 3 rounds of 15 reps, cues for left UE for technique and decrease shoulder "hiking".  Patient able to retrieve balls from the floor with min guard.  Medium ball toss to wall with reactive rebound for 15 reps for 2 sets then advanced to ball bounce then toss for 15 reps for 1 set.  Gross motor coordination with ball dribbling, difficulty with finger extension and therefore the ball tended to bounce close to body rather than away.  Verbal cues and therapist assist to retrieve misguided balls.                 OT Education - 11/15/16 1242    Education provided Yes  Education Details manipulation skills, ball exercises    Person(s) Educated Patient   Methods Explanation;Demonstration;Verbal cues   Comprehension Verbal cues required;Returned demonstration;Verbalized understanding             OT Long Term Goals - 11/01/16 [id number]      OT LONG TERM GOAL #1   Title Patient to improve left UE coordination sufficient to tie shoes with modified independence.    Baseline unable   Time 12   Period Weeks   Status New     OT LONG TERM GOAL #2   Title Patient to improve LUE coordination to complete buttons, snaps and zippers on clothing with modified independence.   Time 12   Period Weeks   Status On-going     OT LONG TERM GOAL #3   Title Patient to complete bathing with modified independence including using left arm to wash right side of body.    Time 12   Period Weeks    Status New     OT LONG TERM GOAL #4   Title Patient to improve left hand use to type on computer using both hands on home row keys, 2 lines with no errors in 5 minutes.    Baseline unable   Time 12   Period Weeks   Status On-going     OT LONG TERM GOAL #5   Title Patient will improve grip on left hand by 10# to stabilize jars and containers for opening.    Baseline unable   Time 12   Period Weeks   Status On-going     Long Term Additional Goals   Additional Long Term Goals Yes     OT LONG TERM GOAL #6   Title Patient will be able to demonstrate sweeping with use of bilateral UEs with modified independence.    Baseline unable   Time 12   Period Weeks   Status On-going     OT LONG TERM GOAL #7   Title Patient will improve grip strength in left hand to be able to stabilize utensils to cut meat with modified independence.    Baseline unable    Time 12   Period Weeks   Status New     OT LONG TERM GOAL #8   Title Patient will be able to demonstrate increased strength in LUE to reach into microwave and retrieve plate of food with modified independence.   Baseline unable.    Time 12   Period Weeks   Status New     OT LONG TERM GOAL  #9   Baseline Patient will improve left UE control and reach to operate left turning signal in the car with modified independence.    Time 12   Period Weeks   Status New     OT LONG TERM GOAL  #10   TITLE Patient will be able to hold and manage large brush to wash his truck with modified independence.    Baseline unable   Time 12   Period Weeks   Status New               Plan - 11/15/16 1242    Clinical Impression Statement Patient demos difficulty with gross motor coordination of the LUE with ball toss, catch and rebound of ball from the wall toss.  Decreased reaction times noted with LUE and benefits from tasks such as ball dribbling and wall toss catch for reactive responses. He still hasn't gotten a ball for home but is  thinking it would be good as a part of his home program.  Recommended patient call his doctor about the use of baclofen since he is not taking it as directed and reports his wife is concerned it may have some addictive properties and is concerned about him taking when driving.  Patient feels he needs it during the day but has not tried taking it except at night although the directions are for 3 times a day.    Rehab Potential Good   OT Frequency 2x / week   OT Duration 12 weeks   OT Treatment/Interventions Self-care/ADL training;Therapeutic exercise;Neuromuscular education;Visual/perceptual remediation/compensation;Therapist, nutritional;Therapeutic exercises;Patient/family education;DME and/or AE instruction;Manual Therapy;Therapeutic activities;Balance training   Consulted and Agree with Plan of Care Patient      Patient will benefit from skilled therapeutic intervention in order to improve the following deficits and impairments:  Decreased knowledge of use of DME, Decreased coordination, Impaired sensation, Decreased activity tolerance, Decreased range of motion, Decreased strength, Decreased balance, Difficulty walking, Impaired UE functional use  Visit Diagnosis: Muscle weakness (generalized)  Other lack of coordination    Problem List Patient Active Problem List   Diagnosis Date Noted  . 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 Neurocognitive disorder   . Left foot drop   . Benign essential HTN   . Hemiparesis affecting nondominant side as late effect of cerebrovascular accident (CVA) ([place]) 07/18/2016  . Right basal ganglia embolic stroke ([place]) 123XX123  . CVA (cerebral infarction) 07/15/2016  . TIA (transient ischemic attack) 07/14/2016  . HTN, goal below 140/80 07/14/2016  . Left facial numbness 07/14/2016  . Weakness of left side of body 07/14/2016   [name] Oneita Jolly, OTR/L, CLT  [name],[name] 11/15/2016, 12:47 PM  [place] MAIN  [facility] SERVICES [address] [place], [place], [address] Phone: [phone]   Fax:  [phone]  Name: Jason Oconnor MRN: [id number] Date of Birth: [birth year]

## 2016-11-20 ENCOUNTER — Ambulatory Visit: Payer: 59 | Admitting: Occupational Therapy

## 2016-11-20 ENCOUNTER — Encounter: Payer: Self-pay | Admitting: Occupational Therapy

## 2016-11-20 ENCOUNTER — Ambulatory Visit: Payer: 59

## 2016-11-20 DIAGNOSIS — R278 Other lack of coordination: Secondary | ICD-10-CM

## 2016-11-20 DIAGNOSIS — R2681 Unsteadiness on feet: Secondary | ICD-10-CM

## 2016-11-20 DIAGNOSIS — M6281 Muscle weakness (generalized): Secondary | ICD-10-CM

## 2016-11-20 NOTE — Therapy (Signed)
[place] MAIN [facility] SERVICES [address] [place], [place], [address] Phone: [phone]   Fax:  [phone]  Physical Therapy Treatment  Patient Details  Name: Jason Oconnor MRN: [id number] Date of Birth: [birth year] Referring Provider: Dr. Naaman Plummer  Encounter Date: 11/20/2016      PT End of Session - 11/20/16 0808    Visit Number 27   Number of Visits 37   Date for PT Re-Evaluation 12/11/16   Authorization Type no g codes   PT Start Time 0800   PT Stop Time 0845   PT Time Calculation (min) 45 min   Equipment Utilized During Treatment Gait belt   Activity Tolerance Patient tolerated treatment well   Behavior During Therapy [facility] for tasks assessed/performed      Past Medical History:  Diagnosis Date  . Bone marrow disease   . Hypertension     History reviewed. No pertinent surgical history.  There were no vitals filed for this visit.      Subjective Assessment - 11/20/16 0803    Subjective Patient reports he forgot his cane over the holiday and reports he's getting stronger.    Patient is accompained by: Family member  wife   Pertinent History Patient is a 60 year old right-handed male with history of hypertension. Per chart review patient is married. 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 Presented to [facility] 07/14/2016 with left-sided weakness and facial droop. Blood pressure elevated 170/106. Head CT scan negative. 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 CTA of the head showed no emergent large vessel occlusion or severe stenosis. Carotid Dopplers with no ICA stenosis. Patient did not receive TPA. Echocardiogram with ejection fraction of 60%. Normal systolic function. Placed on aspirin and Plavix for CVA prophylaxis. Subcutaneous Lovenox for DVT  prophylaxis.  Patient transferred to CIR on 07/16/2016. Pt discharged form CIR on 08/03/16. Pt reports that he made progress at CIR. No reported problems since returning home. Currently using a wide base quad cane in RUE and L AFO. Pt reports that he has made the last progress with respect to his LUE strength.    Patient Stated Goals "Get back as close as I can to normal." Pt would to be able to play golf. Pt would like to return to some kind of work.    Currently in Pain? No/denies      TREATMENT:  Modalities  NMES pulse width 380uS, Intensity 44, Rate: 50Hz , waveform: asymmetrical 8 minutes total time. 15 sec on/ 30 sec off against a 10 # weight with patient in sitting to improve motor recruitment. Active ankle Dorsiflexion performed throughout entirety of motion.   Therapeutic Exercise: Seated hamstring curl with GTB - 2 x 20  Sit to stand with cueing on speed - 2 x 20 Calf raises on leg press machine -  3 x 20 75# [place] dead lift single leg - 2 x 20 #8 Stepping lunges onto dynadisc - x25 Gastrocnemius stretch in stand - 30sec x 3 TKE with single leg - 2 x 30 with double black band Standing hamstring curls on L LE - x 10 with 2 sec hold      PT Education - 11/20/16 0807    Education provided Yes   Education Details improving toe off during gait   Person(s) Educated Patient   Methods Demonstration;Explanation  Comprehension Verbalized understanding;Returned demonstration             PT Long Term Goals - 10/30/16 0955      PT LONG TERM GOAL #1   Title Pt will be independent with HEP in order to improve strength and balance in order to decrease fall risk and improve function at home and work.   Baseline 10/30/16: Mod-min A for exericise progression/ performance   Time 6   Period Weeks   Status On-going     PT LONG TERM GOAL #2   Title Pt will improve BERG by at least 3 points in order to demonstrate clinically significant improvement in balance.   Baseline 08/07/16:  49/56 10/02/16: 53/56   Time 8   Period Weeks   Status Achieved     PT LONG TERM GOAL #3   Title Pt will decrease TUG to below 14 seconds in order to demonstrate decreased fall risk    Baseline 08/07/16: 21.9 seconds; 10/02/16: 12.3    Time 8   Period Weeks   Status Achieved     PT LONG TERM GOAL #4   Title Pt will decrease 5TSTS by at least 3 seconds in order to demonstrate clinically significant improvement in LE strength   Baseline 08/07/16: 18.5 seconds; 10/02/16: 14.3   Time 8   Period Weeks   Status Achieved     PT LONG TERM GOAL #5   Title Pt will increase 10MWT by at least 0.13 m/s in order to demonstrate clinically significant improvement in community ambulation   Baseline 08/07/16: 0.53 m/s 10/02/16: .97 m/s   Time 8   Period Weeks   Status Achieved     PT LONG TERM GOAL #6   Title Pt will be able to swing a golf club safely in order to complete 9 holes of golf   Baseline 10/30/16: Unable to swing golf club safely   Time 6   Period Weeks   Status On-going     PT LONG TERM GOAL #7   Title Pt will improve 6MWT by at least 120ft  to demonstrate improved aerobic capacity/endurance   Baseline 582 ft 10/02/16: 11101ft   Time 7   Period Weeks   Status Achieved     PT LONG TERM GOAL #8   Title Patient will improve DGI by 4 points to demonstrate improvement in dynamic gait/balance and decrease fall risk   Baseline 10/02/16: 19/24; 10/30/16: 21/24   Time 6   Period Weeks   Status On-going               Plan - 11/20/16 0835    Clinical Impression Statement Focused on improving hamstring muscular strength to tolerance as patient's L LE fatigues quickly indicating decreased muscular endurance. Although patient is improving, he continues to demonstrate decreased strength and control of the LE's and will benefit from further skilled therapy to return to prior level of function.    Rehab Potential Good   Clinical Impairments Affecting Rehab Potential Positive: motivation,  prior functional level; Negative: no active dorsiflexion, increased LUE tone   PT Frequency 2x / week   PT Duration 6 weeks   PT Treatment/Interventions ADLs/Self Care Home Management;Biofeedback;Canalith Repostioning;Cryotherapy;Electrical Stimulation;Iontophoresis 4mg /ml Dexamethasone;Aquatic Therapy;Moist Heat;Traction;Ultrasound;Gait training;Stair training;Functional mobility training;Therapeutic activities;DME Instruction;Therapeutic exercise;Balance training;Neuromuscular re-education;Cognitive remediation;Patient/family education;Orthotic Fit/Training;Manual techniques;Passive range of motion;Energy conservation;Taping;Vestibular   PT Next Visit Plan [place]; DGI, LEFS, NME for L anterior tibialis, Walk-aide, progress balance, strengthening, and HEP; Consider sidelying hip abduction exercises   PT  Home Exercise Plan Sit to stand, seated tband resisted clams, seated adduction squeeze   Consulted and Agree with Plan of Care Patient      Patient will benefit from skilled therapeutic intervention in order to improve the following deficits and impairments:  Abnormal gait, Decreased balance, Decreased coordination, Decreased mobility, Decreased range of motion, Decreased strength, Difficulty walking, Impaired tone, Impaired UE functional use  Visit Diagnosis: Muscle weakness (generalized)  Other lack of coordination  Unsteadiness on feet     Problem List Patient Active Problem List   Diagnosis Date Noted  . 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 Neurocognitive disorder   . Left foot drop   . Benign essential HTN   . Hemiparesis affecting nondominant side as late effect of cerebrovascular accident (CVA) ([place]) 07/18/2016  . Right basal ganglia embolic stroke ([place]) 123XX123  . CVA (cerebral infarction) 07/15/2016  . TIA (transient ischemic attack) 07/14/2016  . HTN, goal below 140/80 07/14/2016  . Left facial numbness 07/14/2016  . Weakness of left side of body 07/14/2016     Blythe Stanford, PT DPT 11/20/2016, 8:46 AM  [place] MAIN [facility] SERVICES [address] [place], [place], [address] Phone: [phone]   Fax:  [phone]  Name: Jason Oconnor MRN: [id number] Date of Birth: 1956-01-08

## 2016-11-21 NOTE — Therapy (Signed)
[place] MAIN [facility] SERVICES [address] [place], [place], [address] Phone: [phone]   Fax:  [phone]  Occupational Therapy Treatment  Patient Details  Name: Jason Oconnor MRN: [id number] Date of Birth: [birth year] No Data Recorded  Encounter Date: 11/20/2016      OT End of Session - 11/21/16 2133    Visit Number 28   Number of Visits 48   Date for OT Re-Evaluation 01/24/17   OT Start Time 0845   OT Stop Time 0935   OT Time Calculation (min) 50 min   Activity Tolerance Patient tolerated treatment well   Behavior During Therapy [facility] for tasks assessed/performed      Past Medical History:  Diagnosis Date  . Bone marrow disease   . Hypertension     History reviewed. No pertinent surgical history.  There were no vitals filed for this visit.      Subjective Assessment - 11/21/16 2132    Subjective  Patient reports he is still only taking the baclofen one time a day, continue to recommend patient call MD and discuss if he is concerned or if his wife is concerned about him taking it 3 times a day as prescribed.  Discussed the fact that his medication may not be as effective if he is not taking it as directed.    Pertinent History Patient reports on July 13, 2016 he was helping a friend doing home repairs and felt tired, went home and started to notice problems moving around.  The next morning he feel trying to put his slippers on, wife transported him to ER and [facility] for 3 days and then went to [facility] for REhab and was discharged this past Saturday to home.     Patient Stated Goals "I want to get back to close as normal as possible, play golf again, work in the garage."     Currently in Pain? No/denies   Pain Score 0-No pain                      OT Treatments/Exercises (OP) - 11/21/16 2136      Fine Motor Coordination   Other Fine Motor Exercises Manipulation of checkers from tabletop, placing into vertical  grid, elevated surface, cues for prehension patterns, instructions on moving object from fingertip to palm and back as well as stacking objects.  Patient has difficulty with using the hand for storage and drops items frequently out of the ulnar side of the hand.      Neurological Re-education Exercises   Other Exercises 1 Patient seen for exercises in standing at tower for triceps press 7.5# for 15 reps, 12.5# for 10 reps with cues for technique, biceps curl with 2.5# for 15 reps, 7.5# for 10 reps.  Rowing with 12.5#, shoulder retraction with rope for 10 reps for 3 sets.                  OT Education - 11/21/16 2132    Education provided Yes   Education Details manipulation of checkers in a variety of ways for home  program, using the hand for storage   Person(s) Educated Patient   Methods Explanation;Demonstration;Tactile cues   Comprehension Verbalized understanding;Returned demonstration;Verbal cues required             OT Long Term Goals - 11/01/16 [id number]      OT LONG TERM GOAL #1   Title Patient to improve left UE coordination sufficient to  tie shoes with modified independence.    Baseline unable   Time 12   Period Weeks   Status New     OT LONG TERM GOAL #2   Title Patient to improve LUE coordination to complete buttons, snaps and zippers on clothing with modified independence.   Time 12   Period Weeks   Status On-going     OT LONG TERM GOAL #3   Title Patient to complete bathing with modified independence including using left arm to wash right side of body.    Time 12   Period Weeks   Status New     OT LONG TERM GOAL #4   Title Patient to improve left hand use to type on computer using both hands on home row keys, 2 lines with no errors in 5 minutes.    Baseline unable   Time 12   Period Weeks   Status On-going     OT LONG TERM GOAL #5   Title Patient will improve grip on left hand by 10# to stabilize jars and containers for opening.    Baseline unable    Time 12   Period Weeks   Status On-going     Long Term Additional Goals   Additional Long Term Goals Yes     OT LONG TERM GOAL #6   Title Patient will be able to demonstrate sweeping with use of bilateral UEs with modified independence.    Baseline unable   Time 12   Period Weeks   Status On-going     OT LONG TERM GOAL #7   Title Patient will improve grip strength in left hand to be able to stabilize utensils to cut meat with modified independence.    Baseline unable    Time 12   Period Weeks   Status New     OT LONG TERM GOAL #8   Title Patient will be able to demonstrate increased strength in LUE to reach into microwave and retrieve plate of food with modified independence.   Baseline unable.    Time 12   Period Weeks   Status New     OT LONG TERM GOAL  #9   Baseline Patient will improve left UE control and reach to operate left turning signal in the car with modified independence.    Time 12   Period Weeks   Status New     OT LONG TERM GOAL  #10   TITLE Patient will be able to hold and manage large brush to wash his truck with modified independence.    Baseline unable   Time 12   Period Weeks   Status New               Plan - 11/21/16 2133    Clinical Impression Statement Patient progressing with exercises on tower , increasing in weight for resistance for LUE strengthening.  He continues to work towards fine motor coordination and manipulation skills, working on combined movements with checkers this date and reaching to place in a variety of planes.  Difficulty with using the hand for storage. Continue to work towards LUE ROM, strength and coordination skills to improve independence in daily tasks.    Rehab Potential Good   OT Frequency 2x / week   OT Duration 12 weeks   Consulted and Agree with Plan of Care Patient      Patient will benefit from skilled therapeutic intervention in order to improve the following deficits and impairments:  Decreased  knowledge of use of DME, Decreased coordination, Impaired sensation, Decreased activity tolerance, Decreased range of motion, Decreased strength, Decreased balance, Difficulty walking, Impaired UE functional use  Visit Diagnosis: Muscle weakness (generalized)  Other lack of coordination    Problem List Patient Active Problem List   Diagnosis Date Noted  . 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 Neurocognitive disorder   . Left foot drop   . Benign essential HTN   . Hemiparesis affecting nondominant side as late effect of cerebrovascular accident (CVA) ([place]) 07/18/2016  . Right basal ganglia embolic stroke ([place]) [id number]  . CVA (cerebral infarction) 07/15/2016  . TIA (transient ischemic attack) 07/14/2016  . HTN, goal below 140/80 07/14/2016  . Left facial numbness 07/14/2016  . Weakness of left side of body 07/14/2016   Cheyane Ayon Oneita Jolly, OTR/L, CLT  Mahathi Pokorney 11/21/2016, 9:41 PM  [place] MAIN [facility] SERVICES [address] [place], [place], [address] Phone: [phone]   Fax:  [phone]  Name: Doren Apostle MRN: [id number] Date of Birth: Nov 11, 1956

## 2016-11-22 ENCOUNTER — Ambulatory Visit: Payer: 59

## 2016-11-22 ENCOUNTER — Ambulatory Visit: Payer: 59 | Admitting: Occupational Therapy

## 2016-11-22 DIAGNOSIS — M6281 Muscle weakness (generalized): Secondary | ICD-10-CM | POA: Diagnosis not present

## 2016-11-22 DIAGNOSIS — R278 Other lack of coordination: Secondary | ICD-10-CM

## 2016-11-22 DIAGNOSIS — R2681 Unsteadiness on feet: Secondary | ICD-10-CM

## 2016-11-22 NOTE — Therapy (Signed)
[place] MAIN [facility] SERVICES [address] [place], [place], [address] Phone: [phone]   Fax:  [phone]  Physical Therapy Treatment  Patient Details  Name: Jason Oconnor MRN: [id number] Date of Birth: Mar 23, 1956 Referring Provider: Dr. Naaman Plummer  Encounter Date: 11/22/2016      PT End of Session - 11/22/16 0819    Visit Number 28   Number of Visits 37   Date for PT Re-Evaluation 12/11/16   Authorization Type no g codes   PT Start Time 0800   PT Stop Time 0845   PT Time Calculation (min) 45 min   Equipment Utilized During Treatment Gait belt   Activity Tolerance Patient tolerated treatment well   Behavior During Therapy [facility] for tasks assessed/performed      Past Medical History:  Diagnosis Date  . Bone marrow disease   . Hypertension     History reviewed. No pertinent surgical history.  There were no vitals filed for this visit.      Subjective Assessment - 11/22/16 0808    Subjective Patient reports he has some days he feels stronger than others. Patient states he's still getting stronger.   Patient is accompained by: Family member  wife   Pertinent History Patient is a 60 year old right-handed male with history of hypertension. Per chart review patient is married. 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 Presented to [facility] 07/14/2016 with left-sided weakness and facial droop. Blood pressure elevated 170/106. Head CT scan negative. 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 CTA of the head showed no emergent large vessel occlusion or severe stenosis. Carotid Dopplers with no ICA stenosis. Patient did not receive TPA. Echocardiogram with ejection fraction of 60%. Normal systolic function. Placed on aspirin and Plavix for CVA prophylaxis.  Subcutaneous Lovenox for DVT prophylaxis.  Patient transferred to [facility] on 07/16/2016. Pt discharged form [facility] on 08/03/16. Pt reports that he made progress at CIR. No reported problems since returning home. Currently using a wide base quad cane in RUE and L AFO. Pt reports that he has made the last progress with respect to his LUE strength.    Patient Stated Goals "Get back as close as I can to normal." Pt would to be able to play golf. Pt would like to return to some kind of work.    Currently in Pain? No/denies        TREATMENT:  Modalities  NMES pulse width 380uS, Intensity 44, Rate: 50Hz , waveform: asymmetrical 8 minutes total time. 15 sec on/ 30 sec off against a 10 # weight with patient in sitting to improve motor recruitment. Active ankle Dorsiflexion performed throughout entirety of motion.   Therapeutic Exercise: Seated ball roll outs -- 2 x 20 4# Sit to stand with cueing on speed - 2 x 25 TKE with single leg - 2 x 30 with double black band Resisted side stepping at [employer] - x 3 both ways 15# Side stepping up and over Bosu - x12 B Stepping forward up and over bosu ball on airex pad - x 15 forward/backward       PT Education - 11/22/16 0813    Education provided Yes   Education Details form/technique throughout treatment session   Person(s) Educated Patient   Methods Explanation;Demonstration   Comprehension Verbalized understanding;Returned demonstration  PT Long Term Goals - 10/30/16 0955      PT LONG TERM GOAL #1   Title Pt will be independent with HEP in order to improve strength and balance in order to decrease fall risk and improve function at home and work.   Baseline 10/30/16: Mod-min A for exericise progression/ performance   Time 6   Period Weeks   Status On-going     PT LONG TERM GOAL #2   Title Pt will improve BERG by at least 3 points in order to demonstrate clinically significant improvement in balance.   Baseline 08/07/16: 49/56 10/02/16: 53/56    Time 8   Period Weeks   Status Achieved     PT LONG TERM GOAL #3   Title Pt will decrease TUG to below 14 seconds in order to demonstrate decreased fall risk    Baseline 08/07/16: 21.9 seconds; 10/02/16: 12.3    Time 8   Period Weeks   Status Achieved     PT LONG TERM GOAL #4   Title Pt will decrease 5TSTS by at least 3 seconds in order to demonstrate clinically significant improvement in LE strength   Baseline 08/07/16: 18.5 seconds; 10/02/16: 14.3   Time 8   Period Weeks   Status Achieved     PT LONG TERM GOAL #5   Title Pt will increase 10MWT by at least 0.13 m/s in order to demonstrate clinically significant improvement in community ambulation   Baseline 08/07/16: 0.53 m/s 10/02/16: .97 m/s   Time 8   Period Weeks   Status Achieved     PT LONG TERM GOAL #6   Title Pt will be able to swing a golf club safely in order to complete 9 holes of golf   Baseline 10/30/16: Unable to swing golf club safely   Time 6   Period Weeks   Status On-going     PT LONG TERM GOAL #7   Title Pt will improve 6MWT by at least 150ft  to demonstrate improved aerobic capacity/endurance   Baseline 582 ft 10/02/16: 1171ft   Time 7   Period Weeks   Status Achieved     PT LONG TERM GOAL #8   Title Patient will improve DGI by 4 points to demonstrate improvement in dynamic gait/balance and decrease fall risk   Baseline 10/02/16: 19/24; 10/30/16: 21/24   Time 6   Period Weeks   Status On-going               Plan - 11/22/16 [id number]    Clinical Impression Statement Continued to focus on improving LE and hip strength to improve ease with walking. Patient demonstrates ability to perform greater sit to stand exercises today versus previous visits indicating funcitonal carryover between visits. Altough patient is improving he continues to demonstrate weakness and decreased motor control with exercise and will benefit from further skilled therapy to return to prior level of function.    Rehab Potential  Good   Clinical Impairments Affecting Rehab Potential Positive: motivation, prior functional level; Negative: no active dorsiflexion, increased LUE tone   PT Frequency 2x / week   PT Duration 6 weeks   PT Treatment/Interventions ADLs/Self Care Home Management;Biofeedback;Canalith Repostioning;Cryotherapy;Electrical Stimulation;Iontophoresis 4mg /ml Dexamethasone;Aquatic Therapy;Moist Heat;Traction;Ultrasound;Gait training;Stair training;Functional mobility training;Therapeutic activities;DME Instruction;Therapeutic exercise;Balance training;Neuromuscular re-education;Cognitive remediation;Patient/family education;Orthotic Fit/Training;Manual techniques;Passive range of motion;Energy conservation;Taping;Vestibular   PT Next Visit Plan Clamshell; DGI, LEFS, NME for L anterior tibialis, Walk-aide, progress balance, strengthening, and HEP; Consider sidelying hip abduction exercises   PT Home  Exercise Plan Sit to stand, seated tband resisted clams, seated adduction squeeze   Consulted and Agree with Plan of Care Patient      Patient will benefit from skilled therapeutic intervention in order to improve the following deficits and impairments:  Abnormal gait, Decreased balance, Decreased coordination, Decreased mobility, Decreased range of motion, Decreased strength, Difficulty walking, Impaired tone, Impaired UE functional use  Visit Diagnosis: Muscle weakness (generalized)  Other lack of coordination  Unsteadiness on feet     Problem List Patient Active Problem List   Diagnosis Date Noted  . 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 Neurocognitive disorder   . Left foot drop   . Benign essential HTN   . Hemiparesis affecting nondominant side as late effect of cerebrovascular accident (CVA) ([place]) 07/18/2016  . Right basal ganglia embolic stroke ([place]) [id number]  . CVA (cerebral infarction) 07/15/2016  . TIA (transient ischemic attack) 07/14/2016  . HTN, goal below 140/80 07/14/2016  .  Left facial numbness 07/14/2016  . Weakness of left side of body 07/14/2016    Blythe Stanford, PT DPT 11/22/2016, 9:13 AM  [place] MAIN [facility] SERVICES [address] [place], [place], [address] Phone: [phone]   Fax:  [phone]  Name: Bryent Collman MRN: [id number] Date of Birth: March 24, 1956

## 2016-11-22 NOTE — Therapy (Signed)
[place] MAIN [facility] SERVICES [address] [place], [place], [address] Phone: [phone]   Fax:  [phone]  Occupational Therapy Treatment  Patient Details  Name: Jason Oconnor MRN: [id number] Date of Birth: Feb 15, 1956 No Data Recorded  Encounter Date: 11/22/2016      OT End of Session - 11/22/16 0943    Visit Number 29   Number of Visits 22   Date for OT Re-Evaluation 01/24/17   OT Start Time 0845   OT Stop Time 0930   OT Time Calculation (min) 45 min   Activity Tolerance Patient tolerated treatment well   Behavior During Therapy [facility] for tasks assessed/performed      Past Medical History:  Diagnosis Date  . Bone marrow disease   . Hypertension     No past surgical history on file.  There were no vitals filed for this visit.      Subjective Assessment - 11/22/16 0939    Subjective  Pt. reports he is sneaking the Baclofen so that he can be at the proper dose, as directed. Pt. reports his wife is concerned about it being addictive.    Pertinent History Patient reports on July 13, 2016 he was helping a friend doing home repairs and felt tired, went home and started to notice problems moving around.  The next morning he feel trying to put his slippers on, wife transported him to ER and [facility] for 3 days and then went to [facility] for [facility]hab and was discharged this past Saturday to home.     Patient Stated Goals "I want to get back to close as normal as possible, play golf again, work in the garage."     Currently in Pain? No/denies   Pain Score 0-No pain      OT TREATMENT    Neuro muscular re-education:  Pt. Worked on grasping , flipping, and stacking [place] style pegs with his left hand, with increasing speed with emphasis on motor control. Pt. Worked on grasping, stacking, and unstacking small checker discs which were placed on a nonskid resistive mat. Pt. required increased time, and cues to complete. Pt. Worked on  flipping cards alternating using thumb on fingers, fingers on thumb pattern.  Therapeutic Exercise:  Pt. worked on the [employer]. Pt. performed bicep curls with 2.5#, and triceps press with 7.5# of resistive weight. Rowing with 12.5# with the rope attachment.                   OT Treatments/Exercises (OP) - 11/21/16 2136      Fine Motor Coordination   Other Fine Motor Exercises Manipulation of checkers from tabletop, placing into vertical grid, elevated surface, cues for prehension patterns, instructions on moving object from fingertip to palm and back as well as stacking objects.  Patient has difficulty with using the hand for storage and drops items frequently out of the ulnar side of the hand.      Neurological Re-education Exercises   Other Exercises 1 Patient seen for exercises in standing at tower for triceps press 7.5# for 15 reps, 12.5# for 10 reps with cues for technique, biceps curl with 2.5# for 15 reps, 7.5# for 10 reps.  Rowing with 12.5#, shoulder retraction with rope for 10 reps for 3 sets.                  OT Education - 11/22/16 1006    Education provided Yes   Education Details [facility]  Person(s) Educated Patient   Methods Explanation;Demonstration   Comprehension Verbalized understanding;Returned demonstration             OT Long Term Goals - 11/01/16 0852      OT LONG TERM GOAL #1   Title Patient to improve left UE coordination sufficient to tie shoes with modified independence.    Baseline unable   Time 12   Period Weeks   Status New     OT LONG TERM GOAL #2   Title Patient to improve LUE coordination to complete buttons, snaps and zippers on clothing with modified independence.   Time 12   Period Weeks   Status On-going     OT LONG TERM GOAL #3   Title Patient to complete bathing with modified independence including using left arm to wash right side of body.    Time 12   Period Weeks   Status New     OT LONG TERM GOAL #4    Title Patient to improve left hand use to type on computer using both hands on home row keys, 2 lines with no errors in 5 minutes.    Baseline unable   Time 12   Period Weeks   Status On-going     OT LONG TERM GOAL #5   Title Patient will improve grip on left hand by 10# to stabilize jars and containers for opening.    Baseline unable   Time 12   Period Weeks   Status On-going     Long Term Additional Goals   Additional Long Term Goals Yes     OT LONG TERM GOAL #6   Title Patient will be able to demonstrate sweeping with use of bilateral UEs with modified independence.    Baseline unable   Time 12   Period Weeks   Status On-going     OT LONG TERM GOAL #7   Title Patient will improve grip strength in left hand to be able to stabilize utensils to cut meat with modified independence.    Baseline unable    Time 12   Period Weeks   Status New     OT LONG TERM GOAL #8   Title Patient will be able to demonstrate increased strength in LUE to reach into microwave and retrieve plate of food with modified independence.   Baseline unable.    Time 12   Period Weeks   Status New     OT LONG TERM GOAL  #9   Baseline Patient will improve left UE control and reach to operate left turning signal in the car with modified independence.    Time 12   Period Weeks   Status New     OT LONG TERM GOAL  #10   TITLE Patient will be able to hold and manage large brush to wash his truck with modified independence.    Baseline unable   Time 12   Period Weeks   Status New               Plan - 11/22/16 0944    Clinical Impression Statement Pt. is making progress overall with LUE strength, and coordination skills. Pt. continues to be able to tolerate resistive weight on the [employer] exercise tower. Pt. continues to work on increasing resistive weight to improve LUE strength. Pt. continues to require work on improving the development of [facility] skills with the left hand in order to use it for  ADL/IADLtasks.   Rehab Potential Good  OT Frequency 2x / week   OT Duration 12 weeks   OT Treatment/Interventions Self-care/ADL training;Therapeutic exercise;Neuromuscular education;Visual/perceptual remediation/compensation;Therapist, nutritional;Therapeutic exercises;Patient/family education;DME and/or AE instruction;Manual Therapy;Therapeutic activities;Balance training   Consulted and Agree with Plan of Care Patient      Patient will benefit from skilled therapeutic intervention in order to improve the following deficits and impairments:  Decreased knowledge of use of DME, Decreased coordination, Impaired sensation, Decreased activity tolerance, Decreased range of motion, Decreased strength, Decreased balance, Difficulty walking, Impaired UE functional use  Visit Diagnosis: Muscle weakness (generalized)  Other lack of coordination    Problem List Patient Active Problem List   Diagnosis Date Noted  . 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 Neurocognitive disorder   . Left foot drop   . Benign essential HTN   . Hemiparesis affecting nondominant side as late effect of cerebrovascular accident (CVA) ([place]) 07/18/2016  . Right basal ganglia embolic stroke ([place]) [id number]  . CVA (cerebral infarction) 07/15/2016  . TIA (transient ischemic attack) 07/14/2016  . HTN, goal below 140/80 07/14/2016  . Left facial numbness 07/14/2016  . Weakness of left side of body 07/14/2016    Harrel Carina, MS, OTR/L 11/22/2016, 10:07 AM  [place] MAIN [facility] SERVICES [address] [place], [place], [address] Phone: [phone]   Fax:  [phone]  Name: Jason Oconnor MRN: [id number] Date of Birth: 1956/03/29

## 2016-11-22 NOTE — Patient Instructions (Addendum)
OT TREATMENT    Neuro muscular re-education:  Pt. Worked on grasping , flipping, and stacking [place] style pegs with his left hand, with increasing speed with emphasis on motor control. Pt. Worked on grasping, stacking, and unstacking small checker discs which were placed on a nonskid resistive mat. Pt. required increased time, and cues to complete. Pt. Worked on flipping cards alternating using thumb on fingers, fingers on thumb pattern.  Therapeutic Exercise:  Pt. worked on the [employer]. Pt. performed bicep curls with 2.5#, and triceps press with 7.5# of resistive weight. Rowing with 12.5# with the rope attachment.

## 2016-11-27 ENCOUNTER — Ambulatory Visit: Payer: 59 | Attending: Physical Medicine & Rehabilitation

## 2016-11-27 ENCOUNTER — Ambulatory Visit: Payer: 59 | Admitting: Occupational Therapy

## 2016-11-27 DIAGNOSIS — M6281 Muscle weakness (generalized): Secondary | ICD-10-CM | POA: Insufficient documentation

## 2016-11-27 DIAGNOSIS — R278 Other lack of coordination: Secondary | ICD-10-CM

## 2016-11-27 DIAGNOSIS — R2681 Unsteadiness on feet: Secondary | ICD-10-CM | POA: Diagnosis present

## 2016-11-27 NOTE — Therapy (Signed)
[place] MAIN [facility] SERVICES [address] [place], [place], [address] Phone: [phone]   Fax:  [phone]  Physical Therapy Treatment  Patient Details  Name: Jason Oconnor MRN: [id number] Date of Birth: September 25, 1956 Referring Provider: Dr. Naaman Plummer  Encounter Date: 11/27/2016      PT End of Session - 11/27/16 0807    Visit Number 29   Number of Visits 37   Date for PT Re-Evaluation 12/11/16   Authorization Type no g codes   PT Start Time 0800   PT Stop Time 0845   PT Time Calculation (min) 45 min   Equipment Utilized During Treatment Gait belt   Activity Tolerance Patient tolerated treatment well   Behavior During Therapy [facility] for tasks assessed/performed      Past Medical History:  Diagnosis Date  . Bone marrow disease   . Hypertension     History reviewed. No pertinent surgical history.  There were no vitals filed for this visit.      Subjective Assessment - 11/27/16 0812    Subjective Patient reports he has difficulty with self motivating secondary to cold weather. Patient reports the exercises at home are becoming easier to perform.    Patient is accompained by: Family member  wife   Pertinent History Patient is a 61 year old right-handed male with history of hypertension. Per chart review patient is married. 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 Presented to [facility] 07/14/2016 with left-sided weakness and facial droop. Blood pressure elevated 170/106. Head CT scan negative. 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 CTA of the head showed no emergent large vessel occlusion or severe stenosis. Carotid Dopplers with no ICA stenosis. Patient did not receive TPA. Echocardiogram with ejection fraction of 60%. Normal systolic function. Placed on aspirin  and Plavix for CVA prophylaxis. Subcutaneous Lovenox for DVT prophylaxis.  Patient transferred to CIR on 07/16/2016. Pt discharged form CIR on 08/03/16. Pt reports that he made progress at CIR. No reported problems since returning home. Currently using a wide base quad cane in RUE and L AFO. Pt reports that he has made the last progress with respect to his LUE strength.    Patient Stated Goals "Get back as close as I can to normal." Pt would to be able to play golf. Pt would like to return to some kind of work.       TREATMENT:  Modalities  NMES pulse width 380uS, Intensity 44, Rate: 50Hz , waveform: asymmetrical 8 minutes total time. 15 sec on/ 30 sec off against a 10 # weight with patient in sitting to improve motor recruitment. Active ankle Dorsiflexion performed throughout entirety of motion.   Therapeutic Exercise: Seated hamstring curls with GTB - 3 x 10 Sit to stand with cueing on speed - 3 x 20 Single leg hip extension at MATRIX - 2 x 15 Stepping lunges on the dynadisc - x 20 Toe raises with B UE support - 2 x 20  TKE with single leg - 2 x 30 with double black band Tandem stance - 30sec B; Tandem stance on dynadisc - 30 sec; Widened tandem ant/post weight shifts - x20 Standing hamstring curls - x20       PT Education - 11/27/16 [id number]    Education provided Yes   Education Details form/technique with exercises   Person(s) Educated Patient  Methods Explanation;Demonstration   Comprehension Verbalized understanding;Returned demonstration             PT Long Term Goals - 10/30/16 0955      PT LONG TERM GOAL #1   Title Pt will be independent with HEP in order to improve strength and balance in order to decrease fall risk and improve function at home and work.   Baseline 10/30/16: Mod-min A for exericise progression/ performance   Time 6   Period Weeks   Status On-going     PT LONG TERM GOAL #2   Title Pt will improve BERG by at least 3 points in order to demonstrate  clinically significant improvement in balance.   Baseline 08/07/16: 49/56 10/02/16: 53/56   Time 8   Period Weeks   Status Achieved     PT LONG TERM GOAL #3   Title Pt will decrease TUG to below 14 seconds in order to demonstrate decreased fall risk    Baseline 08/07/16: 21.9 seconds; 10/02/16: 12.3    Time 8   Period Weeks   Status Achieved     PT LONG TERM GOAL #4   Title Pt will decrease 5TSTS by at least 3 seconds in order to demonstrate clinically significant improvement in LE strength   Baseline 08/07/16: 18.5 seconds; 10/02/16: 14.3   Time 8   Period Weeks   Status Achieved     PT LONG TERM GOAL #5   Title Pt will increase 10MWT by at least 0.13 m/s in order to demonstrate clinically significant improvement in community ambulation   Baseline 08/07/16: 0.53 m/s 10/02/16: .97 m/s   Time 8   Period Weeks   Status Achieved     PT LONG TERM GOAL #6   Title Pt will be able to swing a golf club safely in order to complete 9 holes of golf   Baseline 10/30/16: Unable to swing golf club safely   Time 6   Period Weeks   Status On-going     PT LONG TERM GOAL #7   Title Pt will improve 6MWT by at least 150ft  to demonstrate improved aerobic capacity/endurance   Baseline 582 ft 10/02/16: 113ft   Time 7   Period Weeks   Status Achieved     PT LONG TERM GOAL #8   Title Patient will improve DGI by 4 points to demonstrate improvement in dynamic gait/balance and decrease fall risk   Baseline 10/02/16: 19/24; 10/30/16: 21/24   Time 6   Period Weeks   Status On-going               Plan - 11/27/16 [id number]    Clinical Impression Statement Patient demonstrates improved muscular endurance today with ability to tolerate further sets of exercise with similar fatigue level. Although patient is improving, he continues to demonstrate increased postural sway with exercise indicating decreased dynamic balance and patient willl benefit from further skilled therapy to return to prior level of  function.    Rehab Potential Good   Clinical Impairments Affecting Rehab Potential Positive: motivation, prior functional level; Negative: no active dorsiflexion, increased LUE tone   PT Frequency 2x / week   PT Duration 6 weeks   PT Treatment/Interventions ADLs/Self Care Home Management;Biofeedback;Canalith Repostioning;Cryotherapy;Electrical Stimulation;Iontophoresis 4mg /ml Dexamethasone;Aquatic Therapy;Moist Heat;Traction;Ultrasound;Gait training;Stair training;Functional mobility training;Therapeutic activities;DME Instruction;Therapeutic exercise;Balance training;Neuromuscular re-education;Cognitive remediation;Patient/family education;Orthotic Fit/Training;Manual techniques;Passive range of motion;Energy conservation;Taping;Vestibular   PT Next Visit Plan Clamshell; DGI, LEFS, NME for L anterior tibialis, Walk-aide, progress balance, strengthening, and HEP; Consider  sidelying hip abduction exercises   PT Home Exercise Plan Sit to stand, seated tband resisted clams, seated adduction squeeze   Consulted and Agree with Plan of Care Patient      Patient will benefit from skilled therapeutic intervention in order to improve the following deficits and impairments:  Abnormal gait, Decreased balance, Decreased coordination, Decreased mobility, Decreased range of motion, Decreased strength, Difficulty walking, Impaired tone, Impaired UE functional use  Visit Diagnosis: Muscle weakness (generalized)  Other lack of coordination  Unsteadiness on feet     Problem List Patient Active Problem List   Diagnosis Date Noted  . 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 Neurocognitive disorder   . Left foot drop   . Benign essential HTN   . Hemiparesis affecting nondominant side as late effect of cerebrovascular accident (CVA) ([place]) 07/18/2016  . Right basal ganglia embolic stroke ([place]) [id number]  . CVA (cerebral infarction) 07/15/2016  . TIA (transient ischemic attack) 07/14/2016  . HTN, goal  below 140/80 07/14/2016  . Left facial numbness 07/14/2016  . Weakness of left side of body 07/14/2016    Blythe Stanford, PT DPT 11/27/2016, 8:46 AM  [place] MAIN [facility] SERVICES [address] [place], [place], [address] Phone: [phone]   Fax:  [phone]  Name: Jason Oconnor MRN: [id number] Date of Birth: [birth year]

## 2016-11-29 ENCOUNTER — Ambulatory Visit: Payer: 59 | Admitting: Occupational Therapy

## 2016-11-29 ENCOUNTER — Ambulatory Visit: Payer: 59

## 2016-11-29 DIAGNOSIS — R278 Other lack of coordination: Secondary | ICD-10-CM

## 2016-11-29 DIAGNOSIS — M6281 Muscle weakness (generalized): Secondary | ICD-10-CM | POA: Diagnosis not present

## 2016-11-29 DIAGNOSIS — R2681 Unsteadiness on feet: Secondary | ICD-10-CM

## 2016-11-29 NOTE — Therapy (Signed)
[place] MAIN [facility] SERVICES [address] [place], [place], [address] Phone: [phone]   Fax:  [phone]  Physical Therapy Treatment  Patient Details  Name: Jason Oconnor MRN: [id number] Date of Birth: [birth year] Referring Provider: Dr. Naaman Plummer  Encounter Date: 11/29/2016      PT End of Session - 11/29/16 0837    Visit Number 30   Number of Visits 37   Date for PT Re-Evaluation 12/11/16   Authorization Type no g codes   PT Start Time 0801   PT Stop Time 0845   PT Time Calculation (min) 44 min   Equipment Utilized During Treatment Gait belt   Activity Tolerance Patient tolerated treatment well   Behavior During Therapy [facility] for tasks assessed/performed      Past Medical History:  Diagnosis Date  . Bone marrow disease   . Hypertension     History reviewed. No pertinent surgical history.  There were no vitals filed for this visit.      Subjective Assessment - 11/29/16 0811    Subjective Patient reports no new changes since previous visit. Patient reports he's doing well overall.    Patient is accompained by: Family member  wife   Pertinent History Patient is a 61 year old right-handed male with history of hypertension. Per chart review patient is married. 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 Presented to [facility] 07/14/2016 with left-sided weakness and facial droop. Blood pressure elevated 170/106. Head CT scan negative. 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 CTA of the head showed no emergent large vessel occlusion or severe stenosis. Carotid Dopplers with no ICA stenosis. Patient did not receive TPA. Echocardiogram with ejection fraction of 60%. Normal systolic function. Placed on aspirin and Plavix for CVA prophylaxis. Subcutaneous Lovenox for  DVT prophylaxis.  Patient transferred to CIR on 07/16/2016. Pt discharged form CIR on 08/03/16. Pt reports that he made progress at CIR. No reported problems since returning home. Currently using a wide base quad cane in RUE and L AFO. Pt reports that he has made the last progress with respect to his LUE strength.    Patient Stated Goals "Get back as close as I can to normal." Pt would to be able to play golf. Pt would like to return to some kind of work.       TREATMENT:  Modalities  NMES pulse width 380uS, Intensity 44, Rate: 50Hz , waveform: asymmetrical 8 minutes total time. 15 sec on/ 30 sec off against a 10 # weight with patient in sitting to improve motor recruitment. Active ankle Dorsiflexion performed throughout entirety of motion.   Therapeutic Exercise: Seated hamstring curls with GTB - 2 x 10 Resisted DF in sitting with GTB - 2 x 20  Sit to stand with cueing on speed - 2 x 20 with 5 # shoulder flexion overhead  Single leg hip extension at MATRIX - 2 x 10 12.5# with combined knee flexion Step taps from airex pad onto cones from airex pad - x20 Amb with focus on stride length and improving arm swing - 10 x 20ft  Weight shifts widened tandem stance - x25 B       PT Education - 11/29/16 [id number]    Education provided Yes   Education Details form/technique throughout session   Person(s) Educated Patient   Methods Explanation;Demonstration   Comprehension  Verbalized understanding;Returned demonstration             PT Long Term Goals - 10/30/16 0955      PT LONG TERM GOAL #1   Title Pt will be independent with HEP in order to improve strength and balance in order to decrease fall risk and improve function at home and work.   Baseline 10/30/16: Mod-min A for exericise progression/ performance   Time 6   Period Weeks   Status On-going     PT LONG TERM GOAL #2   Title Pt will improve BERG by at least 3 points in order to demonstrate clinically significant improvement in balance.    Baseline 08/07/16: 49/56 10/02/16: 53/56   Time 8   Period Weeks   Status Achieved     PT LONG TERM GOAL #3   Title Pt will decrease TUG to below 14 seconds in order to demonstrate decreased fall risk    Baseline 08/07/16: 21.9 seconds; 10/02/16: 12.3    Time 8   Period Weeks   Status Achieved     PT LONG TERM GOAL #4   Title Pt will decrease 5TSTS by at least 3 seconds in order to demonstrate clinically significant improvement in LE strength   Baseline 08/07/16: 18.5 seconds; 10/02/16: 14.3   Time 8   Period Weeks   Status Achieved     PT LONG TERM GOAL #5   Title Pt will increase 10MWT by at least 0.13 m/s in order to demonstrate clinically significant improvement in community ambulation   Baseline 08/07/16: 0.53 m/s 10/02/16: .97 m/s   Time 8   Period Weeks   Status Achieved     PT LONG TERM GOAL #6   Title Pt will be able to swing a golf club safely in order to complete 9 holes of golf   Baseline 10/30/16: Unable to swing golf club safely   Time 6   Period Weeks   Status On-going     PT LONG TERM GOAL #7   Title Pt will improve 6MWT by at least 159ft  to demonstrate improved aerobic capacity/endurance   Baseline 582 ft 10/02/16: 1124ft   Time 7   Period Weeks   Status Achieved     PT LONG TERM GOAL #8   Title Patient will improve DGI by 4 points to demonstrate improvement in dynamic gait/balance and decrease fall risk   Baseline 10/02/16: 19/24; 10/30/16: 21/24   Time 6   Period Weeks   Status On-going               Plan - 11/29/16 0846    Clinical Impression Statement Focused on improving hamstring strength and endurance with therapeutic exercises today and patient demonstrates improved ability to perform hamstring exercises in standing. Although patient is improving, he continues to demonstrate decreased muscular control in L LE and will benefit from further skilled therapy to return to prior level of function.    Rehab Potential Good   Clinical Impairments  Affecting Rehab Potential Positive: motivation, prior functional level; Negative: no active dorsiflexion, increased LUE tone   PT Frequency 2x / week   PT Duration 6 weeks   PT Treatment/Interventions ADLs/Self Care Home Management;Biofeedback;Canalith Repostioning;Cryotherapy;Electrical Stimulation;Iontophoresis 4mg /ml Dexamethasone;Aquatic Therapy;Moist Heat;Traction;Ultrasound;Gait training;Stair training;Functional mobility training;Therapeutic activities;DME Instruction;Therapeutic exercise;Balance training;Neuromuscular re-education;Cognitive remediation;Patient/family education;Orthotic Fit/Training;Manual techniques;Passive range of motion;Energy conservation;Taping;Vestibular   PT Next Visit Plan Clamshell; DGI, LEFS, NME for L anterior tibialis, Walk-aide, progress balance, strengthening, and HEP; Consider sidelying hip abduction exercises  PT Home Exercise Plan Sit to stand, seated tband resisted clams, seated adduction squeeze   Consulted and Agree with Plan of Care Patient      Patient will benefit from skilled therapeutic intervention in order to improve the following deficits and impairments:  Abnormal gait, Decreased balance, Decreased coordination, Decreased mobility, Decreased range of motion, Decreased strength, Difficulty walking, Impaired tone, Impaired UE functional use  Visit Diagnosis: Muscle weakness (generalized)  Other lack of coordination  Unsteadiness on feet     Problem List Patient Active Problem List   Diagnosis Date Noted  . 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 Neurocognitive disorder   . Left foot drop   . Benign essential HTN   . Hemiparesis affecting nondominant side as late effect of cerebrovascular accident (CVA) ([place]) 07/18/2016  . Right basal ganglia embolic stroke ([place]) [id number]  . CVA (cerebral infarction) 07/15/2016  . TIA (transient ischemic attack) 07/14/2016  . HTN, goal below 140/80 07/14/2016  . Left facial numbness  07/14/2016  . Weakness of left side of body 07/14/2016    Blythe Stanford, PT DPT 11/29/2016, 9:16 AM  [place] MAIN [facility] SERVICES [address] [place], [place], [address] Phone: [phone]   Fax:  [phone]  Name: [place] Zelazny MRN: [id number] Date of Birth: [birth year]

## 2016-11-30 ENCOUNTER — Encounter: Payer: Self-pay | Admitting: Occupational Therapy

## 2016-11-30 NOTE — Therapy (Signed)
[place] MAIN [facility] SERVICES [address] [place], [place], [address] Phone: [phone]   Fax:  [phone]  Occupational Therapy Treatment  Patient Details  Name: Jason Oconnor MRN: [id number] Date of Birth: 08-Sep-1956 No Data Recorded  Encounter Date: 11/29/2016      OT End of Session - 11/30/16 1906    Visit Number 31   Number of Visits 48   Date for OT Re-Evaluation 01/24/17   Authorization Type 2 of 20 visits for 2018 calendar year   OT Start Time [phone]   OT Stop Time 0930   OT Time Calculation (min) 44 min   Activity Tolerance Patient tolerated treatment well   Behavior During Therapy [facility] for tasks assessed/performed      Past Medical History:  Diagnosis Date  . Bone marrow disease   . Hypertension     History reviewed. No pertinent surgical history.  There were no vitals filed for this visit.      Subjective Assessment - 11/30/16 1905    Subjective  Patient reports he is continuing to do exercises at home and trying to use his left hand as much as possible, feels it is getting better.    Pertinent History Patient reports on July 13, 2016 he was helping a friend doing home repairs and felt tired, went home and started to notice problems moving around.  The next morning he feel trying to put his slippers on, wife transported him to ER and [facility] for 3 days and then went to [facility] for REhab and was discharged this past Saturday to home.     Patient Stated Goals "I want to get back to close as normal as possible, play golf again, work in the garage."     Currently in Pain? No/denies   Pain Score 0-No pain                      OT Treatments/Exercises (OP) - 11/30/16 1908      Fine Motor Coordination   Other Fine Motor Exercises Patient seen for manipulation of nuts and bolts to place into elevated board, vertical position, cues for thumb finger combinations. Manipulation of round pegs from container, cues to  turn pegs to place into board.       Neurological Re-education Exercises   Other Exercises 1 Patient seen for multi directional reaching tasks with left UE, cues for reaching patterns and to decrease substitution of movements.                 OT Education - 11/30/16 1906    Education provided Yes   Education Details home exercises   Person(s) Educated Patient   Methods Explanation;Demonstration;Verbal cues   Comprehension Verbal cues required;Returned demonstration;Verbalized understanding             OT Long Term Goals - 11/01/16 0852      OT LONG TERM GOAL #1   Title Patient to improve left UE coordination sufficient to tie shoes with modified independence.    Baseline unable   Time 12   Period Weeks   Status New     OT LONG TERM GOAL #2   Title Patient to improve LUE coordination to complete buttons, snaps and zippers on clothing with modified independence.   Time 12   Period Weeks   Status On-going     OT LONG TERM GOAL #3   Title Patient to complete bathing with modified independence including using left  arm to wash right side of body.    Time 12   Period Weeks   Status New     OT LONG TERM GOAL #4   Title Patient to improve left hand use to type on computer using both hands on home row keys, 2 lines with no errors in 5 minutes.    Baseline unable   Time 12   Period Weeks   Status On-going     OT LONG TERM GOAL #5   Title Patient will improve grip on left hand by 10# to stabilize jars and containers for opening.    Baseline unable   Time 12   Period Weeks   Status On-going     Long Term Additional Goals   Additional Long Term Goals Yes     OT LONG TERM GOAL #6   Title Patient will be able to demonstrate sweeping with use of bilateral UEs with modified independence.    Baseline unable   Time 12   Period Weeks   Status On-going     OT LONG TERM GOAL #7   Title Patient will improve grip strength in left hand to be able to stabilize utensils  to cut meat with modified independence.    Baseline unable    Time 12   Period Weeks   Status New     OT LONG TERM GOAL #8   Title Patient will be able to demonstrate increased strength in LUE to reach into microwave and retrieve plate of food with modified independence.   Baseline unable.    Time 12   Period Weeks   Status New     OT LONG TERM GOAL  #9   Baseline Patient will improve left UE control and reach to operate left turning signal in the car with modified independence.    Time 12   Period Weeks   Status New     OT LONG TERM GOAL  #10   TITLE Patient will be able to hold and manage large brush to wash his truck with modified independence.    Baseline unable   Time 12   Period Weeks   Status New               Plan - 11/30/16 1906    Clinical Impression Statement Patient continues to progress in all areas, improved fine motor coordination skills and working towards refining movements.  L shoulder fatigues easily with reaching tasks and requires frequent rest breaks.    Rehab Potential Good   OT Frequency 2x / week   OT Duration 12 weeks   OT Treatment/Interventions Self-care/ADL training;Therapeutic exercise;Neuromuscular education;Visual/perceptual remediation/compensation;Therapist, nutritional;Therapeutic exercises;Patient/family education;DME and/or AE instruction;Manual Therapy;Therapeutic activities;Balance training   Consulted and Agree with Plan of Care Patient      Patient will benefit from skilled therapeutic intervention in order to improve the following deficits and impairments:  Decreased knowledge of use of DME, Decreased coordination, Impaired sensation, Decreased activity tolerance, Decreased range of motion, Decreased strength, Decreased balance, Difficulty walking, Impaired UE functional use  Visit Diagnosis: Muscle weakness (generalized)  Other lack of coordination    Problem List Patient Active Problem List   Diagnosis Date  Noted  . 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 Neurocognitive disorder   . Left foot drop   . Benign essential HTN   . Hemiparesis affecting nondominant side as late effect of cerebrovascular accident (CVA) ([place]) 07/18/2016  . Right basal ganglia embolic stroke ([place]) [id number]  . CVA (cerebral  infarction) 07/15/2016  . TIA (transient ischemic attack) 07/14/2016  . HTN, goal below 140/80 07/14/2016  . Left facial numbness 07/14/2016  . Weakness of left side of body 07/14/2016   Vinia Jemmott Oneita Jolly, OTR/L, CLT  [place] 11/30/2016, 7:12 PM  [place] MAIN [facility] SERVICES [address] [place], [place], [address] Phone: [phone]   Fax:  [phone]  Name: Jason Oconnor MRN: [id number] Date of Birth: Mar 30, 1956

## 2016-11-30 NOTE — Therapy (Signed)
[place] MAIN [facility] SERVICES [address] [place], [place], [address] Phone: [phone]   Fax:  [phone]  Occupational Therapy Treatment  Patient Details  Name: Jason Oconnor MRN: [id number] Date of Birth: 03-Jul-1956 No Data Recorded  Encounter Date: 11/27/2016      OT End of Session - 11/30/16 1733    Visit Number 30   Number of Visits 48   Date for OT Re-Evaluation 01/24/17   Authorization Type 1 of 20 visits for 2018 calendar year   OT Start Time 0845   OT Stop Time 0931   OT Time Calculation (min) 46 min   Activity Tolerance Patient tolerated treatment well   Behavior During Therapy [facility] for tasks assessed/performed      Past Medical History:  Diagnosis Date  . Bone marrow disease   . Hypertension     No past surgical history on file.  There were no vitals filed for this visit.      Subjective Assessment - 11/30/16 1730    Subjective  Patient reports he is doing well, had an uneventful New Year's.  Has been taking the baclofen about 2 times a day and has felt fine, has not noticed increased spasticity.    Pertinent History Patient reports on July 13, 2016 he was helping a friend doing home repairs and felt tired, went home and started to notice problems moving around.  The next morning he feel trying to put his slippers on, wife transported him to ER and [facility] for 3 days and then went to [facility] for REhab and was discharged this past Saturday to home.     Patient Stated Goals "I want to get back to close as normal as possible, play golf again, work in the garage."     Currently in Pain? No/denies   Pain Score 0-No pain                      OT Treatments/Exercises (OP) - 11/30/16 1738      Fine Motor Coordination   Other Fine Motor Exercises Patient seen for stacking small checkers with grooves with cues for prehension patterns for thumb, index and thumb, middle finger.       Neurological Re-education  Exercises   Other Exercises 1 Patient seen for strengthening exercises and ROM, Saebo tower four levels, up and down for 3 complete trials.  Balloon volley for 5 sets of 25 reps.  Diona Foley raises e sets of 15 reps each, cues for for and technique.  Supination of left forearm with short dowel stick with guiding from therapist to achieve full range.                  OT Education - 11/30/16 1732    Education provided Yes   Education Details ROM and strengthening exercises using 1-2 pound weight   Person(s) Educated Patient   Methods Demonstration;Explanation;Verbal cues   Comprehension Verbal cues required;Returned demonstration;Verbalized understanding             OT Long Term Goals - 11/01/16 [id number]      OT LONG TERM GOAL #1   Title Patient to improve left UE coordination sufficient to tie shoes with modified independence.    Baseline unable   Time 12   Period Weeks   Status New     OT LONG TERM GOAL #2   Title Patient to improve LUE coordination to complete buttons, snaps and zippers on clothing with  modified independence.   Time 12   Period Weeks   Status On-going     OT LONG TERM GOAL #3   Title Patient to complete bathing with modified independence including using left arm to wash right side of body.    Time 12   Period Weeks   Status New     OT LONG TERM GOAL #4   Title Patient to improve left hand use to type on computer using both hands on home row keys, 2 lines with no errors in 5 minutes.    Baseline unable   Time 12   Period Weeks   Status On-going     OT LONG TERM GOAL #5   Title Patient will improve grip on left hand by 10# to stabilize jars and containers for opening.    Baseline unable   Time 12   Period Weeks   Status On-going     Long Term Additional Goals   Additional Long Term Goals Yes     OT LONG TERM GOAL #6   Title Patient will be able to demonstrate sweeping with use of bilateral UEs with modified independence.    Baseline unable    Time 12   Period Weeks   Status On-going     OT LONG TERM GOAL #7   Title Patient will improve grip strength in left hand to be able to stabilize utensils to cut meat with modified independence.    Baseline unable    Time 12   Period Weeks   Status New     OT LONG TERM GOAL #8   Title Patient will be able to demonstrate increased strength in LUE to reach into microwave and retrieve plate of food with modified independence.   Baseline unable.    Time 12   Period Weeks   Status New     OT LONG TERM GOAL  #9   Baseline Patient will improve left UE control and reach to operate left turning signal in the car with modified independence.    Time 12   Period Weeks   Status New     OT LONG TERM GOAL  #10   TITLE Patient will be able to hold and manage large brush to wash his truck with modified independence.    Baseline unable   Time 12   Period Weeks   Status New               Plan - 11/30/16 1734    Clinical Impression Statement Patient is progressing towards improved ROM in LUE and working to use arm more in daily activities, such as cooking.  Patient continues to have a limitation in supination of the left forearm and responds well to guiding techniques from therapist.  Encouraged patient to perform coordination exercises at home on a daily basis to improve skills.    Rehab Potential Good   OT Frequency 2x / week   OT Duration 12 weeks   OT Treatment/Interventions Self-care/ADL training;Therapeutic exercise;Neuromuscular education;Visual/perceptual remediation/compensation;Therapist, nutritional;Therapeutic exercises;Patient/family education;DME and/or AE instruction;Manual Therapy;Therapeutic activities;Balance training   Consulted and Agree with Plan of Care Patient      Patient will benefit from skilled therapeutic intervention in order to improve the following deficits and impairments:  Decreased knowledge of use of DME, Decreased coordination, Impaired  sensation, Decreased activity tolerance, Decreased range of motion, Decreased strength, Decreased balance, Difficulty walking, Impaired UE functional use  Visit Diagnosis: Muscle weakness (generalized)  Other lack of coordination  Problem List Patient Active Problem List   Diagnosis Date Noted  . 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 Neurocognitive disorder   . Left foot drop   . Benign essential HTN   . Hemiparesis affecting nondominant side as late effect of cerebrovascular accident (CVA) ([place]) 07/18/2016  . Right basal ganglia embolic stroke ([place]) [id number]  . CVA (cerebral infarction) 07/15/2016  . TIA (transient ischemic attack) 07/14/2016  . HTN, goal below 140/80 07/14/2016  . Left facial numbness 07/14/2016  . Weakness of left side of body 07/14/2016   [name] Oneita Jolly, OTR/L, CLT  [name],[name] 11/30/2016, 5:48 PM  [place] MAIN [facility] SERVICES [address] [place], [place], [address] Phone: [phone]   Fax:  [phone]  Name: Jason Oconnor MRN: [id number] Date of Birth: 1956-04-14

## 2016-12-04 ENCOUNTER — Ambulatory Visit: Payer: 59 | Admitting: Occupational Therapy

## 2016-12-04 ENCOUNTER — Encounter: Payer: Self-pay | Admitting: Occupational Therapy

## 2016-12-04 ENCOUNTER — Ambulatory Visit: Payer: 59

## 2016-12-04 DIAGNOSIS — M6281 Muscle weakness (generalized): Secondary | ICD-10-CM

## 2016-12-04 DIAGNOSIS — R278 Other lack of coordination: Secondary | ICD-10-CM

## 2016-12-04 DIAGNOSIS — R2681 Unsteadiness on feet: Secondary | ICD-10-CM

## 2016-12-04 NOTE — Therapy (Signed)
[place] MAIN [facility] SERVICES [address] [place], [place], [address] Phone: [phone]   Fax:  [phone]  Physical Therapy Treatment  Patient Details  Name: Jason Oconnor MRN: [id number] Date of Birth: 1956/09/10 Referring Provider: Dr. Naaman Plummer  Encounter Date: 12/04/2016      PT End of Session - 12/04/16 0827    Visit Number 31   Number of Visits 37   Date for PT Re-Evaluation 12/11/16   Authorization Type no g codes   PT Start Time 0800   PT Stop Time 0845   PT Time Calculation (min) 45 min   Equipment Utilized During Treatment Gait belt   Activity Tolerance Patient tolerated treatment well   Behavior During Therapy [facility] for tasks assessed/performed      Past Medical History:  Diagnosis Date  . Bone marrow disease   . Hypertension     History reviewed. No pertinent surgical history.  There were no vitals filed for this visit.      Subjective Assessment - 12/04/16 0811    Subjective Patient reports no new changes since the previous visit. Patient reports he has to focus lifting his thigh in order to walk.    Patient is accompained by: Family member  wife   Pertinent History Patient is a 61 year old right-handed male with history of hypertension. Per chart review patient is married. 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 Presented to [facility] 07/14/2016 with left-sided weakness and facial droop. Blood pressure elevated 170/106. Head CT scan negative. 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 CTA of the head showed no emergent large vessel occlusion or severe stenosis. Carotid Dopplers with no ICA stenosis. Patient did not receive TPA. Echocardiogram with ejection fraction of 60%. Normal systolic function. Placed on aspirin and Plavix for CVA  prophylaxis. Subcutaneous Lovenox for DVT prophylaxis.  Patient transferred to CIR on 07/16/2016. Pt discharged form CIR on 08/03/16. Pt reports that he made progress at CIR. No reported problems since returning home. Currently using a wide base quad cane in RUE and L AFO. Pt reports that he has made the last progress with respect to his LUE strength.    Patient Stated Goals "Get back as close as I can to normal." Pt would to be able to play golf. Pt would like to return to some kind of work.       TREATMENT:  Modalities  NMES pulse width 380uS, Intensity 44, Rate: 50Hz , waveform: asymmetrical 8 minutes total time. 15 sec on/ 30 sec off against a 10 # weight with patient in sitting to improve motor recruitment. Active ankle Dorsiflexion performed throughout entirety of motion.   Therapeutic Exercise: Seated hamstring curls with GTB - 2 x 15 Stepping lunges onto dynadisc - x20 Standing weight shifts in tandem stance - 2 x 20  Hip abduction with foot on airex pad - 2 x 20  Sit to stand with cueing on speed - 2 x 25 Heel raises from half blue foam - 2 x 20        PT Education - 12/04/16 0823    Education provided Yes   Education Details sit to stands for home program   Person(s) Educated Patient   Methods Explanation;Demonstration   Comprehension Returned demonstration             PT Long Term  Goals - 10/30/16 0955      PT LONG TERM GOAL #1   Title Pt will be independent with HEP in order to improve strength and balance in order to decrease fall risk and improve function at home and work.   Baseline 10/30/16: Mod-min A for exericise progression/ performance   Time 6   Period Weeks   Status On-going     PT LONG TERM GOAL #2   Title Pt will improve BERG by at least 3 points in order to demonstrate clinically significant improvement in balance.   Baseline 08/07/16: 49/56 10/02/16: 53/56   Time 8   Period Weeks   Status Achieved     PT LONG TERM GOAL #3   Title Pt will decrease  TUG to below 14 seconds in order to demonstrate decreased fall risk    Baseline 08/07/16: 21.9 seconds; 10/02/16: 12.3    Time 8   Period Weeks   Status Achieved     PT LONG TERM GOAL #4   Title Pt will decrease 5TSTS by at least 3 seconds in order to demonstrate clinically significant improvement in LE strength   Baseline 08/07/16: 18.5 seconds; 10/02/16: 14.3   Time 8   Period Weeks   Status Achieved     PT LONG TERM GOAL #5   Title Pt will increase 10MWT by at least 0.13 m/s in order to demonstrate clinically significant improvement in community ambulation   Baseline 08/07/16: 0.53 m/s 10/02/16: .97 m/s   Time 8   Period Weeks   Status Achieved     PT LONG TERM GOAL #6   Title Pt will be able to swing a golf club safely in order to complete 9 holes of golf   Baseline 10/30/16: Unable to swing golf club safely   Time 6   Period Weeks   Status On-going     PT LONG TERM GOAL #7   Title Pt will improve 6MWT by at least 125ft  to demonstrate improved aerobic capacity/endurance   Baseline 582 ft 10/02/16: 1134ft   Time 7   Period Weeks   Status Achieved     PT LONG TERM GOAL #8   Title Patient will improve DGI by 4 points to demonstrate improvement in dynamic gait/balance and decrease fall risk   Baseline 10/02/16: 19/24; 10/30/16: 21/24   Time 6   Period Weeks   Status On-going               Plan - 12/04/16 0837    Clinical Impression Statement Continued to focus on improving gastrocnemius/hamstring strength/coordination and balance in tandem positions to help with ambulation and foot progression. Patient demonstrates increased postural sway with balancing activities indicating decreased motor control and balance; patient will benefit from further skilled therapy to return to prior level of function.    Rehab Potential Good   Clinical Impairments Affecting Rehab Potential Positive: motivation, prior functional level; Negative: no active dorsiflexion, increased LUE tone   PT  Frequency 2x / week   PT Duration 6 weeks   PT Treatment/Interventions ADLs/Self Care Home Management;Biofeedback;Canalith Repostioning;Cryotherapy;Electrical Stimulation;Iontophoresis 4mg /ml Dexamethasone;Aquatic Therapy;Moist Heat;Traction;Ultrasound;Gait training;Stair training;Functional mobility training;Therapeutic activities;DME Instruction;Therapeutic exercise;Balance training;Neuromuscular re-education;Cognitive remediation;Patient/family education;Orthotic Fit/Training;Manual techniques;Passive range of motion;Energy conservation;Taping;Vestibular   PT Next Visit Plan Clamshell; DGI, LEFS, NME for L anterior tibialis, Walk-aide, progress balance, strengthening, and HEP; Consider sidelying hip abduction exercises   PT Home Exercise Plan Sit to stand, seated tband resisted clams, seated adduction squeeze   Consulted and Agree with  Plan of Care Patient      Patient will benefit from skilled therapeutic intervention in order to improve the following deficits and impairments:  Abnormal gait, Decreased balance, Decreased coordination, Decreased mobility, Decreased range of motion, Decreased strength, Difficulty walking, Impaired tone, Impaired UE functional use  Visit Diagnosis: Muscle weakness (generalized)  Unsteadiness on feet  Other lack of coordination     Problem List Patient Active Problem List   Diagnosis Date Noted  . 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 Neurocognitive disorder   . Left foot drop   . Benign essential HTN   . Hemiparesis affecting nondominant side as late effect of cerebrovascular accident (CVA) ([place]) 07/18/2016  . Right basal ganglia embolic stroke ([place]) [id number]  . CVA (cerebral infarction) 07/15/2016  . TIA (transient ischemic attack) 07/14/2016  . HTN, goal below 140/80 07/14/2016  . Left facial numbness 07/14/2016  . Weakness of left side of body 07/14/2016    Blythe Stanford, PT DPT 12/04/2016, 8:49 AM  [place] MAIN [facility] SERVICES [address] [place], [place], [address] Phone: [phone]   Fax:  [phone]  Name: Jason Oconnor MRN: [id number] Date of Birth: [birth year]

## 2016-12-06 ENCOUNTER — Ambulatory Visit: Payer: 59

## 2016-12-06 ENCOUNTER — Encounter: Payer: Self-pay | Admitting: Occupational Therapy

## 2016-12-06 ENCOUNTER — Ambulatory Visit: Payer: 59 | Admitting: Occupational Therapy

## 2016-12-06 DIAGNOSIS — M6281 Muscle weakness (generalized): Secondary | ICD-10-CM

## 2016-12-06 DIAGNOSIS — R2681 Unsteadiness on feet: Secondary | ICD-10-CM

## 2016-12-06 DIAGNOSIS — R278 Other lack of coordination: Secondary | ICD-10-CM

## 2016-12-06 NOTE — Therapy (Signed)
[place] MAIN [facility] SERVICES [address] [place], [place], [address] Phone: [phone]   Fax:  [phone]  Physical Therapy Treatment  Patient Details  Name: Jason Oconnor MRN: [id number] Date of Birth: 03/02/1956 Referring Provider: Dr. Naaman Plummer  Encounter Date: 12/06/2016      PT End of Session - 12/06/16 0841    Visit Number 32   Number of Visits 37   Date for PT Re-Evaluation 12/11/16   Authorization Type no g codes   PT Start Time 0800   PT Stop Time 0845   PT Time Calculation (min) 45 min   Equipment Utilized During Treatment Gait belt   Activity Tolerance Patient tolerated treatment well   Behavior During Therapy [facility] for tasks assessed/performed      Past Medical History:  Diagnosis Date  . Bone marrow disease   . Hypertension     History reviewed. No pertinent surgical history.  There were no vitals filed for this visit.      Subjective Assessment - 12/06/16 0818    Subjective Patient reports no major chanages since the previous visit and states he is beginning to have R ankle and knee pain.    Patient is accompained by: --  wife   Pertinent History Patient is a 61 year old right-handed male with history of hypertension. Per chart review patient is married. 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 Presented to [facility] 07/14/2016 with left-sided weakness and facial droop. Blood pressure elevated 170/106. Head CT scan negative. 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 CTA of the head showed no emergent large vessel occlusion or severe stenosis. Carotid Dopplers with no ICA stenosis. Patient did not receive TPA. Echocardiogram with ejection fraction of 60%. Normal systolic function. Placed on aspirin and Plavix for CVA prophylaxis. Subcutaneous  Lovenox for DVT prophylaxis.  Patient transferred to CIR on 07/16/2016. Pt discharged form CIR on 08/03/16. Pt reports that he made progress at CIR. No reported problems since returning home. Currently using a wide base quad cane in RUE and L AFO. Pt reports that he has made the last progress with respect to his LUE strength.    Patient Stated Goals "Get back as close as I can to normal." Pt would to be able to play golf. Pt would like to return to some kind of work.    Currently in Pain? No/denies      TREATMENT:  Modalities  NMES pulse width 380uS, Intensity 44, Rate: 50Hz , waveform: asymmetrical 8 minutes total time. 15 sec on/ 30 sec off against a 10 # weight with patient in sitting to improve motor recruitment. Active ankle Dorsiflexion performed throughout entirety of motion.   Therapeutic Exercise: Sit to stand with cueing on speed - 2 x 25 Stepping over half blue foam - x 15 Side stepping at [employer] - x5  Calf stretching in standing - 30sec x 2 hold  Heel raises from half blue foam - 2 x 20  Marches on dynadiscs - 2x 20  Tandem amb on airex beam forward/backward - x5        PT Education - 12/06/16 [id number]    Education provided Yes   Education Details Educated on form/technique throughout treatment session    Person(s) Educated Patient   Methods Explanation;Demonstration   Comprehension Verbalized understanding;Returned demonstration  PT Long Term Goals - 10/30/16 0955      PT LONG TERM GOAL #1   Title Pt will be independent with HEP in order to improve strength and balance in order to decrease fall risk and improve function at home and work.   Baseline 10/30/16: Mod-min A for exericise progression/ performance   Time 6   Period Weeks   Status On-going     PT LONG TERM GOAL #2   Title Pt will improve BERG by at least 3 points in order to demonstrate clinically significant improvement in balance.   Baseline 08/07/16: 49/56 10/02/16: 53/56   Time 8   Period Weeks    Status Achieved     PT LONG TERM GOAL #3   Title Pt will decrease TUG to below 14 seconds in order to demonstrate decreased fall risk    Baseline 08/07/16: 21.9 seconds; 10/02/16: 12.3    Time 8   Period Weeks   Status Achieved     PT LONG TERM GOAL #4   Title Pt will decrease 5TSTS by at least 3 seconds in order to demonstrate clinically significant improvement in LE strength   Baseline 08/07/16: 18.5 seconds; 10/02/16: 14.3   Time 8   Period Weeks   Status Achieved     PT LONG TERM GOAL #5   Title Pt will increase 10MWT by at least 0.13 m/s in order to demonstrate clinically significant improvement in community ambulation   Baseline 08/07/16: 0.53 m/s 10/02/16: .97 m/s   Time 8   Period Weeks   Status Achieved     PT LONG TERM GOAL #6   Title Pt will be able to swing a golf club safely in order to complete 9 holes of golf   Baseline 10/30/16: Unable to swing golf club safely   Time 6   Period Weeks   Status On-going     PT LONG TERM GOAL #7   Title Pt will improve 6MWT by at least 145ft  to demonstrate improved aerobic capacity/endurance   Baseline 582 ft 10/02/16: 1173ft   Time 7   Period Weeks   Status Achieved     PT LONG TERM GOAL #8   Title Patient will improve DGI by 4 points to demonstrate improvement in dynamic gait/balance and decrease fall risk   Baseline 10/02/16: 19/24; 10/30/16: 21/24   Time 6   Period Weeks   Status On-going               Plan - 12/06/16 [id number]    Clinical Impression Statement Patient demonstrates increased postural swy with dynamic exercise indicating decreased dynamic/static balance. Focused on performing sagital plane balance to improve walking ability and patient will benefit from further skilled therapy to return to PLOF.    Rehab Potential Good   Clinical Impairments Affecting Rehab Potential Positive: motivation, prior functional level; Negative: no active dorsiflexion, increased LUE tone   PT Frequency 2x / week   PT Duration 6  weeks   PT Treatment/Interventions ADLs/Self Care Home Management;Biofeedback;Canalith Repostioning;Cryotherapy;Electrical Stimulation;Iontophoresis 4mg /ml Dexamethasone;Aquatic Therapy;Moist Heat;Traction;Ultrasound;Gait training;Stair training;Functional mobility training;Therapeutic activities;DME Instruction;Therapeutic exercise;Balance training;Neuromuscular re-education;Cognitive remediation;Patient/family education;Orthotic Fit/Training;Manual techniques;Passive range of motion;Energy conservation;Taping;Vestibular   PT Next Visit Plan Clamshell; DGI, LEFS, NME for L anterior tibialis, Walk-aide, progress balance, strengthening, and HEP; Consider sidelying hip abduction exercises   PT Home Exercise Plan Sit to stand, seated tband resisted clams, seated adduction squeeze   Consulted and Agree with Plan of Care Patient      Patient  will benefit from skilled therapeutic intervention in order to improve the following deficits and impairments:  Abnormal gait, Decreased balance, Decreased coordination, Decreased mobility, Decreased range of motion, Decreased strength, Difficulty walking, Impaired tone, Impaired UE functional use  Visit Diagnosis: Muscle weakness (generalized)  Other lack of coordination  Unsteadiness on feet     Problem List Patient Active Problem List   Diagnosis Date Noted  . 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 Neurocognitive disorder   . Left foot drop   . Benign essential HTN   . Hemiparesis affecting nondominant side as late effect of cerebrovascular accident (CVA) ([place]) 07/18/2016  . Right basal ganglia embolic stroke ([place]) [id number]  . CVA (cerebral infarction) 07/15/2016  . TIA (transient ischemic attack) 07/14/2016  . HTN, goal below 140/80 07/14/2016  . Left facial numbness 07/14/2016  . Weakness of left side of body 07/14/2016    Blythe Stanford, PT DPT 12/06/2016, 8:48 AM  [place] MAIN [facility]  SERVICES [address] [place], [place], [address] Phone: [phone]   Fax:  [phone]  Name: Nikash Bense MRN: [id number] Date of Birth: 1956/09/12

## 2016-12-06 NOTE — Therapy (Signed)
[place] MAIN [facility] SERVICES [address] [place], [place], [address] Phone: [phone]   Fax:  [phone]  Occupational Therapy Treatment  Patient Details  Name: Jason Oconnor MRN: [id number] Date of Birth: May 29, 1956 No Data Recorded  Encounter Date: 12/04/2016      OT End of Session - 12/05/16 2028    Visit Number 32   Number of Visits 48   Date for OT Re-Evaluation 01/24/17   Authorization Type 3 of 20 visits for 2018 calendar year   OT Start Time 0845   OT Stop Time 0931   OT Time Calculation (min) 46 min   Activity Tolerance Patient tolerated treatment well   Behavior During Therapy [name] for tasks assessed/performed      Past Medical History:  Diagnosis Date  . Bone marrow disease   . Hypertension     History reviewed. No pertinent surgical history.  There were no vitals filed for this visit.      Subjective Assessment - 12/05/16 2018    Subjective  Patient reports he is still having trouble with his left wrist, pain at times and difficulty with performing select tasks at home.    Pertinent History Patient reports on July 13, 2016 he was helping a friend doing home repairs and felt tired, went home and started to notice problems moving around.  The next morning he feel trying to put his slippers on, wife transported him to ER and [facility] for 3 days and then went to [facility] for REhab and was discharged this past Saturday to home.     Patient Stated Goals "I want to get back to close as normal as possible, play golf again, work in the garage."     Currently in Pain? Yes   Pain Score 2    Pain Location Wrist   Pain Orientation Left   Pain Descriptors / Indicators Aching   Pain Type Acute pain   Pain Frequency Intermittent                      OT Treatments/Exercises (OP) - 12/05/16 0901      Fine Motor Coordination   Other Fine Motor Exercises Patient seen for use of left hand to pick up push pins and  place into bulletin board on the table, cues for prehension patterns, primary focus in coordination but also worked towards finger strength to push and remove.  Tends to use a lateral pinch and requires more cues for a tip to tip grasp.  Grooved pegboard with cues, most difficulty with ones with the grooved end facing directly up.       Neurological Re-education Exercises   Other Exercises 1 Red theraband exercises for shoulder extension for 10 reps X 2 with cues for form.  ER with cues at elbow, pull downs, high rows, all for 10 reps X 2 with cues for technique, some adjustments needed at times for left grip on band (wraps around hand if needed).  ROM with overhead reaching with sticky notes on the wall, placing as high as he can and then trying to place the next one higher with repeated trials. Resistive wrist exercises with left wrist over wedge pulling resistive Velcro squares and replacing with cues. Wrist flexion, ext with 2#, radial/ulnar deviation with 2#, supination 1# then 2# all for 15 reps each.                 OT Education - 12/05/16 2027  Education provided Yes   Education Details wall exercises with reaching, red theraband   Person(s) Educated Patient   Methods Explanation;Demonstration;Verbal cues   Comprehension Verbal cues required;Returned demonstration;Verbalized understanding             OT Long Term Goals - 12/06/16 0921      OT LONG TERM GOAL #1   Title Patient to improve left UE coordination sufficient to tie shoes with modified independence.    Baseline unable   Time 12   Period Weeks   Status On-going     OT LONG TERM GOAL #2   Title Patient to improve LUE coordination to complete buttons, snaps and zippers on clothing with modified independence.   Time 12   Period Weeks   Status On-going     OT LONG TERM GOAL #3   Title Patient to complete bathing with modified independence including using left arm to wash right side of body.    Time 12    Period Weeks   Status Achieved     OT LONG TERM GOAL #4   Title Patient to improve left hand use to type on computer using both hands on home row keys, 2 lines with no errors in 5 minutes.    Baseline unable   Time 12   Period Weeks   Status On-going     OT LONG TERM GOAL #5   Title Patient will improve grip on left hand by 10# to stabilize jars and containers for opening.    Baseline unable   Time 12   Period Weeks   Status On-going     OT LONG TERM GOAL #6   Title Patient will be able to demonstrate sweeping with use of bilateral UEs with modified independence.    Baseline unable   Time 12   Period Weeks   Status Partially Met     OT LONG TERM GOAL #7   Title Patient will improve grip strength in left hand to be able to stabilize utensils to cut meat with modified independence.    Baseline unable    Time 12   Period Weeks   Status Partially Met     OT LONG TERM GOAL #8   Title Patient will be able to demonstrate increased strength in LUE to reach into microwave and retrieve plate of food with modified independence.   Baseline unable.    Time 12   Period Weeks   Status On-going     OT LONG TERM GOAL  #9   Baseline Patient will improve left UE control and reach to operate left turning signal in the car with modified independence.    Time 12   Period Weeks   Status Partially Met     OT LONG TERM GOAL  #10   TITLE Patient will be able to hold and manage large brush to wash his truck with modified independence.    Baseline unable   Time 12   Period Weeks   Status On-going               Plan - 12/05/16 2028    Clinical Impression Statement Patient continues to progress, demonstrates difficulty with tip to tip pinch during fine motor coordination tasks.  Movement patterns at the shoulder are still impaired, tends to compensate and uses a flexion/ABD winging pattern and requires cues to correct and adjust.  Continue to work towards goals to improve LUE  functional use for ADL/IADL tasks.    Rehab Potential  Good   OT Frequency 2x / week   OT Duration 12 weeks   OT Treatment/Interventions Self-care/ADL training;Therapeutic exercise;Neuromuscular education;Visual/perceptual remediation/compensation;Therapist, nutritional;Therapeutic exercises;Patient/family education;DME and/or AE instruction;Manual Therapy;Therapeutic activities;Balance training   Consulted and Agree with Plan of Care Patient      Patient will benefit from skilled therapeutic intervention in order to improve the following deficits and impairments:  Decreased knowledge of use of DME, Decreased coordination, Impaired sensation, Decreased activity tolerance, Decreased range of motion, Decreased strength, Decreased balance, Difficulty walking, Impaired UE functional use  Visit Diagnosis: Muscle weakness (generalized)  Other lack of coordination    Problem List Patient Active Problem List   Diagnosis Date Noted  . 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 Neurocognitive disorder   . Left foot drop   . Benign essential HTN   . Hemiparesis affecting nondominant side as late effect of cerebrovascular accident (CVA) ([place]) 07/18/2016  . Right basal ganglia embolic stroke ([place]) 47/31/9243  . CVA (cerebral infarction) 07/15/2016  . TIA (transient ischemic attack) 07/14/2016  . HTN, goal below 140/80 07/14/2016  . Left facial numbness 07/14/2016  . Weakness of left side of body 07/14/2016   [name] Oneita Jolly, OTR/L, CLT  [name],[name] 12/06/2016, 9:25 AM  [place] MAIN [facility] SERVICES [address] [place], [place], [address] Phone: [phone]   Fax:  [phone]  Name: Jason Oconnor MRN: [id number] Date of Birth: 11/22/1956

## 2016-12-07 NOTE — Therapy (Signed)
[place] MAIN [facility] SERVICES [address] [place], [place], [address] Phone: [phone]   Fax:  [phone]  Occupational Therapy Treatment  Patient Details  Name: Jason Oconnor MRN: [id number] Date of Birth: Feb 07, 1956 No Data Recorded  Encounter Date: 12/06/2016      OT End of Session - 12/06/16 1649    Visit Number 33   Number of Visits 27   Date for OT Re-Evaluation 01/24/17   Authorization Type 4 of 20 visits for 2018 calendar year   OT Start Time 0845   OT Stop Time 0933   OT Time Calculation (min) 48 min   Activity Tolerance Patient tolerated treatment well   Behavior During Therapy [facility] for tasks assessed/performed      Past Medical History:  Diagnosis Date  . Bone marrow disease   . Hypertension     History reviewed. No pertinent surgical history.  There were no vitals filed for this visit.      Subjective Assessment - 12/06/16 1646    Subjective  Patient reports he will be going to see the doctor next Tuesday for an update.  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    Pertinent History Patient reports on July 13, 2016 he was helping a friend doing home repairs and felt tired, went home and started to notice problems moving around.  The next morning he feel trying to put his slippers on, wife transported him to ER and [facility] for 3 days and then went to [facility] for REhab and was discharged this past Saturday to home.     Patient Stated Goals "I want to get back to close as normal as possible, play golf again, work in the garage."     Currently in Pain? No/denies   Pain Score 0-No pain                      OT Treatments/Exercises (OP) - 12/07/16 1735      Fine Motor Coordination   Other Fine Motor Exercises Patient seen for fine motor coordination tasks with [employer], manipulation  of washer, dowels and collars with emphasis on prehension patterns and ability to pick up individual pieces, assembly and disassembly with cues from therapist with left hand.  Patient was not able to perform this in the past since pieces were too small.  He still drops items frequently, slow to complete but is progressing.      Neurological Re-education Exercises   Other Exercises 1 Patient seen for left UE reaching tasks in multiple planes of motion with cues for movement patterns.  Shape tower in sitting this date with completion of all four levels moving items from one level to the next, cues to decrease shoulder movements, winging of left arm.                 OT Education - 12/06/16 1649    Education provided Yes   Education Details Encouraged patient to perform fine motor tasks at home, moving towards smaller objects less than 1/2 inch in size.     Person(s) Educated Patient   Methods Explanation;Demonstration;Verbal cues   Comprehension Verbal cues required;Returned demonstration;Verbalized understanding             OT Long Term Goals - 12/06/16 7622  OT LONG TERM GOAL #1   Title Patient to improve left UE coordination sufficient to tie shoes with modified independence.    Baseline unable   Time 12   Period Weeks   Status On-going     OT LONG TERM GOAL #2   Title Patient to improve LUE coordination to complete buttons, snaps and zippers on clothing with modified independence.   Time 12   Period Weeks   Status On-going     OT LONG TERM GOAL #3   Title Patient to complete bathing with modified independence including using left arm to wash right side of body.    Time 12   Period Weeks   Status Achieved     OT LONG TERM GOAL #4   Title Patient to improve left hand use to type on computer using both hands on home row keys, 2 lines with no errors in 5 minutes.    Baseline unable   Time 12   Period Weeks   Status On-going     OT LONG TERM GOAL #5   Title  Patient will improve grip on left hand by 10# to stabilize jars and containers for opening.    Baseline unable   Time 12   Period Weeks   Status On-going     OT LONG TERM GOAL #6   Title Patient will be able to demonstrate sweeping with use of bilateral UEs with modified independence.    Baseline unable   Time 12   Period Weeks   Status Partially Met     OT LONG TERM GOAL #7   Title Patient will improve grip strength in left hand to be able to stabilize utensils to cut meat with modified independence.    Baseline unable    Time 12   Period Weeks   Status Partially Met     OT LONG TERM GOAL #8   Title Patient will be able to demonstrate increased strength in LUE to reach into microwave and retrieve plate of food with modified independence.   Baseline unable.    Time 12   Period Weeks   Status On-going     OT LONG TERM GOAL  #9   Baseline Patient will improve left UE control and reach to operate left turning signal in the car with modified independence.    Time 12   Period Weeks   Status Partially Met     OT LONG TERM GOAL  #10   TITLE Patient will be able to hold and manage large brush to wash his truck with modified independence.    Baseline unable   Time 12   Period Weeks   Status On-going               Plan - 12/06/16 1650    Clinical Impression Statement Patient was able to progress to small items this date for fine motor coordination with left hand about 1/4 inch in size, cues to decrease compensatory movements at the shoulder but overall progressing well, no pain noted, just fatigue at times. Continue to work towards goals in [place] to improve LUE function for daily tasks.    Rehab Potential Good   OT Frequency 2x / week   OT Duration 12 weeks   OT Treatment/Interventions Self-care/ADL training;Therapeutic exercise;Neuromuscular education;Visual/perceptual remediation/compensation;Therapist, nutritional;Therapeutic exercises;Patient/family education;DME  and/or AE instruction;Manual Therapy;Therapeutic activities;Balance training   Consulted and Agree with Plan of Care Patient      Patient will benefit from skilled therapeutic intervention  in order to improve the following deficits and impairments:  Decreased knowledge of use of DME, Decreased coordination, Impaired sensation, Decreased activity tolerance, Decreased range of motion, Decreased strength, Decreased balance, Difficulty walking, Impaired UE functional use  Visit Diagnosis: Muscle weakness (generalized)  Other lack of coordination    Problem List Patient Active Problem List   Diagnosis Date Noted  . 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 Neurocognitive disorder   . Left foot drop   . Benign essential HTN   . Hemiparesis affecting nondominant side as late effect of cerebrovascular accident (CVA) ([place]) 07/18/2016  . Right basal ganglia embolic stroke ([place]) 93/11/2377  . CVA (cerebral infarction) 07/15/2016  . TIA (transient ischemic attack) 07/14/2016  . HTN, goal below 140/80 07/14/2016  . Left facial numbness 07/14/2016  . Weakness of left side of body 07/14/2016  Mical Brun Oneita Jolly, OTR/L, CLT  Rowena Moilanen 12/07/2016, 5:41 PM  [place] MAIN [facility] SERVICES [address] [place], [place], [address] Phone: [phone]   Fax:  [phone]  Name: Jason Oconnor MRN: [id number] Date of Birth: Apr 23, 1956

## 2016-12-10 ENCOUNTER — Encounter: Payer: Self-pay | Admitting: Physical Medicine & Rehabilitation

## 2016-12-10 ENCOUNTER — Ambulatory Visit (HOSPITAL_BASED_OUTPATIENT_CLINIC_OR_DEPARTMENT_OTHER): Payer: 59 | Admitting: Physical Medicine & Rehabilitation

## 2016-12-10 VITALS — BP 126/81 | HR 72 | Resp 14

## 2016-12-10 DIAGNOSIS — I69354 Hemiplegia and hemiparesis following cerebral infarction affecting left non-dominant side: Secondary | ICD-10-CM | POA: Insufficient documentation

## 2016-12-10 DIAGNOSIS — R279 Unspecified lack of coordination: Secondary | ICD-10-CM | POA: Diagnosis not present

## 2016-12-10 DIAGNOSIS — M21372 Foot drop, left foot: Secondary | ICD-10-CM | POA: Insufficient documentation

## 2016-12-10 DIAGNOSIS — R531 Weakness: Secondary | ICD-10-CM | POA: Insufficient documentation

## 2016-12-10 DIAGNOSIS — I69359 Hemiplegia and hemiparesis following cerebral infarction affecting unspecified side: Secondary | ICD-10-CM | POA: Diagnosis not present

## 2016-12-10 DIAGNOSIS — I639 Cerebral infarction, unspecified: Secondary | ICD-10-CM

## 2016-12-10 DIAGNOSIS — I10 Essential (primary) hypertension: Secondary | ICD-10-CM | POA: Diagnosis not present

## 2016-12-10 NOTE — Patient Instructions (Signed)
Referral to Neuropsych to eval cognitive issues as they relate to work activities  Take baclofen three times a day

## 2016-12-10 NOTE — Progress Notes (Signed)
Subjective:    Patient ID: Jason Oconnor, male    DOB: Mar 26, 1956, 61 y.o.   MRN: [id number] Right posterior basal ganglia infarct 07/14/2016 with residual left hemiparesis and facial droop. HPI  amb without AFO and AD at home, uses [place] and AFO outside of home ADL mod I Still receiving PT,OT, Outpatient therapy STD , has 1 more month available, patient and his wife are wondering whether he can return to work. [employer] has some light duty available. 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 We discussed that the location of the stroke typically does not cause significant cognitive dysfunction. He has been diagnosed with neurocognitive dysfunction post stroke which is apparent, more acutely Has not required speech therapy after discharge from inpatient on 08/03/2016  Pain Inventory Average Pain 2 Pain Right Now 1 My pain is intermittent and sharp  In the last 24 hours, has pain interfered with the following? General activity 2 Relation with others 0 Enjoyment of life 5 What TIME of day is your pain at its worst? night Sleep (in general) Fair  Pain is worse with: inactivity Pain improves with: rest and therapy/exercise Relief from Meds: 0  Mobility walk without assistance use a cane how many minutes can you walk? 20-30 ability to climb steps?  yes do you drive?  yes Do you have any goals in this area?  no  Function disabled: date disabled 07/14/16  Neuro/Psych weakness tremor spasms  Prior Studies Any changes since last visit?  no  Physicians involved in your care Any changes since last visit?  no   No family history on file. Social History   Social History  . Marital status: Married    Spouse name: N/A  . Number of children: N/A  . Years of education: N/A   Social History Main Topics  . Smoking status: Former Research scientist (life sciences)  . Smokeless tobacco: Never Used  . Alcohol use Yes       Comment: rare  . Drug use: No  . Sexual activity: Not Asked   Other Topics Concern  . None   Social History Narrative  . None   No past surgical history on file. Past Medical History:  Diagnosis Date  . Bone marrow disease   . Hypertension    BP 126/81   Pulse 72   Resp 14   SpO2 96%   Opioid Risk Score:   Fall Risk Score:  `1  Depression screen PHQ 2/9  Depression screen PHQ 2/9 08/13/2016  Decreased Interest 0  Down, Depressed, Hopeless 0  PHQ - 2 Score 0  Altered sleeping 0  Tired, decreased energy 1  Change in appetite 0  Feeling bad or failure about yourself  0  Trouble concentrating 0  Moving slowly or fidgety/restless 1  Suicidal thoughts 0  PHQ-9 Score 2  Difficult doing work/chores Not difficult at all     Review of Systems  Constitutional: Negative.   HENT: Negative.   Eyes: Negative.   Respiratory: Negative.   Cardiovascular: Negative.   Gastrointestinal: Negative.   Endocrine: Negative.   Genitourinary: Negative.   Musculoskeletal: Negative.   Skin: Negative.   Allergic/Immunologic: Negative.   Neurological: Negative.   Hematological: Negative.   Psychiatric/Behavioral: Negative.   All other systems reviewed and are negative.      Objective:   Physical Exam  Constitutional: He is oriented to person, place,  and time. He appears well-developed and well-nourished.  HENT:  Head: Normocephalic and atraumatic.  Eyes: Conjunctivae and EOM are normal. Pupils are equal, round, and reactive to light.  Neck: Normal range of motion. Neck supple.  Neurological: He is alert and oriented to person, place, and time. Gait abnormal.  Motor strength is 3 minus and left deltoid by stress. Grip. 4. At the left hip flexor, knee extensor, 3 minus of the left ankle dorsiflexor 5/5 in the right deltoid, bicep, tricep, grip, hip flexion, knee extension, ankle dorsi and plantar  Gait utilizes a cane as well as left AFO. Increased base of support  Skin:  Skin is warm and dry.  Psychiatric: He has a normal mood and affect. His speech is normal and behavior is normal. Judgment and thought content normal.  Nursing note and vitals reviewed.         Assessment & Plan:  1. History of right basal ganglia infarct August 2017. He is approaching the six-month mark post stroke and I expect his motor recovery to be plateauing. He will have some long-term impairments. He is able to drive and to perform sedentary work activities. I would not recommend ambulating on uneven surfaces on a daily basis. He cannot climb, he cannot squat. His left upper extremity usage is limited, he has very limited dexterity, which would affect typing speed. It is unclear on the patient's description what his actual light duty work requirements would be. There is also the question of any residual cognitive dysfunction. I discussed with patient and his wife that he has no obvious cognitive deficits. However, neuropsychology testing would be able to provide a more detailed assessment of his strengths and weaknesses as they may relate to work activities. In regards to long-term disability benefits, I referred the patient to his human resources department to discuss the details of this plan. Return to clinic one month  Over half of the 25 min visit was spent counseling and coordinating care.

## 2016-12-11 ENCOUNTER — Ambulatory Visit: Payer: 59

## 2016-12-11 ENCOUNTER — Ambulatory Visit: Payer: 59 | Admitting: Occupational Therapy

## 2016-12-18 ENCOUNTER — Ambulatory Visit: Payer: 59 | Admitting: Occupational Therapy

## 2016-12-18 ENCOUNTER — Encounter: Payer: Self-pay | Admitting: Occupational Therapy

## 2016-12-18 ENCOUNTER — Ambulatory Visit: Payer: 59

## 2016-12-18 DIAGNOSIS — M6281 Muscle weakness (generalized): Secondary | ICD-10-CM

## 2016-12-18 DIAGNOSIS — R278 Other lack of coordination: Secondary | ICD-10-CM

## 2016-12-18 DIAGNOSIS — R2681 Unsteadiness on feet: Secondary | ICD-10-CM

## 2016-12-18 NOTE — Therapy (Signed)
[place] MAIN [facility] SERVICES [address] [place], [place], [address] Phone: [phone]   Fax:  [phone]  Physical Therapy Treatment  Patient Details  Name: Jason Oconnor MRN: [id number] Date of Birth: May 11, 1956 Referring Provider: Dr. Naaman Plummer  Encounter Date: 12/18/2016      PT End of Session - 12/18/16 1258    Visit Number 33   Number of Visits 53   Date for PT Re-Evaluation 02/12/17   Authorization Type no g codes   PT Start Time 0800   PT Stop Time 0845   PT Time Calculation (min) 45 min   Equipment Utilized During Treatment Gait belt   Activity Tolerance Patient tolerated treatment well   Behavior During Therapy [facility] for tasks assessed/performed      Past Medical History:  Diagnosis Date  . Bone marrow disease   . Hypertension     History reviewed. No pertinent surgical history.  There were no vitals filed for this visit.      Subjective Assessment - 12/18/16 1256    Subjective Patient reports he continues to have difficulty with walking and reports he does not feel like he's back to normal.    Patient is accompained by: --  wife   Pertinent History Patient is a 62 year old right-handed male with history of hypertension. Per chart review patient is married. 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 Presented to [facility] 07/14/2016 with left-sided weakness and facial droop. Blood pressure elevated 170/106. Head CT scan negative. 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 CTA of the head showed no emergent large vessel occlusion or severe stenosis. Carotid Dopplers with no ICA stenosis. Patient did not receive TPA. Echocardiogram with ejection fraction of 60%. Normal systolic function. Placed on aspirin and Plavix for CVA prophylaxis. Subcutaneous  Lovenox for DVT prophylaxis.  Patient transferred to [facility] on 07/16/2016. Pt discharged form [facility] on 08/03/16. Pt reports that he made progress at [facility]. No reported problems since returning home. Currently using a wide base quad cane in RUE and L AFO. Pt reports that he has made the last progress with respect to his LUE strength.    Patient Stated Goals "Get back as close as I can to normal." Pt would to be able to play golf. Pt would like to return to some kind of work.    Currently in Pain? No/denies          TREATMENT:  Modalities  NMES pulse width 380uS, Intensity 44, Rate: 50Hz , waveform: asymmetrical 8 minutes total time. 15 sec on/ 30 sec off against a 10 # weight with patient in sitting to improve motor recruitment. Active ankle Dorsiflexion performed throughout entirety of motion.   Therapeutic Exercise: Tandem ambulation -- 67ft x 2 Backward tandem ambulation -- 29ft x 2 Ambulation eyes closed forward/backwards-58ft x 2 Obstacle course stepping over blocks and around cones - x2 down/back Head turns ambulation - up/down; left/right x38ft Stool scoots around gym - 168ft Tandem stance B - 2 x 30sec Single leg stance  -- 2 x 30sec ( Requires frequent UE support for L LE) Observation:  L Peroneal -- 1/5 MMT; with trace palpable contact of the muscular  DGI: 23/24       PT Education - 12/18/16 1258    Education provided Yes   Education Details Educated on POC, HEP, and  expected outcomes   Person(s) Educated Patient   Methods Explanation;Demonstration   Comprehension Verbalized understanding;Returned demonstration             PT Long Term Goals - 12/18/16 1648      PT LONG TERM GOAL #1   Title Pt will be independent with HEP in order to improve strength and balance in order to decrease fall risk and improve function at home and work.   Baseline 10/30/16: Mod-min A for exericise progression/ performance 12/18/16: Requires minA for technique with exercise   Time 6   Period  Weeks   Status On-going     PT LONG TERM GOAL #2   Title Pt will improve BERG by at least 3 points in order to demonstrate clinically significant improvement in balance.   Baseline 08/07/16: 49/56 10/02/16: 53/56   Time 8   Period Weeks   Status Achieved     PT LONG TERM GOAL #3   Title Pt will decrease TUG to below 14 seconds in order to demonstrate decreased fall risk    Baseline 08/07/16: 21.9 seconds; 10/02/16: 12.3    Time 8   Period Weeks   Status Achieved     PT LONG TERM GOAL #4   Title Pt will decrease 5TSTS by at least 3 seconds in order to demonstrate clinically significant improvement in LE strength   Baseline 08/07/16: 18.5 seconds; 10/02/16: 14.3   Time 8   Period Weeks   Status Achieved     PT LONG TERM GOAL #5   Title Pt will increase 10MWT by at least 0.13 m/s in order to demonstrate clinically significant improvement in community ambulation   Baseline 08/07/16: 0.53 m/s 10/02/16: .97 m/s   Time 8   Period Weeks   Status Achieved     Additional Long Term Goals   Additional Long Term Goals Yes     PT LONG TERM GOAL #6   Title Pt will be able to swing a golf club safely in order to complete 9 holes of golf   Baseline 10/30/16: Unable to swing golf club safely 12/19/15: unable to swing golf club   Time 6   Period Weeks   Status On-going     PT LONG TERM GOAL #7   Title Pt will improve 6MWT by at least 175ft  to demonstrate improved aerobic capacity/endurance   Baseline 582 ft 10/02/16: 1189ft   Time 7   Period Weeks   Status Achieved     PT LONG TERM GOAL #8   Title Patient will improve DGI by 4 points to demonstrate improvement in dynamic gait/balance and decrease fall risk   Baseline 10/02/16: 19/24; 10/30/16: 21/24 12/18/16: 23/24   Time 6   Period Weeks   Status Achieved     PT LONG TERM GOAL  #9   TITLE Patient will demonstrate ability to perform single leg stance for >10 sec bilaterally to better be able to climb ladders   Baseline L LE SLS: 3 sec    Time 8   Period Weeks   Status New     PT LONG TERM GOAL  #10   TITLE Patient will be able to tandem walk 65ft without loss of balance to better perform dynamic balance activities to perform occupational duties.   Baseline Requires stepping responses to maintain balance with tandem walking    Time 8   Period Weeks   Status New  Plan - 12/18/16 1300    Clinical Impression Statement Patient demonstrates improvement in dynamic balance and functional strength as indicated by improvement in DGI and ambulation ability. Although patient is improving, he conintues to have difficulties with higher level dynamic/static balance activities (which is necessary for returning to occupational tasks) and tasks with single leg stance secondary to decreased peroneal muscular activation (1/5 for MMT on the LLE); patient will benefit from further skilled therapy to return to prior level of function.    Rehab Potential Good   Clinical Impairments Affecting Rehab Potential Positive: motivation, prior functional level; Negative: no active dorsiflexion, increased LUE tone   PT Frequency 2x / week   PT Duration 6 weeks   PT Treatment/Interventions ADLs/Self Care Home Management;Biofeedback;Canalith Repostioning;Cryotherapy;Electrical Stimulation;Iontophoresis 4mg /ml Dexamethasone;Aquatic Therapy;Moist Heat;Traction;Ultrasound;Gait training;Stair training;Functional mobility training;Therapeutic activities;DME Instruction;Therapeutic exercise;Balance training;Neuromuscular re-education;Cognitive remediation;Patient/family education;Orthotic Fit/Training;Manual techniques;Passive range of motion;Energy conservation;Taping;Vestibular   PT Next Visit Plan Clamshell; DGI, LEFS, NME for L anterior tibialis, Walk-aide, progress balance, strengthening, and HEP; Consider sidelying hip abduction exercises   PT Home Exercise Plan Sit to stand, seated tband resisted clams, seated adduction squeeze   Consulted  and Agree with Plan of Care Patient      Patient will benefit from skilled therapeutic intervention in order to improve the following deficits and impairments:  Abnormal gait, Decreased balance, Decreased coordination, Decreased mobility, Decreased range of motion, Decreased strength, Difficulty walking, Impaired tone, Impaired UE functional use  Visit Diagnosis: Muscle weakness (generalized)  Other lack of coordination  Unsteadiness on feet     Problem List Patient Active Problem List   Diagnosis Date Noted  . 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 Neurocognitive disorder   . Left foot drop   . Benign essential HTN   . Hemiparesis affecting nondominant side as late effect of cerebrovascular accident (CVA) ([place]) 07/18/2016  . Right basal ganglia embolic stroke ([place]) [id number]  . CVA (cerebral infarction) 07/15/2016  . TIA (transient ischemic attack) 07/14/2016  . HTN, goal below 140/80 07/14/2016  . Left facial numbness 07/14/2016  . Weakness of left side of body 07/14/2016    Blythe Stanford, PT DPT 12/18/2016, 5:00 PM  [place] MAIN [facility] SERVICES [address] [place], [place], [address] Phone: [phone]   Fax:  [phone]  Name: Jason Oconnor MRN: [id number] Date of Birth: 1956-07-29

## 2016-12-18 NOTE — Therapy (Signed)
[place] MAIN [facility] SERVICES [address] [place], [place], [address] Phone: [phone]   Fax:  [phone]  Occupational Therapy Treatment  Patient Details  Name: Jason Oconnor MRN: [id number] Date of Birth: 1956/08/11 No Data Recorded  Encounter Date: 12/18/2016      OT End of Session - 12/18/16 0857    Visit Number 34   Number of Visits 22   Date for OT Re-Evaluation 01/24/17   Authorization Type 5 of 20 visits for 2018 calendar year   OT Start Time 0845   OT Stop Time 0927   OT Time Calculation (min) 42 min   Activity Tolerance Patient tolerated treatment well   Behavior During Therapy [facility] for tasks assessed/performed      Past Medical History:  Diagnosis Date  . Bone marrow disease   . Hypertension     History reviewed. No pertinent surgical history.  There were no vitals filed for this visit.      Subjective Assessment - 12/18/16 0851    Subjective  Pt. reports he can see small changes. Pt. reports the Doctor wants him to take the Baclofen at night all the time, and his wife doesn't want him to. pt. reports he doesn't know what to do.   Pertinent History Patient reports on July 13, 2016 he was helping a friend doing home repairs and felt tired, went home and started to notice problems moving around.  The next morning he feel trying to put his slippers on, wife transported him to ER and [facility] for 3 days and then went to [facility] for REhab and was discharged this past Saturday to home.     Patient Stated Goals "I want to get back to close as normal as possible, play golf again, work in the garage."     Currently in Pain? No/denies   Pain Score 7    Pain Location Shoulder   Pain Orientation Left   Pain Descriptors / Indicators Sharp                      OT Treatments/Exercises (OP) - 12/18/16 7494      Fine Motor Coordination   Other Fine Motor Exercises Pt. performed [facility] tasks using the Grooved  pegboard. Pt. worked on grasping the grooved pegs from a horizontal position, and moving the pegs to a vertical position in the hand to prepare for placing them in the grooved slot.  Pt. Has difficulty turning the grooved pegs in his hand. Pt. Required cues, and repositioning of the peg. Pt. Worked on removing the pegs with alternating thumb opposition to the tip of the 2nd through 4th digits. Pt. Initially attempted to remove pegs attempting to grasp and store pegs.     Visual/Perceptual Exercises   Other Exercises Pt. worked on reaching with his LUE using the shape tower with progressively increasing the height of the reach. Pt. Required increased time, and  Repositioning of the left hand when grasping the shapes. Pt. Compensates with the trunk when grasping the shapes. Pt. Performed red theraband exercises with the LUE for shoulder flexion, horizontal abduction, and retraction. 2# dumbbell ex. for elbow flexion and extension, forearm supination/pronation, wrist flexion/extension, and radial deviation. Pt. requires rest breaks and verbal cues for proper technique.                OT Education - 12/18/16 0916    Education Details LUE functioing, reaching, and [place].   Person(s) Educated  Patient   Methods Explanation;Demonstration;Verbal cues   Comprehension Verbalized understanding;Returned demonstration             OT Long Term Goals - 12/06/16 6387      OT LONG TERM GOAL #1   Title Patient to improve left UE coordination sufficient to tie shoes with modified independence.    Baseline unable   Time 12   Period Weeks   Status On-going     OT LONG TERM GOAL #2   Title Patient to improve LUE coordination to complete buttons, snaps and zippers on clothing with modified independence.   Time 12   Period Weeks   Status On-going     OT LONG TERM GOAL #3   Title Patient to complete bathing with modified independence including using left arm to wash right side of body.    Time 12    Period Weeks   Status Achieved     OT LONG TERM GOAL #4   Title Patient to improve left hand use to type on computer using both hands on home row keys, 2 lines with no errors in 5 minutes.    Baseline unable   Time 12   Period Weeks   Status On-going     OT LONG TERM GOAL #5   Title Patient will improve grip on left hand by 10# to stabilize jars and containers for opening.    Baseline unable   Time 12   Period Weeks   Status On-going     OT LONG TERM GOAL #6   Title Patient will be able to demonstrate sweeping with use of bilateral UEs with modified independence.    Baseline unable   Time 12   Period Weeks   Status Partially Met     OT LONG TERM GOAL #7   Title Patient will improve grip strength in left hand to be able to stabilize utensils to cut meat with modified independence.    Baseline unable    Time 12   Period Weeks   Status Partially Met     OT LONG TERM GOAL #8   Title Patient will be able to demonstrate increased strength in LUE to reach into microwave and retrieve plate of food with modified independence.   Baseline unable.    Time 12   Period Weeks   Status On-going     OT LONG TERM GOAL  #9   Baseline Patient will improve left UE control and reach to operate left turning signal in the car with modified independence.    Time 12   Period Weeks   Status Partially Met     OT LONG TERM GOAL  #10   TITLE Patient will be able to hold and manage large brush to wash his truck with modified independence.    Baseline unable   Time 12   Period Weeks   Status On-going               Plan - 12/18/16 5643    Clinical Impression Statement Pt. is making progress with isolating Left shoulder shoulder movements during functional reaching. Pt. has pain in the left shoulder initially. Pt. continues to have difficulty performing translatory movements with the grooved pegs on the grooved pegboard. Pt. was able to complete the first to rows without difficulty,  however pt. had more difficulty as he fatigues. Pt. does require cues for compensating proximally during [facility] tasks. Pt. conitnues to benefit from skilled OT services to improve LUE functioning, and  [facility] skills for use during ADLs/IADLs.   Rehab Potential Good   OT Frequency 2x / week   OT Duration 12 weeks   OT Treatment/Interventions Self-care/ADL training;Therapeutic exercise;Neuromuscular education;Visual/perceptual remediation/compensation;Therapist, nutritional;Therapeutic exercises;Patient/family education;DME and/or AE instruction;Manual Therapy;Therapeutic activities;Balance training   Consulted and Agree with Plan of Care Patient      Patient will benefit from skilled therapeutic intervention in order to improve the following deficits and impairments:  Decreased knowledge of use of DME, Decreased coordination, Impaired sensation, Decreased activity tolerance, Decreased range of motion, Decreased strength, Decreased balance, Difficulty walking, Impaired UE functional use  Visit Diagnosis: Muscle weakness (generalized)  Other lack of coordination    Problem List Patient Active Problem List   Diagnosis Date Noted  . 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 Neurocognitive disorder   . Left foot drop   . Benign essential HTN   . Hemiparesis affecting nondominant side as late effect of cerebrovascular accident (CVA) ([place]) 07/18/2016  . Right basal ganglia embolic stroke ([place]) 81/82/9937  . CVA (cerebral infarction) 07/15/2016  . TIA (transient ischemic attack) 07/14/2016  . HTN, goal below 140/80 07/14/2016  . Left facial numbness 07/14/2016  . Weakness of left side of body 07/14/2016    Harrel Carina, MS, OTR/L 12/18/2016, 9:34 AM  [place] MAIN [facility] SERVICES [address] [place], [place], [address] Phone: [phone]   Fax:  [phone]  Name: Jason Oconnor MRN: [id number] Date of Birth: [birth year]

## 2016-12-18 NOTE — Patient Instructions (Addendum)
OT TREATMENT    Neuro muscular re-education:  Pt. performed [facility] tasks using the Grooved pegboard. Pt. worked on grasping the grooved pegs from a horizontal position, and moving the pegs to a vertical position in the hand to prepare for placing them in the grooved slot.  Pt. Has difficulty turning the grooved pegs in his hand. Pt. Required cues, and repositioning of the peg. Pt. Worked on removing the pegs with alternating thumb opposition to the tip of the 2nd through 4th digits. Pt. Initially attempted to remove pegs attempting to grasp and store pegs.  Therapeutic Exercise:  Pt. worked on reaching with his LUE using the shape tower with progressively increasing the height of the reach. Pt. Required increased time, and  Repositioning of the left hand when grasping the shapes. Pt. Compensates with the trunk when grasping the shapes. Pt. Performed red theraband exercises with the LUE for shoulder flexion, horizontal abduction, and retraction. 2# dumbbell ex. for elbow flexion and extension, forearm supination/pronation, wrist flexion/extension, and radial deviation. Pt. requires rest breaks and verbal cues for proper technique.

## 2016-12-20 ENCOUNTER — Encounter: Payer: 59 | Attending: Physical Medicine & Rehabilitation | Admitting: Psychology

## 2016-12-20 ENCOUNTER — Ambulatory Visit: Payer: 59

## 2016-12-20 ENCOUNTER — Encounter: Payer: Self-pay | Admitting: Occupational Therapy

## 2016-12-20 ENCOUNTER — Ambulatory Visit: Payer: 59 | Admitting: Occupational Therapy

## 2016-12-20 DIAGNOSIS — R531 Weakness: Secondary | ICD-10-CM | POA: Diagnosis not present

## 2016-12-20 DIAGNOSIS — I69359 Hemiplegia and hemiparesis following cerebral infarction affecting unspecified side: Secondary | ICD-10-CM | POA: Diagnosis not present

## 2016-12-20 DIAGNOSIS — I69398 Other sequelae of cerebral infarction: Secondary | ICD-10-CM

## 2016-12-20 DIAGNOSIS — M6281 Muscle weakness (generalized): Secondary | ICD-10-CM

## 2016-12-20 DIAGNOSIS — R2681 Unsteadiness on feet: Secondary | ICD-10-CM

## 2016-12-20 DIAGNOSIS — R278 Other lack of coordination: Secondary | ICD-10-CM

## 2016-12-20 DIAGNOSIS — I69319 Unspecified symptoms and signs involving cognitive functions following cerebral infarction: Secondary | ICD-10-CM | POA: Diagnosis not present

## 2016-12-20 DIAGNOSIS — I639 Cerebral infarction, unspecified: Secondary | ICD-10-CM | POA: Diagnosis not present

## 2016-12-20 DIAGNOSIS — R269 Unspecified abnormalities of gait and mobility: Secondary | ICD-10-CM

## 2016-12-20 NOTE — Therapy (Signed)
[place] MAIN [facility] SERVICES [address] [place], [place], [address] Phone: [phone]   Fax:  [phone]  Occupational Therapy Treatment  Patient Details  Name: Jason Oconnor MRN: [id number] Date of Birth: [birth year] No Data Recorded  Encounter Date: 12/20/2016      OT End of Session - 12/20/16 1013    Visit Number 35   Number of Visits 79   Date for OT Re-Evaluation 01/24/17   Authorization Type 6 of 20 visits for 2018 calendar year   OT Start Time 0845   OT Stop Time 0931   OT Time Calculation (min) 46 min   Activity Tolerance Patient tolerated treatment well   Behavior During Therapy [facility] for tasks assessed/performed      Past Medical History:  Diagnosis Date  . Bone marrow disease   . Hypertension     History reviewed. No pertinent surgical history.  There were no vitals filed for this visit.      Subjective Assessment - 12/20/16 0851    Subjective  Patient reports he has a busy day today, lots of appointments, has an appt with neuro psychologist in [place] today.     Pertinent History Patient reports on July 13, 2016 he was helping a friend doing home repairs and felt tired, went home and started to notice problems moving around.  The next morning he feel trying to put his slippers on, wife transported him to ER and [facility] for 3 days and then went to [facility] and was discharged this past Saturday to home.     Patient Stated Goals "I want to get back to close as normal as possible, play golf again, work in the garage."     Currently in Pain? Yes   Pain Score 5    Pain Location Shoulder   Pain Orientation Left   Pain Descriptors / Indicators Aching   Pain Type Acute pain   Pain Onset 1 to 4 weeks ago   Pain Frequency Intermittent   Aggravating Factors  pain with select activities                       OT Treatments/Exercises (OP) - 12/20/16 5789      Fine Motor Coordination   Other Fine  Motor Exercises Patient seen for fine motor coordination task of manipulation of coins flat from tabletop, now able to pick up without sliding each coin to the edge.  Cues for prehension patterns for picking up from flat surface.       Neurological Re-education Exercises   Other Exercises 1 Patient seen for LUE strengthening LUE with reciprocal UBE for 6 minutes total, 3 forwards/3 backwards with resistance of 3.3 to 3.5 with therapist in constant attendance to adjust resistance and ensure grip.  Patient required readjustment of grip 3 times during task.  Exercises in standing for large ball on the wall, both hands pushing ball up and down wall, then bouncing ball to work towards reaction time, 10 reps arms at 1/2 range of full reach.  Attempted medium ball but had difficulty with bounce due to decreased control and reaction time.  Was able to complete wrist movements with small up and down, side to side movements.  Small ball palming and cupping ball, touching floor then up to ceiling for ROM, balance combined, therapist provided cues and CGA to SBA.   Other Exercises 2 Juxtaciser for wrist motion, cues to keep secure grip on device.  Patient seen for left wrist flexion/extension 1# and 2# weight over wedge with cues.  Ulnar and radial deviation with cues for technique, all exercises 2 sets of 10.                 OT Education - 12/20/16 1013    Education provided Yes   Education Details ball on the wall for coordination and reaction time with cues.    Person(s) Educated Patient   Methods Explanation;Demonstration;Verbal cues   Comprehension Verbal cues required;Returned demonstration;Verbalized understanding             OT Long Term Goals - 12/06/16 0921      OT LONG TERM GOAL #1   Title Patient to improve left UE coordination sufficient to tie shoes with modified independence.    Baseline unable   Time 12   Period Weeks   Status On-going     OT LONG TERM GOAL #2   Title  Patient to improve LUE coordination to complete buttons, snaps and zippers on clothing with modified independence.   Time 12   Period Weeks   Status On-going     OT LONG TERM GOAL #3   Title Patient to complete bathing with modified independence including using left arm to wash right side of body.    Time 12   Period Weeks   Status Achieved     OT LONG TERM GOAL #4   Title Patient to improve left hand use to type on computer using both hands on home row keys, 2 lines with no errors in 5 minutes.    Baseline unable   Time 12   Period Weeks   Status On-going     OT LONG TERM GOAL #5   Title Patient will improve grip on left hand by 10# to stabilize jars and containers for opening.    Baseline unable   Time 12   Period Weeks   Status On-going     OT LONG TERM GOAL #6   Title Patient will be able to demonstrate sweeping with use of bilateral UEs with modified independence.    Baseline unable   Time 12   Period Weeks   Status Partially Met     OT LONG TERM GOAL #7   Title Patient will improve grip strength in left hand to be able to stabilize utensils to cut meat with modified independence.    Baseline unable    Time 12   Period Weeks   Status Partially Met     OT LONG TERM GOAL #8   Title Patient will be able to demonstrate increased strength in LUE to reach into microwave and retrieve plate of food with modified independence.   Baseline unable.    Time 12   Period Weeks   Status On-going     OT LONG TERM GOAL  #9   Baseline Patient will improve left UE control and reach to operate left turning signal in the car with modified independence.    Time 12   Period Weeks   Status Partially Met     OT LONG TERM GOAL  #10   TITLE Patient will be able to hold and manage large brush to wash his truck with modified independence.    Baseline unable   Time 12   Period Weeks   Status On-going               Plan - 12/20/16 1014    Clinical Impression Statement  Patient  continues to progress with LUE functional use.  Was able to open ice cream sandwich and feed self whole sandwich with use of left hand yesterday.  Progressing with left shoulder however still has decreased motion, coordination and reaction time with left UE during tasks. Was able to progress to picking up most coins from flat surface rather than sliding to the edge this date.  Continue towards goals in POC to improve LUE use in necessary daily tasks.    Rehab Potential Good   OT Frequency 2x / week   OT Duration 12 weeks   OT Treatment/Interventions Self-care/ADL training;Therapeutic exercise;Neuromuscular education;Visual/perceptual remediation/compensation;Therapist, nutritional;Therapeutic exercises;Patient/family education;DME and/or AE instruction;Manual Therapy;Therapeutic activities;Balance training   Consulted and Agree with Plan of Care Patient      Patient will benefit from skilled therapeutic intervention in order to improve the following deficits and impairments:  Decreased knowledge of use of DME, Decreased coordination, Impaired sensation, Decreased activity tolerance, Decreased range of motion, Decreased strength, Decreased balance, Difficulty walking, Impaired UE functional use  Visit Diagnosis: Muscle weakness (generalized)  Other lack of coordination    Problem List Patient Active Problem List   Diagnosis Date Noted  . 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 Neurocognitive disorder   . Left foot drop   . Benign essential HTN   . Hemiparesis affecting nondominant side as late effect of cerebrovascular accident (CVA) ([place]) 07/18/2016  . Right basal ganglia embolic stroke ([place]) 88/91/6945  . CVA (cerebral infarction) 07/15/2016  . TIA (transient ischemic attack) 07/14/2016  . HTN, goal below 140/80 07/14/2016  . Left facial numbness 07/14/2016  . Weakness of left side of body 07/14/2016   Errin Chewning Oneita Jolly, OTR/L, CLT  Giuseppina Quinones 12/20/2016, 10:18  AM  [place] MAIN [facility] SERVICES [address] [place], [place], [address] Phone: [phone]   Fax:  [phone]  Name: Ramal Eckhardt MRN: [id number] Date of Birth: Sep 22, 1956

## 2016-12-20 NOTE — Progress Notes (Addendum)
Patient:   Jason Oconnor   DOB:   03/05/1956  MR Number:  [id number]  Location:  [place] [facility] [address], [place] [id number] [place] [address] Dept: [phone]           Date of Service:   12/20/2016     Start Time:   11 AM End Time:   12 PM  Provider/Observer:  Edgardo Roys PSYD       Billing Code/Service: [phone]  Chief Complaint:     Chief Complaint  Patient presents with  . Headache  . Leg Pain  . Other    left arm weakness    Reason for Service:  The patient was referred by Alysia Penna, MD for neuropsychological assessment considerations. The patient had a right side basal ganglia infarct On 07/14/2016 causing left hemiparesis. This is almost exclusively created motor weakness and coordination issues. The patient has residual left upper extremity and left lower extremity weakness.  The patient's wife has reported in the past that she does not feel he is as cognitively sharp as he was before this stroke event. He does have a prior diagnosis of a neurocognitive dysfunction post stroke.  Current Status:  The patient reported during the clinical interview today that his cognitive functioning at this point is essentially like it was before with the exception of some issues with short-term memory, although he reports that even that is essentially what was happening before the stroke. The patient reports that he recently met with his employer who is told him that he is not going to be able to return to his previous job and he is not likely to continue working. His employer and the long-term disability carrier at work together for him going on to long-term disability. The patient did report that he had been having some visual spatial issues [place] and just figuring out how to coordinate when doing things such as driving but these of got much better. He reports that he is  essentially gotten back to baseline with the exception of motor weakness and coordination issues on the left side of his body.   Reliability of Information: Information is provided by the patient as well as review of available medical records. Information does appear to be valid and reliable.  Behavioral Observation: Yarden Hillis  presents as a 61 y.o.-year-old Right Caucasian Male who appeared his stated age. his dress was Appropriate and he was Well Groomed and his manners were Appropriate to the situation.  his participation was indicative of Appropriate behaviors.  There were  physical disabilities noted. He clearly has left arm weakness and left leg weakness. There were noticeable gait issues and coordination/control issues with his left arm and hand. The patient describes significant stiffness when getting up at the end of the clinical interview.   he displayed an appropriate level of cooperation and motivation.     Interactions:    Active Appropriate  Attention:   within normal limits and attention span and concentration were age appropriate  Memory:   within normal limits; recent and remote memory intactrecent   Visuo-spatial:  within normal limits  Speech (Volume):  normal  Speech:   normal; normal pitch  Thought Process:  Coherent  Though Content:  WNL; there were no indications of delusions or hallucinations or any other significant disturbance in thought content.   Orientation:   person, place, time/date and situation  Judgment:  Good  Planning:   Good  Affect:    Appropriate  Mood:    There were no indications are reports of significant issues of depression or anxiety. The patient did acknowledge that he initially had some moments where he had difficulty coping with the loss of function but this is becoming normalized and the patient is dealing with the loss of motor functioning appropriate to the situation.  Insight:   Good  Intelligence:   normal  Substance  Use:  No concerns of substance abuse are reported.    Medical History:   Past Medical History:  Diagnosis Date  . Bone marrow disease   . Hypertension         Outpatient Encounter Prescriptions as of 12/20/2016  Medication Sig  . acetaminophen (TYLENOL) 325 MG tablet Take 2 tablets (650 mg total) by mouth every 6 (six) hours as needed for mild pain (or Fever >/= 101).  Marland Kitchen amLODipine (NORVASC) 5 MG tablet Take 1 tablet (5 mg total) by mouth every evening.  . baclofen (LIORESAL) 10 MG tablet Take 10 mg by mouth 3 (three) times daily.  . Cholecalciferol (VITAMIN D3) 1000 units CAPS Take 1,000 Units by mouth every morning.  . clopidogrel (PLAVIX) 75 MG tablet Take 1 tablet (75 mg total) by mouth daily.  Marland Kitchen ezetimibe (ZETIA) 10 MG tablet Take 10 mg by mouth daily.  . pantoprazole (PROTONIX) 40 MG tablet Take 1 tablet (40 mg total) by mouth daily.  Marland Kitchen tiZANidine (ZANAFLEX) 2 MG tablet Take 1 tablet (2 mg total) by mouth at bedtime.   No facility-administered encounter medications on file as of 12/20/2016.           Sexual History:   History  Sexual Activity  . Sexual activity: Not on file     Family Med/Psych History: No family history on file.  Risk of Suicide/Violence: virtually non-existent   Impression/DX:  The patient appears to be doing fairly well with regard to adjustment to the loss of motor functioning following his stroke. The patient acknowledges some adjustment difficulties at the beginning. However, he denies any cognitive residual changes. The patient reported that he really does not want to go through any further testing or interventions from a neuropsychological standpoint.  Disposition/Plan:  The patient reports that he is not really interested in further neuropsychological assessment and he feels like he has returned to baseline. While medical records suggest that his wife is somewhat concerned that there are some changes in his cognitive functioning post CVA the patient  reports that he is doing well from that regard and really would like to not do any neuropsychological testing. I did talk with the patient about my availability here if issues of frustration, depression, anxiety, or stress begin to develop with some of the significant life changes that he is facing. I would be happy to see him for any type of counseling or testing that might be needed in the future but at this point the patient declined.  Diagnosis:    Right basal ganglia embolic stroke (HCC)  Hemiparesis affecting nondominant side as late effect of cerebrovascular accident (CVA) ([place])  Gait disturbance, post-stroke  Cognitive deficit following cerebrovascular accident (CVA)         Electronically Signed   _______________________ Ilean Skill, Psy.D.

## 2016-12-20 NOTE — Therapy (Signed)
[place] MAIN [facility] SERVICES [address] [place], [place], [address] Phone: [phone]   Fax:  [phone]  Physical Therapy Treatment  Patient Details  Name: Jason Oconnor MRN: [id number] Date of Birth: 1955/12/19 Referring Provider: Dr. Naaman Plummer  Encounter Date: 12/20/2016      PT End of Session - 12/20/16 0819    Visit Number 34   Number of Visits 53   Date for PT Re-Evaluation 02/12/17   Authorization Type no g codes   PT Start Time 0800   PT Stop Time 0845   PT Time Calculation (min) 45 min   Equipment Utilized During Treatment Gait belt   Activity Tolerance Patient tolerated treatment well   Behavior During Therapy [facility] for tasks assessed/performed      Past Medical History:  Diagnosis Date  . Bone marrow disease   . Hypertension     History reviewed. No pertinent surgical history.  There were no vitals filed for this visit.      Subjective Assessment - 12/20/16 0810    Subjective Patient reports he's been having less difficulty walking as has been able to more easily progress the foot forward when walking. 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    Patient is accompained by: --  wife   Pertinent History Patient is a 61 year old right-handed male with history of hypertension. Per chart review patient is married. 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 Presented to [facility] 07/14/2016 with left-sided weakness and facial droop. Blood pressure elevated 170/106. Head CT scan negative. 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 CTA of the head showed no emergent large vessel occlusion or severe stenosis. Carotid Dopplers with no ICA stenosis. Patient did not receive TPA. Echocardiogram with ejection  fraction of 60%. Normal systolic function. Placed on aspirin and Plavix for CVA prophylaxis. Subcutaneous Lovenox for DVT prophylaxis.  Patient transferred to CIR on 07/16/2016. Pt discharged form CIR on 08/03/16. Pt reports that he made progress at CIR. No reported problems since returning home. Currently using a wide base quad cane in RUE and L AFO. Pt reports that he has made the last progress with respect to his LUE strength.    Patient Stated Goals "Get back as close as I can to normal." Pt would to be able to play golf. Pt would like to return to some kind of work.    Currently in Pain? No/denies      TREATMENT:  Modalities  NMES pulse width 380uS, Intensity 44, Rate: 50Hz , waveform: asymmetrical 8 minutes total time. 15 sec on/ 30 sec off against with pad placed along peroneal musculature. Active Eversion  performed throughout entirety of motion during activation of electrical signal.    Therapeutic Exercise: Sit to stand without use of UE - 2 x 25 Manual resistance against - inversion/eversion on L LE 2 x 10 Tandem ambulation -- 72ft x 4; patient demonstrates difficulty with placing foot in front of the other on the (more of the R vs L) Heel walking - 27ft x 4        PT Education - 12/20/16 0814    Education provided Yes   Education Details Educated on [employer] throughout session   Person(s) Educated Patient   Methods Explanation;Demonstration   Comprehension  Verbalized understanding;Returned demonstration             PT Long Term Goals - 12/18/16 1648      PT LONG TERM GOAL #1   Title Pt will be independent with HEP in order to improve strength and balance in order to decrease fall risk and improve function at home and work.   Baseline 10/30/16: Mod-min A for exericise progression/ performance 12/18/16: Requires minA for technique with exercise   Time 6   Period Weeks   Status On-going     PT LONG TERM GOAL #2   Title Pt will improve BERG by at least 3 points in  order to demonstrate clinically significant improvement in balance.   Baseline 08/07/16: 49/56 10/02/16: 53/56   Time 8   Period Weeks   Status Achieved     PT LONG TERM GOAL #3   Title Pt will decrease TUG to below 14 seconds in order to demonstrate decreased fall risk    Baseline 08/07/16: 21.9 seconds; 10/02/16: 12.3    Time 8   Period Weeks   Status Achieved     PT LONG TERM GOAL #4   Title Pt will decrease 5TSTS by at least 3 seconds in order to demonstrate clinically significant improvement in LE strength   Baseline 08/07/16: 18.5 seconds; 10/02/16: 14.3   Time 8   Period Weeks   Status Achieved     PT LONG TERM GOAL #5   Title Pt will increase 10MWT by at least 0.13 m/s in order to demonstrate clinically significant improvement in community ambulation   Baseline 08/07/16: 0.53 m/s 10/02/16: .97 m/s   Time 8   Period Weeks   Status Achieved     Additional Long Term Goals   Additional Long Term Goals Yes     PT LONG TERM GOAL #6   Title Pt will be able to swing a golf club safely in order to complete 9 holes of golf   Baseline 10/30/16: Unable to swing golf club safely 12/19/15: unable to swing golf club   Time 6   Period Weeks   Status On-going     PT LONG TERM GOAL #7   Title Pt will improve 6MWT by at least 164ft  to demonstrate improved aerobic capacity/endurance   Baseline 582 ft 10/02/16: 1111ft   Time 7   Period Weeks   Status Achieved     PT LONG TERM GOAL #8   Title Patient will improve DGI by 4 points to demonstrate improvement in dynamic gait/balance and decrease fall risk   Baseline 10/02/16: 19/24; 10/30/16: 21/24 12/18/16: 23/24   Time 6   Period Weeks   Status Achieved     PT LONG TERM GOAL  #9   TITLE Patient will demonstrate ability to perform single leg stance for >10 sec bilaterally to better be able to climb ladders   Baseline L LE SLS: 3 sec   Time 8   Period Weeks   Status New     PT LONG TERM GOAL  #10   TITLE Patient will be able to tandem  walk 59ft without loss of balance to better perform dynamic balance activities to perform occupational duties.   Baseline Requires stepping responses to maintain balance with tandem walking    Time 8   Period Weeks   Status New               Plan - 12/20/16 0819    Clinical Impression Statement Focsued on  improving peroneal activation of the L LE to improve ability to balance on the affected LE. Patient demonstrates decreased balance requiring use of UE to maintaini balance. Patient will benefit from further skilled therapy focused on improving balance and ankle muscular activation to decrease fall risk.    Rehab Potential Good   Clinical Impairments Affecting Rehab Potential Positive: motivation, prior functional level; Negative: no active dorsiflexion, increased LUE tone   PT Frequency 2x / week   PT Duration 6 weeks   PT Treatment/Interventions ADLs/Self Care Home Management;Biofeedback;Canalith Repostioning;Cryotherapy;Electrical Stimulation;Iontophoresis 4mg /ml Dexamethasone;Aquatic Therapy;Moist Heat;Traction;Ultrasound;Gait training;Stair training;Functional mobility training;Therapeutic activities;DME Instruction;Therapeutic exercise;Balance training;Neuromuscular re-education;Cognitive remediation;Patient/family education;Orthotic Fit/Training;Manual techniques;Passive range of motion;Energy conservation;Taping;Vestibular   PT Next Visit Plan [place]; DGI, LEFS, NME for L anterior tibialis, Walk-aide, progress balance, strengthening, and HEP; Consider sidelying hip abduction exercises   PT Home Exercise Plan Sit to stand, seated tband resisted clams, seated adduction squeeze   Consulted and Agree with Plan of Care Patient      Patient will benefit from skilled therapeutic intervention in order to improve the following deficits and impairments:  Abnormal gait, Decreased balance, Decreased coordination, Decreased mobility, Decreased range of motion, Decreased strength, Difficulty  walking, Impaired tone, Impaired UE functional use  Visit Diagnosis: Muscle weakness (generalized)  Other lack of coordination  Unsteadiness on feet     Problem List Patient Active Problem List   Diagnosis Date Noted  . 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 Neurocognitive disorder   . Left foot drop   . Benign essential HTN   . Hemiparesis affecting nondominant side as late effect of cerebrovascular accident (CVA) ([place]) 07/18/2016  . Right basal ganglia embolic stroke ([place]) [id number]  . CVA (cerebral infarction) 07/15/2016  . TIA (transient ischemic attack) 07/14/2016  . HTN, goal below 140/80 07/14/2016  . Left facial numbness 07/14/2016  . Weakness of left side of body 07/14/2016    Blythe Stanford, PT DPT 12/20/2016, 8:46 AM  [place] MAIN [facility] SERVICES [address] [place] [address] [place], [place], [address] Phone: [phone]   Fax:  [phone]  Name: Jason Oconnor MRN: [id number] Date of Birth: 19-Oct-1956

## 2016-12-23 ENCOUNTER — Encounter: Payer: Self-pay | Admitting: Psychology

## 2016-12-25 ENCOUNTER — Ambulatory Visit: Payer: 59

## 2016-12-25 ENCOUNTER — Encounter: Payer: Self-pay | Admitting: Occupational Therapy

## 2016-12-25 ENCOUNTER — Ambulatory Visit: Payer: 59 | Admitting: Occupational Therapy

## 2016-12-25 DIAGNOSIS — R2681 Unsteadiness on feet: Secondary | ICD-10-CM

## 2016-12-25 DIAGNOSIS — M6281 Muscle weakness (generalized): Secondary | ICD-10-CM

## 2016-12-25 DIAGNOSIS — R278 Other lack of coordination: Secondary | ICD-10-CM

## 2016-12-25 NOTE — Therapy (Signed)
[place] MAIN [facility] SERVICES [address] [place], [place], [address] Phone: [phone]   Fax:  [phone]  Physical Therapy Treatment  Patient Details  Name: Jason Oconnor MRN: [id number] Date of Birth: 1956-09-05 Referring Provider: Dr. Naaman Plummer  Encounter Date: 12/25/2016      PT End of Session - 12/25/16 0852    Visit Number 35   Number of Visits 53   Date for PT Re-Evaluation 02/12/17   Authorization Type no g codes   PT Start Time 0800   PT Stop Time 0845   PT Time Calculation (min) 45 min   Equipment Utilized During Treatment Gait belt   Activity Tolerance Patient tolerated treatment well   Behavior During Therapy [facility] for tasks assessed/performed      Past Medical History:  Diagnosis Date  . Bone marrow disease   . Hypertension     History reviewed. No pertinent surgical history.  There were no vitals filed for this visit.      Subjective Assessment - 12/25/16 0844    Subjective Patient reports he continues to have spasms in his LEs at night.    Patient is accompained by: --  wife   Pertinent History Patient is a 61 year old right-handed male with history of hypertension. Per chart review patient is married. 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 Presented to [facility] 07/14/2016 with left-sided weakness and facial droop. Blood pressure elevated 170/106. Head CT scan negative. 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 CTA of the head showed no emergent large vessel occlusion or severe stenosis. Carotid Dopplers with no ICA stenosis. Patient did not receive TPA. Echocardiogram with ejection fraction of 60%. Normal systolic function. Placed on aspirin and Plavix for CVA prophylaxis. Subcutaneous Lovenox for DVT prophylaxis.  Patient transferred  to CIR on 07/16/2016. Pt discharged form CIR on 08/03/16. Pt reports that he made progress at CIR. No reported problems since returning home. Currently using a wide base quad cane in RUE and L AFO. Pt reports that he has made the last progress with respect to his LUE strength.    Patient Stated Goals "Get back as close as I can to normal." Pt would to be able to play golf. Pt would like to return to some kind of work.    Currently in Pain? No/denies      TREATMENT:  Modalities  NMES pulse width 380uS, Intensity 44, Rate: 50Hz , waveform: asymmetrical 8 minutes total time. 15 sec on/ 30 sec off against with pad placed along peroneal musculature. Active Eversion performed throughout entirety of motion during activation of electrical signal.    Therapeutic Exercise: Manual resistance against - inversion/eversion on L LE 2 x 12 Tandem ambulation -- 52ft x 2; patient demonstrates difficulty with placing foot in front of the other on the (more of the R vs L) Single leg stance on airex - 2 x 30sec; single leg stance on ground - 2 x 30sec; on bosu in single leg stance - 2 x 30sec Single leg stance dead lifts throughout decreased AROM -- 4 x 4 from heightened surface. Sit to stand without use of UE - 2 x 15      PT Education - 12/25/16 0851    Education provided Yes   Education Details Education on muscle activation for balance   Person(s) Educated Patient  Methods Explanation;Demonstration   Comprehension Verbalized understanding;Returned demonstration             PT Long Term Goals - 12/18/16 1648      PT LONG TERM GOAL #1   Title Pt will be independent with HEP in order to improve strength and balance in order to decrease fall risk and improve function at home and work.   Baseline 10/30/16: Mod-min A for exericise progression/ performance 12/18/16: Requires minA for technique with exercise   Time 6   Period Weeks   Status On-going     PT LONG TERM GOAL #2   Title Pt will improve BERG  by at least 3 points in order to demonstrate clinically significant improvement in balance.   Baseline 08/07/16: 49/56 10/02/16: 53/56   Time 8   Period Weeks   Status Achieved     PT LONG TERM GOAL #3   Title Pt will decrease TUG to below 14 seconds in order to demonstrate decreased fall risk    Baseline 08/07/16: 21.9 seconds; 10/02/16: 12.3    Time 8   Period Weeks   Status Achieved     PT LONG TERM GOAL #4   Title Pt will decrease 5TSTS by at least 3 seconds in order to demonstrate clinically significant improvement in LE strength   Baseline 08/07/16: 18.5 seconds; 10/02/16: 14.3   Time 8   Period Weeks   Status Achieved     PT LONG TERM GOAL #5   Title Pt will increase 10MWT by at least 0.13 m/s in order to demonstrate clinically significant improvement in community ambulation   Baseline 08/07/16: 0.53 m/s 10/02/16: .97 m/s   Time 8   Period Weeks   Status Achieved     Additional Long Term Goals   Additional Long Term Goals Yes     PT LONG TERM GOAL #6   Title Pt will be able to swing a golf club safely in order to complete 9 holes of golf   Baseline 10/30/16: Unable to swing golf club safely 12/19/15: unable to swing golf club   Time 6   Period Weeks   Status On-going     PT LONG TERM GOAL #7   Title Pt will improve 6MWT by at least 171ft  to demonstrate improved aerobic capacity/endurance   Baseline 582 ft 10/02/16: 1153ft   Time 7   Period Weeks   Status Achieved     PT LONG TERM GOAL #8   Title Patient will improve DGI by 4 points to demonstrate improvement in dynamic gait/balance and decrease fall risk   Baseline 10/02/16: 19/24; 10/30/16: 21/24 12/18/16: 23/24   Time 6   Period Weeks   Status Achieved     PT LONG TERM GOAL  #9   TITLE Patient will demonstrate ability to perform single leg stance for >10 sec bilaterally to better be able to climb ladders   Baseline L LE SLS: 3 sec   Time 8   Period Weeks   Status New     PT LONG TERM GOAL  #10   TITLE Patient  will be able to tandem walk 84ft without loss of balance to better perform dynamic balance activities to perform occupational duties.   Baseline Requires stepping responses to maintain balance with tandem walking    Time 8   Period Weeks   Status New               Plan - 12/25/16 [id number]  Clinical Impression Statement Focused on improving peroneal activation to improve balance and patient demonstrates decreased use of UE to perform balance activities. Patient continues to demonstrate decrease peroneal activation but has improved from 1/5MMT to 2/5 MMT for ankle eversion indicating functional carryover. Patient will benefit from further skilled therapy focused on improving balance to return to prior level of function.    Rehab Potential Good   Clinical Impairments Affecting Rehab Potential Positive: motivation, prior functional level; Negative: no active dorsiflexion, increased LUE tone   PT Frequency 2x / week   PT Duration 6 weeks   PT Treatment/Interventions ADLs/Self Care Home Management;Biofeedback;Canalith Repostioning;Cryotherapy;Electrical Stimulation;Iontophoresis 4mg /ml Dexamethasone;Aquatic Therapy;Moist Heat;Traction;Ultrasound;Gait training;Stair training;Functional mobility training;Therapeutic activities;DME Instruction;Therapeutic exercise;Balance training;Neuromuscular re-education;Cognitive remediation;Patient/family education;Orthotic Fit/Training;Manual techniques;Passive range of motion;Energy conservation;Taping;Vestibular   PT Next Visit Plan Clamshell; DGI, LEFS, NME for L anterior tibialis, Walk-aide, progress balance, strengthening, and HEP; Consider sidelying hip abduction exercises   PT Home Exercise Plan Sit to stand, seated tband resisted clams, seated adduction squeeze   Consulted and Agree with Plan of Care Patient      Patient will benefit from skilled therapeutic intervention in order to improve the following deficits and impairments:  Abnormal gait,  Decreased balance, Decreased coordination, Decreased mobility, Decreased range of motion, Decreased strength, Difficulty walking, Impaired tone, Impaired UE functional use  Visit Diagnosis: Muscle weakness (generalized)  Other lack of coordination  Unsteadiness on feet     Problem List Patient Active Problem List   Diagnosis Date Noted  . 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 Neurocognitive disorder   . Left foot drop   . Benign essential HTN   . Hemiparesis affecting nondominant side as late effect of cerebrovascular accident (CVA) ([place]) 07/18/2016  . Right basal ganglia embolic stroke ([place]) [id number]  . CVA (cerebral infarction) 07/15/2016  . TIA (transient ischemic attack) 07/14/2016  . HTN, goal below 140/80 07/14/2016  . Left facial numbness 07/14/2016  . Weakness of left side of body 07/14/2016    Blythe Stanford, PT DPT 12/25/2016, 9:14 AM  [place] MAIN [facility] SERVICES [address] [place], [place], [address] Phone: [phone]   Fax:  [phone]  Name: Jason Oconnor MRN: [id number] Date of Birth: March 11, 1956

## 2016-12-25 NOTE — Therapy (Signed)
[place] MAIN [facility] SERVICES [address] [place], [place], [address] Phone: [phone]   Fax:  [phone]  Occupational Therapy Treatment  Patient Details  Name: Jason Oconnor MRN: [id number] Date of Birth: [birth year] No Data Recorded  Encounter Date: 12/25/2016      OT End of Session - 12/25/16 2342    Visit Number 36   Number of Visits 6   Date for OT Re-Evaluation 01/24/17   Authorization Type 7 of 20 visits for 2018 calendar year   OT Start Time 0845   OT Stop Time 0930   OT Time Calculation (min) 45 min   Activity Tolerance Patient tolerated treatment well   Behavior During Therapy [facility] for tasks assessed/performed      Past Medical History:  Diagnosis Date  . Bone marrow disease   . Hypertension     History reviewed. No pertinent surgical history.  There were no vitals filed for this visit.      Subjective Assessment - 12/25/16 0848    Subjective  Patient reports he is going to help his son today with getting a new toolbox for work.     Pertinent History Patient reports on July 13, 2016 he was helping a friend doing home repairs and felt tired, went home and started to notice problems moving around.  The next morning he feel trying to put his slippers on, wife transported him to ER and [facility] for 3 days and then went to [facility] for REhab and was discharged this past Saturday to home.     Patient Stated Goals "I want to get back to close as normal as possible, play golf again, work in the garage."     Currently in Pain? Yes   Pain Score 3    Pain Location Shoulder   Pain Orientation Left   Pain Descriptors / Indicators Aching;Stabbing   Pain Type Acute pain   Pain Frequency Intermittent                      OT Treatments/Exercises (OP) - 12/25/16 2346      Fine Motor Coordination   Other Fine Motor Exercises Patient seen this date for manipulation of checkers for picking up stacking up to 20 and 25  at a time with precise movements to knock tower over.  He was instructed on use of checkers at home for picking up, moving towards palm and back and placing onto elevated surfaces and how to grade task.      Neurological Re-education Exercises   Other Exercises 1 Patient seen for 3# dowel exercises in sitting for shoulder flexion, ABD, ADD, chest press, forwards/backwards circles for 15 reps for 1 set.  Grip strength L hand for 25 reps with 17# then 10 reps with 23# with cues for keeping wrist in alignment and straight to pick up object with hand gripper.                 OT Education - 12/25/16 2341    Education provided Yes   Education Details HEP   Person(s) Educated Patient   Methods Explanation;Demonstration;Verbal cues   Comprehension Verbal cues required;Returned demonstration;Verbalized understanding             OT Long Term Goals - 12/06/16 0921      OT LONG TERM GOAL #1   Title Patient to improve left UE coordination sufficient to tie shoes with modified independence.    Baseline unable  Time 12   Period Weeks   Status On-going     OT LONG TERM GOAL #2   Title Patient to improve LUE coordination to complete buttons, snaps and zippers on clothing with modified independence.   Time 12   Period Weeks   Status On-going     OT LONG TERM GOAL #3   Title Patient to complete bathing with modified independence including using left arm to wash right side of body.    Time 12   Period Weeks   Status Achieved     OT LONG TERM GOAL #4   Title Patient to improve left hand use to type on computer using both hands on home row keys, 2 lines with no errors in 5 minutes.    Baseline unable   Time 12   Period Weeks   Status On-going     OT LONG TERM GOAL #5   Title Patient will improve grip on left hand by 10# to stabilize jars and containers for opening.    Baseline unable   Time 12   Period Weeks   Status On-going     OT LONG TERM GOAL #6   Title Patient will  be able to demonstrate sweeping with use of bilateral UEs with modified independence.    Baseline unable   Time 12   Period Weeks   Status Partially Met     OT LONG TERM GOAL #7   Title Patient will improve grip strength in left hand to be able to stabilize utensils to cut meat with modified independence.    Baseline unable    Time 12   Period Weeks   Status Partially Met     OT LONG TERM GOAL #8   Title Patient will be able to demonstrate increased strength in LUE to reach into microwave and retrieve plate of food with modified independence.   Baseline unable.    Time 12   Period Weeks   Status On-going     OT LONG TERM GOAL  #9   Baseline Patient will improve left UE control and reach to operate left turning signal in the car with modified independence.    Time 12   Period Weeks   Status Partially Met     OT LONG TERM GOAL  #10   TITLE Patient will be able to hold and manage large brush to wash his truck with modified independence.    Baseline unable   Time 12   Period Weeks   Status On-going               Plan - 12/25/16 2342    Clinical Impression Statement Patient requires decreased cues with UE exercises for compensation at the left shoulder, he is able to detect most times when he is substiting the movements and will self correct.  He also responds to mirroring cues to correct when he is using right hand more than he should and redirect focus on left UE. Patient has continued to progress in all areas and is very consistent with performing exercises at home on a daily basis and staying as busy as possible.  Continue to work towards goasl to increase LUE use for ADL and IADl tasks.    Rehab Potential Good   OT Frequency 2x / week   OT Duration 12 weeks   OT Treatment/Interventions Self-care/ADL training;Therapeutic exercise;Neuromuscular education;Visual/perceptual remediation/compensation;Therapist, nutritional;Therapeutic exercises;Patient/family  education;DME and/or AE instruction;Manual Therapy;Therapeutic activities;Balance training   Consulted and Agree with Plan of  Care Patient      Patient will benefit from skilled therapeutic intervention in order to improve the following deficits and impairments:  Decreased knowledge of use of DME, Decreased coordination, Impaired sensation, Decreased activity tolerance, Decreased range of motion, Decreased strength, Decreased balance, Difficulty walking, Impaired UE functional use  Visit Diagnosis: Other lack of coordination  Muscle weakness (generalized)    Problem List Patient Active Problem List   Diagnosis Date Noted  . 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 Neurocognitive disorder   . Left foot drop   . Benign essential HTN   . Hemiparesis affecting nondominant side as late effect of cerebrovascular accident (CVA) ([place]) 07/18/2016  . Right basal ganglia embolic stroke ([place]) 12/05/347  . CVA (cerebral infarction) 07/15/2016  . TIA (transient ischemic attack) 07/14/2016  . HTN, goal below 140/80 07/14/2016  . Left facial numbness 07/14/2016  . Weakness of left side of body 07/14/2016   Aunna Snooks Oneita Jolly, OTR/L, CLT  Marquay Kruse 12/25/2016, 11:50 PM  [place] MAIN [facility] SERVICES [address] [place], [place], [address] Phone: [phone]   Fax:  [phone]  Name: Jason Oconnor MRN: [id number] Date of Birth: 09/06/1956

## 2016-12-27 ENCOUNTER — Ambulatory Visit: Payer: 59 | Attending: Physical Medicine & Rehabilitation

## 2016-12-27 ENCOUNTER — Ambulatory Visit: Payer: 59 | Admitting: Occupational Therapy

## 2016-12-27 DIAGNOSIS — R278 Other lack of coordination: Secondary | ICD-10-CM

## 2016-12-27 DIAGNOSIS — M6281 Muscle weakness (generalized): Secondary | ICD-10-CM | POA: Diagnosis present

## 2016-12-27 DIAGNOSIS — R2681 Unsteadiness on feet: Secondary | ICD-10-CM | POA: Diagnosis present

## 2016-12-27 NOTE — Therapy (Signed)
[place] MAIN [facility] SERVICES [address] [place], [place], [address] Phone: [phone]   Fax:  [phone]  Occupational Therapy Treatment  Patient Details  Name: Jason Oconnor MRN: [id number] Date of Birth: 1956-01-29 No Data Recorded  Encounter Date: 12/27/2016      OT End of Session - 12/27/16 0913    Visit Number 37   Number of Visits 48   Authorization Type 8 of 20 visits for 2018 calendar year   OT Start Time 0845   OT Stop Time 0930   OT Time Calculation (min) 45 min   Activity Tolerance Patient tolerated treatment well   Behavior During Therapy [facility] for tasks assessed/performed      Past Medical History:  Diagnosis Date  . Bone marrow disease   . Hypertension     No past surgical history on file.  There were no vitals filed for this visit.      Subjective Assessment - 12/27/16 0903    Subjective  Pt. reports that his long term disability was approved.   Pertinent History Patient reports on July 13, 2016 he was helping a friend doing home repairs and felt tired, went home and started to notice problems moving around.  The next morning he feel trying to put his slippers on, wife transported him to ER and [facility] for 3 days and then went to [facility] for REhab and was discharged this past Saturday to home.     Patient Stated Goals "I want to get back to close as normal as possible, play golf again, work in the garage."     Currently in Pain? No/denies                      OT Treatments/Exercises (OP) - 12/27/16 7416      Fine Motor Coordination   Other Fine Motor Exercises Pt. performed [facility] skills training to improve speed and dexterity needed for ADL tasks and writing. Pt. demonstrated grasping 1 inch sticks,  inch cylindrical collars, and  inch flat washers on the Purdue pegboard. Pt. performed grasping each item with her 2nd digit and thumb. Pt. presented with difficulty storing  inch objects at a time in  the palmar aspect of the hand. Pt. Worked on bilateral hand coordination tasks, and bimanual dexterity skills. Pt. Worked on grasping 1" washers, and placing them on the stick tower.      Neurological Re-education Exercises   Other Exercises 1 Pt. worked on the [place] for 8 min. With constant monitoring of the BUEs. Pt. Worked on changing, and alternating forward reverse position every 2 min. Rest breaks were required. Cues were provided for hand placement, and position. Pt. performed resistive EZ Board exercises for forearm supination/pronation, wrist flexion/extension using gross grasp, and lateral pinch (key) grasp.                 OT Education - 12/27/16 0912    Education provided Yes   Education Details LUE strength, [place] skills   Person(s) Educated Patient   Methods Explanation;Demonstration;Verbal cues   Comprehension Verbalized understanding;Returned demonstration;Verbal cues required             OT Long Term Goals - 12/06/16 0921      OT LONG TERM GOAL #1   Title Patient to improve left UE coordination sufficient to tie shoes with modified independence.    Baseline unable   Time 12   Period Weeks   Status On-going  OT LONG TERM GOAL #2   Title Patient to improve LUE coordination to complete buttons, snaps and zippers on clothing with modified independence.   Time 12   Period Weeks   Status On-going     OT LONG TERM GOAL #3   Title Patient to complete bathing with modified independence including using left arm to wash right side of body.    Time 12   Period Weeks   Status Achieved     OT LONG TERM GOAL #4   Title Patient to improve left hand use to type on computer using both hands on home row keys, 2 lines with no errors in 5 minutes.    Baseline unable   Time 12   Period Weeks   Status On-going     OT LONG TERM GOAL #5   Title Patient will improve grip on left hand by 10# to stabilize jars and containers for opening.    Baseline unable   Time 12    Period Weeks   Status On-going     OT LONG TERM GOAL #6   Title Patient will be able to demonstrate sweeping with use of bilateral UEs with modified independence.    Baseline unable   Time 12   Period Weeks   Status Partially Met     OT LONG TERM GOAL #7   Title Patient will improve grip strength in left hand to be able to stabilize utensils to cut meat with modified independence.    Baseline unable    Time 12   Period Weeks   Status Partially Met     OT LONG TERM GOAL #8   Title Patient will be able to demonstrate increased strength in LUE to reach into microwave and retrieve plate of food with modified independence.   Baseline unable.    Time 12   Period Weeks   Status On-going     OT LONG TERM GOAL  #9   Baseline Patient will improve left UE control and reach to operate left turning signal in the car with modified independence.    Time 12   Period Weeks   Status Partially Met     OT LONG TERM GOAL  #10   TITLE Patient will be able to hold and manage large brush to wash his truck with modified independence.    Baseline unable   Time 12   Period Weeks   Status On-going               Plan - 12/27/16 0914    Clinical Impression Statement Pt. reports he  is starting the process for appying for social security. Pt. is tolerating LUE strengthening exercises with rest breaks. Pt. continues to work on improving the development of [facility] [place] skills, grasping, manipulating objects, and translatory movements for use during ADL, and IADL tasks.      Rehab Potential Good   OT Frequency 2x / week   OT Duration 12 weeks   OT Treatment/Interventions Self-care/ADL training;Therapeutic exercise;Neuromuscular education;Visual/perceptual remediation/compensation;Therapist, nutritional;Therapeutic exercises;Patient/family education;DME and/or AE instruction;Manual Therapy;Therapeutic activities;Balance training   Consulted and Agree with Plan of Care Patient      Patient will  benefit from skilled therapeutic intervention in order to improve the following deficits and impairments:  Decreased knowledge of use of DME, Decreased coordination, Impaired sensation, Decreased activity tolerance, Decreased range of motion, Decreased strength, Decreased balance, Difficulty walking, Impaired UE functional use  Visit Diagnosis: Muscle weakness (generalized)  Other lack of coordination  Problem List Patient Active Problem List   Diagnosis Date Noted  . 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 Neurocognitive disorder   . Left foot drop   . Benign essential HTN   . Hemiparesis affecting nondominant side as late effect of cerebrovascular accident (CVA) ([place]) 07/18/2016  . Right basal ganglia embolic stroke ([place]) 17/00/1749  . CVA (cerebral infarction) 07/15/2016  . TIA (transient ischemic attack) 07/14/2016  . HTN, goal below 140/80 07/14/2016  . Left facial numbness 07/14/2016  . Weakness of left side of body 07/14/2016    Harrel Carina, MS, OTR/L 12/27/2016, 9:40 AM  [place] MAIN [facility] SERVICES [address] [place], [place], [address] Phone: [phone]   Fax:  [phone]  Name: Maurice Fotheringham MRN: [id number] Date of Birth: 1956-05-17

## 2016-12-27 NOTE — Patient Instructions (Signed)
OT TREATMENT    Neuro muscular re-education:  Pt. performed [facility] skills training to improve speed and dexterity needed for ADL tasks and writing. Pt. demonstrated grasping 1 inch sticks,  inch cylindrical collars, and  inch flat washers on the [employer] pegboard. Pt. performed grasping each item with her 2nd digit and thumb. Pt. presented with difficulty storing  inch objects at a time in the palmar aspect of the hand. Pt. Worked on bilateral hand coordination tasks, and bimanual dexterity skills. Pt. Worked on grasping 1" washers, and placing them on the stick tower.   Therapeutic Exercise:  Pt. worked on the [employer] for 8 min. With constant monitoring of the BUEs. Pt. Worked on changing, and alternating forward reverse position every 2 min. Rest breaks were required. Cues were provided for hand placement, and position. Pt. performed resistive EZ Board exercises for forearm supination/pronation, wrist flexion/extension using gross grasp, and lateral pinch (key) grasp.   Selfcare:  Manual Therapy:

## 2016-12-27 NOTE — Therapy (Signed)
[place] MAIN [facility] SERVICES [address] [place], [place], [address] Phone: [phone]   Fax:  [phone]  Physical Therapy Treatment  Patient Details  Name: Jason Oconnor MRN: [id number] Date of Birth: September 29, 1956 Referring Provider: Dr. Naaman Plummer  Encounter Date: 12/27/2016      PT End of Session - 12/27/16 0927    Visit Number 36   Number of Visits 53   Date for PT Re-Evaluation 02/12/17   Authorization Type no g codes   PT Start Time 0800   PT Stop Time 0845   PT Time Calculation (min) 45 min   Equipment Utilized During Treatment Gait belt   Activity Tolerance Patient tolerated treatment well   Behavior During Therapy [facility] for tasks assessed/performed      Past Medical History:  Diagnosis Date  . Bone marrow disease   . Hypertension     No past surgical history on file.  There were no vitals filed for this visit.      Subjective Assessment - 12/27/16 0851    Subjective Patient reports he's worried about his budget having to go on long term disability. States he continues to have difficulties walking.    Patient is accompained by: --  wife   Pertinent History Patient is a 61 year old right-handed male with history of hypertension. Per chart review patient is married. 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 Presented to [facility] 07/14/2016 with left-sided weakness and facial droop. Blood pressure elevated 170/106. Head CT scan negative. 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 CTA of the head showed no emergent large vessel occlusion or severe stenosis. Carotid Dopplers with no ICA stenosis. Patient did not receive TPA. Echocardiogram with ejection fraction of 60%. Normal systolic function. Placed on aspirin and Plavix for CVA prophylaxis.  Subcutaneous Lovenox for DVT prophylaxis.  Patient transferred to [facility] on 07/16/2016. Pt discharged form [facility] on 08/03/16. Pt reports that he made progress at CIR. No reported problems since returning home. Currently using a wide base quad cane in RUE and L AFO. Pt reports that he has made the last progress with respect to his LUE strength.    Patient Stated Goals "Get back as close as I can to normal." Pt would to be able to play golf. Pt would like to return to some kind of work.    Currently in Pain? No/denies      TREATMENT:  Modalities  NMES pulse width 380uS, Intensity 44, Rate: 50Hz , waveform: asymmetrical 8 minutes total time. 15 sec on/ 30 sec off against with pad placed along peroneal musculature. Active Eversion performed throughout entirety of motion during activation of electrical signal.    Therapeutic Exercise: Manual resistance against - inversion/eversion on L LE 2 x 12 Inversion/eversion with mirror - 3 x 10 (Improved muscular activation and AROM after mirror placed in between legs) Tandem stance on half foam roller - 2 x 15min Stepping lunges from airex pad over 2 half foam rollers - x15 Left foot BAPS board lvl 1-forward/backward 2 min; left/right 2 min Tandem ambulation -- 52ft x 2; patient demonstrates difficulty with placing foot in front of the other on the (more of the R vs L) Single leg stance on airex - 2 x 30sec; single leg stance on ground - 2 x 30sec;        PT  Education - 12/27/16 [phone]    Education provided Yes   Education Details POC, mirror therapy edcuation   Person(s) Educated Patient   Methods Explanation;Demonstration   Comprehension Verbalized understanding;Returned demonstration             PT Long Term Goals - 12/18/16 1648      PT LONG TERM GOAL #1   Title Pt will be independent with HEP in order to improve strength and balance in order to decrease fall risk and improve function at home and work.   Baseline 10/30/16: Mod-min A for exericise  progression/ performance 12/18/16: Requires minA for technique with exercise   Time 6   Period Weeks   Status On-going     PT LONG TERM GOAL #2   Title Pt will improve BERG by at least 3 points in order to demonstrate clinically significant improvement in balance.   Baseline 08/07/16: 49/56 10/02/16: 53/56   Time 8   Period Weeks   Status Achieved     PT LONG TERM GOAL #3   Title Pt will decrease TUG to below 14 seconds in order to demonstrate decreased fall risk    Baseline 08/07/16: 21.9 seconds; 10/02/16: 12.3    Time 8   Period Weeks   Status Achieved     PT LONG TERM GOAL #4   Title Pt will decrease 5TSTS by at least 3 seconds in order to demonstrate clinically significant improvement in LE strength   Baseline 08/07/16: 18.5 seconds; 10/02/16: 14.3   Time 8   Period Weeks   Status Achieved     PT LONG TERM GOAL #5   Title Pt will increase 10MWT by at least 0.13 m/s in order to demonstrate clinically significant improvement in community ambulation   Baseline 08/07/16: 0.53 m/s 10/02/16: .97 m/s   Time 8   Period Weeks   Status Achieved     Additional Long Term Goals   Additional Long Term Goals Yes     PT LONG TERM GOAL #6   Title Pt will be able to swing a golf club safely in order to complete 9 holes of golf   Baseline 10/30/16: Unable to swing golf club safely 12/19/15: unable to swing golf club   Time 6   Period Weeks   Status On-going     PT LONG TERM GOAL #7   Title Pt will improve 6MWT by at least 117ft  to demonstrate improved aerobic capacity/endurance   Baseline 582 ft 10/02/16: 114ft   Time 7   Period Weeks   Status Achieved     PT LONG TERM GOAL #8   Title Patient will improve DGI by 4 points to demonstrate improvement in dynamic gait/balance and decrease fall risk   Baseline 10/02/16: 19/24; 10/30/16: 21/24 12/18/16: 23/24   Time 6   Period Weeks   Status Achieved     PT LONG TERM GOAL  #9   TITLE Patient will demonstrate ability to perform single leg  stance for >10 sec bilaterally to better be able to climb ladders   Baseline L LE SLS: 3 sec   Time 8   Period Weeks   Status New     PT LONG TERM GOAL  #10   TITLE Patient will be able to tandem walk 11ft without loss of balance to better perform dynamic balance activities to perform occupational duties.   Baseline Requires stepping responses to maintain balance with tandem walking    Time 8   Period Weeks  Status New               Plan - 12/27/16 [id number]    Clinical Impression Statement Performed mirror therapy to improve peroneal activation with exercises and patient demonstrates significant improvement in AROM and strength after use of mirror indicating improved motor control. Patient continues to demonstrate significant weakness and decreased endurance; patient will benefit from further skilled therapy to return to prior level of function.    Rehab Potential Good   Clinical Impairments Affecting Rehab Potential Positive: motivation, prior functional level; Negative: no active dorsiflexion, increased LUE tone   PT Frequency 2x / week   PT Duration 6 weeks   PT Treatment/Interventions ADLs/Self Care Home Management;Biofeedback;Canalith Repostioning;Cryotherapy;Electrical Stimulation;Iontophoresis 4mg /ml Dexamethasone;Aquatic Therapy;Moist Heat;Traction;Ultrasound;Gait training;Stair training;Functional mobility training;Therapeutic activities;DME Instruction;Therapeutic exercise;Balance training;Neuromuscular re-education;Cognitive remediation;Patient/family education;Orthotic Fit/Training;Manual techniques;Passive range of motion;Energy conservation;Taping;Vestibular   PT Next Visit Plan Clamshell; DGI, LEFS, NME for L anterior tibialis, Walk-aide, progress balance, strengthening, and HEP; Consider sidelying hip abduction exercises   PT Home Exercise Plan Sit to stand, seated tband resisted clams, seated adduction squeeze   Consulted and Agree with Plan of Care Patient       Patient will benefit from skilled therapeutic intervention in order to improve the following deficits and impairments:  Abnormal gait, Decreased balance, Decreased coordination, Decreased mobility, Decreased range of motion, Decreased strength, Difficulty walking, Impaired tone, Impaired UE functional use  Visit Diagnosis: Other lack of coordination  Muscle weakness (generalized)  Unsteadiness on feet     Problem List Patient Active Problem List   Diagnosis Date Noted  . 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 Neurocognitive disorder   . Left foot drop   . Benign essential HTN   . Hemiparesis affecting nondominant side as late effect of cerebrovascular accident (CVA) ([place]) 07/18/2016  . Right basal ganglia embolic stroke ([place]) [id number]  . CVA (cerebral infarction) 07/15/2016  . TIA (transient ischemic attack) 07/14/2016  . HTN, goal below 140/80 07/14/2016  . Left facial numbness 07/14/2016  . Weakness of left side of body 07/14/2016    Blythe Stanford, PT DPT 12/27/2016, 9:51 AM  [place] MAIN [facility] SERVICES [address] [place], [place], [address] Phone: [phone]   Fax:  [phone]  Name: Jason Oconnor MRN: [id number] Date of Birth: 1956/04/08

## 2017-01-01 ENCOUNTER — Encounter: Payer: 59 | Admitting: Occupational Therapy

## 2017-01-03 ENCOUNTER — Ambulatory Visit: Payer: 59 | Admitting: Occupational Therapy

## 2017-01-03 ENCOUNTER — Ambulatory Visit: Payer: 59

## 2017-01-03 ENCOUNTER — Encounter: Payer: Self-pay | Admitting: Occupational Therapy

## 2017-01-03 ENCOUNTER — Encounter: Payer: 59 | Admitting: Occupational Therapy

## 2017-01-03 DIAGNOSIS — R278 Other lack of coordination: Secondary | ICD-10-CM

## 2017-01-03 DIAGNOSIS — M6281 Muscle weakness (generalized): Secondary | ICD-10-CM

## 2017-01-03 DIAGNOSIS — R2681 Unsteadiness on feet: Secondary | ICD-10-CM

## 2017-01-03 NOTE — Therapy (Signed)
[place] MAIN [facility] SERVICES [address] [place], [place], [address] Phone: [phone]   Fax:  [phone]  Occupational Therapy Treatment  Patient Details  Name: Jason Oconnor MRN: [id number] Date of Birth: 05-17-1956 No Data Recorded  Encounter Date: 01/03/2017      OT End of Session - 01/03/17 1428    Visit Number 38   Number of Visits 76   Date for OT Re-Evaluation 01/24/17   Authorization Type 9 of 20 visits for 2018 calendar year   OT Start Time 0845   OT Stop Time 0930   OT Time Calculation (min) 45 min   Activity Tolerance Patient tolerated treatment well   Behavior During Therapy [facility] for tasks assessed/performed      Past Medical History:  Diagnosis Date  . Bone marrow disease   . Hypertension     History reviewed. No pertinent surgical history.  There were no vitals filed for this visit.      Subjective Assessment - 01/03/17 1422    Subjective  Patient reports his wife's father passed away and she had to go to [place] this weekend, he will be alone and have to do everything for himself.     Pertinent History Patient reports on July 13, 2016 he was helping a friend doing home repairs and felt tired, went home and started to notice problems moving around.  The next morning he feel trying to put his slippers on, wife transported him to ER and [facility] for 3 days and then went to [facility] for REhab and was discharged this past Saturday to home.     Patient Stated Goals "I want to get back to close as normal as possible, play golf again, work in the garage."     Currently in Pain? Yes   Pain Score 4    Pain Location Shoulder   Pain Orientation Left   Pain Descriptors / Indicators Aching   Pain Type Acute pain   Pain Onset More than a month ago   Pain Frequency Intermittent   Aggravating Factors  pain with reaching activities overhead at times   Multiple Pain Sites Yes   Pain Score 4   Pain Location Back   Pain  Orientation Left   Pain Descriptors / Indicators Aching   Pain Type Chronic pain   Pain Onset More than a month ago   Pain Frequency Intermittent                      OT Treatments/Exercises (OP) - 01/03/17 1424      Fine Motor Coordination   Other Fine Motor Exercises Patient seen for manipulation of small washer and dowels in sitting, placing washers onto long dowels in diagonal planes, place and remove with left UE, cues for prehension patterns.       Neurological Re-education Exercises   Other Exercises 1 Patient seen for red theraband exercises for 2 sets of 15 reps for shoulder flexion, extension, diagonal patterns, elbow flexion/ext with cues for form and technique, especially for thumbs up position.  Supination pronation with 1# hammer over tabletop with cues for hand placement, ulnar and radial deivation patterns, wrist flexion/ext (holding hammer sideways) for 2 sets 15 reps each exercise.                  OT Education - 01/03/17 1428    Education provided Yes   Education Details fine motor coordination tasks, theraband exercises.  Person(s) Educated Patient   Methods Explanation;Demonstration;Verbal cues   Comprehension Verbal cues required;Returned demonstration;Verbalized understanding             OT Long Term Goals - 01/03/17 1434      OT LONG TERM GOAL #1   Title Patient to improve left UE coordination sufficient to tie shoes with modified independence.    Baseline unable   Time 12   Period Weeks   Status On-going     OT LONG TERM GOAL #2   Title Patient to improve LUE coordination to complete buttons, snaps and zippers on clothing with modified independence.   Time 12   Period Weeks   Status On-going     OT LONG TERM GOAL #3   Title Patient to complete bathing with modified independence including using left arm to wash right side of body.    Time 12   Period Weeks   Status Achieved     OT LONG TERM GOAL #4   Title Patient  to improve left hand use to type on computer using both hands on home row keys, 2 lines with no errors in 5 minutes.    Baseline unable   Time 12   Period Weeks   Status On-going     OT LONG TERM GOAL #5   Title Patient will improve grip on left hand by 10# to stabilize jars and containers for opening.    Baseline unable   Time 12   Period Weeks   Status On-going     OT LONG TERM GOAL #6   Title Patient will be able to demonstrate sweeping with use of bilateral UEs with modified independence.    Baseline unable   Time 12   Period Weeks   Status Partially Met     OT LONG TERM GOAL #7   Title Patient will improve grip strength in left hand to be able to stabilize utensils to cut meat with modified independence.    Baseline unable    Time 12   Period Weeks   Status Partially Met     OT LONG TERM GOAL #8   Title Patient will be able to demonstrate increased strength in LUE to reach into microwave and retrieve plate of food with modified independence.   Baseline unable.    Time 12   Period Weeks   Status On-going     OT LONG TERM GOAL  #9   Baseline Patient will improve left UE control and reach to operate left turning signal in the car with modified independence.    Time 12   Period Weeks   Status Partially Met     OT LONG TERM GOAL  #10   TITLE Patient will be able to hold and manage large brush to wash his truck with modified independence.    Baseline unable   Time 12   Period Weeks   Status On-going               Plan - 01/03/17 1429    Clinical Impression Statement Patient continues to work towards using his left hand with tasks around the house for light meal prep and housekeeping.  He has been working to fill out his disability papers this week.  Continues to have mild pain in left shoulder at times with reaching tasks, mostly at the left shoulder joint but feels it is overall better than months ago.  Continue to work towards improving ROM, strength and fine  motor coordination of  left UE to assist with the performance of daily tasks at home and in the community.    Rehab Potential Good   OT Frequency 2x / week   OT Duration 12 weeks   OT Treatment/Interventions Self-care/ADL training;Therapeutic exercise;Neuromuscular education;Visual/perceptual remediation/compensation;Therapist, nutritional;Therapeutic exercises;Patient/family education;DME and/or AE instruction;Manual Therapy;Therapeutic activities;Balance training   Consulted and Agree with Plan of Care Patient      Patient will benefit from skilled therapeutic intervention in order to improve the following deficits and impairments:  Decreased knowledge of use of DME, Decreased coordination, Impaired sensation, Decreased activity tolerance, Decreased range of motion, Decreased strength, Decreased balance, Difficulty walking, Impaired UE functional use  Visit Diagnosis: Muscle weakness (generalized)  Other lack of coordination    Problem List Patient Active Problem List   Diagnosis Date Noted  . 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 Neurocognitive disorder   . Left foot drop   . Benign essential HTN   . Hemiparesis affecting nondominant side as late effect of cerebrovascular accident (CVA) ([place]) 07/18/2016  . Right basal ganglia embolic stroke ([place]) 04/59/9774  . CVA (cerebral infarction) 07/15/2016  . TIA (transient ischemic attack) 07/14/2016  . HTN, goal below 140/80 07/14/2016  . Left facial numbness 07/14/2016  . Weakness of left side of body 07/14/2016   [name] T [name], OTR/L, CLT  [name],[name] 01/03/2017, 2:35 PM  [place] MAIN [facility] SERVICES [address] [place], [place], [address] Phone: [phone]   Fax:  [phone]  Name: Jason Oconnor MRN: [id number] Date of Birth: [birth year]

## 2017-01-03 NOTE — Therapy (Signed)
[place] MAIN [facility] SERVICES [address] [place], [place], [address] Phone: [phone]   Fax:  [phone]  Physical Therapy Treatment  Patient Details  Name: Jason Oconnor MRN: [id number] Date of Birth: Apr 10, 1956 Referring Provider: Dr. Naaman Plummer  Encounter Date: 01/03/2017      PT End of Session - 01/03/17 0859    Visit Number 37   Number of Visits 53   Date for PT Re-Evaluation 02/12/17   Authorization Type no g codes   PT Start Time 0800   PT Stop Time 0845   PT Time Calculation (min) 45 min   Equipment Utilized During Treatment Gait belt   Activity Tolerance Patient tolerated treatment well   Behavior During Therapy [facility] for tasks assessed/performed      Past Medical History:  Diagnosis Date  . Bone marrow disease   . Hypertension     History reviewed. No pertinent surgical history.  There were no vitals filed for this visit.      Subjective Assessment - 01/03/17 0809    Subjective Patient reports he has to work on paperwork for his long term disability. States he is unable to lift his toes on his L LE.   Patient is accompained by: --  wife   Pertinent History Patient is a 61 year old right-handed male with history of hypertension. Per chart review patient is married. 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 Presented to [facility] 07/14/2016 with left-sided weakness and facial droop. Blood pressure elevated 170/106. Head CT scan negative. 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 CTA of the head showed no emergent large vessel occlusion or severe stenosis. Carotid Dopplers with no ICA stenosis. Patient did not receive TPA. Echocardiogram with ejection fraction of 60%. Normal systolic function. Placed on aspirin and Plavix for CVA prophylaxis.  Subcutaneous Lovenox for DVT prophylaxis.  Patient transferred to CIR on 07/16/2016. Pt discharged form CIR on 08/03/16. Pt reports that he made progress at CIR. No reported problems since returning home. Currently using a wide base quad cane in RUE and L AFO. Pt reports that he has made the last progress with respect to his LUE strength.    Patient Stated Goals "Get back as close as I can to normal." Pt would to be able to play golf. Pt would like to return to some kind of work.       TREATMENT:  Modalities  NMES pulse width 380uS, Intensity 44, Rate: 50Hz , waveform: asymmetrical 8 minutes total time. 15 sec on/ 30 sec off against with pad placed along peroneal musculature. Active Eversion performed throughout entirety of motion during activation of electrical signal.    Therapeutic Exercise: Manual resistance against - inversion/eversion on L LE 2 x 12 Inversion/eversion with mirror - 3 x 10 (Improved muscular activation and AROM after mirror placed in between legs) Heel raises on half blue foam roller - x 25 Sit to stands without UE support - x25 Dynadisc single leg support with B UE support - ant/post, medial/lateral, circles cw/ccw x20 each direction Tandem stance on half foam roller - 2 x 34min       PT Education - 01/03/17 0857    Education provided Yes   Education Details muscle physiology, eductation of proper coordination of musculature   Person(s) Educated Patient   Methods Explanation;Demonstration   Comprehension Verbalized understanding;Returned  demonstration             PT Long Term Goals - 12/18/16 1648      PT LONG TERM GOAL #1   Title Pt will be independent with HEP in order to improve strength and balance in order to decrease fall risk and improve function at home and work.   Baseline 10/30/16: Mod-min A for exericise progression/ performance 12/18/16: Requires minA for technique with exercise   Time 6   Period Weeks   Status On-going     PT LONG TERM GOAL #2    Title Pt will improve BERG by at least 3 points in order to demonstrate clinically significant improvement in balance.   Baseline 08/07/16: 49/56 10/02/16: 53/56   Time 8   Period Weeks   Status Achieved     PT LONG TERM GOAL #3   Title Pt will decrease TUG to below 14 seconds in order to demonstrate decreased fall risk    Baseline 08/07/16: 21.9 seconds; 10/02/16: 12.3    Time 8   Period Weeks   Status Achieved     PT LONG TERM GOAL #4   Title Pt will decrease 5TSTS by at least 3 seconds in order to demonstrate clinically significant improvement in LE strength   Baseline 08/07/16: 18.5 seconds; 10/02/16: 14.3   Time 8   Period Weeks   Status Achieved     PT LONG TERM GOAL #5   Title Pt will increase 10MWT by at least 0.13 m/s in order to demonstrate clinically significant improvement in community ambulation   Baseline 08/07/16: 0.53 m/s 10/02/16: .97 m/s   Time 8   Period Weeks   Status Achieved     Additional Long Term Goals   Additional Long Term Goals Yes     PT LONG TERM GOAL #6   Title Pt will be able to swing a golf club safely in order to complete 9 holes of golf   Baseline 10/30/16: Unable to swing golf club safely 12/19/15: unable to swing golf club   Time 6   Period Weeks   Status On-going     PT LONG TERM GOAL #7   Title Pt will improve 6MWT by at least 135ft  to demonstrate improved aerobic capacity/endurance   Baseline 582 ft 10/02/16: 1127ft   Time 7   Period Weeks   Status Achieved     PT LONG TERM GOAL #8   Title Patient will improve DGI by 4 points to demonstrate improvement in dynamic gait/balance and decrease fall risk   Baseline 10/02/16: 19/24; 10/30/16: 21/24 12/18/16: 23/24   Time 6   Period Weeks   Status Achieved     PT LONG TERM GOAL  #9   TITLE Patient will demonstrate ability to perform single leg stance for >10 sec bilaterally to better be able to climb ladders   Baseline L LE SLS: 3 sec   Time 8   Period Weeks   Status New     PT LONG TERM  GOAL  #10   TITLE Patient will be able to tandem walk 9ft without loss of balance to better perform dynamic balance activities to perform occupational duties.   Baseline Requires stepping responses to maintain balance with tandem walking    Time 8   Period Weeks   Status New               Plan - 01/03/17 0902    Clinical Impression Statement Continued to perform mirror  therapy to improve peroneal and toe extension muscular activation. Patient demonstrates improvement in ankle eversion in standing under dynadisc indicating functional carryover and patient will benefit from further skilled therapy focused on improving current limitations to return to prior level of function.    Rehab Potential Good   Clinical Impairments Affecting Rehab Potential Positive: motivation, prior functional level; Negative: no active dorsiflexion, increased LUE tone   PT Frequency 2x / week   PT Duration 6 weeks   PT Treatment/Interventions ADLs/Self Care Home Management;Biofeedback;Canalith Repostioning;Cryotherapy;Electrical Stimulation;Iontophoresis 4mg /ml Dexamethasone;Aquatic Therapy;Moist Heat;Traction;Ultrasound;Gait training;Stair training;Functional mobility training;Therapeutic activities;DME Instruction;Therapeutic exercise;Balance training;Neuromuscular re-education;Cognitive remediation;Patient/family education;Orthotic Fit/Training;Manual techniques;Passive range of motion;Energy conservation;Taping;Vestibular   PT Next Visit Plan Clamshell; DGI, LEFS, NME for L anterior tibialis, Walk-aide, progress balance, strengthening, and HEP; Consider sidelying hip abduction exercises   PT Home Exercise Plan Sit to stand, seated tband resisted clams, seated adduction squeeze   Consulted and Agree with Plan of Care Patient      Patient will benefit from skilled therapeutic intervention in order to improve the following deficits and impairments:  Abnormal gait, Decreased balance, Decreased coordination,  Decreased mobility, Decreased range of motion, Decreased strength, Difficulty walking, Impaired tone, Impaired UE functional use  Visit Diagnosis: Muscle weakness (generalized)  Other lack of coordination  Unsteadiness on feet     Problem List Patient Active Problem List   Diagnosis Date Noted  . 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 Neurocognitive disorder   . Left foot drop   . Benign essential HTN   . Hemiparesis affecting nondominant side as late effect of cerebrovascular accident (CVA) ([place]) 07/18/2016  . Right basal ganglia embolic stroke ([place]) [id number]  . CVA (cerebral infarction) 07/15/2016  . TIA (transient ischemic attack) 07/14/2016  . HTN, goal below 140/80 07/14/2016  . Left facial numbness 07/14/2016  . Weakness of left side of body 07/14/2016    Blythe Stanford, PT DPT 01/03/2017, 9:06 AM  [place] MAIN [place] [facility] SERVICES [address] [place], [place], [address] Phone: [phone]   Fax:  [phone]  Name: Jalan Kina MRN: [id number] Date of Birth: 02/18/1956

## 2017-01-07 ENCOUNTER — Encounter: Payer: 59 | Admitting: Occupational Therapy

## 2017-01-10 ENCOUNTER — Ambulatory Visit: Payer: 59 | Admitting: Occupational Therapy

## 2017-01-10 ENCOUNTER — Encounter: Payer: 59 | Admitting: Occupational Therapy

## 2017-01-10 ENCOUNTER — Ambulatory Visit: Payer: 59

## 2017-01-10 DIAGNOSIS — R278 Other lack of coordination: Secondary | ICD-10-CM | POA: Diagnosis not present

## 2017-01-10 DIAGNOSIS — M6281 Muscle weakness (generalized): Secondary | ICD-10-CM

## 2017-01-10 DIAGNOSIS — R2681 Unsteadiness on feet: Secondary | ICD-10-CM

## 2017-01-10 NOTE — Therapy (Signed)
[place] MAIN [facility] SERVICES [address] [place], [place], [address] Phone: [phone]   Fax:  [phone]  Physical Therapy Treatment  Patient Details  Name: Jason Oconnor MRN: [id number] Date of Birth: Jul 02, 1956 Referring Provider: Dr. Naaman Plummer  Encounter Date: 01/10/2017      PT End of Session - 01/10/17 1003    Visit Number 38   Number of Visits 53   Date for PT Re-Evaluation 02/12/17   Authorization Type no g codes   PT Start Time 0930   PT Stop Time 1015   PT Time Calculation (min) 45 min   Equipment Utilized During Treatment Gait belt   Activity Tolerance Patient tolerated treatment well   Behavior During Therapy [facility] for tasks assessed/performed      Past Medical History:  Diagnosis Date  . Bone marrow disease   . Hypertension     History reviewed. No pertinent surgical history.  There were no vitals filed for this visit.      Subjective Assessment - 01/10/17 0941    Subjective Patient reports he wants to start stretching out his ankle to help with his mobility. Reports his toes on his L LE flex when he walks.   Patient is accompained by: --  wife   Pertinent History Patient is a 61 year old right-handed male with history of hypertension. Per chart review patient is married. 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 Presented to [facility] 07/14/2016 with left-sided weakness and facial droop. Blood pressure elevated 170/106. Head CT scan negative. 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 CTA of the head showed no emergent large vessel occlusion or severe stenosis. Carotid Dopplers with no ICA stenosis. Patient did not receive TPA. Echocardiogram with ejection fraction of 60%. Normal systolic function. Placed on aspirin and Plavix for CVA  prophylaxis. Subcutaneous Lovenox for DVT prophylaxis.  Patient transferred to CIR on 07/16/2016. Pt discharged form CIR on 08/03/16. Pt reports that he made progress at CIR. No reported problems since returning home. Currently using a wide base quad cane in RUE and L AFO. Pt reports that he has made the last progress with respect to his LUE strength.    Patient Stated Goals "Get back as close as I can to normal." Pt would to be able to play golf. Pt would like to return to some kind of work.    Currently in Pain? No/denies        TREATMENT:  Modalities  NMES pulse width 380uS, Intensity 44, Rate: 50Hz , waveform: asymmetrical 8 minutes total time. 15 sec on/ 30 sec off against with pad placed along peroneal musculature. Active Eversion performed throughout entirety of motion during activation of electrical signal.    Therapeutic Exercise: Manual resistance against - inversion/eversion on L LE 2 x 12 Tandem Ambulation on half foam roller - x 12 down and back  Heel raises on half blue foam roller - 2 x 15 Dynadisc single leg support with B UE support - ant/post, medial/lateral, circles cw/ccw x20 each direction Stepping lunges onto dynadisc from airex pad -  x 15 Tandem balance on airex pad with rotations with 4# weight - x20        PT Education - 01/10/17 1003    Education provided Yes   Education Details Form/technique throughout exercise performance   Person(s) Educated Patient  Methods Explanation;Demonstration   Comprehension Verbalized understanding;Returned demonstration             PT Long Term Goals - 12/18/16 1648      PT LONG TERM GOAL #1   Title Pt will be independent with HEP in order to improve strength and balance in order to decrease fall risk and improve function at home and work.   Baseline 10/30/16: Mod-min A for exericise progression/ performance 12/18/16: Requires minA for technique with exercise   Time 6   Period Weeks   Status On-going     PT LONG TERM  GOAL #2   Title Pt will improve BERG by at least 3 points in order to demonstrate clinically significant improvement in balance.   Baseline 08/07/16: 49/56 10/02/16: 53/56   Time 8   Period Weeks   Status Achieved     PT LONG TERM GOAL #3   Title Pt will decrease TUG to below 14 seconds in order to demonstrate decreased fall risk    Baseline 08/07/16: 21.9 seconds; 10/02/16: 12.3    Time 8   Period Weeks   Status Achieved     PT LONG TERM GOAL #4   Title Pt will decrease 5TSTS by at least 3 seconds in order to demonstrate clinically significant improvement in LE strength   Baseline 08/07/16: 18.5 seconds; 10/02/16: 14.3   Time 8   Period Weeks   Status Achieved     PT LONG TERM GOAL #5   Title Pt will increase 10MWT by at least 0.13 m/s in order to demonstrate clinically significant improvement in community ambulation   Baseline 08/07/16: 0.53 m/s 10/02/16: .97 m/s   Time 8   Period Weeks   Status Achieved     Additional Long Term Goals   Additional Long Term Goals Yes     PT LONG TERM GOAL #6   Title Pt will be able to swing a golf club safely in order to complete 9 holes of golf   Baseline 10/30/16: Unable to swing golf club safely 12/19/15: unable to swing golf club   Time 6   Period Weeks   Status On-going     PT LONG TERM GOAL #7   Title Pt will improve 6MWT by at least 123ft  to demonstrate improved aerobic capacity/endurance   Baseline 582 ft 10/02/16: 1134ft   Time 7   Period Weeks   Status Achieved     PT LONG TERM GOAL #8   Title Patient will improve DGI by 4 points to demonstrate improvement in dynamic gait/balance and decrease fall risk   Baseline 10/02/16: 19/24; 10/30/16: 21/24 12/18/16: 23/24   Time 6   Period Weeks   Status Achieved     PT LONG TERM GOAL  #9   TITLE Patient will demonstrate ability to perform single leg stance for >10 sec bilaterally to better be able to climb ladders   Baseline L LE SLS: 3 sec   Time 8   Period Weeks   Status New     PT  LONG TERM GOAL  #10   TITLE Patient will be able to tandem walk 33ft without loss of balance to better perform dynamic balance activities to perform occupational duties.   Baseline Requires stepping responses to maintain balance with tandem walking    Time 8   Period Weeks   Status New               Plan - 01/10/17 1011  Clinical Impression Statement Patient demonstrates improved ankle eversion in sitting compared to previous visits indicating functional carryover  between visits. Continued to focus on improving ankle muscular activation and stabilization and patient continues to demonstrate increased postural sway and patient will benefit from further skilled therapy to return to prior level of function.    Rehab Potential Good   Clinical Impairments Affecting Rehab Potential Positive: motivation, prior functional level; Negative: no active dorsiflexion, increased LUE tone   PT Frequency 2x / week   PT Duration 6 weeks   PT Treatment/Interventions ADLs/Self Care Home Management;Biofeedback;Canalith Repostioning;Cryotherapy;Electrical Stimulation;Iontophoresis 4mg /ml Dexamethasone;Aquatic Therapy;Moist Heat;Traction;Ultrasound;Gait training;Stair training;Functional mobility training;Therapeutic activities;DME Instruction;Therapeutic exercise;Balance training;Neuromuscular re-education;Cognitive remediation;Patient/family education;Orthotic Fit/Training;Manual techniques;Passive range of motion;Energy conservation;Taping;Vestibular   PT Next Visit Plan Clamshell; DGI, LEFS, NME for L anterior tibialis, Walk-aide, progress balance, strengthening, and HEP; Consider sidelying hip abduction exercises   PT Home Exercise Plan Sit to stand, seated tband resisted clams, seated adduction squeeze   Consulted and Agree with Plan of Care Patient      Patient will benefit from skilled therapeutic intervention in order to improve the following deficits and impairments:  Abnormal gait, Decreased  balance, Decreased coordination, Decreased mobility, Decreased range of motion, Decreased strength, Difficulty walking, Impaired tone, Impaired UE functional use  Visit Diagnosis: Muscle weakness (generalized)  Other lack of coordination  Unsteadiness on feet     Problem List Patient Active Problem List   Diagnosis Date Noted  . 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 Neurocognitive disorder   . Left foot drop   . Benign essential HTN   . Hemiparesis affecting nondominant side as late effect of cerebrovascular accident (CVA) ([place]) 07/18/2016  . Right basal ganglia embolic stroke ([place]) [id number]  . CVA (cerebral infarction) 07/15/2016  . TIA (transient ischemic attack) 07/14/2016  . HTN, goal below 140/80 07/14/2016  . Left facial numbness 07/14/2016  . Weakness of left side of body 07/14/2016    Blythe Stanford, PT DPT 01/10/2017, 10:16 AM  [place] MAIN [facility] SERVICES [address] [place], [place], [address] Phone: [phone]   Fax:  [phone]  Name: Jason Oconnor MRN: [id number] Date of Birth: 12-10-1955

## 2017-01-11 ENCOUNTER — Encounter: Payer: Self-pay | Admitting: Occupational Therapy

## 2017-01-11 NOTE — Therapy (Signed)
[place] MAIN [facility] SERVICES [address] [place], [place], [address] Phone: [phone]   Fax:  [phone]  Occupational Therapy Treatment  Patient Details  Name: Jason Oconnor MRN: [id number] Date of Birth: 12-01-1955 No Data Recorded  Encounter Date: 01/10/2017      OT End of Session - 01/11/17 1542    Visit Number 39   Number of Visits 1   Date for OT Re-Evaluation 01/24/17   Authorization Type 10 of 20 visits for 2018 calendar year   OT Start Time 0830   OT Stop Time 0916   OT Time Calculation (min) 46 min   Activity Tolerance Patient tolerated treatment well   Behavior During Therapy [facility] for tasks assessed/performed      Past Medical History:  Diagnosis Date  . Bone marrow disease   . Hypertension     History reviewed. No pertinent surgical history.  There were no vitals filed for this visit.      Subjective Assessment - 01/11/17 1540    Subjective  Patient reports he did well last weekend when his wife was away, did not go out much due to bad weather.  He has been doing housekeeping as much as possible and also cooking at times.    Pertinent History Patient reports on July 13, 2016 he was helping a friend doing home repairs and felt tired, went home and started to notice problems moving around.  The next morning he feel trying to put his slippers on, wife transported him to ER and [facility] for 3 days and then went to [facility] for REhab and was discharged this past Saturday to home.     Patient Stated Goals "I want to get back to close as normal as possible, play golf again, work in the garage."     Currently in Pain? No/denies   Pain Score 0-No pain                      OT Treatments/Exercises (OP) - 01/11/17 1613      Fine Motor Coordination   Other Fine Motor Exercises Patient seen this date for focus on speed and dexterity of flipping 100 pegboard, 2 rows only with cues for isolated finger movements to  produce desired effect.  Patient dropping items occasionally and reports frustration at times with task especially under timed pressure.  2 rows in 3 min 20 sec, 2nd trial 3 minutes 53 sec. Manipulation of grooved pegboard with cues for prehension patterns and movements to manipulate pieces.      Neurological Re-education Exercises   Other Exercises 1 Reaching tasks with LUE to place and remove items from top shelf of cabinet, cues for form and technique. Patient completed UBE 6 minutes forwards/backwards and alternating levels of resistance 3.8 to 4.0.                  OT Education - 01/11/17 1542    Education provided Yes   Education Details reaching tasks, form with reach to decrease substitution of movements   Person(s) Educated Patient   Methods Explanation;Demonstration;Verbal cues   Comprehension Verbal cues required;Returned demonstration;Verbalized understanding             OT Long Term Goals - 01/03/17 1434      OT LONG TERM GOAL #1   Title Patient to improve left UE coordination sufficient to tie shoes with modified independence.    Baseline unable   Time 12  Period Weeks   Status On-going     OT LONG TERM GOAL #2   Title Patient to improve LUE coordination to complete buttons, snaps and zippers on clothing with modified independence.   Time 12   Period Weeks   Status On-going     OT LONG TERM GOAL #3   Title Patient to complete bathing with modified independence including using left arm to wash right side of body.    Time 12   Period Weeks   Status Achieved     OT LONG TERM GOAL #4   Title Patient to improve left hand use to type on computer using both hands on home row keys, 2 lines with no errors in 5 minutes.    Baseline unable   Time 12   Period Weeks   Status On-going     OT LONG TERM GOAL #5   Title Patient will improve grip on left hand by 10# to stabilize jars and containers for opening.    Baseline unable   Time 12   Period Weeks    Status On-going     OT LONG TERM GOAL #6   Title Patient will be able to demonstrate sweeping with use of bilateral UEs with modified independence.    Baseline unable   Time 12   Period Weeks   Status Partially Met     OT LONG TERM GOAL #7   Title Patient will improve grip strength in left hand to be able to stabilize utensils to cut meat with modified independence.    Baseline unable    Time 12   Period Weeks   Status Partially Met     OT LONG TERM GOAL #8   Title Patient will be able to demonstrate increased strength in LUE to reach into microwave and retrieve plate of food with modified independence.   Baseline unable.    Time 12   Period Weeks   Status On-going     OT LONG TERM GOAL  #9   Baseline Patient will improve left UE control and reach to operate left turning signal in the car with modified independence.    Time 12   Period Weeks   Status Partially Met     OT LONG TERM GOAL  #10   TITLE Patient will be able to hold and manage large brush to wash his truck with modified independence.    Baseline unable   Time 12   Period Weeks   Status On-going               Plan - 01/11/17 1543    Rehab Potential Good   OT Frequency 2x / week   OT Duration 12 weeks   OT Treatment/Interventions Self-care/ADL training;Therapeutic exercise;Neuromuscular education;Visual/perceptual remediation/compensation;Therapist, nutritional;Therapeutic exercises;Patient/family education;DME and/or AE instruction;Manual Therapy;Therapeutic activities;Balance training   Consulted and Agree with Plan of Care Patient      Patient will benefit from skilled therapeutic intervention in order to improve the following deficits and impairments:  Decreased knowledge of use of DME, Decreased coordination, Impaired sensation, Decreased activity tolerance, Decreased range of motion, Decreased strength, Decreased balance, Difficulty walking, Impaired UE functional use  Visit  Diagnosis: Muscle weakness (generalized)  Other lack of coordination    Problem List Patient Active Problem List   Diagnosis Date Noted  . 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 Neurocognitive disorder   . Left foot drop   . Benign essential HTN   . Hemiparesis affecting nondominant side as late  effect of cerebrovascular accident (CVA) ([place]) 07/18/2016  . Right basal ganglia embolic stroke ([place]) 71/41/0677  . CVA (cerebral infarction) 07/15/2016  . TIA (transient ischemic attack) 07/14/2016  . HTN, goal below 140/80 07/14/2016  . Left facial numbness 07/14/2016  . Weakness of left side of body 07/14/2016   Teyla Skidgel Oneita Jolly, OTR/L, CLT  Harlan Ervine 01/11/2017, 4:18 PM  [place] MAIN [facility] SERVICES [address] [place], [place], [address] Phone: [phone]   Fax:  [phone]  Name: Jason Oconnor MRN: [id number] Date of Birth: 04-10-1956

## 2017-01-13 ENCOUNTER — Ambulatory Visit (HOSPITAL_BASED_OUTPATIENT_CLINIC_OR_DEPARTMENT_OTHER): Payer: 59 | Admitting: Physical Medicine & Rehabilitation

## 2017-01-13 ENCOUNTER — Encounter: Payer: 59 | Attending: Physical Medicine & Rehabilitation

## 2017-01-13 ENCOUNTER — Encounter: Payer: Self-pay | Admitting: Physical Medicine & Rehabilitation

## 2017-01-13 VITALS — BP 137/88 | HR 80 | Resp 14

## 2017-01-13 DIAGNOSIS — M21372 Foot drop, left foot: Secondary | ICD-10-CM | POA: Insufficient documentation

## 2017-01-13 DIAGNOSIS — R269 Unspecified abnormalities of gait and mobility: Secondary | ICD-10-CM | POA: Diagnosis not present

## 2017-01-13 DIAGNOSIS — I69398 Other sequelae of cerebral infarction: Secondary | ICD-10-CM | POA: Diagnosis not present

## 2017-01-13 DIAGNOSIS — I69359 Hemiplegia and hemiparesis following cerebral infarction affecting unspecified side: Secondary | ICD-10-CM | POA: Diagnosis not present

## 2017-01-13 DIAGNOSIS — R531 Weakness: Secondary | ICD-10-CM | POA: Diagnosis present

## 2017-01-13 DIAGNOSIS — R279 Unspecified lack of coordination: Secondary | ICD-10-CM | POA: Diagnosis not present

## 2017-01-13 DIAGNOSIS — I10 Essential (primary) hypertension: Secondary | ICD-10-CM | POA: Insufficient documentation

## 2017-01-13 DIAGNOSIS — I69354 Hemiplegia and hemiparesis following cerebral infarction affecting left non-dominant side: Secondary | ICD-10-CM | POA: Diagnosis not present

## 2017-01-13 NOTE — Progress Notes (Signed)
Subjective:    Patient ID: Jason Oconnor, male    DOB: 01/17/1956, 61 y.o.   MRN: [id number]  HPI [place] on Sat 2/17, Left shoulder and low back a little sore, no other ijury.  Fell from chair Still cleaning and doing dishes around the house  now on LT disability OT once a week Patient is driving  Pain Inventory Average Pain 1 Pain Right Now 1 My pain is intermittent and sharp  In the last 24 hours, has pain interfered with the following? General activity 2 Relation with others 0 Enjoyment of life 4 What TIME of day is your pain at its worst? n/a Sleep (in general) Fair  Pain is worse with: inactivity and some activites Pain improves with: therapy/exercise Relief from Meds: n/a  Mobility walk with assistance use a cane how many minutes can you walk? 20 do you drive?  yes  Function disabled: date disabled .  Neuro/Psych numbness spasms  Prior Studies Any changes since last visit?  no  Physicians involved in your care Any changes since last visit?  no   History reviewed. No pertinent family history. Social History   Social History  . Marital status: Married    Spouse name: N/A  . Number of children: N/A  . Years of education: N/A   Social History Main Topics  . Smoking status: Former Research scientist (life sciences)  . Smokeless tobacco: Never Used  . Alcohol use Yes     Comment: rare  . Drug use: No  . Sexual activity: Not Asked   Other Topics Concern  . None   Social History Narrative  . None   History reviewed. No pertinent surgical history. Past Medical History:  Diagnosis Date  . Bone marrow disease   . Hypertension    BP 137/88   Pulse 80   Resp 14   SpO2 96%   Opioid Risk Score:   Fall Risk Score:  `1  Depression screen PHQ 2/9  Depression screen PHQ 2/9 08/13/2016  Decreased Interest 0  Down, Depressed, Hopeless 0  PHQ - 2 Score 0  Altered sleeping 0  Tired, decreased energy 1  Change in appetite 0  Feeling bad or failure about yourself  0    Trouble concentrating 0  Moving slowly or fidgety/restless 1  Suicidal thoughts 0  PHQ-9 Score 2  Difficult doing work/chores Not difficult at all    Review of Systems  Constitutional: Negative.   HENT: Negative.   Eyes: Negative.   Respiratory: Negative.   Cardiovascular: Negative.   Gastrointestinal: Negative.   Endocrine: Negative.   Genitourinary: Negative.   Musculoskeletal:       Spasms   Skin: Negative.   Allergic/Immunologic: Negative.   Neurological: Positive for numbness.  Hematological: Negative.   All other systems reviewed and are negative.      Objective:   Physical Exam  Constitutional: He is oriented to person, place, and time. He appears well-developed and well-nourished.  HENT:  Head: Normocephalic and atraumatic.  Eyes: Conjunctivae and EOM are normal. Pupils are equal, round, and reactive to light.  Neck: Normal range of motion.  Neurological: He is alert and oriented to person, place, and time. Coordination abnormal.  Psychiatric: He has a normal mood and affect. His behavior is normal. Thought content normal.  Nursing note and vitals reviewed.   No acute distress, mood and affect are appropriate. Ambulates with left AFO and straight cane. No evidence to drag or knee instability. Extremities without edema. Motor strength is  4/5 in the left deltoid, biceps, triceps, grip, 4 minus at the left knee extensor, 3 minus at the left knee flexor, 3 minus at the left hip flexor, ankle dorsiflexor not tested due to AFO. Modified Ashworth score of 1 at the biceps, 1 at the finger flexors, 3 at the pronator. This is on the left side. Tone is normal on the right side.     Assessment & Plan:  1. Right CVA with left spastic hemi-plegia. 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 He still has problems with balance and requires a cane and AFO for  ambulation. Therefore, I do think a permanent handicap placard for the car would be appropriate. Continue out patient OT Recommend Botox 50 units to pronator teres, 50 units to pronator quadratus Patient will think about this talk to his wife. I will see him in 2 months  2. Left hip flexor weakness compounded by some tightness in the hip extensors. Recommend knee to chest stretching on the left side, 1 minute twice a day. We discussed this as well as demonstrated.  3. Hypertension. Follow-up with PCP  4. Neurocognitive disorder, may follow up with neuropsych once insurance issues get straightened out

## 2017-01-13 NOTE — Patient Instructions (Signed)
Botox to the Left pronator teres and quadratus recommended

## 2017-01-14 ENCOUNTER — Encounter: Payer: 59 | Admitting: Occupational Therapy

## 2017-01-14 ENCOUNTER — Telehealth: Payer: Self-pay | Admitting: *Deleted

## 2017-01-14 DIAGNOSIS — I69359 Hemiplegia and hemiparesis following cerebral infarction affecting unspecified side: Secondary | ICD-10-CM

## 2017-01-14 DIAGNOSIS — R269 Unspecified abnormalities of gait and mobility: Secondary | ICD-10-CM

## 2017-01-14 NOTE — Telephone Encounter (Signed)
Orders placed for FCE eval @ [facility]

## 2017-01-17 ENCOUNTER — Ambulatory Visit: Payer: 59

## 2017-01-17 ENCOUNTER — Encounter: Payer: 59 | Admitting: Occupational Therapy

## 2017-01-17 ENCOUNTER — Ambulatory Visit: Payer: 59 | Admitting: Occupational Therapy

## 2017-01-17 ENCOUNTER — Encounter: Payer: Self-pay | Admitting: Occupational Therapy

## 2017-01-17 DIAGNOSIS — R278 Other lack of coordination: Secondary | ICD-10-CM | POA: Diagnosis not present

## 2017-01-17 DIAGNOSIS — M6281 Muscle weakness (generalized): Secondary | ICD-10-CM

## 2017-01-17 NOTE — Therapy (Signed)
[facility] SERVICES [address] [place], [place], [address] Phone: [phone]   Fax:  [phone]  Occupational Therapy Treatment  Patient Details  Name: Jason Oconnor MRN: [id number] Date of Birth: 08/05/1956 No Data Recorded  Encounter Date: 01/17/2017      OT End of Session - 01/17/17 1506    Visit Number 40   Number of Visits 48   Date for OT Re-Evaluation 01/24/17   Authorization Type 11 of 20 visits for 2018 calendar year   OT Start Time 0829   OT Stop Time 0915   OT Time Calculation (min) 46 min   Activity Tolerance Patient tolerated treatment well   Behavior During Therapy WFL for tasks assessed/performed      Past Medical History:  Diagnosis Date  . Bone marrow disease   . Hypertension     History reviewed. No pertinent surgical history.  There were no vitals filed for this visit.      Subjective Assessment - 01/17/17 1457    Subjective  Patient reports he is still trying to get his disability worked out.  Reports he got a call for scheduling a FCE in [place], however he is not sure who is requesting the FCE and who is paying for it.  He is going to call Dr. Kirsteins office to see if he can get a name to call and get more information.     Pertinent History Patient reports on July 13, 2016 he was helping a friend doing home repairs and felt tired, went home and started to notice problems moving around.  The next morning he feel trying to put his slippers on, wife transported him to ER and [facility] for 3 days and then went to [facility] for REhab and was discharged this past Saturday to home.     Patient Stated Goals "I want to get back to close as normal as possible, play golf again, work in the garage."     Currently in Pain? No/denies   Pain Score 0-No pain                      OT Treatments/Exercises (OP) - 01/17/17 1459      Fine Motor Coordination   Other Fine Motor Exercises Patient seen for  fine motor coordination skills of manipulation of pieces of [place] with small washers, dowels and collars with cues for assembly, prehension patterns, using the hand for storage and translatory movements of the left hand. Supination of the left hand with use of stick as a visual cue for patient to offer feedback for form, technique and how far he has to go for full motion.  Cues for movement.       Neurological Re-education Exercises   Other Exercises 1 Patient seen for LUE strengthening task with UBE for 6 minutes forwards/backwards alternating levels of resistance of 4.0 to 4.3 with therapist in constant attendance to ensure grip and to adjust settings and provide direction. Grip strengthening 17.9# for 25 reps for 1 set with cues for form, technique and to identify need for rest breaks.                 OT Education - 01/17/17 1506    Education provided Yes   Education Details posture and position with supination tasks   Person(s) Educated Patient   Methods Explanation;Demonstration;Verbal cues   Comprehension Verbal cues required;Returned demonstration;Verbalized understanding               OT Long Term Goals - 01/03/17 1434      OT LONG TERM GOAL #1   Title Patient to improve left UE coordination sufficient to tie shoes with modified independence.    Baseline unable   Time 12   Period Weeks   Status On-going     OT LONG TERM GOAL #2   Title Patient to improve LUE coordination to complete buttons, snaps and zippers on clothing with modified independence.   Time 12   Period Weeks   Status On-going     OT LONG TERM GOAL #3   Title Patient to complete bathing with modified independence including using left arm to wash right side of body.    Time 12   Period Weeks   Status Achieved     OT LONG TERM GOAL #4   Title Patient to improve left hand use to type on computer using both hands on home row keys, 2 lines with no errors in 5 minutes.    Baseline unable   Time 12    Period Weeks   Status On-going     OT LONG TERM GOAL #5   Title Patient will improve grip on left hand by 10# to stabilize jars and containers for opening.    Baseline unable   Time 12   Period Weeks   Status On-going     OT LONG TERM GOAL #6   Title Patient will be able to demonstrate sweeping with use of bilateral UEs with modified independence.    Baseline unable   Time 12   Period Weeks   Status Partially Met     OT LONG TERM GOAL #7   Title Patient will improve grip strength in left hand to be able to stabilize utensils to cut meat with modified independence.    Baseline unable    Time 12   Period Weeks   Status Partially Met     OT LONG TERM GOAL #8   Title Patient will be able to demonstrate increased strength in LUE to reach into microwave and retrieve plate of food with modified independence.   Baseline unable.    Time 12   Period Weeks   Status On-going     OT LONG TERM GOAL  #9   Baseline Patient will improve left UE control and reach to operate left turning signal in the car with modified independence.    Time 12   Period Weeks   Status Partially Met     OT LONG TERM GOAL  #10   TITLE Patient will be able to hold and manage large brush to wash his truck with modified independence.    Baseline unable   Time 12   Period Weeks   Status On-going               Plan - 01/17/17 1504    Clinical Impression Statement Patient demonstrates difficulty with translatory movements of the left hand and using the hand for storage with small objects of the [place].  He also requires cues for posture when performing supination exercise but is showing signs of self correction when he tends to compensate by leaning his trunk to the left in attempts to achieve more motion.  Continue to work towards goals to improve left UE use for functional tasks and to achieve goals of greater independence at home and in the community.     Rehab Potential Good   OT Frequency 2x /  week   OT Duration  12 weeks   OT Treatment/Interventions Self-care/ADL training;Therapeutic exercise;Neuromuscular education;Visual/perceptual remediation/compensation;Functional Mobility Training;Therapeutic exercises;Patient/family education;DME and/or AE instruction;Manual Therapy;Therapeutic activities;Balance training   Consulted and Agree with Plan of Care Patient      Patient will benefit from skilled therapeutic intervention in order to improve the following deficits and impairments:  Decreased knowledge of use of DME, Decreased coordination, Impaired sensation, Decreased activity tolerance, Decreased range of motion, Decreased strength, Decreased balance, Difficulty walking, Impaired UE functional use  Visit Diagnosis: Muscle weakness (generalized)  Other lack of coordination    Problem List Patient Active Problem List   Diagnosis Date Noted  . 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 Neurocognitive disorder   . Left foot drop   . Benign essential HTN   . Hemiparesis affecting nondominant side as late effect of cerebrovascular accident (CVA) ([facility]) 07/18/2016  . Right basal ganglia embolic stroke ([facility]) 07/16/2016  . CVA (cerebral infarction) 07/15/2016  . TIA (transient ischemic attack) 07/14/2016  . HTN, goal below 140/80 07/14/2016  . Left facial numbness 07/14/2016  . Weakness of left side of body 07/14/2016   [name] T [name], OTR/L, CLT  [name],[name] 01/17/2017, 3:07 PM   [facility] RE[facility] SERVICES [address] [place], [place], [address] Phone: [phone]   Fax:  [phone]  Name: Jason Oconnor MRN: [id number] Date of Birth: 08/20/1956 

## 2017-01-20 ENCOUNTER — Encounter: Payer: 59 | Admitting: Occupational Therapy

## 2017-01-23 ENCOUNTER — Encounter: Payer: 59 | Admitting: Occupational Therapy

## 2017-01-24 ENCOUNTER — Encounter: Payer: Self-pay | Admitting: Occupational Therapy

## 2017-01-24 ENCOUNTER — Ambulatory Visit: Payer: 59 | Attending: Physical Medicine & Rehabilitation | Admitting: Physical Therapy

## 2017-01-24 ENCOUNTER — Encounter: Payer: Self-pay | Admitting: Physical Therapy

## 2017-01-24 ENCOUNTER — Ambulatory Visit: Payer: 59 | Admitting: Occupational Therapy

## 2017-01-24 VITALS — BP 136/84

## 2017-01-24 DIAGNOSIS — R2681 Unsteadiness on feet: Secondary | ICD-10-CM | POA: Diagnosis present

## 2017-01-24 DIAGNOSIS — R278 Other lack of coordination: Secondary | ICD-10-CM | POA: Diagnosis present

## 2017-01-24 DIAGNOSIS — M6281 Muscle weakness (generalized): Secondary | ICD-10-CM | POA: Diagnosis present

## 2017-01-24 NOTE — Therapy (Signed)
[place] MAIN [facility] SERVICES [address] [place], [place], [address] Phone: [phone]   Fax:  [phone]  Physical Therapy Treatment  Patient Details  Name: Jason Oconnor MRN: [id number] Date of Birth: 10/02/1956 Referring Provider: Dr. Naaman Plummer  Encounter Date: 01/24/2017      PT End of Session - 01/24/17 0757    Visit Number 39   Number of Visits 53   Date for PT Re-Evaluation 02/12/17   Authorization Type no g codes   PT Start Time 0758   PT Stop Time 0844   PT Time Calculation (min) 46 min   Equipment Utilized During Treatment Gait belt   Activity Tolerance Patient tolerated treatment well   Behavior During Therapy [facility] for tasks assessed/performed      Past Medical History:  Diagnosis Date  . Bone marrow disease   . Hypertension     History reviewed. No pertinent surgical history.  Vitals:   01/24/17 0803  BP: 136/84  SpO2: 98%        Subjective Assessment - 01/24/17 0802    Subjective Pt reports he is tired today and did not sleep well.  Reports 2-3/10 back pain with movement which is his baseline.  No new complaints or concerns.   Patient is accompained by: --   Pertinent History Patient is a 61 year old right-handed male with history of hypertension. Per chart review patient is married. 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 Presented to [facility] 07/14/2016 with left-sided weakness and facial droop. Blood pressure elevated 170/106. Head CT scan negative. 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 CTA of the head showed no emergent large vessel occlusion or severe stenosis. Carotid Dopplers with no ICA stenosis. Patient did not receive TPA. Echocardiogram with ejection fraction of 60%. Normal systolic function. Placed on aspirin and Plavix  for CVA prophylaxis. Subcutaneous Lovenox for DVT prophylaxis.  Patient transferred to [facility] on 07/16/2016. Pt discharged form [facility] on 08/03/16. Pt reports that he made progress at [facility]. No reported problems since returning home. Currently using a wide base quad cane in RUE and L AFO. Pt reports that he has made the last progress with respect to his LUE strength.    Patient Stated Goals "Get back as close as I can to normal." Pt would to be able to play golf. Pt would like to return to some kind of work.    Currently in Pain? No/denies       TREATMENT   Modalities:  NMES pulse width 380uS, Intensity 44, Rate: 50Hz , waveform: asymmetrical 8 minutes total time. 15 sec on/ 30 sec off with pad placed along peroneal musculature. Active Eversion performed throughout entirety of motion during activation of electrical signal.   Therapeutic Exercise:  BAPS board inversion and eversion L3 x2 minutes with cues to control eversion eccentrically  Seated BAPS board DF and PF L3 x2 minutes  Toe raises/rocking back onto heels in // bars but pt unable to achieve adequate DF on L in WBing  Seated L foot DF with moderate manual resistance x15 followed immediately by forward stepping over cones with emphasis on DF x41minutes  Seated LLE coordination exercise at mirror with spontaneous card callout to tap LLE 3x30 seconds  Standing marching hip flexion in // bars against RTB with fatigue toward end of exercise. 2x20 each LE.  Neuromuscular  Re-ed:  Rockerboard L and R with intermittent UE support in // bars x1 minute  SLS LLE 2x20 seconds in // bars with intermittent UE support in // bars. Improvement following cues for glute activation.                 PT Education - 01/24/17 0757    Education provided Yes   Education Details Exercise technique; strategies to activate glutes for improved balance; role of coordination exercise   Person(s) Educated Patient   Methods Explanation;Demonstration;Verbal cues    Comprehension Need further instruction;Verbalized understanding;Returned demonstration             PT Long Term Goals - 12/18/16 1648      PT LONG TERM GOAL #1   Title Pt will be independent with HEP in order to improve strength and balance in order to decrease fall risk and improve function at home and work.   Baseline 10/30/16: Mod-min A for exericise progression/ performance 12/18/16: Requires minA for technique with exercise   Time 6   Period Weeks   Status On-going     PT LONG TERM GOAL #2   Title Pt will improve BERG by at least 3 points in order to demonstrate clinically significant improvement in balance.   Baseline 08/07/16: 49/56 10/02/16: 53/56   Time 8   Period Weeks   Status Achieved     PT LONG TERM GOAL #3   Title Pt will decrease TUG to below 14 seconds in order to demonstrate decreased fall risk    Baseline 08/07/16: 21.9 seconds; 10/02/16: 12.3    Time 8   Period Weeks   Status Achieved     PT LONG TERM GOAL #4   Title Pt will decrease 5TSTS by at least 3 seconds in order to demonstrate clinically significant improvement in LE strength   Baseline 08/07/16: 18.5 seconds; 10/02/16: 14.3   Time 8   Period Weeks   Status Achieved     PT LONG TERM GOAL #5   Title Pt will increase 10MWT by at least 0.13 m/s in order to demonstrate clinically significant improvement in community ambulation   Baseline 08/07/16: 0.53 m/s 10/02/16: .97 m/s   Time 8   Period Weeks   Status Achieved     Additional Long Term Goals   Additional Long Term Goals Yes     PT LONG TERM GOAL #6   Title Pt will be able to swing a golf club safely in order to complete 9 holes of golf   Baseline 10/30/16: Unable to swing golf club safely 12/19/15: unable to swing golf club   Time 6   Period Weeks   Status On-going     PT LONG TERM GOAL #7   Title Pt will improve 6MWT by at least 13ft  to demonstrate improved aerobic capacity/endurance   Baseline 582 ft 10/02/16: 1131ft   Time 7   Period  Weeks   Status Achieved     PT LONG TERM GOAL #8   Title Patient will improve DGI by 4 points to demonstrate improvement in dynamic gait/balance and decrease fall risk   Baseline 10/02/16: 19/24; 10/30/16: 21/24 12/18/16: 23/24   Time 6   Period Weeks   Status Achieved     PT LONG TERM GOAL  #9   TITLE Patient will demonstrate ability to perform single leg stance for >10 sec bilaterally to better be able to climb ladders   Baseline L LE SLS: 3 sec   Time  8   Period Weeks   Status New     PT LONG TERM GOAL  #10   TITLE Patient will be able to tandem walk 74ft without loss of balance to better perform dynamic balance activities to perform occupational duties.   Baseline Requires stepping responses to maintain balance with tandem walking    Time 8   Period Weeks   Status New               Plan - 01/24/17 [id number]    Clinical Impression Statement Pt demonstrates difficulty performing DF in [place] but improved activation following seated DF strengthening exercise.  Pt demonstrated impaired coordination LLE with forward stepping over cones and therefore LLE coordination exercise completed today which pt tolerated well.  Pt continues to improve his understanding of his deficits and role of therapy which appears to be positively affecting his therapy.  He will benefit from continued skilled PT interventions to improve gait mechanics and LLE strength and coordination.   Rehab Potential Good   Clinical Impairments Affecting Rehab Potential Positive: motivation, prior functional level; Negative: no active dorsiflexion, increased LUE tone   PT Frequency 2x / week   PT Duration 6 weeks   PT Treatment/Interventions ADLs/Self Care Home Management;Biofeedback;Canalith Repostioning;Cryotherapy;Electrical Stimulation;Iontophoresis 4mg /ml Dexamethasone;Aquatic Therapy;Moist Heat;Traction;Ultrasound;Gait training;Stair training;Functional mobility training;Therapeutic activities;DME  Instruction;Therapeutic exercise;Balance training;Neuromuscular re-education;Cognitive remediation;Patient/family education;Orthotic Fit/Training;Manual techniques;Passive range of motion;Energy conservation;Taping;Vestibular   PT Next Visit Plan [place]; DGI, LEFS, NME for L anterior tibialis, Walk-aide, progress balance, strengthening, and HEP; Consider sidelying hip abduction exercises   PT Home Exercise Plan Sit to stand, seated tband resisted clams, seated adduction squeeze   Consulted and Agree with Plan of Care Patient      Patient will benefit from skilled therapeutic intervention in order to improve the following deficits and impairments:  Abnormal gait, Decreased balance, Decreased coordination, Decreased mobility, Decreased range of motion, Decreased strength, Difficulty walking, Impaired tone, Impaired UE functional use  Visit Diagnosis: Muscle weakness (generalized)  Other lack of coordination  Unsteadiness on feet     Problem List Patient Active Problem List   Diagnosis Date Noted  . 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 Neurocognitive disorder   . Left foot drop   . Benign essential HTN   . Hemiparesis affecting nondominant side as late effect of cerebrovascular accident (CVA) ([place]) 07/18/2016  . Right basal ganglia embolic stroke ([place]) [id number]  . CVA (cerebral infarction) 07/15/2016  . TIA (transient ischemic attack) 07/14/2016  . HTN, goal below 140/80 07/14/2016  . Left facial numbness 07/14/2016  . Weakness of left side of body 07/14/2016    Collie Siad PT, DPT 01/24/2017, 8:59 AM  [place] MAIN [facility] SERVICES [address] [place], [place], [address] Phone: [phone]   Fax:  [phone]  Name: Jason Oconnor MRN: [id number] Date of Birth: Feb 09, 1956

## 2017-01-25 NOTE — Therapy (Signed)
[place] MAIN [facility] SERVICES [address] [place], [place], [address] Phone: [phone]   Fax:  [phone]  Occupational Therapy Treatment/Recertification  Patient Details  Name: Jason Oconnor MRN: [id number] Date of Birth: [birth year] No Data Recorded  Encounter Date: 01/24/2017      OT End of Session - 01/24/17 1049    Visit Number 41   Number of Visits 67   Date for OT Re-Evaluation 01/24/17   Authorization Type 12 of 20 visits for 2018 calendar year   OT Start Time 0845   OT Stop Time 0930   OT Time Calculation (min) 45 min   Activity Tolerance Patient tolerated treatment well   Behavior During Therapy [facility] for tasks assessed/performed      Past Medical History:  Diagnosis Date  . Bone marrow disease   . Hypertension     History reviewed. No pertinent surgical history.  There were no vitals filed for this visit.      Subjective Assessment - 01/24/17 0849    Subjective  Patient reports he has done most of his paperwork for disability and has an appointment with the social security office next week to apply there.  He reports some spasms last night and didn't sleep well.     Pertinent History Patient reports on July 13, 2016 he was helping a friend doing home repairs and felt tired, went home and started to notice problems moving around.  The next morning he feel trying to put his slippers on, wife transported him to ER and [facility] for 3 days and then went to [facility] for REhab and was discharged this past Saturday to home.     Patient Stated Goals "I want to get back to close as normal as possible, play golf again, work in the garage."     Currently in Pain? Yes   Pain Score 3    Pain Location Back   Pain Orientation Lower   Pain Descriptors / Indicators Aching   Pain Type Chronic pain   Pain Onset More than a month ago   Pain Frequency Intermittent                      OT Treatments/Exercises (OP) -  01/24/17 1042      Fine Motor Coordination   Other Fine Motor Exercises Patient seen for fine motor tasks of picking up and turning objects with cues for isolated finger movements.  Manipulation of small marbles to pick up with cues for prehension patterns and move towards the palm, using hand for storage and back to the fingertips.  Patient dropped 5 of 20 this date.  Translatory movements of the hand with marbles in taped chain with emphasis on thumb movements.      Neurological Re-education Exercises   Other Exercises 1 Patient seen for reaching tasks to place small objects at the top of overhead shelf for 20 reps total.  Ball on the wall activities for moving ball up and down the wall with hand over hand motions with R and L pushing overhead and back down to waist level. Resistive wrist flexion/extension, resistive velcro with fingers for extension, large and small rolls.     Other Exercises 2 Ball toss to wall with return for reaction time and coordination as well as strength with keeping arms overhead during entirety of task, multiple trials completed, cues for form and technique as well as positioning in standing.  OT Education - 01/24/17 1049    Education provided Yes   Education Details fine motor coordination and hand dexterity tasks.    Person(s) Educated Patient   Methods Explanation;Demonstration;Verbal cues   Comprehension Verbal cues required;Returned demonstration;Verbalized understanding             OT Long Term Goals - 01/24/17 1050      OT LONG TERM GOAL #1   Title Patient to improve left UE coordination sufficient to tie shoes with modified independence.    Baseline unable   Time 12   Period Weeks   Status Achieved     OT LONG TERM GOAL #2   Title Patient to improve LUE coordination to complete buttons, snaps and zippers on clothing with modified independence.   Time 12   Period Weeks   Status Achieved     OT LONG TERM GOAL #3    Title Patient to complete bathing with modified independence including using left arm to wash right side of body.    Time 12   Period Weeks   Status Achieved     OT LONG TERM GOAL #4   Title Patient to improve left hand use to type on computer using both hands on home row keys, 2 lines with no errors in 5 minutes.    Baseline unable   Time 12   Period Weeks   Status On-going     OT LONG TERM GOAL #5   Title Patient will improve grip on left hand by 10# to stabilize jars and containers for opening.    Baseline unable   Time 12   Period Weeks   Status On-going     OT LONG TERM GOAL #6   Title Patient will be able to demonstrate sweeping with use of bilateral UEs with modified independence.    Baseline unable   Time 12   Period Weeks   Status Partially Met     OT LONG TERM GOAL #7   Title Patient will improve grip strength in left hand to be able to stabilize utensils to cut meat with modified independence.    Baseline unable    Time 12   Period Weeks   Status Achieved     OT LONG TERM GOAL #8   Title Patient will be able to demonstrate increased strength in LUE to reach into microwave and retrieve plate of food with modified independence.   Baseline unable.    Time 12   Period Weeks   Status On-going     OT LONG TERM GOAL  #9   Baseline Patient will improve left UE control and reach to operate left turning signal in the car with modified independence.    Time 12   Period Weeks   Status Partially Met     OT LONG TERM GOAL  #10   TITLE Patient will be able to hold and manage large brush to wash his truck with modified independence.    Baseline unable   Time 12   Period Weeks   Status On-going               Plan - 01/24/17 1050    Clinical Impression Statement Patient has continued to make good progress towards goals.  He has shown improvements in LUE for reaching tasks, self care skills and coordination.  Fine motor coordination to pick up items as small as  1/4 inch in size and a variety of shaped objects, speed remains slow but has improved  over the last couple months.  He continues to work towards goals to maximize safety and independence in daily tasks and continues to benefit from skilled OT.     Rehab Potential Good   OT Frequency 2x / week   OT Duration 12 weeks   OT Treatment/Interventions Self-care/ADL training;Therapeutic exercise;Neuromuscular education;Visual/perceptual remediation/compensation;Therapist, nutritional;Therapeutic exercises;Patient/family education;DME and/or AE instruction;Manual Therapy;Therapeutic activities;Balance training   Consulted and Agree with Plan of Care Patient      Patient will benefit from skilled therapeutic intervention in order to improve the following deficits and impairments:  Decreased knowledge of use of DME, Decreased coordination, Impaired sensation, Decreased activity tolerance, Decreased range of motion, Decreased strength, Decreased balance, Difficulty walking, Impaired UE functional use  Visit Diagnosis: Muscle weakness (generalized) - Plan: Ot plan of care cert/re-cert  Other lack of coordination - Plan: Ot plan of care cert/re-cert    Problem List Patient Active Problem List   Diagnosis Date Noted  . 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 Neurocognitive disorder   . Left foot drop   . Benign essential HTN   . Hemiparesis affecting nondominant side as late effect of cerebrovascular accident (CVA) ([place]) 07/18/2016  . Right basal ganglia embolic stroke ([place]) 86[birth year]76/1950  . CVA (cerebral infarction) 07/15/2016  . TIA (transient ischemic attack) 07/14/2016  . HTN, goal below 140/80 07/14/2016  . Left facial numbness 07/14/2016  . Weakness of left side of body 07/14/2016   Vivion Romano T Tomasita Morrow, OTR/L, CLT  Jason Oconnor 01/25/2017, 1:26 PM  [place] MAIN [facility] SERVICES [address] [place], [place], [address] Phone: [phone]   Fax:   [phone]  Name: Jason Oconnor MRN: [id number] Date of Birth: July 17, 1956

## 2017-01-28 ENCOUNTER — Telehealth: Payer: Self-pay | Admitting: *Deleted

## 2017-01-28 DIAGNOSIS — IMO0002 Reserved for concepts with insufficient information to code with codable children: Secondary | ICD-10-CM

## 2017-01-28 DIAGNOSIS — R269 Unspecified abnormalities of gait and mobility: Secondary | ICD-10-CM

## 2017-01-28 NOTE — Telephone Encounter (Signed)
Order placed for PT to do FCE @ [place]

## 2017-01-31 ENCOUNTER — Ambulatory Visit: Payer: 59

## 2017-01-31 ENCOUNTER — Ambulatory Visit: Payer: 59 | Admitting: Occupational Therapy

## 2017-01-31 ENCOUNTER — Encounter: Payer: Self-pay | Admitting: Occupational Therapy

## 2017-01-31 DIAGNOSIS — M6281 Muscle weakness (generalized): Secondary | ICD-10-CM

## 2017-01-31 DIAGNOSIS — R278 Other lack of coordination: Secondary | ICD-10-CM

## 2017-01-31 DIAGNOSIS — R2681 Unsteadiness on feet: Secondary | ICD-10-CM

## 2017-01-31 NOTE — Therapy (Signed)
[place] MAIN [facility] SERVICES [address] [place], [place], [address] Phone: [phone]   Fax:  [phone]  Physical Therapy Treatment  Patient Details  Name: Jason Oconnor MRN: [id number] Date of Birth: 1956-05-27 Referring Provider: Dr. Naaman Plummer  Encounter Date: 01/31/2017      PT End of Session - 01/31/17 0855    Visit Number 40   Number of Visits 53   Date for PT Re-Evaluation 02/12/17   Authorization Type no g codes   PT Start Time 0800   PT Stop Time 0845   PT Time Calculation (min) 45 min   Equipment Utilized During Treatment Gait belt   Activity Tolerance Patient tolerated treatment well   Behavior During Therapy [facility] for tasks assessed/performed      Past Medical History:  Diagnosis Date  . Bone marrow disease   . Hypertension     History reviewed. No pertinent surgical history.  There were no vitals filed for this visit.      Subjective Assessment - 01/31/17 0815    Subjective Patient reports he gets minimal back pain when transferring from sitting to standing.    Pertinent History Patient is a 61 year old right-handed male with history of hypertension. Per chart review patient is married. 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 Presented to [facility] 07/14/2016 with left-sided weakness and facial droop. Blood pressure elevated 170/106. Head CT scan negative. 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 CTA of the head showed no emergent large vessel occlusion or severe stenosis. Carotid Dopplers with no ICA stenosis. Patient did not receive TPA. Echocardiogram with ejection fraction of 60%. Normal systolic function. Placed on aspirin and Plavix for CVA prophylaxis. Subcutaneous Lovenox for DVT prophylaxis.  Patient transferred to [facility] on 07/16/2016.  Pt discharged form [facility] on 08/03/16. Pt reports that he made progress at CIR. No reported problems since returning home. Currently using a wide base quad cane in RUE and L AFO. Pt reports that he has made the last progress with respect to his LUE strength.    Patient Stated Goals "Get back as close as I can to normal." Pt would to be able to play golf. Pt would like to return to some kind of work.    Currently in Pain? No/denies      TREATMENT    Modalities:  NMES pulse width 380uS, Intensity 44, Rate: 50Hz , waveform: asymmetrical 8 minutes total time. 15 sec on/ 30 sec off with pad placed along peroneal musculature. Active Eversion performed throughout entirety of motion during activation of electrical signal.    Therapeutic Exercise:  Tandem amb on half foam roller with intermittent UE support - down and back x5  Ankle inversion/eversion; ant/post on dynadisc -- x30 in each direction; patient performs movement slowly secondary to decreased motor control Wobbleboard without UE support lateral weight shifts - x 2 min with slow control tapping Bosu black balancing on with dribbling ball - x30 with alternating UEs to perform Bosu black balancing on bosu with throw and catch - x30 with feet placed together while balancing Stepping lunges onto Bosu ball - x20 with intermittent UE support  Hip abduction with foot placed on half foam - x20 for hip stabilization Hamstring curls in standing with 4# weight - 2 x 10 on L LE only  PT Education - 01/31/17 0854    Education provided Yes   Education Details Cueing to improve ankle motor control   Person(s) Educated Patient   Methods Explanation;Demonstration   Comprehension Verbalized understanding;Returned demonstration             PT Long Term Goals - 12/18/16 1648      PT LONG TERM GOAL #1   Title Pt will be independent with HEP in order to improve strength and balance in order to decrease fall risk and improve function at home and  work.   Baseline 10/30/16: Mod-min A for exericise progression/ performance 12/18/16: Requires minA for technique with exercise   Time 6   Period Weeks   Status On-going     PT LONG TERM GOAL #2   Title Pt will improve BERG by at least 3 points in order to demonstrate clinically significant improvement in balance.   Baseline 08/07/16: 49/56 10/02/16: 53/56   Time 8   Period Weeks   Status Achieved     PT LONG TERM GOAL #3   Title Pt will decrease TUG to below 14 seconds in order to demonstrate decreased fall risk    Baseline 08/07/16: 21.9 seconds; 10/02/16: 12.3    Time 8   Period Weeks   Status Achieved     PT LONG TERM GOAL #4   Title Pt will decrease 5TSTS by at least 3 seconds in order to demonstrate clinically significant improvement in LE strength   Baseline 08/07/16: 18.5 seconds; 10/02/16: 14.3   Time 8   Period Weeks   Status Achieved     PT LONG TERM GOAL #5   Title Pt will increase 10MWT by at least 0.13 m/s in order to demonstrate clinically significant improvement in community ambulation   Baseline 08/07/16: 0.53 m/s 10/02/16: .97 m/s   Time 8   Period Weeks   Status Achieved     Additional Long Term Goals   Additional Long Term Goals Yes     PT LONG TERM GOAL #6   Title Pt will be able to swing a golf club safely in order to complete 9 holes of golf   Baseline 10/30/16: Unable to swing golf club safely 12/19/15: unable to swing golf club   Time 6   Period Weeks   Status On-going     PT LONG TERM GOAL #7   Title Pt will improve 6MWT by at least 165ft  to demonstrate improved aerobic capacity/endurance   Baseline 582 ft 10/02/16: 116ft   Time 7   Period Weeks   Status Achieved     PT LONG TERM GOAL #8   Title Patient will improve DGI by 4 points to demonstrate improvement in dynamic gait/balance and decrease fall risk   Baseline 10/02/16: 19/24; 10/30/16: 21/24 12/18/16: 23/24   Time 6   Period Weeks   Status Achieved     PT LONG TERM GOAL  #9   TITLE Patient  will demonstrate ability to perform single leg stance for >10 sec bilaterally to better be able to climb ladders   Baseline L LE SLS: 3 sec   Time 8   Period Weeks   Status New     PT LONG TERM GOAL  #10   TITLE Patient will be able to tandem walk 78ft without loss of balance to better perform dynamic balance activities to perform occupational duties.   Baseline Requires stepping responses to maintain balance with tandem walking    Time 8  Period Weeks   Status New               Plan - 01/31/17 0855    Clinical Impression Statement Pt continues to demonstrate decreased motor control with LE and ankle movements and patient will benefit from further skilled therapy focused on improving coordinaiton and strength to return to prior level of function.     Rehab Potential Good   Clinical Impairments Affecting Rehab Potential Positive: motivation, prior functional level; Negative: no active dorsiflexion, increased LUE tone   PT Frequency 2x / week   PT Duration 6 weeks   PT Treatment/Interventions ADLs/Self Care Home Management;Biofeedback;Canalith Repostioning;Cryotherapy;Electrical Stimulation;Iontophoresis 4mg /ml Dexamethasone;Aquatic Therapy;Moist Heat;Traction;Ultrasound;Gait training;Stair training;Functional mobility training;Therapeutic activities;DME Instruction;Therapeutic exercise;Balance training;Neuromuscular re-education;Cognitive remediation;Patient/family education;Orthotic Fit/Training;Manual techniques;Passive range of motion;Energy conservation;Taping;Vestibular   PT Next Visit Plan Clamshell; DGI, LEFS, NME for L anterior tibialis, Walk-aide, progress balance, strengthening, and HEP; Consider sidelying hip abduction exercises   PT Home Exercise Plan Sit to stand, seated tband resisted clams, seated adduction squeeze   Consulted and Agree with Plan of Care Patient      Patient will benefit from skilled therapeutic intervention in order to improve the following deficits  and impairments:  Abnormal gait, Decreased balance, Decreased coordination, Decreased mobility, Decreased range of motion, Decreased strength, Difficulty walking, Impaired tone, Impaired UE functional use  Visit Diagnosis: Other lack of coordination  Muscle weakness (generalized)  Unsteadiness on feet     Problem List Patient Active Problem List   Diagnosis Date Noted  . 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 Neurocognitive disorder   . Left foot drop   . Benign essential HTN   . Hemiparesis affecting nondominant side as late effect of cerebrovascular accident (CVA) ([place]) 07/18/2016  . Right basal ganglia embolic stroke ([place]) 63/81/7711  . CVA (cerebral infarction) 07/15/2016  . TIA (transient ischemic attack) 07/14/2016  . HTN, goal below 140/80 07/14/2016  . Left facial numbness 07/14/2016  . Weakness of left side of body 07/14/2016    Blythe Stanford, PT DPT 01/31/2017, 9:09 AM  [place] MAIN [facility] SERVICES [address] [place], [place], [address] Phone: [phone]   Fax:  [phone]  Name: Jason Oconnor MRN: [id number] Date of Birth: 04-06-1956

## 2017-02-01 NOTE — Therapy (Signed)
[place] MAIN [facility] SERVICES [address] [place], [place], [address] Phone: [phone]   Fax:  [phone]  Occupational Therapy Treatment  Patient Details  Name: Jason Oconnor MRN: [id number] Date of Birth: Aug 12, 1956 No Data Recorded  Encounter Date: 01/31/2017      OT End of Session - 01/31/17 0906    Visit Number 42   Number of Visits 84   Date for OT Re-Evaluation 04/18/17   Authorization Type 13 of 20 visits for 2018 calendar year   OT Start Time 0845   OT Stop Time 0931   OT Time Calculation (min) 46 min      Past Medical History:  Diagnosis Date  . Bone marrow disease   . Hypertension     History reviewed. No pertinent surgical history.  There were no vitals filed for this visit.      Subjective Assessment - 01/31/17 0903    Subjective  Patient wanting to ask doctor if it will be ok to travel this summer and ride in the car for several hours at a time. Reports he feels he thinks he has gotten most of the information done for his disability.  Needs to go to [employer] and get some water but has difficulty with picking up the case of 40 bottles.    Pertinent History Patient reports on July 13, 2016 he was helping a friend doing home repairs and felt tired, went home and started to notice problems moving around.  The next morning he feel trying to put his slippers on, wife transported him to ER and [facility] for 3 days and then went to [facility] for REhab and was discharged this past Saturday to home.     Patient Stated Goals "I want to get back to close as normal as possible, play golf again, work in the garage."     Currently in Pain? Yes   Pain Score 1    Pain Location Shoulder   Pain Orientation Left   Pain Descriptors / Indicators Aching   Pain Type Chronic pain   Pain Onset More than a month ago   Pain Frequency Intermittent                      OT Treatments/Exercises (OP) - 02/01/17 1405      Fine Motor  Coordination   Other Fine Motor Exercises Manipulation of pieces of [place] bimanual work sample with left hand leading the task.  Cues provided for prehension patterns.  Assembly and then removal.      Neurological Re-education Exercises   Other Exercises 1 Patient seen for UE ranger in standing for shoulder flexion, ABD/ADD, circular motions forwards and backwards, diagonal patterns for 15 reps each at 2 levels, shoulder height and then above shoulder height.     Other Exercises 2 Ball toss with saebo balls in standing and tossing into basket with cues for form and technique with throwing motion.  Completed 3 rounds, SBA to retrieve the balls from floor level.  Completed another 2 rounds after rest break with 70% accuracy.                       OT Long Term Goals - 01/31/17 0908      OT LONG TERM GOAL #1   Title Patient to improve left UE coordination sufficient to tie shoes with modified independence.    Baseline unable   Time 12   Period  Weeks   Status Achieved     OT LONG TERM GOAL #2   Title Patient to improve LUE coordination to complete buttons, snaps and zippers on clothing with modified independence.   Time 12   Period Weeks   Status Achieved     OT LONG TERM GOAL #3   Title Patient to complete bathing with modified independence including using left arm to wash right side of body.    Time 12   Period Weeks   Status Achieved     OT LONG TERM GOAL #4   Title Patient to improve left hand use to type on computer using both hands on home row keys, 2 lines with no errors in 5 minutes.    Baseline unable   Time 12   Period Weeks   Status On-going     OT LONG TERM GOAL #5   Title Patient will improve grip on left hand by 10# to stabilize jars and containers for opening.    Baseline unable   Time 12   Period Weeks   Status On-going     OT LONG TERM GOAL #6   Title Patient will be able to demonstrate sweeping with use of bilateral UEs with modified  independence.    Baseline unable   Time 12   Period Weeks   Status Partially Met     OT LONG TERM GOAL #7   Title Patient will improve grip strength in left hand to be able to stabilize utensils to cut meat with modified independence.    Baseline unable    Time 12   Period Weeks   Status Achieved     OT LONG TERM GOAL #8   Title Patient will be able to demonstrate increased strength in LUE to reach into microwave and retrieve plate of food with modified independence.   Baseline unable.    Time 12   Period Weeks   Status On-going     OT LONG TERM GOAL  #9   Baseline Patient will improve left UE control and reach to operate left turning signal in the car with modified independence.    Time 12   Period Weeks   Status Partially Met     OT LONG TERM GOAL  #10   TITLE Patient will be able to hold and manage large brush to wash his truck with modified independence.    Baseline unable   Time 12   Period Weeks   Status On-going               Plan - 01/31/17 0907    Clinical Impression Statement Patient continues to demo incoordination of gross movements when throwing a ball but has improved his accuracy of hitting targets to 70%.  He continues to work towards developing increased speed and dexterity to manipulate small objects.  Continue to work towards goals to improve independence and performance in daily tasks.    Rehab Potential Good   OT Frequency 2x / week   OT Duration 12 weeks   OT Treatment/Interventions Self-care/ADL training;Therapeutic exercise;Neuromuscular education;Visual/perceptual remediation/compensation;Therapist, nutritional;Therapeutic exercises;Patient/family education;DME and/or AE instruction;Manual Therapy;Therapeutic activities;Balance training   Consulted and Agree with Plan of Care Patient      Patient will benefit from skilled therapeutic intervention in order to improve the following deficits and impairments:  Decreased knowledge of use  of DME, Decreased coordination, Impaired sensation, Decreased activity tolerance, Decreased range of motion, Decreased strength, Decreased balance, Difficulty walking, Impaired UE functional use  Visit Diagnosis: Muscle weakness (generalized)  Other lack of coordination    Problem List Patient Active Problem List   Diagnosis Date Noted  . 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 Neurocognitive disorder   . Left foot drop   . Benign essential HTN   . Hemiparesis affecting nondominant side as late effect of cerebrovascular accident (CVA) ([place]) 07/18/2016  . Right basal ganglia embolic stroke ([place]) 01/58/6825  . CVA (cerebral infarction) 07/15/2016  . TIA (transient ischemic attack) 07/14/2016  . HTN, goal below 140/80 07/14/2016  . Left facial numbness 07/14/2016  . Weakness of left side of body 07/14/2016   Charles Niese T Tomasita Morrow, OTR/L, CLT  Ashwika Freels 02/01/2017, 2:13 PM  [place] MAIN [facility] SERVICES [address] [place], [place], [address] Phone: [phone]   Fax:  [phone]  Name: Jason Oconnor MRN: [id number] Date of Birth: Sep 17, 1956

## 2017-02-07 ENCOUNTER — Ambulatory Visit: Payer: 59 | Admitting: Occupational Therapy

## 2017-02-07 ENCOUNTER — Ambulatory Visit: Payer: 59

## 2017-02-07 DIAGNOSIS — R2681 Unsteadiness on feet: Secondary | ICD-10-CM

## 2017-02-07 DIAGNOSIS — M6281 Muscle weakness (generalized): Secondary | ICD-10-CM

## 2017-02-07 DIAGNOSIS — R278 Other lack of coordination: Secondary | ICD-10-CM

## 2017-02-07 NOTE — Therapy (Signed)
[place] MAIN [facility] SERVICES [address] [place], [place], [address] Phone: [phone]   Fax:  [phone]  Occupational Therapy Treatment  Patient Details  Name: Jason Oconnor MRN: [id number] Date of Birth: November 17, 1956 No Data Recorded  Encounter Date: 02/07/2017      OT End of Session - 02/07/17 0905    Visit Number 43   Number of Visits 48   Date for OT Re-Evaluation 04/18/17   Authorization Type 14 of 20 visits for 2018 calendar year   OT Start Time 0847   OT Stop Time 0930   OT Time Calculation (min) 43 min   Activity Tolerance Patient tolerated treatment well   Behavior During Therapy [facility] for tasks assessed/performed      Past Medical History:  Diagnosis Date  . Bone marrow disease   . Hypertension     No past surgical history on file.  There were no vitals filed for this visit.      Subjective Assessment - 02/07/17 0901    Subjective  Pt. reports he is tapering off the Baclefen. Pt. reports he feels like it didin't help him, and he didn't notice any changes    Patient is accompained by: Family member   Pertinent History Patient reports on July 13, 2016 he was helping a friend doing home repairs and felt tired, went home and started to notice problems moving around.  The next morning he feel trying to put his slippers on, wife transported him to ER and [facility] days and then went to [facility] for REhab and was discharged this past Saturday to home.     Patient Stated Goals "I want to get back to close as normal as possible, play golf again, work in the garage."     Currently in Pain? Yes   Pain Score 4    Pain Location Back   Pain Orientation Left;Mid   Pain Type Acute pain                      OT Treatments/Exercises (OP) - 02/07/17 0930      Fine Motor Coordination   Other Fine Motor Exercises Pt. Worked on LUE reaching using the shape tower with emphasis placed on isolating shoulder movement  without compensating with his trunk. Pt. Worked on translatory movements of the left hand moving a 1.5" soft circular sphere up and down palm with with his digits without thumb. Pt. Worked on moving a sphere with reverse tape in and out of palm with the thumb. Pt. Had difficulty with thumb movements. Pt. Worked on taping thumb to 5th digit for thumb opposition. Pt. Worked on grasping 1/2 inch small pegs with his 2nd digit, and thumb with cues for compensation proximally with the trunk.     Neurological Re-education Exercises   Other Exercises 1 Pt. Worked with the UE Ranger with his LUE. Pt. Worked on flexion, abduction, and circular motions.                 OT Education - 02/07/17 0904    Education provided Yes   Education Details LUE functioning   Person(s) Educated Patient   Methods Explanation;Demonstration   Comprehension Verbalized understanding;Returned demonstration             OT Long Term Goals - 01/31/17 0908      OT LONG TERM GOAL #1   Title Patient to improve left UE coordination sufficient to tie shoes with  modified independence.    Baseline unable   Time 12   Period Weeks   Status Achieved     OT LONG TERM GOAL #2   Title Patient to improve LUE coordination to complete buttons, snaps and zippers on clothing with modified independence.   Time 12   Period Weeks   Status Achieved     OT LONG TERM GOAL #3   Title Patient to complete bathing with modified independence including using left arm to wash right side of body.    Time 12   Period Weeks   Status Achieved     OT LONG TERM GOAL #4   Title Patient to improve left hand use to type on computer using both hands on home row keys, 2 lines with no errors in 5 minutes.    Baseline unable   Time 12   Period Weeks   Status On-going     OT LONG TERM GOAL #5   Title Patient will improve grip on left hand by 10# to stabilize jars and containers for opening.    Baseline unable   Time 12   Period Weeks    Status On-going     OT LONG TERM GOAL #6   Title Patient will be able to demonstrate sweeping with use of bilateral UEs with modified independence.    Baseline unable   Time 12   Period Weeks   Status Partially Met     OT LONG TERM GOAL #7   Title Patient will improve grip strength in left hand to be able to stabilize utensils to cut meat with modified independence.    Baseline unable    Time 12   Period Weeks   Status Achieved     OT LONG TERM GOAL #8   Title Patient will be able to demonstrate increased strength in LUE to reach into microwave and retrieve plate of food with modified independence.   Baseline unable.    Time 12   Period Weeks   Status On-going     OT LONG TERM GOAL  #9   Baseline Patient will improve left UE control and reach to operate left turning signal in the car with modified independence.    Time 12   Period Weeks   Status Partially Met     OT LONG TERM GOAL  #10   TITLE Patient will be able to hold and manage large brush to wash his truck with modified independence.    Baseline unable   Time 12   Period Weeks   Status On-going               Plan - 02/07/17 0905    Clinical Impression Statement Pt. reports he is doing more tasks at home, and around the house to engage his left hand. Pt. is usng his Left hand to turn, and open the blinds. pt. contiues to work on improving LUE reach, as well as [place], speed, dexterity, and manipulation of small objects.      Patient will benefit from skilled therapeutic intervention in order to improve the following deficits and impairments:     Visit Diagnosis: Muscle weakness (generalized)  Other lack of coordination    Problem List Patient Active Problem List   Diagnosis Date Noted  . 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 Neurocognitive disorder   . Left foot drop   . Benign essential HTN   . Hemiparesis affecting nondominant side as late effect of cerebrovascular accident (CVA) ([place])  07/18/2016  . Right basal ganglia embolic stroke ([place]) 19/62/2297  . CVA (cerebral infarction) 07/15/2016  . TIA (transient ischemic attack) 07/14/2016  . HTN, goal below 140/80 07/14/2016  . Left facial numbness 07/14/2016  . Weakness of left side of body 07/14/2016    Harrel Carina, MS, OTR/L 02/07/2017, 9:36 AM  [place] MAIN [facility] SERVICES [address] [place], [place], [address] Phone: [phone]   Fax:  [phone]  Name: Jaramie Bastos MRN: [id number] Date of Birth: 1956-01-15

## 2017-02-07 NOTE — Therapy (Signed)
[place] MAIN [facility] SERVICES [address] [place], [place], [address] Phone: [phone]   Fax:  [phone]  Physical Therapy Treatment  Patient Details  Name: Jason Oconnor MRN: [id number] Date of Birth: [birth year] Referring Provider: Dr. Naaman Plummer  Encounter Date: 02/07/2017      PT End of Session - 02/07/17 0835    Visit Number 41   Number of Visits 53   Date for PT Re-Evaluation 02/12/17   Authorization Type no g codes   PT Start Time 0800   PT Stop Time 0845   PT Time Calculation (min) 45 min   Equipment Utilized During Treatment Gait belt   Activity Tolerance Patient tolerated treatment well   Behavior During Therapy [facility] for tasks assessed/performed      Past Medical History:  Diagnosis Date  . Bone marrow disease   . Hypertension     History reviewed. No pertinent surgical history.  There were no vitals filed for this visit.      Subjective Assessment - 02/07/17 0808    Subjective Patient reports he gets cramps and increased knee pain along his L knee.    Pertinent History Patient is a 61 year old right-handed male with history of hypertension. Per chart review patient is married. 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 Presented to [facility] 07/14/2016 with left-sided weakness and facial droop. Blood pressure elevated 170/106. Head CT scan negative. 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 CTA of the head showed no emergent large vessel occlusion or severe stenosis. Carotid Dopplers with no ICA stenosis. Patient did not receive TPA. Echocardiogram with ejection fraction of 60%. Normal systolic function. Placed on aspirin and Plavix for CVA prophylaxis. Subcutaneous Lovenox for DVT prophylaxis.  Patient transferred to [facility] on 07/16/2016. Pt  discharged form CIR on 08/03/16. Pt reports that he made progress at CIR. No reported problems since returning home. Currently using a wide base quad cane in RUE and L AFO. Pt reports that he has made the last progress with respect to his LUE strength.    Patient Stated Goals "Get back as close as I can to normal." Pt would to be able to play golf. Pt would like to return to some kind of work.    Currently in Pain? Yes   Pain Score 3    Pain Location Knee   Pain Orientation Left   Pain Descriptors / Indicators Sharp   Pain Type Acute pain   Pain Frequency Intermittent      TREATMENT    Modalities:  NMES pulse width 380uS, Intensity 44, Rate: 50Hz , waveform: asymmetrical 8 minutes total time. 15 sec on/ 15 sec off with pad placed along peroneal musculature. Active Eversion performed throughout entirety of motion during activation of electrical signal.    Therapeutic Exercise:  Tandem amb on half foam roller with intermittent UE support - down and back x5  Resisted Toe extension B with mirror for feedback - 3 x 10 Standing hamstring/ toe extension stretch - 2 x 30sec  Seated LAQ for warmup before standing - x 10  With ankles in dorsiflexion on airex pad - balloon taps x20 each UE  Resisted/assisted at end range of motion ankle eversion with mirror - x 10      PT Education - 02/07/17 0827    Education provided Yes   Education  Details Ankle motion and control with exercises   Person(s) Educated Patient   Methods Demonstration;Explanation   Comprehension Verbalized understanding;Returned demonstration             PT Long Term Goals - 12/18/16 1648      PT LONG TERM GOAL #1   Title Pt will be independent with HEP in order to improve strength and balance in order to decrease fall risk and improve function at home and work.   Baseline 10/30/16: Mod-min A for exericise progression/ performance 12/18/16: Requires minA for technique with exercise   Time 6   Period Weeks   Status  On-going     PT LONG TERM GOAL #2   Title Pt will improve BERG by at least 3 points in order to demonstrate clinically significant improvement in balance.   Baseline 08/07/16: 49/56 10/02/16: 53/56   Time 8   Period Weeks   Status Achieved     PT LONG TERM GOAL #3   Title Pt will decrease TUG to below 14 seconds in order to demonstrate decreased fall risk    Baseline 08/07/16: 21.9 seconds; 10/02/16: 12.3    Time 8   Period Weeks   Status Achieved     PT LONG TERM GOAL #4   Title Pt will decrease 5TSTS by at least 3 seconds in order to demonstrate clinically significant improvement in LE strength   Baseline 08/07/16: 18.5 seconds; 10/02/16: 14.3   Time 8   Period Weeks   Status Achieved     PT LONG TERM GOAL #5   Title Pt will increase 10MWT by at least 0.13 m/s in order to demonstrate clinically significant improvement in community ambulation   Baseline 08/07/16: 0.53 m/s 10/02/16: .97 m/s   Time 8   Period Weeks   Status Achieved     Additional Long Term Goals   Additional Long Term Goals Yes     PT LONG TERM GOAL #6   Title Pt will be able to swing a golf club safely in order to complete 9 holes of golf   Baseline 10/30/16: Unable to swing golf club safely 12/19/15: unable to swing golf club   Time 6   Period Weeks   Status On-going     PT LONG TERM GOAL #7   Title Pt will improve 6MWT by at least 161ft  to demonstrate improved aerobic capacity/endurance   Baseline 582 ft 10/02/16: 1128ft   Time 7   Period Weeks   Status Achieved     PT LONG TERM GOAL #8   Title Patient will improve DGI by 4 points to demonstrate improvement in dynamic gait/balance and decrease fall risk   Baseline 10/02/16: 19/24; 10/30/16: 21/24 12/18/16: 23/24   Time 6   Period Weeks   Status Achieved     PT LONG TERM GOAL  #9   TITLE Patient will demonstrate ability to perform single leg stance for >10 sec bilaterally to better be able to climb ladders   Baseline L LE SLS: 3 sec   Time 8   Period  Weeks   Status New     PT LONG TERM GOAL  #10   TITLE Patient will be able to tandem walk 7ft without loss of balance to better perform dynamic balance activities to perform occupational duties.   Baseline Requires stepping responses to maintain balance with tandem walking    Time 8   Period Weeks   Status New  Plan - 02/07/17 0912    Clinical Impression Statement Focused on improving toe extension and ankle eversion during therapy today and patient demonstrates increased ankle strategies on the L LE today versus previous visits indicating functional carryover between visits. Although patient is improving, he continues to demonstrate decreased balance and control on L LE musculature and pt will benefit from further skilled therapy to return to prior level of function.    Rehab Potential Good   Clinical Impairments Affecting Rehab Potential Positive: motivation, prior functional level; Negative: no active dorsiflexion, increased LUE tone   PT Frequency 2x / week   PT Duration 6 weeks   PT Treatment/Interventions ADLs/Self Care Home Management;Biofeedback;Canalith Repostioning;Cryotherapy;Electrical Stimulation;Iontophoresis 4mg /ml Dexamethasone;Aquatic Therapy;Moist Heat;Traction;Ultrasound;Gait training;Stair training;Functional mobility training;Therapeutic activities;DME Instruction;Therapeutic exercise;Balance training;Neuromuscular re-education;Cognitive remediation;Patient/family education;Orthotic Fit/Training;Manual techniques;Passive range of motion;Energy conservation;Taping;Vestibular   PT Next Visit Plan Clamshell; DGI, LEFS, NME for L anterior tibialis, Walk-aide, progress balance, strengthening, and HEP; Consider sidelying hip abduction exercises   PT Home Exercise Plan Sit to stand, seated tband resisted clams, seated adduction squeeze   Consulted and Agree with Plan of Care Patient      Patient will benefit from skilled therapeutic intervention in order to  improve the following deficits and impairments:  Abnormal gait, Decreased balance, Decreased coordination, Decreased mobility, Decreased range of motion, Decreased strength, Difficulty walking, Impaired tone, Impaired UE functional use  Visit Diagnosis: Muscle weakness (generalized)  Unsteadiness on feet  Other lack of coordination     Problem List Patient Active Problem List   Diagnosis Date Noted  . 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 Neurocognitive disorder   . Left foot drop   . Benign essential HTN   . Hemiparesis affecting nondominant side as late effect of cerebrovascular accident (CVA) ([place]) 07/18/2016  . Right basal ganglia embolic stroke ([place]) 33/82/5053  . CVA (cerebral infarction) 07/15/2016  . TIA (transient ischemic attack) 07/14/2016  . HTN, goal below 140/80 07/14/2016  . Left facial numbness 07/14/2016  . Weakness of left side of body 07/14/2016    Blythe Stanford, PT DPT 02/07/2017, 9:15 AM  [place] MAIN [facility] SERVICES [address] [place], [place], [address] Phone: [phone]   Fax:  [phone]  Name: Deunte Bledsoe MRN: [id number] Date of Birth: 1956-01-08

## 2017-02-07 NOTE — Patient Instructions (Signed)
OT TREATMENT     Neuro muscular re-education:  Pt. Worked on LUE reaching using the shape tower with emphasis placed on isolating shoulder movement without compensating with his trunk. Pt. Worked on translatory movements of the left hand moving a 1.5" soft circular sphere up and down palm with with his digits without thumb. Pt. Worked on moving a sphere with reverse tape in and out of palm with the thumb. Pt. Had difficulty with thumb movements. Pt. Worked on taping thumb to 5th digit for thumb opposition. Pt. Worked on grasping 1/2 inch small pegs with his 2nd digit, and thumb with cues for compensation proximally with the trunk.  Therapeutic Exercise:  Pt. Worked with the UE Ranger with his LUE. Pt. Worked on flexion, abduction, and circular motions.   Selfcare:  Manual Therapy:

## 2017-02-13 ENCOUNTER — Ambulatory Visit: Payer: 59 | Admitting: Occupational Therapy

## 2017-02-13 ENCOUNTER — Ambulatory Visit: Payer: 59

## 2017-02-21 ENCOUNTER — Encounter: Payer: Self-pay | Admitting: Occupational Therapy

## 2017-02-21 ENCOUNTER — Ambulatory Visit: Payer: 59

## 2017-02-21 ENCOUNTER — Ambulatory Visit: Payer: 59 | Admitting: Occupational Therapy

## 2017-02-21 DIAGNOSIS — M6281 Muscle weakness (generalized): Secondary | ICD-10-CM

## 2017-02-21 DIAGNOSIS — R278 Other lack of coordination: Secondary | ICD-10-CM

## 2017-02-21 DIAGNOSIS — R2681 Unsteadiness on feet: Secondary | ICD-10-CM

## 2017-02-21 NOTE — Therapy (Signed)
[place] MAIN [facility] SERVICES [address] [place], [place], [address] Phone: [phone]   Fax:  [phone]  Physical Therapy Treatment  Patient Details  Name: Rigoberto Repass MRN: [id number] Date of Birth: 24-Jul-1956 Referring Provider: Dr. Naaman Plummer  Encounter Date: 02/21/2017      PT End of Session - 02/21/17 0859    Visit Number 41   Number of Visits 59   Date for PT Re-Evaluation 04/04/17   Authorization Type no g codes   PT Start Time 0800   PT Stop Time 0845   PT Time Calculation (min) 45 min   Equipment Utilized During Treatment Gait belt   Activity Tolerance Patient tolerated treatment well   Behavior During Therapy [facility] for tasks assessed/performed      Past Medical History:  Diagnosis Date  . Bone marrow disease   . Hypertension     History reviewed. No pertinent surgical history.  There were no vitals filed for this visit.      Subjective Assessment - 02/21/17 0857    Subjective Patient reports he gets cramps in his LE where he is able to control the ankle movements he has difficulty with performing. Patient reports he's been performing HEP and trying to activation his ankle eversion and toe extension muscles.    Pertinent History Patient is a 61 year old right-handed male with history of hypertension. Per chart review patient is married. 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 Presented to [facility] 07/14/2016 with left-sided weakness and facial droop. Blood pressure elevated 170/106. Head CT scan negative. 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 CTA of the head showed no emergent large vessel occlusion or severe stenosis. Carotid Dopplers with no ICA stenosis. Patient did not receive TPA. Echocardiogram with ejection fraction of 60%. Normal  systolic function. Placed on aspirin and Plavix for CVA prophylaxis. Subcutaneous Lovenox for DVT prophylaxis.  Patient transferred to CIR on 07/16/2016. Pt discharged form CIR on 08/03/16. Pt reports that he made progress at CIR. No reported problems since returning home. Currently using a wide base quad cane in RUE and L AFO. Pt reports that he has made the last progress with respect to his LUE strength.    Patient Stated Goals "Get back as close as I can to normal." Pt would to be able to play golf. Pt would like to return to some kind of work.    Currently in Pain? Yes   Pain Score 4    Pain Location Back   Pain Orientation Mid;Left   Pain Descriptors / Indicators Sharp   Pain Type Acute pain      TREATMENT   Observation: MMT: Ankle Eversion, toe extension: 1+/5; dorsiflexion: 2+/5   Modalities:  NMES pulse width 380uS, Intensity 44, Rate: 50Hz , waveform: asymmetrical 8 minutes total time. 15 sec on/ 15 sec off with pad placed along peroneal musculature and extensor digitorum longus. Active Eversion performed throughout entirety of motion during activation of electrical signal.    Therapeutic Exercise:  Tandem amb on half foam roller with intermittent UE support - down and back x2 Resisted Toe extension B with mirror for feedback - 3 x 10 Resisted/assisted at end range of motion ankle eversion - 2 x 10 Bosu Ball rocking ant/post in tandem stance - 2 x 20  Standing hamstring/ toe extension stretch -  x  30sec  Manually stretching of toe extension - 2 x 20sec With ankles in dorsiflexion on airex pad - balloon taps x20       PT Education - 02/21/17 0859    Education provided Yes   Education Details Review HEP   Person(s) Educated Patient   Methods Explanation   Comprehension Verbalized understanding             PT Long Term Goals - 02/21/17 0848      PT LONG TERM GOAL #1   Title Pt will be independent with HEP in order to improve strength and balance in order to decrease  fall risk and improve function at home and work.   Baseline 10/30/16: Mod-min A for exericise progression/ performance 12/18/16: Requires minA for technique with exercise; 02/21/17: modA-maxA and cueing for muscle activation   Time 6   Period Weeks   Status On-going     PT LONG TERM GOAL #2   Title Pt will improve BERG by at least 3 points in order to demonstrate clinically significant improvement in balance.   Baseline 08/07/16: 49/56 10/02/16: 53/56   Time 8   Period Weeks   Status Achieved     PT LONG TERM GOAL #3   Title Pt will decrease TUG to below 14 seconds in order to demonstrate decreased fall risk    Baseline 08/07/16: 21.9 seconds; 10/02/16: 12.3    Time 8   Period Weeks   Status Achieved     PT LONG TERM GOAL #4   Title Pt will decrease 5TSTS by at least 3 seconds in order to demonstrate clinically significant improvement in LE strength   Baseline 08/07/16: 18.5 seconds; 10/02/16: 14.3   Time 8   Period Weeks   Status Achieved     PT LONG TERM GOAL #5   Title Pt will increase 10MWT by at least 0.13 m/s in order to demonstrate clinically significant improvement in community ambulation   Baseline 08/07/16: 0.53 m/s 10/02/16: .97 m/s   Time 8   Period Weeks   Status Achieved     Additional Long Term Goals   Additional Long Term Goals Yes     PT LONG TERM GOAL #6   Title Pt will be able to swing a golf club safely in order to complete 9 holes of golf   Baseline 10/30/16: Unable to swing golf club safely 12/18/16: unable to swing golf club; 02/21/17: unable to swing golf club   Time 6   Period Weeks   Status On-going     PT LONG TERM GOAL #7   Title Pt will improve 6MWT by at least 128ft  to demonstrate improved aerobic capacity/endurance   Baseline 582 ft 10/02/16: 1148ft   Time 7   Period Weeks   Status Achieved     PT LONG TERM GOAL #8   Title Patient will improve DGI by 4 points to demonstrate improvement in dynamic gait/balance and decrease fall risk   Baseline  10/02/16: 19/24; 10/30/16: 21/24 12/18/16: 23/24   Time 6   Period Weeks   Status Achieved     PT LONG TERM GOAL  #9   TITLE Patient will demonstrate ability to perform single leg stance for >10 sec bilaterally to better be able to climb ladders   Baseline L LE SLS: 3 sec; L LE SLS: 4sec   Time 8   Period Weeks   Status On-going     PT LONG TERM GOAL  #10   TITLE  Patient will be able to tandem walk 70ft without loss of balance to better perform dynamic balance activities to perform occupational duties.   Baseline Requires stepping responses to maintain balance with tandem walking; 02/21/17: Requires 4 step downs to maintain balance   Time 8   Period Weeks   Status On-going     PT LONG TERM GOAL  #11   TITLE Pt will demonstrate 3/5 MMT strength in ankle eversion, toe ext and dorsiflexion to allow for the performance of ankle strategies to maintain balance   Baseline 1+/5: toe ext; dorsiflexion 2+/5; ankle eversion: 1+/5   Time 6   Period Weeks   Status New               Plan - 02/21/17 0859    Clinical Impression Statement Conitnued to focus on improving ankle eversion, toe extension and dorsiflexion today as patient demonstrates difficulty with balancing most notably in SLS and ambulating long distances. Patient demonstrates minimal improvement in SLS and ambulating in tandem stance. Patient demonstrates decreased muscular activation in L ankle eversion, toe extension, and dorsiflexion and will benefit from further skilled therapy focused on improving the activation and strength of these muscles to return to prior level of function and improve peformance of ADLs   Rehab Potential Good   Clinical Impairments Affecting Rehab Potential Positive: motivation, prior functional level; Negative: no active dorsiflexion, increased LUE tone   PT Frequency 2x / week   PT Duration 6 weeks   PT Treatment/Interventions ADLs/Self Care Home Management;Biofeedback;Canalith  Repostioning;Cryotherapy;Electrical Stimulation;Iontophoresis 4mg /ml Dexamethasone;Aquatic Therapy;Moist Heat;Traction;Ultrasound;Gait training;Stair training;Functional mobility training;Therapeutic activities;DME Instruction;Therapeutic exercise;Balance training;Neuromuscular re-education;Cognitive remediation;Patient/family education;Orthotic Fit/Training;Manual techniques;Passive range of motion;Energy conservation;Taping;Vestibular   PT Next Visit Plan Clamshell; DGI, LEFS, NME for L anterior tibialis, Walk-aide, progress balance, strengthening, and HEP; Consider sidelying hip abduction exercises   PT Home Exercise Plan Sit to stand, seated tband resisted clams, seated adduction squeeze   Consulted and Agree with Plan of Care Patient      Patient will benefit from skilled therapeutic intervention in order to improve the following deficits and impairments:  Abnormal gait, Decreased balance, Decreased coordination, Decreased mobility, Decreased range of motion, Decreased strength, Difficulty walking, Impaired tone, Impaired UE functional use  Visit Diagnosis: Muscle weakness (generalized)  Other lack of coordination  Unsteadiness on feet     Problem List Patient Active Problem List   Diagnosis Date Noted  . 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 Neurocognitive disorder   . Left foot drop   . Benign essential HTN   . Hemiparesis affecting nondominant side as late effect of cerebrovascular accident (CVA) ([place]) 07/18/2016  . Right basal ganglia embolic stroke ([place]) 22/63/3354  . CVA (cerebral infarction) 07/15/2016  . TIA (transient ischemic attack) 07/14/2016  . HTN, goal below 140/80 07/14/2016  . Left facial numbness 07/14/2016  . Weakness of left side of body 07/14/2016    Blythe Stanford, PT DPT 02/21/2017, 9:09 AM  [place] MAIN [facility] SERVICES [address] [place], [place], [address] Phone: [phone]   Fax:   [phone]  Name: Woodley Petzold MRN: [id number] Date of Birth: 02-Mar-1956

## 2017-02-21 NOTE — Therapy (Signed)
[place] MAIN [facility] SERVICES [address] [place], [place], [address] Phone: [phone]   Fax:  [phone]  Occupational Therapy Treatment  Patient Details  Name: Jason Oconnor MRN: [id number] Date of Birth: Jan 26, 1956 No Data Recorded  Encounter Date: 02/21/2017      OT End of Session - 02/21/17 1511    Visit Number 44   Number of Visits 15   Date for OT Re-Evaluation 04/18/17   Authorization Type 15 of 20 visits for 2018 calendar year   OT Start Time 0845   OT Stop Time 0930   OT Time Calculation (min) 45 min   Activity Tolerance Patient tolerated treatment well   Behavior During Therapy [facility] for tasks assessed/performed      Past Medical History:  Diagnosis Date  . Bone marrow disease   . Hypertension     History reviewed. No pertinent surgical history.  There were no vitals filed for this visit.      Subjective Assessment - 02/21/17 1502    Subjective  Patient reports he is trying to do as much as possible at home, has been trying to see if he could do some woodworking with making a shelf and painting, has been using his right hand so much that he thinks has developed a tendonitis.  He reports trying to incorporate his left hand into as many tasks as possible.     Patient Stated Goals "I want to get back to close as normal as possible, play golf again, work in the garage."     Currently in Pain? Yes   Pain Score 2    Pain Location Shoulder   Pain Orientation Left   Pain Descriptors / Indicators Aching   Pain Type Acute pain   Pain Onset More than a month ago   Pain Frequency Intermittent                      OT Treatments/Exercises (OP) - 02/21/17 1536      Fine Motor Coordination   Other Fine Motor Exercises Patient seen for fine motor coordination tasks with manipulation of smaller pieces of [place] with dowels, washers and collars placed into magnetic bowl to increase difficulty of picking up pieces  and manipulation with left hand. Cues for prehension patterns, dropped 4 items this date.  Difficulty noted with obtaining collars out of bowl and required switching to smaller cupped surface.      Neurological Re-education Exercises   Other Exercises 1 Patient seen for LUE shoulder ROM with paint roller up and down the wall for 3 sets of 15 reps each, grip strength with left hand on 17# setting for 25 reps.                 OT Education - 02/21/17 1505    Education provided Yes   Education Details signs of overuse of right UE, HEP   Person(s) Educated Patient   Methods Explanation;Demonstration;Verbal cues   Comprehension Verbal cues required;Returned demonstration;Verbalized understanding             OT Long Term Goals - 01/31/17 0908      OT LONG TERM GOAL #1   Title Patient to improve left UE coordination sufficient to tie shoes with modified independence.    Baseline unable   Time 12   Period Weeks   Status Achieved     OT LONG TERM GOAL #2   Title Patient to improve LUE coordination to  complete buttons, snaps and zippers on clothing with modified independence.   Time 12   Period Weeks   Status Achieved     OT LONG TERM GOAL #3   Title Patient to complete bathing with modified independence including using left arm to wash right side of body.    Time 12   Period Weeks   Status Achieved     OT LONG TERM GOAL #4   Title Patient to improve left hand use to type on computer using both hands on home row keys, 2 lines with no errors in 5 minutes.    Baseline unable   Time 12   Period Weeks   Status On-going     OT LONG TERM GOAL #5   Title Patient will improve grip on left hand by 10# to stabilize jars and containers for opening.    Baseline unable   Time 12   Period Weeks   Status On-going     OT LONG TERM GOAL #6   Title Patient will be able to demonstrate sweeping with use of bilateral UEs with modified independence.    Baseline unable   Time 12    Period Weeks   Status Partially Met     OT LONG TERM GOAL #7   Title Patient will improve grip strength in left hand to be able to stabilize utensils to cut meat with modified independence.    Baseline unable    Time 12   Period Weeks   Status Achieved     OT LONG TERM GOAL #8   Title Patient will be able to demonstrate increased strength in LUE to reach into microwave and retrieve plate of food with modified independence.   Baseline unable.    Time 12   Period Weeks   Status On-going     OT LONG TERM GOAL  #9   Baseline Patient will improve left UE control and reach to operate left turning signal in the car with modified independence.    Time 12   Period Weeks   Status Partially Met     OT LONG TERM GOAL  #10   TITLE Patient will be able to hold and manage large brush to wash his truck with modified independence.    Baseline unable   Time 12   Period Weeks   Status On-going               Plan - 02/21/17 1511    Clinical Impression Statement Difficulty with manipulation of small collars from magnetic bowl this date, improving with manipulation of small objects and objects of different shapes and forms.  He still drops items occasionally when using his left hand.  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  Continue to work towards goals to improve LUE use in daily tasks.    Rehab Potential Good   OT Frequency 2x / week   OT Duration 12 weeks   OT Treatment/Interventions Self-care/ADL training;Therapeutic exercise;Neuromuscular education;Visual/perceptual remediation/compensation;Therapist, nutritional;Therapeutic exercises;Patient/family education;DME and/or AE instruction;Manual Therapy;Therapeutic activities;Balance training   Consulted and Agree with Plan of Care Patient      Patient will benefit from skilled therapeutic intervention in order to improve the following deficits and impairments:  Decreased knowledge of use of DME, Decreased coordination, Impaired  sensation, Decreased activity tolerance, Decreased range of motion, Decreased strength, Decreased balance, Difficulty walking, Impaired UE functional use  Visit Diagnosis: Muscle weakness (generalized)  Other lack of coordination    Problem List Patient Active Problem  List   Diagnosis Date Noted  . 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 Neurocognitive disorder   . Left foot drop   . Benign essential HTN   . Hemiparesis affecting nondominant side as late effect of cerebrovascular accident (CVA) ([place]) 07/18/2016  . Right basal ganglia embolic stroke ([place]) 99/71/8209  . CVA (cerebral infarction) 07/15/2016  . TIA (transient ischemic attack) 07/14/2016  . HTN, goal below 140/80 07/14/2016  . Left facial numbness 07/14/2016  . Weakness of left side of body 07/14/2016   [name] Oneita Jolly, OTR/L, CLT  [name],[name] 02/21/2017, 3:45 PM  [place] MAIN [facility] SERVICES [address] [place], [place], [address] Phone: [phone]   Fax:  [phone]  Name: Konrad Hoak MRN: [id number] Date of Birth: [birth year]

## 2017-03-07 ENCOUNTER — Ambulatory Visit: Payer: 59 | Attending: Physical Medicine & Rehabilitation | Admitting: Occupational Therapy

## 2017-03-07 ENCOUNTER — Ambulatory Visit: Payer: 59

## 2017-03-07 ENCOUNTER — Encounter: Payer: Self-pay | Admitting: Occupational Therapy

## 2017-03-07 DIAGNOSIS — R278 Other lack of coordination: Secondary | ICD-10-CM | POA: Diagnosis present

## 2017-03-07 DIAGNOSIS — R2681 Unsteadiness on feet: Secondary | ICD-10-CM

## 2017-03-07 DIAGNOSIS — M6281 Muscle weakness (generalized): Secondary | ICD-10-CM | POA: Diagnosis present

## 2017-03-07 NOTE — Therapy (Signed)
[place] MAIN [facility] SERVICES [address] [place], [place], [address] Phone: [phone]   Fax:  [phone]  Physical Therapy Treatment/Discharge Summary  Patient Details  Name: Jason Oconnor MRN: [id number] Date of Birth: 11-20-1956 Referring Provider: Dr. Naaman Plummer  Encounter Date: 03/07/2017   Patient has attended 69 out of 42 visits and has met or partially met all long term goals and pt is to be D/C'ed from physical therapy.       PT End of Session - 03/07/17 1029    Visit Number 42   Number of Visits 59   Date for PT Re-Evaluation 04/04/17   Authorization Type no g codes   PT Start Time 0932   PT Stop Time 1000   PT Time Calculation (min) 28 min   Equipment Utilized During Treatment Gait belt   Activity Tolerance Patient tolerated treatment well   Behavior During Therapy WFL for tasks assessed/performed      Past Medical History:  Diagnosis Date  . Bone marrow disease   . Hypertension     History reviewed. No pertinent surgical history.  There were no vitals filed for this visit.      Subjective Assessment - 03/07/17 0953    Subjective Patient reports he's been performing walking program at home and active ROM of ankles and toes with wife to strength and improve the coordination of the lower foot/ankle musculature.    Pertinent History Patient is a 61 year old right-handed male with history of hypertension. Per chart review patient is married. 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 Presented to [facility] 07/14/2016 with left-sided weakness and facial droop. Blood pressure elevated 170/106. Head CT scan negative. 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 CTA of the head showed no emergent large vessel occlusion or severe stenosis. Carotid Dopplers  with no ICA stenosis. Patient did not receive TPA. Echocardiogram with ejection fraction of 60%. Normal systolic function. Placed on aspirin and Plavix for CVA prophylaxis. Subcutaneous Lovenox for DVT prophylaxis.  Patient transferred to [facility] on 07/16/2016. Pt discharged form CIR on 08/03/16. Pt reports that he made progress at CIR. No reported problems since returning home. Currently using a wide base quad cane in RUE and L AFO. Pt reports that he has made the last progress with respect to his LUE strength.    Patient Stated Goals "Get back as close as I can to normal." Pt would to be able to play golf. Pt would like to return to some kind of work.    Currently in Pain? No/denies         TREATMENT Tandem stance - 3 x 45sec  Sit to stand without UE support - x25  SLS with intermittent UE support - x 25  Mini squats - 2 x 25 with UE support  Forward lunges - x20 with UE support  Hip Abduction with GTB - 2x20  Hip extension with GTB - 2x20 Step ups with 8" step  -- x25 Step taps onto 8" steps - x25 B * All exercise added to HEP         PT Education - 03/07/17 1028    Education provided Yes   Education Details Review HEP with handout   Person(s) Educated Patient   Methods Explanation;Demonstration   Comprehension Verbalized understanding;Returned demonstration  PT Long Term Goals - 03/07/17 1052      PT LONG TERM GOAL #1   Title Pt will be independent with HEP in order to improve strength and balance in order to decrease fall risk and improve function at home and work.   Baseline 10/30/16: Mod-min A for exericise progression/ performance 12/18/16: Requires minA for technique with exercise; 02/21/17: modA-maxA and cueing for muscle activation   Time 6   Period Weeks   Status On-going     PT LONG TERM GOAL #2   Title Pt will improve BERG by at least 3 points in order to demonstrate clinically significant improvement in balance.   Baseline 08/07/16: 49/56 10/02/16:  53/56   Time 8   Period Weeks   Status Achieved     PT LONG TERM GOAL #3   Title Pt will decrease TUG to below 14 seconds in order to demonstrate decreased fall risk    Baseline 08/07/16: 21.9 seconds; 10/02/16: 12.3    Time 8   Period Weeks   Status Achieved     PT LONG TERM GOAL #4   Title Pt will decrease 5TSTS by at least 3 seconds in order to demonstrate clinically significant improvement in LE strength   Baseline 08/07/16: 18.5 seconds; 10/02/16: 14.3   Time 8   Period Weeks   Status Achieved     PT LONG TERM GOAL #5   Title Pt will increase 10MWT by at least 0.13 m/s in order to demonstrate clinically significant improvement in community ambulation   Baseline 08/07/16: 0.53 m/s 10/02/16: .97 m/s   Time 8   Period Weeks   Status Achieved     PT LONG TERM GOAL #6   Title Pt will be able to swing a golf club safely in order to complete 9 holes of golf   Baseline 10/30/16: Unable to swing golf club safely 12/18/16: unable to swing golf club; 02/21/17: unable to swing golf club   Time 6   Period Weeks   Status On-going     PT LONG TERM GOAL #7   Title Pt will improve 6MWT by at least 12f  to demonstrate improved aerobic capacity/endurance   Baseline 582 ft 10/02/16: 11054f  Time 7   Period Weeks   Status Achieved     PT LONG TERM GOAL #8   Title Patient will improve DGI by 4 points to demonstrate improvement in dynamic gait/balance and decrease fall risk   Baseline 10/02/16: 19/24; 10/30/16: 21/24 12/18/16: 23/24   Time 6   Period Weeks   Status Achieved     PT LONG TERM GOAL  #9   TITLE Patient will demonstrate ability to perform single leg stance for >10 sec bilaterally to better be able to climb ladders   Baseline L LE SLS: 3 sec; L LE SLS: 4sec   Time 8   Period Weeks   Status On-going     PT LONG TERM GOAL  #10   TITLE Patient will be able to tandem walk 3021fithout loss of balance to better perform dynamic balance activities to perform occupational duties.    Baseline Requires stepping responses to maintain balance with tandem walking; 02/21/17: Requires 4 step downs to maintain balance   Time 8   Period Weeks   Status On-going     PT LONG TERM GOAL  #11   TITLE Pt will demonstrate 3/5 MMT strength in ankle eversion, toe ext and dorsiflexion to allow for the performance of  ankle strategies to maintain balance   Baseline 1+/5: toe ext; dorsiflexion 2+/5; ankle eversion: 1+/5   Time 6   Period Weeks   Status New               Plan - 03/07/17 1041    Clinical Impression Statement Focused on improving and advancing pt's HEP as patient is to be discharged from PT secondary to insurance limitations. Patient required minimal cueing to perform LE strengthening and coordination exercises indicating independence with home program. Patient continues to have demonstrate decreased ankle strategies when balancing on the affected LE. Patient is to be discharged from physical therapy secondary to financial limitations to the pt.     Rehab Potential Good   Clinical Impairments Affecting Rehab Potential Positive: motivation, prior functional level; Negative: no active dorsiflexion, increased LUE tone   PT Frequency 2x / week   PT Duration 6 weeks   PT Treatment/Interventions ADLs/Self Care Home Management;Biofeedback;Canalith Repostioning;Cryotherapy;Electrical Stimulation;Iontophoresis 69m/ml Dexamethasone;Aquatic Therapy;Moist Heat;Traction;Ultrasound;Gait training;Stair training;Functional mobility training;Therapeutic activities;DME Instruction;Therapeutic exercise;Balance training;Neuromuscular re-education;Cognitive remediation;Patient/family education;Orthotic Fit/Training;Manual techniques;Passive range of motion;Energy conservation;Taping;Vestibular   PT Next Visit Plan Cl[place]; DGI, LEFS, NME for L anterior tibialis, Walk-aide, progress balance, strengthening, and HEP; Consider sidelying hip abduction exercises   PT Home Exercise Plan Sit to stand,  seated tband resisted clams, seated adduction squeeze   Consulted and Agree with Plan of Care Patient      Patient will benefit from skilled therapeutic intervention in order to improve the following deficits and impairments:  Abnormal gait, Decreased balance, Decreased coordination, Decreased mobility, Decreased range of motion, Decreased strength, Difficulty walking, Impaired tone, Impaired UE functional use  Visit Diagnosis: Muscle weakness (generalized)  Unsteadiness on feet  Other lack of coordination     Problem List Patient Active Problem List   Diagnosis Date Noted  . 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 Neurocognitive disorder   . Left foot drop   . Benign essential HTN   . 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 Right basal ganglia embolic stroke ([place] 016/08/9603 . CVA (cerebral infarction) 07/15/2016  . TIA (transient ischemic attack) 07/14/2016  . HTN, goal below 140/80 07/14/2016  . Left facial numbness 07/14/2016  . Weakness of left side of body 07/14/2016    WBlythe Stanford PT DPT 03/07/2017, 10:53 AM  [place] [facility] [address] [address]Phone: [phone]  Fax:  [phone] Name: KMiciah CovelliMRN: [id number] of Birth: [birth year]

## 2017-03-07 NOTE — Therapy (Signed)
[place] MAIN [facility] SERVICES [address] [place], [place], [address] Phone: [phone]   Fax:  [phone]  Occupational Therapy Treatment/Discharge summary  Patient Details  Name: Jason Oconnor MRN: [id number] Date of Birth: 26-Apr-1956 No Data Recorded  Encounter Date: 03/07/2017      OT End of Session - 03/07/17 1553    Visit Number 45   Number of Visits 48   Date for OT Re-Evaluation 04/18/17   Authorization Type 16 of 20 visits for 2018 calendar year   OT Start Time 0842   OT Stop Time 0927   OT Time Calculation (min) 45 min   Activity Tolerance Patient tolerated treatment well   Behavior During Therapy [facility] for tasks assessed/performed      Past Medical History:  Diagnosis Date  . Bone marrow disease   . Hypertension     History reviewed. No pertinent surgical history.  There were no vitals filed for this visit.      Subjective Assessment - 03/07/17 1551    Subjective  Patient reports he feels he is ready to finish up with his therapy today and would like his exercises written down for his home program.  He would like to save a few visits for the year in case he needs them in the coming months.    Pertinent History Patient reports on July 13, 2016 he was helping a friend doing home repairs and felt tired, went home and started to notice problems moving around.  The next morning he feel trying to put his slippers on, wife transported him to ER and [facility] for 3 days and then went to [facility] for REhab and was discharged this past Saturday to home.     Patient Stated Goals "I want to get back to close as normal as possible, play golf again, work in the garage."     Currently in Pain? No/denies   Pain Score 0-No pain                      OT Treatments/Exercises (OP) - 03/07/17 1556      Fine Motor Coordination   Other Fine Motor Exercises Patient seen this date with manipulation of grooved pegs to place and  remove into board with left hand, translatory movements of the hand and using the hand for storage when removing.  Issued and instructed on coordination exercises for gross and fine motor coordination skills with multiple handouts provided with written directions.  Recommend patient chooses a couple of items from the lists for each day.       Neurological Re-education Exercises   Other Exercises 1 Patient seen for red theraband exercises for LUE shoulder flexion, diagonal patterns, elbow flexion extension with written ininstructions.  Issued a more comprehensive home program to include multiple ideas of exercises to work on at home, choosing 2-3 items from the list each day.  Patient demos understanding.  Patient performed wall ball exercises with overhead reach for LUE strength, coordination and reaction time, cues provided for how to grade exercise harder as he progresses at home.                 OT Education - 03/07/17 1552    Education provided Yes   Education Details Issued comprehensive written HEP for strength, coordination, speed, dexterity and ROM.     Person(s) Educated Patient   Methods Explanation;Demonstration;Handout   Comprehension Verbalized understanding;Returned demonstration  OT Long Term Goals - 03/07/17 1554      OT LONG TERM GOAL #1   Title Patient to improve left UE coordination sufficient to tie shoes with modified independence.    Baseline unable   Time 12   Period Weeks   Status Achieved     OT LONG TERM GOAL #2   Title Patient to improve LUE coordination to complete buttons, snaps and zippers on clothing with modified independence.   Time 12   Period Weeks   Status Achieved     OT LONG TERM GOAL #3   Title Patient to complete bathing with modified independence including using left arm to wash right side of body.    Time 12   Period Weeks   Status Achieved     OT LONG TERM GOAL #4   Title Patient to improve left hand use to type  on computer using both hands on home row keys, 2 lines with no errors in 5 minutes.    Baseline unable   Time 12   Period Weeks   Status Partially Met     OT LONG TERM GOAL #5   Title Patient will improve grip on left hand by 10# to stabilize jars and containers for opening.    Baseline unable   Time 12   Period Weeks   Status On-going     OT LONG TERM GOAL #6   Title Patient will be able to demonstrate sweeping with use of bilateral UEs with modified independence.    Baseline unable   Time 12   Period Weeks   Status Achieved     OT LONG TERM GOAL #7   Title Patient will improve grip strength in left hand to be able to stabilize utensils to cut meat with modified independence.    Baseline unable    Time 12   Period Weeks   Status Achieved     OT LONG TERM GOAL #8   Title Patient will be able to demonstrate increased strength in LUE to reach into microwave and retrieve plate of food with modified independence.   Baseline depends on the type of plate and how full it is.   Time 12   Period Weeks   Status Partially Met     OT LONG TERM GOAL  #9   Baseline Patient will improve left UE control and reach to operate left turning signal in the car with modified independence.    Time 12   Period Weeks   Status Achieved     OT LONG TERM GOAL  #10   TITLE Patient will be able to hold and manage large brush to wash his truck with modified independence.    Baseline unable   Time 12   Period Weeks   Status Achieved               Plan - 03/07/17 1554    Clinical Impression Statement Patient has limited therapy visits for the year and wants to save a few in case he needs them in coming months.  He was instructed on HEP and is able to demonstrate effectively from written program for strength, coordination, speed, dexterity and functional hand use.  He has met most of his goals, still working to refine his hand skills for typing and higher level tasks.  Continues to have some  left arm tightness and pain at times but responds well to stretching, ROM and HEP.  Will discharge patient at this time.  Rehab Potential Good   OT Frequency 2x / week   OT Duration 12 weeks   OT Treatment/Interventions Self-care/ADL training;Therapeutic exercise;Neuromuscular education;Visual/perceptual remediation/compensation;Therapist, nutritional;Therapeutic exercises;Patient/family education;DME and/or AE instruction;Manual Therapy;Therapeutic activities;Balance training   Consulted and Agree with Plan of Care Patient      Patient will benefit from skilled therapeutic intervention in order to improve the following deficits and impairments:  Decreased knowledge of use of DME, Decreased coordination, Impaired sensation, Decreased activity tolerance, Decreased range of motion, Decreased strength, Decreased balance, Difficulty walking, Impaired UE functional use  Visit Diagnosis: Muscle weakness (generalized)  Other lack of coordination    Problem List Patient Active Problem List   Diagnosis Date Noted  . 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 Neurocognitive disorder   . Left foot drop   . Benign essential HTN   . Hemiparesis affecting nondominant side as late effect of cerebrovascular accident (CVA) ([place]) 07/18/2016  . Right basal ganglia embolic stroke ([place]) 17/47/1595  . CVA (cerebral infarction) 07/15/2016  . TIA (transient ischemic attack) 07/14/2016  . HTN, goal below 140/80 07/14/2016  . Left facial numbness 07/14/2016  . Weakness of left side of body 07/14/2016   [name] Oneita Jolly, OTR/L, CLT  [name],[name] 03/07/2017, 4:03 PM  [place] MAIN [facility] SERVICES [address] [place], [place], [address] Phone: [phone]   Fax:  [phone]  Name: Jason Oconnor MRN: [id number] Date of Birth: 07-Apr-1956

## 2017-03-13 ENCOUNTER — Encounter: Payer: 59 | Attending: Physical Medicine & Rehabilitation

## 2017-03-13 ENCOUNTER — Encounter: Payer: Self-pay | Admitting: Physical Medicine & Rehabilitation

## 2017-03-13 ENCOUNTER — Ambulatory Visit (HOSPITAL_BASED_OUTPATIENT_CLINIC_OR_DEPARTMENT_OTHER): Payer: 59 | Admitting: Physical Medicine & Rehabilitation

## 2017-03-13 VITALS — BP 149/88 | HR 80 | Resp 14

## 2017-03-13 DIAGNOSIS — R279 Unspecified lack of coordination: Secondary | ICD-10-CM | POA: Insufficient documentation

## 2017-03-13 DIAGNOSIS — R269 Unspecified abnormalities of gait and mobility: Secondary | ICD-10-CM | POA: Diagnosis not present

## 2017-03-13 DIAGNOSIS — I10 Essential (primary) hypertension: Secondary | ICD-10-CM | POA: Diagnosis not present

## 2017-03-13 DIAGNOSIS — M21372 Foot drop, left foot: Secondary | ICD-10-CM | POA: Insufficient documentation

## 2017-03-13 DIAGNOSIS — I69354 Hemiplegia and hemiparesis following cerebral infarction affecting left non-dominant side: Secondary | ICD-10-CM | POA: Diagnosis not present

## 2017-03-13 DIAGNOSIS — I69359 Hemiplegia and hemiparesis following cerebral infarction affecting unspecified side: Secondary | ICD-10-CM

## 2017-03-13 DIAGNOSIS — R531 Weakness: Secondary | ICD-10-CM | POA: Diagnosis present

## 2017-03-13 NOTE — Progress Notes (Signed)
Subjective:    Patient ID: Jason Oconnor, male    DOB: [birth year], 61 y.o.   MRN: [id number] Presented to [facility] on July 14, 2016, with left-sided weakness and facial droop. Blood pressure elevated 170/106. Cranial CT scan negative. MRI of the brain showed acute infarct posterior basal ganglia on the right involving the posterior putamen. CTA of the head showed no emergent large vessel occlusion or stenosis. Carotid Dopplers with no ICA stenosis HPI Asking about foot up orthosis, currently  Needs to stretch quads and heel cord prior to getting up Toe curling when not wearing shoes   Tried baclofen and tizanidine without benefit  Pain Inventory Average Pain 1 Pain Right Now 1 My pain is intermittent and stabbing  In the last 24 hours, has pain interfered with the following? General activity 3 Relation with others 0 Enjoyment of life 5 What TIME of day is your pain at its worst? night Sleep (in general) Fair  Pain is worse with: sitting and inactivity Pain improves with: therapy/exercise Relief from Meds: 2  Mobility walk without assistance walk with assistance use a cane how many minutes can you walk? 30 ability to climb steps?  yes do you drive?  yes  Function disabled: date disabled . I need assistance with the following:  household duties  Neuro/Psych weakness spasms  Prior Studies Any changes since last visit?  no  Physicians involved in your care Any changes since last visit?  no   History reviewed. No pertinent family history. Social History   Social History  . Marital status: Married    Spouse name: N/A  . Number of children: N/A  . Years of education: N/A   Social History Main Topics  . Smoking status: Former Research scientist (life sciences)  . Smokeless tobacco: Never Used  . Alcohol use Yes     Comment: rare  . Drug use: No  . Sexual activity: Not Asked   Other Topics Concern  . None   Social History Narrative  . None    History reviewed. No pertinent surgical history. Past Medical History:  Diagnosis Date  . Bone marrow disease   . Hypertension    BP (!) 149/88 (BP Location: Left Arm, Patient Position: Sitting, Cuff Size: Normal)   Pulse 80   Resp 14   SpO2 96%   Opioid Risk Score:   Fall Risk Score:  `1  Depression screen PHQ 2/9  Depression screen PHQ 2/9 08/13/2016  Decreased Interest 0  Down, Depressed, Hopeless 0  PHQ - 2 Score 0  Altered sleeping 0  Tired, decreased energy 1  Change in appetite 0  Feeling bad or failure about yourself  0  Trouble concentrating 0  Moving slowly or fidgety/restless 1  Suicidal thoughts 0  PHQ-9 Score 2  Difficult doing work/chores Not difficult at all    Review of Systems  HENT: Negative.   Eyes: Negative.   Respiratory: Negative.   Cardiovascular: Negative.   Gastrointestinal: Negative.   Endocrine: Negative.   Genitourinary: Negative.   Musculoskeletal:       Spasms  Skin: Negative.   Allergic/Immunologic: Negative.   Neurological: Positive for syncope and weakness.  Hematological: Negative.   Psychiatric/Behavioral: Negative.   All other systems reviewed and are negative.      Objective:   Physical Exam  Constitutional: He is oriented to person, place, and time. He appears well-developed and well-nourished.  HENT:  Head: Normocephalic and atraumatic.  Eyes: Conjunctivae and EOM are normal. Pupils  are equal, round, and reactive to light.  Neck: Normal range of motion.  Musculoskeletal:       Right shoulder: He exhibits decreased strength.  No pain with left shoulder range of motion  Decreased left hip, knee and ankle range of motion, difficulty crossing left leg over the right knee    Neurological: He is alert and oriented to person, place, and time.  Motor strength 5/5 in the left deltoid, biceps, triceps, grip, hip flexor, knee extensor, ankle dorsi flexion  Ambulates with AFO. He has a stifflegged gait.     Psychiatric: He has a normal mood and affect.  Nursing note and vitals reviewed.         Assessment & Plan:  1. History of CVA with chronic left spastic hemiparesis. He needs to stretch frequently to reduce his tone. Oral anti-spasticity medications have not been very successful. Patient does not want to try botulinum toxin injections at this time. He has finished his outpatient therapy treatments. He should continue with home access program. 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  We discussed orthotic management. I do think he would be a good candidate for foot up orthosis. He should use this inside the house and only tried outside the home. Once he is used to walking with it inside the home  Physical medicine and rehabilitation follow-up on an as-needed basis  Over half of the 25 min visit was spent counseling and coordinating care.

## 2017-03-13 NOTE — Patient Instructions (Addendum)
When driving on long trip stop every 1-1.5 hrs  Foot up orthosis is ok around the house

## 2017-03-14 ENCOUNTER — Encounter: Payer: 59 | Admitting: Occupational Therapy

## 2017-03-20 ENCOUNTER — Encounter: Payer: 59 | Admitting: Occupational Therapy

## 2017-03-20 ENCOUNTER — Ambulatory Visit: Payer: 59

## 2017-03-28 ENCOUNTER — Encounter: Payer: 59 | Admitting: Occupational Therapy

## 2017-03-28 ENCOUNTER — Ambulatory Visit: Payer: 59

## 2017-04-04 ENCOUNTER — Ambulatory Visit: Payer: 59

## 2017-04-04 ENCOUNTER — Encounter: Payer: 59 | Admitting: Occupational Therapy

## 2017-04-10 ENCOUNTER — Ambulatory Visit: Payer: 59

## 2017-04-10 ENCOUNTER — Encounter: Payer: 59 | Admitting: Occupational Therapy

## 2017-04-18 ENCOUNTER — Encounter: Payer: 59 | Admitting: Occupational Therapy

## 2017-04-18 ENCOUNTER — Ambulatory Visit: Payer: 59

## 2017-04-24 ENCOUNTER — Ambulatory Visit: Payer: 59

## 2017-11-03 IMAGING — CT CT ANGIO HEAD
4 of 11 series · 16 of 47 positions shown · IV contrast (isovue)
Comparison: MRI head July 15, 2016 at 3725 hours and CT HEAD
July 14, 2016

CLINICAL DATA: LEFT-sided weakness, follow-up RIGHT basal ganglia
stroke 1 day ago. History of hypertension.

EXAM:
CT ANGIOGRAPHY HEAD
TECHNIQUE: Multidetector CT imaging of the head was performed using the
standard protocol during bolus administration of intravenous
contrast. Multiplanar CT image reconstructions and MIPs were
obtained to evaluate the vascular anatomy.
CONTRAST:  75 cc Isovue 370

[Series 8: cta head · axial · 0.47mm/px · z∈[-102,-6]mm · 5 of 72 slices shown]
[im 12/72  brain]
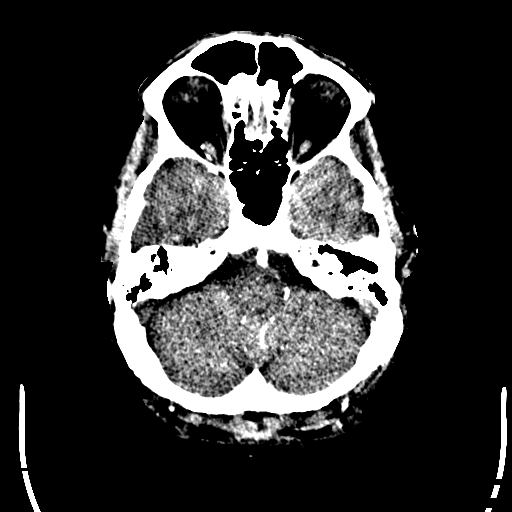
[im 24/72  bone]
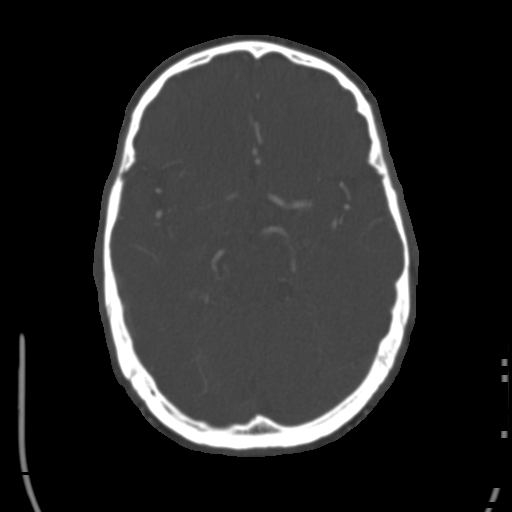
[im 36/72  brain]
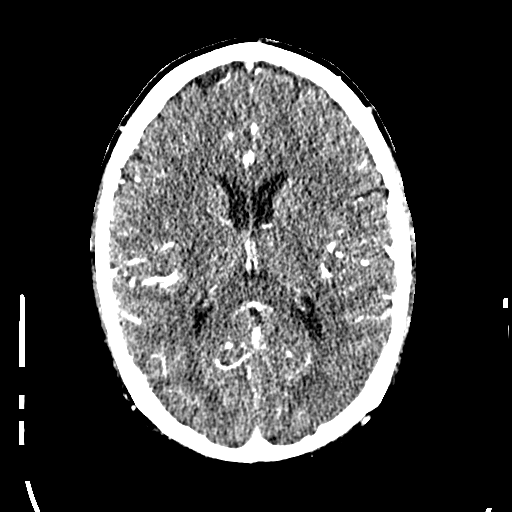
[im 48/72  bone]
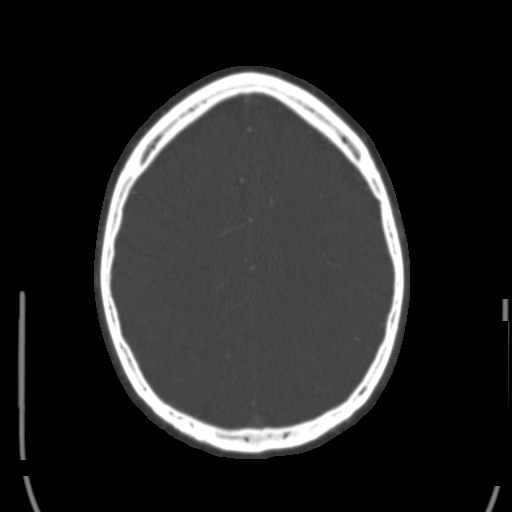
[im 60/72  brain]
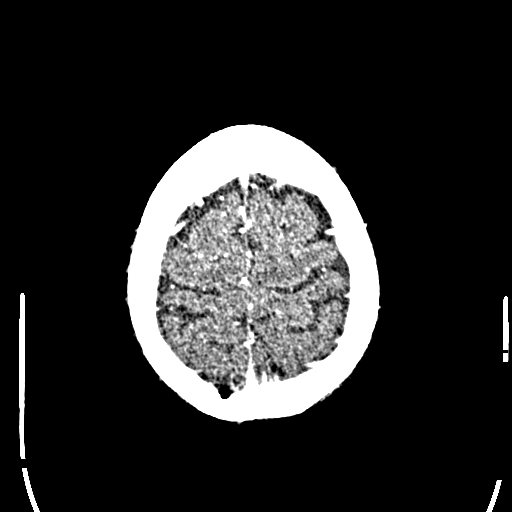

[Series 10: ax thin · axial · 0.33mm/px · z∈[-114,-49]mm · 5 of 143 slices shown]
[im 11/143  brain]
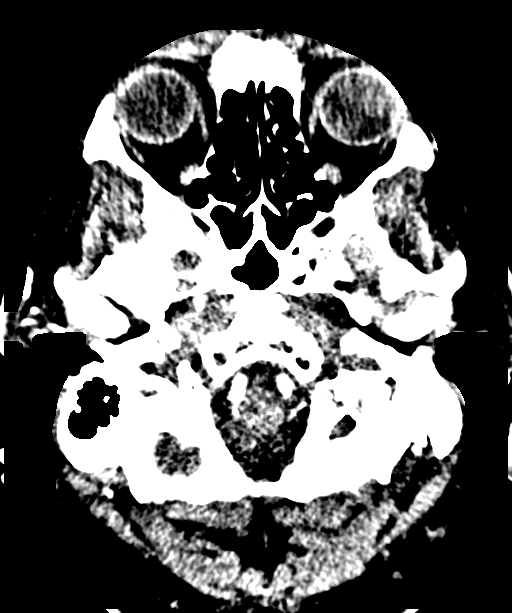
[im 33/143  brain]
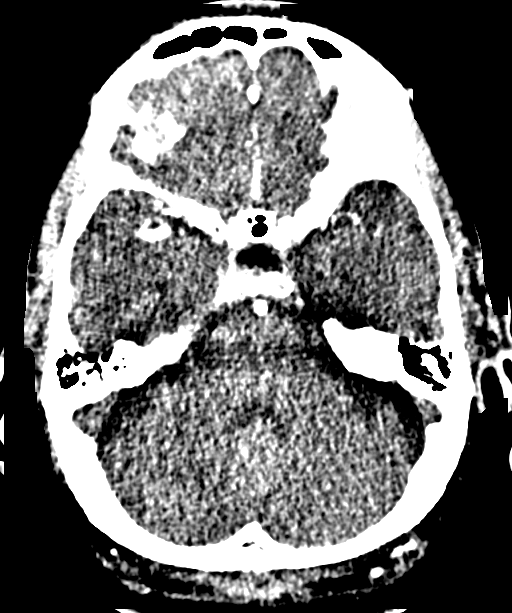
[im 44/143  brain]
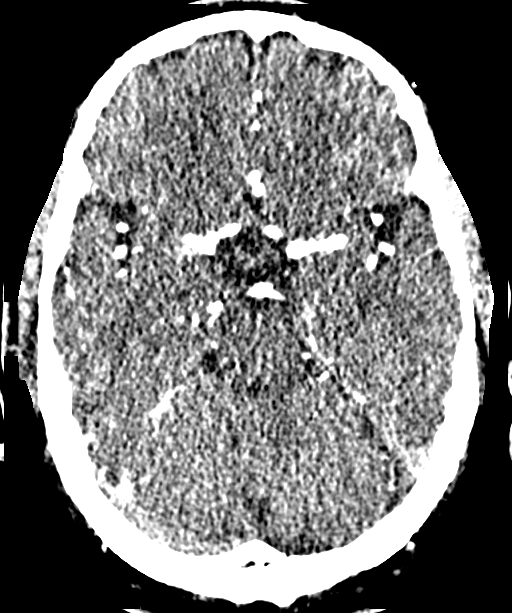
[im 66/143  brain]
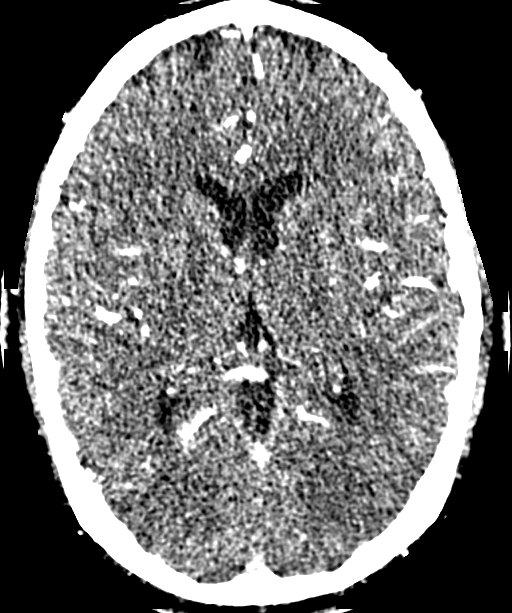
[im 77/143  brain]
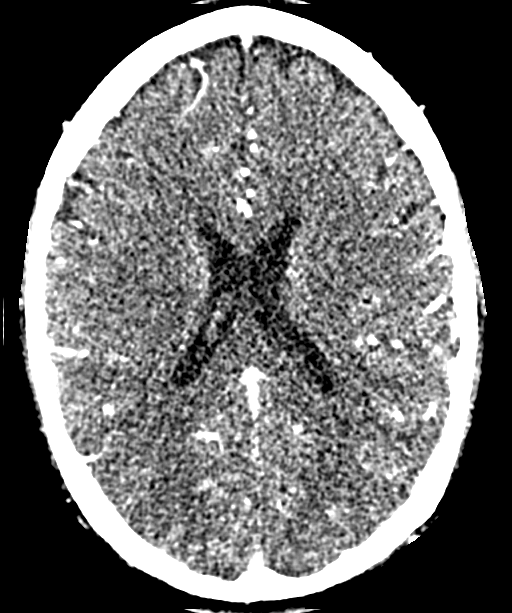

[Series 12: cor thin · coronal · 0.28mm/px · 3 of 201 slices shown]
[im 67/201  brain]
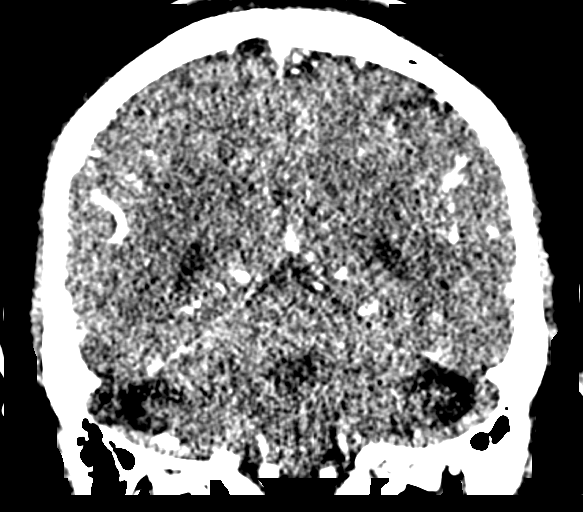
[im 101/201  brain]
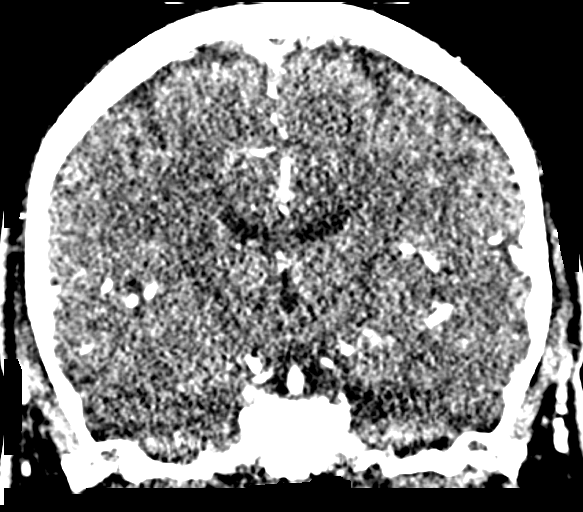
[im 134/201  brain]
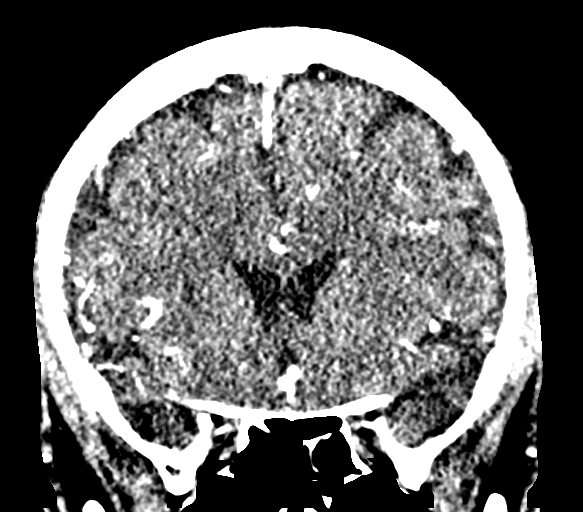

[Series 14: sag thin · sagittal · 0.29mm/px · 3 of 167 slices shown]
[im 42/167  brain]
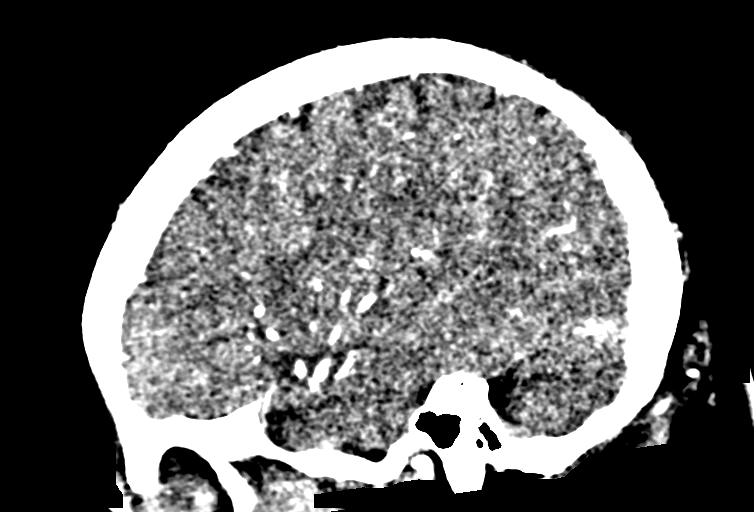
[im 84/167  brain]
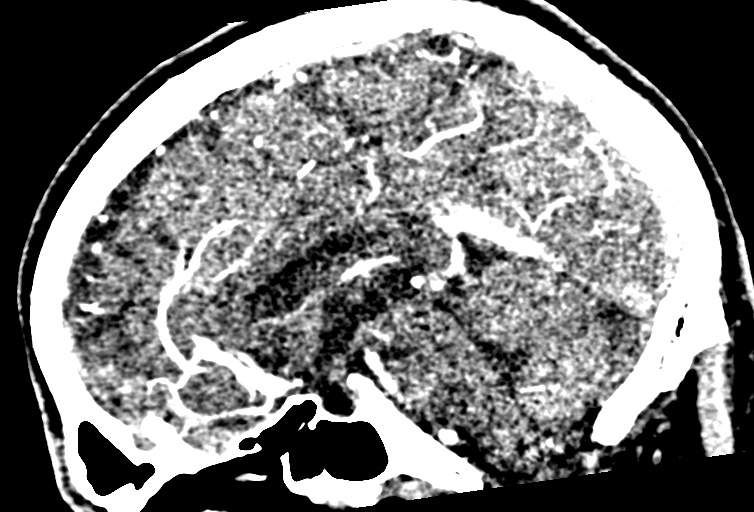
[im 125/167  brain]
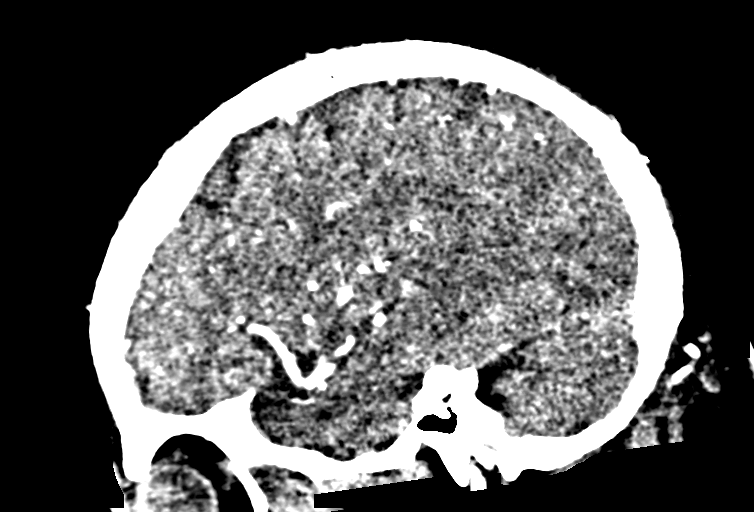

[16 of 47 positions shown; findings below may reference images not displayed]

FINDINGS: CT HEAD

BRAIN: The ventricles and sulci are normal. Faint hypodensities
RIGHT basal ganglia corresponding to known infarcts. No
intraparenchymal hemorrhage, mass effect nor midline shift. No acute
large vascular territory infarcts. No abnormal extra-axial fluid
collections. Basal cisterns are patent.

VASCULAR: Unremarkable.

SKULL/SOFT TISSUES: No skull fracture. No significant soft tissue
swelling.

ORBITS/SINUSES: The included ocular globes and orbital contents are
normal.The mastoid aircells and included paranasal sinuses are
well-aerated.

OTHER: None.

CTA HEAD

ANTERIOR CIRCULATION: Normal appearance of the cervical internal
carotid arteries, petrous, cavernous and supra clinoid internal
carotid arteries. Widely patent anterior communicating artery.
Normal appearance of the anterior and middle cerebral arteries.

No large vessel occlusion, hemodynamically significant stenosis,
dissection, luminal irregularity, contrast extravasation or
aneurysm. Mild dolichoectasia.

POSTERIOR CIRCULATION: RIGHT vertebral artery is dominant with
normal appearance of the vertebral arteries, vertebrobasilar
junction and basilar artery, as well as main branch vessels. Normal
appearance of the posterior cerebral arteries. Fetal origin RIGHT
posterior cerebral artery.

No large vessel occlusion, hemodynamically significant stenosis,
dissection, luminal irregularity, contrast extravasation or
aneurysm. Mild dolichoectasia.

VENOUS SINUSES: Major dural venous sinuses are patent though not
tailored for evaluation on this angiographic examination.

ANATOMIC VARIANTS: None.

DELAYED PHASE: No abnormal intracranial enhancement.
IMPRESSION: CT HEAD: Evolving nonhemorrhagic RIGHT basal ganglia infarct.
Otherwise negative CT HEAD.

CTA HEAD:  No emergent large vessel occlusion or severe stenosis.

Mild dolichoectasia associated with chronic hypertension.

## 2018-02-27 ENCOUNTER — Emergency Department (HOSPITAL_COMMUNITY): Payer: 59

## 2018-02-27 ENCOUNTER — Observation Stay (HOSPITAL_COMMUNITY): Payer: 59

## 2018-02-27 ENCOUNTER — Other Ambulatory Visit: Payer: Self-pay

## 2018-02-27 ENCOUNTER — Encounter (HOSPITAL_COMMUNITY): Payer: Self-pay | Admitting: *Deleted

## 2018-02-27 ENCOUNTER — Observation Stay (HOSPITAL_COMMUNITY)
Admission: EM | Admit: 2018-02-27 | Discharge: 2018-02-28 | Disposition: A | Discharge status: Home / Self Care | Payer: 59 | Attending: Family Medicine | Admitting: Family Medicine

## 2018-02-27 DIAGNOSIS — E782 Mixed hyperlipidemia: Secondary | ICD-10-CM

## 2018-02-27 DIAGNOSIS — R531 Weakness: Secondary | ICD-10-CM | POA: Diagnosis present

## 2018-02-27 DIAGNOSIS — E785 Hyperlipidemia, unspecified: Secondary | ICD-10-CM | POA: Diagnosis present

## 2018-02-27 DIAGNOSIS — Z7982 Long term (current) use of aspirin: Secondary | ICD-10-CM | POA: Insufficient documentation

## 2018-02-27 DIAGNOSIS — Z79899 Other long term (current) drug therapy: Secondary | ICD-10-CM | POA: Insufficient documentation

## 2018-02-27 DIAGNOSIS — I6300 Cerebral infarction due to thrombosis of unspecified precerebral artery: Secondary | ICD-10-CM

## 2018-02-27 DIAGNOSIS — K219 Gastro-esophageal reflux disease without esophagitis: Secondary | ICD-10-CM | POA: Diagnosis present

## 2018-02-27 DIAGNOSIS — I10 Essential (primary) hypertension: Secondary | ICD-10-CM | POA: Diagnosis not present

## 2018-02-27 DIAGNOSIS — I639 Cerebral infarction, unspecified: Principal | ICD-10-CM | POA: Diagnosis present

## 2018-02-27 HISTORY — DX: Pure hypercholesterolemia, unspecified: E78.00

## 2018-02-27 HISTORY — DX: Cerebral infarction, unspecified: I63.9

## 2018-02-27 LAB — COMPREHENSIVE METABOLIC PANEL
ALBUMIN: 4.5 g/dL (ref 3.5–5.0)
ALT: 20 U/L (ref 17–63)
ANION GAP: 10 (ref 5–15)
AST: 24 U/L (ref 15–41)
Alkaline Phosphatase: 45 U/L (ref 38–126)
BUN: 17 mg/dL (ref 6–20)
CHLORIDE: 105 mmol/L (ref 101–111)
CO2: 23 mmol/L (ref 22–32)
Calcium: 9.8 mg/dL (ref 8.9–10.3)
Creatinine, Ser: 1.16 mg/dL (ref 0.61–1.24)
GFR calc Af Amer: >60 mL/min (ref >60)
GFR calc non Af Amer: >60 mL/min (ref >60)
GLUCOSE: 106 mg/dL — AB (ref 65–99)
POTASSIUM: 4.8 mmol/L (ref 3.5–5.1)
Sodium: 138 mmol/L (ref 135–145)
TOTAL PROTEIN: 7.9 g/dL (ref 6.5–8.1)
Total Bilirubin: 0.6 mg/dL (ref 0.3–1.2)

## 2018-02-27 LAB — APTT: APTT: 38 s — AB (ref 24–36)

## 2018-02-27 LAB — CBC
HCT: 43 % (ref 39.0–52.0)
Hemoglobin: 14.4 g/dL (ref 13.0–17.0)
MCH: 29.8 pg (ref 26.0–34.0)
MCHC: 33.5 g/dL (ref 30.0–36.0)
MCV: 88.8 fL (ref 78.0–100.0)
PLATELETS: 262 10*3/uL (ref 150–400)
RBC: 4.84 MIL/uL (ref 4.22–5.81)
RDW: 12.9 % (ref 11.5–15.5)
WBC: 6.5 10*3/uL (ref 4.0–10.5)

## 2018-02-27 LAB — DIFFERENTIAL
BASOS ABS: 0 10*3/uL (ref 0.0–0.1)
BASOS PCT: 1 %
EOS ABS: 0.2 10*3/uL (ref 0.0–0.7)
EOS PCT: 3 %
Lymphocytes Relative: 34 %
Lymphs Abs: 2.2 10*3/uL (ref 0.7–4.0)
Monocytes Absolute: 0.4 10*3/uL (ref 0.1–1.0)
Monocytes Relative: 7 %
NEUTROS PCT: 57 %
Neutro Abs: 3.7 10*3/uL (ref 1.7–7.7)

## 2018-02-27 LAB — I-STAT CHEM 8, ED
BUN: 19 mg/dL (ref 6–20)
CALCIUM ION: 1.24 mmol/L (ref 1.15–1.40)
CHLORIDE: 104 mmol/L (ref 101–111)
Creatinine, Ser: 1.1 mg/dL (ref 0.61–1.24)
Glucose, Bld: 100 mg/dL — ABNORMAL HIGH (ref 65–99)
HEMATOCRIT: 44 % (ref 39.0–52.0)
Hemoglobin: 15 g/dL (ref 13.0–17.0)
POTASSIUM: 4.7 mmol/L (ref 3.5–5.1)
SODIUM: 140 mmol/L (ref 135–145)
TCO2: 25 mmol/L (ref 22–32)

## 2018-02-27 LAB — PROTIME-INR
INR: 1.02
PROTHROMBIN TIME: 13.4 s (ref 11.4–15.2)

## 2018-02-27 LAB — I-STAT TROPONIN, ED: Troponin i, poc: 0 ng/mL (ref 0.00–0.08)

## 2018-02-27 LAB — CBG MONITORING, ED: GLUCOSE-CAPILLARY: 88 mg/dL (ref 65–99)

## 2018-02-27 MED ORDER — ENOXAPARIN SODIUM 40 MG/0.4ML ~~LOC~~ SOLN
40.0000 mg | Freq: Every day | SUBCUTANEOUS | Status: DC
  Administered 2018-02-27: 40 mg via SUBCUTANEOUS
  Filled 2018-02-27: qty 0.4

## 2018-02-27 MED ORDER — IOPAMIDOL (ISOVUE-370) INJECTION 76%
50.0000 mL | Freq: Once | INTRAVENOUS | Status: AC | PRN
  Administered 2018-02-27: 50 mL via INTRAVENOUS

## 2018-02-27 MED ORDER — ONDANSETRON HCL 4 MG/2ML IJ SOLN: 4.0000 mg | Freq: Three times a day (TID) | INTRAMUSCULAR | Status: DC | PRN

## 2018-02-27 MED ORDER — ACETAMINOPHEN 325 MG PO TABS: 650.0000 mg | ORAL_TABLET | ORAL | Status: DC | PRN

## 2018-02-27 MED ORDER — STROKE: EARLY STAGES OF RECOVERY BOOK
Freq: Once | Status: AC
  Administered 2018-02-27
  Filled 2018-02-27: qty 1

## 2018-02-27 MED ORDER — PANTOPRAZOLE SODIUM 40 MG PO TBEC: 40.0000 mg | DELAYED_RELEASE_TABLET | Freq: Every day | ORAL | Status: DC

## 2018-02-27 MED ORDER — SENNOSIDES-DOCUSATE SODIUM 8.6-50 MG PO TABS: 1.0000 | ORAL_TABLET | Freq: Every evening | ORAL | Status: DC | PRN

## 2018-02-27 MED ORDER — HYDRALAZINE HCL 20 MG/ML IJ SOLN
5.0000 mg | INTRAMUSCULAR | Status: DC | PRN
Start: 2018-02-27 — End: 2018-02-28

## 2018-02-27 MED ORDER — CLOPIDOGREL BISULFATE 75 MG PO TABS
75.0000 mg | ORAL_TABLET | Freq: Every day | ORAL | Status: DC
  Administered 2018-02-28: 75 mg via ORAL
  Filled 2018-02-27: qty 1

## 2018-02-27 MED ORDER — ACETAMINOPHEN 650 MG RE SUPP: 650.0000 mg | RECTAL | Status: DC | PRN

## 2018-02-27 MED ORDER — GADOBENATE DIMEGLUMINE 529 MG/ML IV SOLN
20.0000 mL | Freq: Once | INTRAVENOUS | Status: AC
  Administered 2018-02-27: 20 mL via INTRAVENOUS

## 2018-02-27 MED ORDER — ZOLPIDEM TARTRATE 5 MG PO TABS
5.0000 mg | ORAL_TABLET | Freq: Every evening | ORAL | Status: DC | PRN
Start: 2018-02-27 — End: 2018-02-28

## 2018-02-27 MED ORDER — SODIUM CHLORIDE 0.9 % IV SOLN
INTRAVENOUS | Status: DC
  Administered 2018-02-27: 23:00:00 via INTRAVENOUS

## 2018-02-27 MED ORDER — ACETAMINOPHEN 160 MG/5ML PO SOLN: 650.0000 mg | ORAL | Status: DC | PRN

## 2018-02-27 MED ORDER — VITAMIN D 1000 UNITS PO TABS
1000.0000 [IU] | ORAL_TABLET | Freq: Every day | ORAL | Status: DC
  Administered 2018-02-28: 1000 [IU] via ORAL
  Filled 2018-02-27: qty 1

## 2018-02-27 MED ORDER — METHOCARBAMOL 500 MG PO TABS: 500.0000 mg | ORAL_TABLET | Freq: Three times a day (TID) | ORAL | Status: DC | PRN

## 2018-02-27 MED ORDER — IOPAMIDOL (ISOVUE-370) INJECTION 76%
INTRAVENOUS | Status: AC
  Filled 2018-02-27: qty 50

## 2018-02-27 MED ORDER — EZETIMIBE 10 MG PO TABS
10.0000 mg | ORAL_TABLET | Freq: Every day | ORAL | Status: DC
  Administered 2018-02-28: 10 mg via ORAL
  Filled 2018-02-27: qty 1

## 2018-02-27 NOTE — ED Provider Notes (Signed)
[place] EMERGENCY DEPARTMENT Provider Note   CSN: [id number] Arrival date & time: 02/27/18  1308     History   Chief Complaint Chief Complaint  Patient presents with  . Weakness    HPI Jason Oconnor is a 62 y.o. male.  The history is provided by the patient. No language interpreter was used.  Weakness  Primary symptoms include focal weakness. This is a new problem. The current episode started 2 days ago. The problem has not changed since onset.There was left upper extremity focality noted. There has been no fever. Pertinent negatives include no shortness of breath, no chest pain and no altered mental status. There were no medications administered prior to arrival. Associated medical issues do not include trauma.  Pt reports he began feeling wonky 2 days ago.  Pt noticed weakness in left arm.  Pt reports his symptoms feel similar to when he had a stroke 2 years ago.  Pt has some residual weakness to his left side after previous stroke.  Pt reports he feels weaker on left side when he gets tired.    Past Medical History:  Diagnosis Date  . Bone marrow disease   . High cholesterol   . Hypertension   . Stroke [facility])     Patient Active Problem List   Diagnosis Date Noted  . 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 Neurocognitive disorder   . Left foot drop   . Benign essential HTN   . 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 Right basal ganglia embolic stroke ([place]) 78/29/5621  . CVA (cerebral infarction) 07/15/2016  . TIA (transient ischemic attack) 07/14/2016  . HTN, goal below 140/80 07/14/2016  . Left facial numbness 07/14/2016  . Weakness of left side of body 07/14/2016    History reviewed. No pertinent surgical history.      Home Medications    Prior to Admission medications   Medication Sig Start Date End Date Taking? Authorizing Provider    acetaminophen (TYLENOL) 325 MG tablet Take 2 tablets (650 mg total) by mouth every 6 (six) hours as needed for mild pain (or Fever >/= 101). 07/16/16   Wieting, Richard, MD  amLODipine (NORVASC) 5 MG tablet Take 1 tablet (5 mg total) by mouth every evening. 08/02/16   Love, Ivan Anchors, PA-C  Cholecalciferol (VITAMIN D3) 1000 units CAPS Take 1,000 Units by mouth every morning.    [provider]  clopidogrel (PLAVIX) 75 MG tablet Take 1 tablet (75 mg total) by mouth daily. 08/02/16   Love, Ivan Anchors, PA-C  ezetimibe (ZETIA) 10 MG tablet Take 10 mg by mouth daily. 08/07/16 08/07/17  [provider]  pantoprazole (PROTONIX) 40 MG tablet Take 1 tablet (40 mg total) by mouth daily. 08/02/16   LoveIvan Anchors, PA-C    Family History No family history on file.  Social History Social History   Tobacco Use  . Smoking status: Former Research scientist (life sciences)  . Smokeless tobacco: Never Used  Substance Use Topics  . Alcohol use: Yes    Comment: rare  . Drug use: No     Allergies   Lipitor [atorvastatin]; Baclofen; Lisinopril; and Losartan   Review of Systems Review of Systems  Respiratory: Negative for shortness of breath.   Cardiovascular: Negative for chest pain.  Neurological: Positive for focal weakness and weakness.  All other systems reviewed and are negative.    Physical Exam Updated Vital Signs BP Marland Kitchen)  141/91   Pulse 64   Temp 98.3 F (36.8 C)   Resp 14   SpO2 100%   Physical Exam  Constitutional: He appears well-developed and well-nourished.  HENT:  Head: Normocephalic and atraumatic.  Eyes: Conjunctivae are normal.  Neck: Neck supple.  Cardiovascular: Normal rate and regular rhythm.  No murmur heard. Pulmonary/Chest: Effort normal and breath sounds normal. No respiratory distress.  Abdominal: Soft. There is no tenderness.  Musculoskeletal: He exhibits no edema.  Neurological: He is alert. He displays normal reflexes. No cranial nerve deficit. He exhibits normal muscle tone.  Coordination normal.  Decreased strength left grip,  Left side slightly weaker.    Skin: Skin is warm and dry.  Psychiatric: He has a normal mood and affect.  Nursing note and vitals reviewed.    ED Treatments / Results  Labs (all labs ordered are listed, but only abnormal results are displayed) Labs Reviewed  APTT - Abnormal; Notable for the following components:      Result Value   aPTT 38 (*)    All other components within normal limits  COMPREHENSIVE METABOLIC PANEL - Abnormal; Notable for the following components:   Glucose, Bld 106 (*)    All other components within normal limits  I-STAT CHEM 8, ED - Abnormal; Notable for the following components:   Glucose, Bld 100 (*)    All other components within normal limits  PROTIME-INR  CBC  DIFFERENTIAL  I-STAT TROPONIN, ED  CBG MONITORING, ED    EKG None  Radiology Ct Head Wo Contrast  Result Date: 02/27/2018 CLINICAL DATA:  Left-sided weakness and dizziness for 2 days. EXAM: CT HEAD WITHOUT CONTRAST TECHNIQUE: Contiguous axial images were obtained from the base of the skull through the vertex without intravenous contrast. COMPARISON:  07/15/2016 FINDINGS: Brain: Remote perforator infarct affecting the right Corona radiata and posterior putamen. No evidence of acute infarct. No hemorrhage, hydrocephalus, or masslike finding. Vascular: No hyperdense vessel or unexpected calcification. Skull: Normal. Negative for fracture or focal lesion. Sinuses/Orbits: No acute finding. IMPRESSION: 1. No acute finding. 2. Remote perforator infarct affecting the right putamen and corona radiata. Electronically Signed   By: Monte Fantasia M.D.   On: 02/27/2018 13:50   Mr Jeri Cos JH Contrast  Result Date: 02/27/2018 CLINICAL DATA:  Dizziness and LEFT-sided weakness for 2 days, progressively worsening. On Plavix. History of LEFT arm weakness, stroke, hypertension and hypercholesterolemia. EXAM: MRI HEAD WITHOUT AND WITH CONTRAST TECHNIQUE:  Multiplanar, multiecho pulse sequences of the brain and surrounding structures were obtained without and with intravenous contrast. CONTRAST:  61mL MULTIHANCE GADOBENATE DIMEGLUMINE 529 MG/ML IV SOLN COMPARISON:  CT HEAD February 27, 2018 and MRI of the head July 15, 2016 FINDINGS: INTRACRANIAL CONTENTS: 4 mm reduced diffusion RIGHT inferior cerebellum, not well characterized on ADC map, no enhancement. Associated T2 hyperintense signal. Old tiny LEFT inferior cerebellar infarct new from prior MRI. Old cystic RIGHT basal ganglia infarct with minimal surrounding encephalomalacia. Subcentimeter linear T2 hyperintensity LEFT thalamus seen with old lacunar infarct, less likely perivascular space. A few scattered supratentorial white matter FLAIR T2 hyperintensities compatible with mild chronic small vessel ischemic disease. No midline shift, mass effect or masses. No abnormal intraparenchymal or extra-axial enhancement. VASCULAR: Normal major intracranial vascular flow voids present at skull base. SKULL AND UPPER CERVICAL SPINE: No abnormal sellar expansion. No suspicious calvarial bone marrow signal. Craniocervical junction maintained. SINUSES/ORBITS: The mastoid air-cells and included paranasal sinuses are well-aerated.The included ocular globes and orbital contents are non-suspicious. OTHER: None.  IMPRESSION: 1. Acute 4 mm RIGHT inferior cerebellar infarct. New nonacute subcentimeter LEFT inferior cerebellar infarct. 2. Old RIGHT basal ganglia infarct. Old LEFT thalamus lacunar infarct versus perivascular space. Electronically Signed   By: Elon Alas M.D.   On: 02/27/2018 20:12    Procedures Procedures (including critical care time)  Medications Ordered in ED Medications  gadobenate dimeglumine (MULTIHANCE) injection 20 mL (20 mLs Intravenous Contrast Given 02/27/18 2002)     Initial Impression / Assessment and Plan / ED Course  I have reviewed the triage vital signs and the nursing  notes.  Pertinent labs & imaging results that were available during my care of the patient were reviewed by me and considered in my medical decision making (see chart for details).     MDM  Ct scan normal.  MRI ordered and shows a 20mm right inferior cerebellar infarct and a new subcentimeter left inferior cerebellar infarct.   I spoke with Dr. Rory Percy who will see here.   He advised admit to Medicine. Medicine consulted for admission. I spoke to Dr. Blaine Hamper who will admit.  Pt counseled on results   Final Clinical Impressions(s) / ED Diagnoses   Final diagnoses:  Cerebrovascular accident (CVA), unspecified mechanism [facility])    ED Discharge Orders    None       Sidney Ace 02/27/18 2222    Charlesetta Shanks, MD 03/01/18 2125

## 2018-02-27 NOTE — ED Notes (Signed)
Patient transported to MRI 

## 2018-02-27 NOTE — ED Triage Notes (Signed)
Pt in c/o dizziness and L sided weakness onset x 2 days ago progressively worsening, pt takes Plavix, pt denies slurred speech, hx of L arm weakness, pt A&O x4

## 2018-02-27 NOTE — ED Notes (Signed)
Patient transported to CT 

## 2018-02-27 NOTE — ED Provider Notes (Signed)
Medical screening examination/treatment/procedure(s) were conducted as a shared visit with non-physician practitioner(s) and myself.  I personally evaluated the patient during the encounter.  EKG Interpretation  Date/Time:  Friday February 27 2018 13:17:48 EDT Ventricular Rate:  77 PR Interval:  146 QRS Duration: 98 QT Interval:  396 QTC Calculation: 448 R Axis:   22 Text Interpretation:  Normal sinus rhythm Normal ECG agree. no change Confirmed by Charlesetta Shanks [phone]) on 02/27/2018 9:32:16 PM  Patient developed weakness 2 days ago.  He thought he was fatigued and noted some decreased coordination and strength in the left leg and arm.  He reports he also felt a little "wonky in the head".  A type of dizziness and pressure-like discomfort.  He denies any severe, sudden or persistent headache.  No visual changes.  Patient is alert and appropriate.  Mental status is clear.  Airway is clear.  Patient can perform grip strength bilaterally but does exhibit some incoordination and movement with the left arm and left leg.  No a aphasia.  MRI does identify subacute\acute CVA.  Neurology consulted.  I agree with plan of management.   Charlesetta Shanks, MD 02/27/18 2134

## 2018-02-27 NOTE — H&P (Signed)
History and Physical    Ozzie Knobel GHW:[id number] DOB: [birth year] DOA: 02/27/2018  Referring MD/NP/PA:   PCP: Andi Devon, MD   Patient coming from:  The patient is coming from home.  At baseline, pt is independent for most of ADL.  Chief Complaint: dizziness and left sided weakness  HPI: Jason Oconnor is a 62 y.o. male with medical history significant of Stroke with mild left-sided weakness, hypertension, hyperlipidemia, GERD, bone marrow disease who presents with dizziness, and worsening left-sided weakness.  Patient states that he has chronic mild left-sided weakness and mild numbness from previous stroke. In the past 2 days, he has been feeling worsening weakness in left arm and leg. No vision change, hearing loss. No slurred speech, facial droop. He also has dizziness. Patient denies chest pain, shortness of breath, cough, fever or chills. No nausea, vomiting, diarrhea, abdominal pain, symptoms of UTI. He has a foot drop for which he uses a brace.  ED Course: pt was found to have WBC 6.5, INR 1.02, negative troponin, creatinine 1.16, GFR>60, temperature normal, no tachycardia, no tachypnea, oxygen saturation section 99% on room air. CT head is negative for acute issues, but showed remote infarction. Pt is placed on tele bed for obs. Neuro, dr. Rory Percy was consulted.  MRI-brain showed  1. Acute 4 mm RIGHT inferior cerebellar infarct. New nonacute subcentimeter LEFT inferior cerebellar infarct. 2. Old RIGHT basal ganglia infarct. Old LEFT thalamus lacunar infarct versus perivascular space.  Review of Systems:   General: no fevers, chills, no body weight gain, has fatigue HEENT: no blurry vision, hearing changes or sore throat Respiratory: no dyspnea, coughing, wheezing CV: no chest pain, no palpitations GI: no nausea, vomiting, abdominal pain, diarrhea, constipation GU: no dysuria, burning on urination, increased urinary frequency, hematuria  Ext: no leg edema Neuro: has  dizziness and left sided weakenss Skin: no rash, no skin tear. MSK: No muscle spasm, no deformity, no limitation of range of movement in spin Heme: No easy bruising.  Travel history: No recent long distant travel.  Allergy:  Allergies  Allergen Reactions  . Lipitor [Atorvastatin] Other (See Comments)    Caused blood clots  . Baclofen Other (See Comments)    "Muscles relaxed too much on left side"  . Lisinopril Other (See Comments)    Dizziness  . Losartan Other (See Comments)    Dizziness    Past Medical History:  Diagnosis Date  . Bone marrow disease   . High cholesterol   . Hypertension   . Stroke [facility])     Past Surgical History:  Procedure Laterality Date  . ELBOW SURGERY Right    Tendinitis    Social History:  reports that he has quit smoking. He has never used smokeless tobacco. He reports that he drinks alcohol. He reports that he does not use drugs.  Family History:  Family History  Problem Relation Age of Onset  . Diabetes Mellitus II Mother   . Transient ischemic attack Mother   . Breast cancer Sister   . Heart attack Brother      Prior to Admission medications   Medication Sig Start Date End Date Taking? Authorizing Provider  acetaminophen (TYLENOL) 325 MG tablet Take 2 tablets (650 mg total) by mouth every 6 (six) hours as needed for mild pain (or Fever >/= 101). 07/16/16   Wieting, Richard, MD  amLODipine (NORVASC) 5 MG tablet Take 1 tablet (5 mg total) by mouth every evening. 08/02/16   Bary Leriche, PA-C  Cholecalciferol (  VITAMIN D3) 1000 units CAPS Take 1,000 Units by mouth every morning.    [provider]  clopidogrel (PLAVIX) 75 MG tablet Take 1 tablet (75 mg total) by mouth daily. 08/02/16   Love, Ivan Anchors, PA-C  ezetimibe (ZETIA) 10 MG tablet Take 10 mg by mouth daily. 08/07/16 02/27/18  [provider]  pantoprazole (PROTONIX) 40 MG tablet Take 1 tablet (40 mg total) by mouth daily. 08/02/16   Bary Leriche, PA-C    Physical  Exam: Vitals:   02/27/18 2046 02/27/18 2130 02/27/18 2145 02/27/18 2215  BP: (!) 170/99 92/74 (!) 154/98 (!) 141/89  Pulse: 65 72 71 (!) 58  Resp: 14 16 14 12   Temp:      TempSrc:      SpO2: 100% 99% 99% 97%   General: Not in acute distress HEENT:       Eyes: PERRL, EOMI, no scleral icterus.       ENT: No discharge from the ears and nose, no pharynx injection, no tonsillar enlargement.        Neck: No JVD, no bruit, no mass felt. Heme: No neck lymph node enlargement. Cardiac: S1/S2, RRR, No murmurs, No gallops or rubs. Respiratory: No rales, wheezing, rhonchi or rubs. GI: Soft, nondistended, nontender, no rebound pain, no organomegaly, BS present. GU: No hematuria Ext: No pitting leg edema bilaterally. 2+DP/PT pulse bilaterally. Musculoskeletal: No joint deformities, No joint redness or warmth, no limitation of ROM in spin. Skin: No rashes.  Neuro: Alert, oriented X3, cranial nerves II-XII grossly intact, moves all extremities normally. muscle strength slightly weaker in left arm and leg than in the right. Sensation to light touch intact. Brachial reflex 2+ bilaterally. Negative Babinski's sign. Normal finger to nose test. Psych: Patient is not psychotic, no suicidal or hemocidal ideation.  Labs on Admission: I have personally reviewed following labs and imaging studies  CBC: Recent Labs  Lab 02/27/18 1319 02/27/18 1335  WBC 6.5  --   NEUTROABS 3.7  --   HGB 14.4 15.0  HCT 43.0 44.0  MCV 88.8  --   PLT 262  --    Basic Metabolic Panel: Recent Labs  Lab 02/27/18 1319 02/27/18 1335  NA 138 140  K 4.8 4.7  CL 105 104  CO2 23  --   GLUCOSE 106* 100*  BUN 17 19  CREATININE 1.16 1.10  CALCIUM 9.8  --    GFR: CrCl cannot be calculated (Unknown ideal weight.). Liver Function Tests: Recent Labs  Lab 02/27/18 1319  AST 24  ALT 20  ALKPHOS 45  BILITOT 0.6  PROT 7.9  ALBUMIN 4.5   No results for input(s): LIPASE, AMYLASE in the last 168 hours. No results for  input(s): AMMONIA in the last 168 hours. Coagulation Profile: Recent Labs  Lab 02/27/18 1319  INR 1.02   Cardiac Enzymes: No results for input(s): CKTOTAL, CKMB, CKMBINDEX, TROPONINI in the last 168 hours. BNP (last 3 results) No results for input(s): PROBNP in the last 8760 hours. HbA1C: No results for input(s): HGBA1C in the last 72 hours. CBG: Recent Labs  Lab 02/27/18 1852  GLUCAP 88   Lipid Profile: No results for input(s): CHOL, HDL, LDLCALC, TRIG, CHOLHDL, LDLDIRECT in the last 72 hours. Thyroid Function Tests: No results for input(s): TSH, T4TOTAL, FREET4, T3FREE, THYROIDAB in the last 72 hours. Anemia Panel: No results for input(s): VITAMINB12, FOLATE, FERRITIN, TIBC, IRON, RETICCTPCT in the last 72 hours. Urine analysis:    Component Value Date/Time  COLORURINE STRAW (A) 07/14/2016 1220   APPEARANCEUR CLEAR (A) 07/14/2016 1220   LABSPEC 1.009 07/14/2016 1220   PHURINE 6.0 07/14/2016 1220   GLUCOSEU NEGATIVE 07/14/2016 1220   HGBUR NEGATIVE 07/14/2016 1220   BILIRUBINUR NEGATIVE 07/14/2016 1220   KETONESUR NEGATIVE 07/14/2016 1220   PROTEINUR NEGATIVE 07/14/2016 1220   NITRITE NEGATIVE 07/14/2016 1220   LEUKOCYTESUR NEGATIVE 07/14/2016 1220   Sepsis Labs: @LABRCNTIP (procalcitonin:4,lacticidven:4) )No results found for this or any previous visit (from the past 240 hour(s)).   Radiological Exams on Admission: Ct Head Wo Contrast  Result Date: 02/27/2018 CLINICAL DATA:  Left-sided weakness and dizziness for 2 days. EXAM: CT HEAD WITHOUT CONTRAST TECHNIQUE: Contiguous axial images were obtained from the base of the skull through the vertex without intravenous contrast. COMPARISON:  07/15/2016 FINDINGS: Brain: Remote perforator infarct affecting the right Corona radiata and posterior putamen. No evidence of acute infarct. No hemorrhage, hydrocephalus, or masslike finding. Vascular: No hyperdense vessel or unexpected calcification. Skull: Normal. Negative for  fracture or focal lesion. Sinuses/Orbits: No acute finding. IMPRESSION: 1. No acute finding. 2. Remote perforator infarct affecting the right putamen and corona radiata. Electronically Signed   By: Monte Fantasia M.D.   On: 02/27/2018 13:50   Mr Jeri Cos ZS Contrast  Result Date: 02/27/2018 CLINICAL DATA:  Dizziness and LEFT-sided weakness for 2 days, progressively worsening. On Plavix. History of LEFT arm weakness, stroke, hypertension and hypercholesterolemia. EXAM: MRI HEAD WITHOUT AND WITH CONTRAST TECHNIQUE: Multiplanar, multiecho pulse sequences of the brain and surrounding structures were obtained without and with intravenous contrast. CONTRAST:  87mL MULTIHANCE GADOBENATE DIMEGLUMINE 529 MG/ML IV SOLN COMPARISON:  CT HEAD February 27, 2018 and MRI of the head July 15, 2016 FINDINGS: INTRACRANIAL CONTENTS: 4 mm reduced diffusion RIGHT inferior cerebellum, not well characterized on ADC map, no enhancement. Associated T2 hyperintense signal. Old tiny LEFT inferior cerebellar infarct new from prior MRI. Old cystic RIGHT basal ganglia infarct with minimal surrounding encephalomalacia. Subcentimeter linear T2 hyperintensity LEFT thalamus seen with old lacunar infarct, less likely perivascular space. A few scattered supratentorial white matter FLAIR T2 hyperintensities compatible with mild chronic small vessel ischemic disease. No midline shift, mass effect or masses. No abnormal intraparenchymal or extra-axial enhancement. VASCULAR: Normal major intracranial vascular flow voids present at skull base. SKULL AND UPPER CERVICAL SPINE: No abnormal sellar expansion. No suspicious calvarial bone marrow signal. Craniocervical junction maintained. SINUSES/ORBITS: The mastoid air-cells and included paranasal sinuses are well-aerated.The included ocular globes and orbital contents are non-suspicious. OTHER: None. IMPRESSION: 1. Acute 4 mm RIGHT inferior cerebellar infarct. New nonacute subcentimeter LEFT inferior  cerebellar infarct. 2. Old RIGHT basal ganglia infarct. Old LEFT thalamus lacunar infarct versus perivascular space. Electronically Signed   By: Elon Alas M.D.   On: 02/27/2018 20:12     EKG: Independently reviewed.  Sinus rhythm, QTC 448, no ischemic change.  Assessment/Plan Principal Problem:   Stroke [facility]) Active Problems:   HLD (hyperlipidemia)   GERD (gastroesophageal reflux disease)   Essential hypertension   Stroke [facility]): MRI showed acute 4 mm RIGHT inferior cerebellar infarct and new nonacute subcentimeter LEFT inferior cerebellar infarct. Dr. Rory Percy of neuro was consulted.  - will place on tele bed for obs - Highly appreciated neurologist's consultation,will follow up recommendations as follows:   Recommendations:  -Telemetry monitoring  -Allow for permissive hypertension for the first 24-48h - only treat PRN if SBP >220 mmHg. Blood pressures can be gradually normalized to SBP<140 upon discharge.  -CT Angiogram of Head and neck  -  Echocardiogram  -HgbA1c, fasting lipid panel  -Frequent neuro checks  -Prophylactic therapy-Antiplatelet med:  Plavix 75 mg PO daily for now. Decision on change or DAPT   -allergic to statin (superficial blood clots with Lipitor and Crestor)- c/w Zetia  -Risk factor modification  -PT consult, OT consult, Speech consult  HLD: -Zetia  HTN:  -hold home medications-amlodipine to allow permissive hypertension -IV hydralazine prn for SBP>220  GERD: -Protonix  DVT ppx: sQ Lovenox Code Status: Full code Family Communication: Yes, patient's wife at bed side Disposition Plan:  Anticipate discharge back to previous home environment Consults called:  Dr.  Rory Percy of neuro Admission status: Obs / tele    Date of Service 02/27/2018    Ivor Costa [facility] Pager [phone]  If 7PM-7AM, please contact night-coverage [email or web address] Password [facility] 02/27/2018, 10:54 PM

## 2018-02-27 NOTE — Consult Note (Signed)
Neurology Consultation  Reason for Consult: Stroke Referring Physician: Dr. Dr. Vallery Ridge  CC: Left arm weakness  History is obtained from: Patient, wife, chart  HPI: Jason Oconnor is a 62 y.o. 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  The symptoms were mild to begin with but then persisted.  He also complained of some dizziness as in room spinning that started this morning.  He came to the emergency room for further evaluation since he has a history of stroke. Denies any preceding illnesses.  Denies fevers chills.  Denies visual symptoms.  Reports diffuse head pressure but no headache.  Reports the head pressure to be mild.  Denies chest pain shortness of breath. At baseline, he is able to ambulate around the block without a cane.  He has been seeing physical medicine and rehabilitation since his last stroke and was discharged from the practice roughly about 6 months ago.  He has tried multiple antispastic medications but they have not really worked well for him.  He had had a conversation about Botox with his physical medicine doctor but never received Botox injections on the left.  He has a foot drop for which he uses a brace.   LKW: 9 AM on 02/25/2018 tpa given?: no, outside the window Premorbid modified Rankin scale (mRS):2   ROS: ROS was performed and is negative except as noted in the HPI.   Past Medical History:  Diagnosis Date  . Bone marrow disease   . High cholesterol   . Hypertension   . Stroke [facility])    No family history on file.   Social History:   reports that he has quit smoking. He has never used smokeless tobacco. He reports that he drinks alcohol. He reports that he does not use drugs.  Medications No current  facility-administered medications for this encounter.   Current Outpatient Medications:  .  acetaminophen (TYLENOL) 325 MG tablet, Take 2 tablets (650 mg total) by mouth every 6 (six) hours as needed for mild pain (or Fever >/= 101)., Disp: , Rfl:  .  amLODipine (NORVASC) 5 MG tablet, Take 1 tablet (5 mg total) by mouth every evening., Disp: 30 tablet, Rfl: 0 .  Cholecalciferol (VITAMIN D3) 1000 units CAPS, Take 1,000 Units by mouth every morning., Disp: , Rfl:  .  clopidogrel (PLAVIX) 75 MG tablet, Take 1 tablet (75 mg total) by mouth daily., Disp: 30 tablet, Rfl: 0 .  ezetimibe (ZETIA) 10 MG tablet, Take 10 mg by mouth daily., Disp: , Rfl:  .  pantoprazole (PROTONIX) 40 MG tablet, Take 1 tablet (40 mg total) by mouth daily., Disp: 30 tablet, Rfl: 0   Exam: Current vital signs: BP 92/74   Pulse 72   Temp 98.3 F (36.8 C)   Resp 16   SpO2 99%  Vital signs in last 24 hours: Temp:  [98.3 F (36.8 C)-98.5 F (36.9 C)] 98.3 F (36.8 C) (04/05 1845) Pulse Rate:  [64-81] 72 (04/05 2130) Resp:  [14-18] 16 (04/05 2130) BP: (92-170)/(74-100) 92/74 (04/05 2130) SpO2:  [98 %-100 %] 99 % (04/05 2130)  GENERAL: Awake, alert in NAD HEENT: - Normocephalic and atraumatic, dry mm, no LN++, no Thyromegally LUNGS - Clear to auscultation bilaterally with no wheezes CV - S1S2 RRR, no m/r/g,  equal pulses bilaterally. ABDOMEN - Soft, nontender, nondistended with normoactive BS Ext: warm, well perfused, intact peripheral pulses, no edema   NEURO:  Mental Status: AA&Ox3  Language: speech is clear.  Naming, repetition, fluency, and comprehension intact. Cranial Nerves: PERRL. EOMI, visual fields full, no facial asymmetry,facial sensation intact, hearing intact, tongue/uvula/soft palate midline, normal sternocleidomastoid and trapezius muscle strength. No evidence of tongue atrophy or fibrillations Motor: Right upper and lower extremity 5/5 with normal tone normal range of motion. Left upper extremity  5/5 with increased tone and some fasciculations noted.  Left lower extremity with approximately 5/5 and distal 3/5 dorsiflexion 4/5 plantarflexion and increased tone in fasciculations. Sensation- Intact to light touch bilaterally Coordination: FTN intact bilaterally, no ataxia in BLE. Gait- deferred Deep tendon reflexes- 2+ on the right, 3+ left upper extremity, positive Hoffmann's, ankle clonus present on the left  NIHSS - 0  Labs I have reviewed labs in epic and the results pertinent to this consultation are:   CBC    Component Value Date/Time   WBC 6.5 02/27/2018 1319   RBC 4.84 02/27/2018 1319   HGB 15.0 02/27/2018 1335   HCT 44.0 02/27/2018 1335   PLT 262 02/27/2018 1319   MCV 88.8 02/27/2018 1319   MCH 29.8 02/27/2018 1319   MCHC 33.5 02/27/2018 1319   RDW 12.9 02/27/2018 1319   LYMPHSABS 2.2 02/27/2018 1319   MONOABS 0.4 02/27/2018 1319   EOSABS 0.2 02/27/2018 1319   BASOSABS 0.0 02/27/2018 1319    CMP     Component Value Date/Time   NA 140 02/27/2018 1335   K 4.7 02/27/2018 1335   CL 104 02/27/2018 1335   CO2 23 02/27/2018 1319   GLUCOSE 100 (H) 02/27/2018 1335   BUN 19 02/27/2018 1335   CREATININE 1.10 02/27/2018 1335   CALCIUM 9.8 02/27/2018 1319   PROT 7.9 02/27/2018 1319   ALBUMIN 4.5 02/27/2018 1319   AST 24 02/27/2018 1319   ALT 20 02/27/2018 1319   ALKPHOS 45 02/27/2018 1319   BILITOT 0.6 02/27/2018 1319   GFRNONAA >60 02/27/2018 1319   GFRAA >60 02/27/2018 1319    Imaging I have reviewed the images obtained: MRI examination of the brain-small area of vision in the right inferior cerebellar region.  Also noted is a new nonacute subcentimeter infarct in the left cerebellum.  Old right basal ganglia and left thalamic lacunar infarcts seen. Noncontrast CT of the head did not show any bleed or acute findings.  Assessment:  62 year old man with a past medical history of hypertension, hypercholesterolemia, right basal ganglia stroke with residual left  spastic hemiparesis presenting for evaluation of left upper extremity weakness. MRI of the brain shows right cerebellar stroke.  This does not explain his clinical presentation.  He did have some dizziness, the MRI findings can explain the dizziness. His left upper extremity weakness could be regresses of his old symptoms or worsening of his spasticity. He does need workup for stroke because of the new acute right cerebellar stroke and also a nonacute left cerebellar stroke which is new since the last exam. Another possibility to consider because of the multiple circulations in which she has had stroke is to consider cardiac source.  Impression: Right cerebellar stroke-likely small vessel versus atheroembolic Hypertension Hyperlipidemia  Recommendations: -Admit to hospitalist -Telemetry monitoring -Allow for permissive hypertension for the first 24-48h - only treat PRN if SBP >220 mmHg. Blood pressures can be gradually normalized to SBP<140 upon discharge. -CT Angiogram of Head and neck -Echocardiogram -  HgbA1c, fasting lipid panel -Frequent neuro checks -Prophylactic therapy-Antiplatelet med:  Plavix 75 mg PO daily for now. Decision on change or DAPT  -allergic to statin (superficial blood clots with Lipitor and Crestor)- c/w Zetia -Risk factor modification -PT consult, OT consult, Speech consult  Please page stroke NP/PA/MD (listed on AMION)  from 8am-4 pm as this patient will be followed by the stroke team at this point.  -- Amie Portland, MD [facility] Pager: [phone] If 7pm to 7am, please call on call as listed on AMION.

## 2018-02-28 ENCOUNTER — Encounter (HOSPITAL_COMMUNITY): Payer: Self-pay

## 2018-02-28 ENCOUNTER — Observation Stay (HOSPITAL_BASED_OUTPATIENT_CLINIC_OR_DEPARTMENT_OTHER): Payer: 59

## 2018-02-28 DIAGNOSIS — I639 Cerebral infarction, unspecified: Secondary | ICD-10-CM | POA: Diagnosis not present

## 2018-02-28 DIAGNOSIS — I5030 Unspecified diastolic (congestive) heart failure: Secondary | ICD-10-CM | POA: Diagnosis not present

## 2018-02-28 DIAGNOSIS — I6300 Cerebral infarction due to thrombosis of unspecified precerebral artery: Secondary | ICD-10-CM | POA: Diagnosis not present

## 2018-02-28 DIAGNOSIS — K219 Gastro-esophageal reflux disease without esophagitis: Secondary | ICD-10-CM | POA: Diagnosis not present

## 2018-02-28 DIAGNOSIS — I10 Essential (primary) hypertension: Secondary | ICD-10-CM | POA: Diagnosis not present

## 2018-02-28 DIAGNOSIS — E782 Mixed hyperlipidemia: Secondary | ICD-10-CM

## 2018-02-28 LAB — LIPID PANEL
CHOL/HDL RATIO: 4.7 ratio
CHOLESTEROL: 174 mg/dL (ref 0–200)
HDL: 37 mg/dL — AB (ref >40)
LDL Cholesterol: 89 mg/dL (ref 0–99)
TRIGLYCERIDES: 239 mg/dL — AB (ref <150)
VLDL: 48 mg/dL — AB (ref 0–40)

## 2018-02-28 LAB — HEMOGLOBIN A1C
Hgb A1c MFr Bld: 5.4 % (ref 4.8–5.6)
MEAN PLASMA GLUCOSE: 108.28 mg/dL

## 2018-02-28 LAB — ECHOCARDIOGRAM COMPLETE
Height: 73 in
Weight: 3432.12 oz

## 2018-02-28 LAB — HIV ANTIBODY (ROUTINE TESTING W REFLEX): HIV Screen 4th Generation wRfx: NONREACTIVE

## 2018-02-28 MED ORDER — ASPIRIN 81 MG PO TBEC: 81.0000 mg | DELAYED_RELEASE_TABLET | Freq: Every day | ORAL | 0 refills | Status: AC

## 2018-02-28 MED ORDER — ASPIRIN EC 81 MG PO TBEC
81.0000 mg | DELAYED_RELEASE_TABLET | Freq: Every day | ORAL | Status: DC
  Administered 2018-02-28: 81 mg via ORAL
  Filled 2018-02-28: qty 1

## 2018-02-28 MED ORDER — SIMVASTATIN 20 MG PO TABS
20.0000 mg | ORAL_TABLET | Freq: Every day | ORAL | Status: DC
  Administered 2018-02-28: 20 mg via ORAL
  Filled 2018-02-28: qty 1

## 2018-02-28 MED ORDER — SIMVASTATIN 20 MG PO TABS: 20.0000 mg | ORAL_TABLET | Freq: Every day | ORAL | 0 refills | Status: AC

## 2018-02-28 NOTE — Discharge Instructions (Signed)
Cholesterol Cholesterol is a fat. Your body needs a small amount of cholesterol. Cholesterol (plaque) may build up in your blood vessels (arteries). That makes you more likely to have a heart attack or stroke. You cannot feel your cholesterol level. Having a blood test is the only way to find out if your level is high. Keep your test results. Work with your doctor to keep your cholesterol at a good level. What do the results mean?  Total cholesterol is how much cholesterol is in your blood.  LDL is bad cholesterol. This is the type that can build up. Try to have low LDL.  HDL is good cholesterol. It cleans your blood vessels and carries LDL away. Try to have high HDL.  Triglycerides are fat that the body can store or burn for energy. What are good levels of cholesterol?  Total cholesterol below 200.  LDL below 100 is good for people who have health risks. LDL below 70 is good for people who have very high risks.  HDL above 40 is good. It is best to have HDL of 60 or higher.  Triglycerides below 150. How can I lower my cholesterol? Diet Follow your diet program as told by your doctor.  Choose fish, white meat chicken, or turkey that is roasted or baked. Try not to eat red meat, fried foods, sausage, or lunch meats.  Eat lots of fresh fruits and vegetables.  Choose whole grains, beans, pasta, potatoes, and cereals.  Choose olive oil, corn oil, or canola oil. Only use small amounts.  Try not to eat butter, mayonnaise, shortening, or palm kernel oils.  Try not to eat foods with trans fats.  Choose low-fat or nonfat dairy foods. ? Drink skim or nonfat milk. ? Eat low-fat or nonfat yogurt and cheeses. ? Try not to drink whole milk or cream. ? Try not to eat ice cream, egg yolks, or full-fat cheeses.  Healthy desserts include angel food cake, ginger snaps, animal crackers, hard candy, popsicles, and low-fat or nonfat frozen yogurt. Try not to eat pastries, cakes, pies, and  cookies.  Exercise Follow your exercise program as told by your doctor.  Be more active. Try gardening, walking, and taking the stairs.  Ask your doctor about ways that you can be more active.  Medicine  Take over-the-counter and prescription medicines only as told by your doctor. This information is not intended to replace advice given to you by your health care provider. Make sure you discuss any questions you have with your health care provider. Document Released: 02/07/2009 Document Revised: 06/12/2016 Document Reviewed: 05/23/2016 Elsevier Interactive Patient Education  2018 [employer]  

## 2018-02-28 NOTE — Progress Notes (Signed)
  Echocardiogram 2D Echocardiogram has been performed.  Jason Oconnor G Jason Oconnor 02/28/2018, 2:58 PM

## 2018-02-28 NOTE — Evaluation (Signed)
Occupational Therapy Evaluation Patient Details Name: Jason Oconnor MRN: [id number] DOB: 1956/01/05 Today's Date: 02/28/2018    History of Present Illness 62 yo male who has history beginning Nov 2017 of strokes affecting lacunar thalamus and basal ganglia, now has acute change to R inferior cerebellum.  Has L hemiparesis mainly affecting DF.  Dizzy, chronic tone to L LE and weakness.  Has AFO he doesn't use.  PMHx:  Stroke, HTN, bone marrow disease   Clinical Impression   Pt admitted with the above diagnoses and presents with below problem list. Pt will benefit from continued acute OT to address the below listed deficits and maximize independence with basic ADLs prior to d/c home. PTA pt was independent with ADLs. Pt presents with mild dysmetria in LUE, strength and FMC appear to be [facility]. Pt is currently setup to min guard with LB ADLs and functional mobility.       Follow Up Recommendations  Outpatient OT    Equipment Recommendations  None recommended by OT    Recommendations for Other Services       Precautions / Restrictions Precautions Precautions: Fall Restrictions Weight Bearing Restrictions: No      Mobility Bed Mobility Overal bed mobility: Modified Independent                Transfers Overall transfer level: Modified independent               General transfer comment: uses hands sporadically to control transfers    Balance Overall balance assessment: Needs assistance Sitting-balance support: Feet supported Sitting balance-Leahy Scale: Good     Standing balance support: No upper extremity supported;Single extremity supported Standing balance-Leahy Scale: Fair Standing balance comment: pt has signs of imbalance with dynamic standing and needs hand placement                           ADL either performed or assessed with clinical judgement   ADL Overall ADL's : Needs assistance/impaired Eating/Feeding: Set up;Sitting   Grooming: Minimal  assistance;Min guard;Standing   Upper Body Bathing: Set up;Sitting   Lower Body Bathing: Min guard;Sit to/from stand   Upper Body Dressing : Set up;Sitting   Lower Body Dressing: Min guard;Sit to/from stand   Toilet Transfer: Min guard;Ambulation   Toileting- Clothing Manipulation and Hygiene: Min guard;Sit to/from stand   Tub/ Shower Transfer: Walk-in shower;Min guard;Ambulation     General ADL Comments: Pt completed bed mobility and took a couple of steps away from bed. Session shortened due to arrival of Echo.      Vision         Perception     Praxis      Pertinent Vitals/Pain Pain Assessment: No/denies pain     Hand Dominance Right   Extremity/Trunk Assessment Upper Extremity Assessment Upper Extremity Assessment: LUE deficits/detail LUE Deficits / Details: Dysmetria noted which pt reports is not baseline. baseline shoulder pain since previous stroke. Able to open and close twist top containers of varying sizes. Gross strength WFL, pt reports no perceived change. LUE Sensation: history of peripheral neuropathy   Lower Extremity Assessment Lower Extremity Assessment: Defer to PT evaluation LLE Deficits / Details: L ankle DF is 3+ LLE Coordination: decreased gross motor   Cervical / Trunk Assessment Cervical / Trunk Assessment: Normal   Communication Communication Communication: No difficulties   Cognition Arousal/Alertness: Awake/alert Behavior During Therapy: WFL for tasks assessed/performed Overall Cognitive Status: Within Functional Limits for tasks assessed  General Comments       Exercises Exercises: Other exercises(LE strength 4+ to 5 x DF L is 3+) Other Exercises Other Exercises: ROM to B ankles and hips   Shoulder Instructions      Home Living Family/patient expects to be discharged to:: Private residence Living Arrangements: Spouse/significant other Available Help at Discharge:  Family;Available 24 hours/day Type of Home: House Home Access: Stairs to enter [employer] of Steps: 2 Entrance Stairs-Rails: Right Home Layout: One level     Bathroom Shower/Tub: Occupational psychologist: Standard     Home Equipment: Environmental consultant - 2 wheels;Cane - single point;Shower seat   Additional Comments: wife reports pt set AFO and cane aside and does not use either'      Prior Functioning/Environment Level of Independence: Independent        Comments: has been out doing minimal walking or exercise        OT Problem List: Impaired balance (sitting and/or standing);Decreased knowledge of use of DME or AE;Decreased knowledge of precautions;Impaired UE functional use      OT Treatment/Interventions: Self-care/ADL training;Therapeutic exercise;Neuromuscular education;DME and/or AE instruction;Therapeutic activities;Patient/family education;Balance training    OT Goals(Current goals can be found in the care plan section) Acute Rehab OT Goals Patient Stated Goal: to get stronger and increase tolerance for LLE activity OT Goal Formulation: With patient/family Time For Goal Achievement: 03/07/18 Potential to Achieve Goals: Good ADL Goals Pt Will Perform Grooming: Independently Pt Will Perform Lower Body Dressing: with modified independence;sit to/from stand Pt Will Perform Tub/Shower Transfer: Shower transfer;with modified independence Pt/caregiver will Perform Home Exercise Program: Left upper extremity;With written HEP provided  OT Frequency: Min 2X/week   Barriers to D/C:            Co-evaluation              AM-PAC PT "6 Clicks" Daily Activity     Outcome Measure Help from another person eating meals?: None Help from another person taking care of personal grooming?: A Little Help from another person toileting, which includes using toliet, bedpan, or urinal?: None Help from another person bathing (including washing, rinsing, drying)?: A  Little Help from another person to put on and taking off regular upper body clothing?: None Help from another person to put on and taking off regular lower body clothing?: A Little 6 Click Score: 21   End of Session    Activity Tolerance: Patient tolerated treatment well Patient left: in bed;with call bell/phone within reach;with family/visitor present;Other (comment)(Echo)  OT Visit Diagnosis: Unsteadiness on feet (R26.81);Ataxia, unspecified (R27.0)                Time: 1245-8099 OT Time Calculation (min): 9 min Charges:  OT General Charges $OT Visit: 1 Visit G-Codes:     {  Hortencia Pilar 02/28/2018, 2:17 PM

## 2018-02-28 NOTE — Progress Notes (Signed)
Pt and family educated and discharge information given. Discharge summary reviewed and signed by patient. No new questions or concerns. IV and telemetry removed. Patient discharged from unit per wheelchair by staff, spouse to transport home

## 2018-02-28 NOTE — Discharge Summary (Signed)
Physician Discharge Summary  Jason Oconnor ELF:[id number] DOB: [birth year] DOA: 02/27/2018  PCP: Andi Devon, MD  Admit date: 02/27/2018 Discharge date: 02/28/2018  Admitted From: Home Disposition: Home   Recommendations for Outpatient Follow-up:  1. Follow up with PCP in 1-2 weeks 2. Follow up with neurology in 6 weeks 3. Outpatient PT and OT.  Home Health: None Equipment/Devices: None Discharge Condition: Stable CODE STATUS: Full Diet recommendation: Heart healthy  Brief/Interim Summary: Jason Oconnor is a 62 y.o. male with medical history significant of Stroke with mild left-sided weakness, hypertension, hyperlipidemia, GERD, bone marrow disease who presented with dizziness, and worsening left-sided weakness.  Patient states that he has chronic mild left-sided weakness and mild numbness from previous stroke. In the past 2 days, he has been feeling worsening weakness in left arm and leg. No vision change, hearing loss. No slurred speech, facial droop. He also has dizziness. Patient denies chest pain, shortness of breath, cough, fever or chills. No nausea, vomiting, diarrhea, abdominal pain, symptoms of UTI. He has a foot drop for which he uses a brace.  ED Course: Pt was found to have WBC 6.5, INR 1.02, negative troponin, creatinine 1.16, GFR>60, temperature normal, no tachycardia, no tachypnea, oxygen saturation section 99% on room air. CT head is negative for acute issues, but showed remote infarction. Pt is placed on tele bed for obs. Neuro, dr. Rory Percy was consulted.  MRI-brain showed  1. Acute 4 mm RIGHT inferior cerebellar infarct. New nonacute subcentimeter LEFT inferior cerebellar infarct. 2. Old RIGHT basal ganglia infarct. Old LEFT thalamus lacunar infarct versus perivascular space.  Hospital course: CTA showed no stenosis or large vessel occlusion. A1c 5.4%, LDL 89. Simvastatin started (pt previously only on zetia due to "history of blood clots" with lipitor, though this is  not a known effect of lipitor) after discussion with patient. Echocardiogram showed no cardioembolic source and no AFib was noted on telemetry. PT and OT recommended outpatient therapy as the patient's acute symptoms had significantly improved, leaving only chronic left hemisensory/hemiparesis. Neurology recommended DAPT x3 weeks and switch to either ASA 325mg  or plavix 75mg  daily alone.     Discharge Diagnoses:  Principal Problem:   Stroke [facility]) Active Problems:   HLD (hyperlipidemia)   GERD (gastroesophageal reflux disease)   Essential hypertension  Lacunar infarct: With resultant dizziness which has resolved. Small vessel disease. MRI head - Acute 4 mm RIGHT inferior cerebellar infarct. New nonacute subcentimeter LEFT inferior cerebellar infarct. Old RIGHT basal ganglia infarct. Old LEFT thalamus lacunar infarct versus perivascular space. CTA unremarkable. Echo without cardioembolic source.   - Hyperlipidemia: Continue zetia, add simvastatin cautiously to get LDL <70. If unable to tolerate, would refer for PCSK9 inhibitor.   Discharge Instructions Discharge Instructions    Ambulatory referral to Neurology   Complete by:  As directed    An appointment is requested in approximately: 6 weeks   Ambulatory referral to Occupational Therapy   Complete by:  As directed    Ambulatory referral to Physical Therapy   Complete by:  As directed    Diet - low sodium heart healthy   Complete by:  As directed    Discharge instructions   Complete by:  As directed    You were admitted for a stroke and are now stable for discharge with the following recommendations:  - Continue plavix and start taking aspirin 81mg  daily. After 3 weeks you may either continue plavix alone or start aspirin 325mg  alone (not both).  - Follow up with  your PCP in the next 1-2 weeks.  - You should start taking simvastatin to further lower your risk of stroke. This was sent to your pharmacy. It is felt to be safe to use with  your history of a reaction to lipitor but stop taking this and report side effects to your PCP if you notice any trouble breathing, chest pain, rash, muscle aches.  - Follow up with neurology in 6 weeks. You will be contacted, but if you are not called in the next week, contact Dr. Clydene Fake office at the number provided.  - If your symptoms return/worsen, seek medical attention right away.   Increase activity slowly   Complete by:  As directed      Allergies as of 02/28/2018      Reactions   Lipitor [atorvastatin] Other (See Comments)   Caused blood clots   Baclofen Other (See Comments)   "Muscles relaxed too much on left side"   Lisinopril Other (See Comments)   Dizziness   Losartan Other (See Comments)   Dizziness      Medication List    TAKE these medications   acetaminophen 325 MG tablet Commonly known as:  TYLENOL Take 2 tablets (650 mg total) by mouth every 6 (six) hours as needed for mild pain (or Fever >/= 101).   amLODipine 5 MG tablet Commonly known as:  NORVASC Take 1 tablet (5 mg total) by mouth every evening.   aspirin 81 MG EC tablet Take 1 tablet (81 mg total) by mouth daily.   clopidogrel 75 MG tablet Commonly known as:  PLAVIX Take 1 tablet (75 mg total) by mouth daily.   ezetimibe 10 MG tablet Commonly known as:  ZETIA Take 10 mg by mouth daily.   simvastatin 20 MG tablet Commonly known as:  ZOCOR Take 1 tablet (20 mg total) by mouth daily at 6 PM.   Vitamin D3 1000 units Caps Take 1,000 Units by mouth every morning.      Follow-up Information    Andi Devon, MD Follow up.   Specialty:  Internal Medicine Contact information: [place] [address] [phone]        Garvin Fila, MD Follow up.   Specialties:  Neurology, Radiology Contact information: [place] [address] [place] Follow up.   Why:  Please call if you don't hear from  the physical therapist or occupational therapist. Contact information: [place] [address]         Allergies  Allergen Reactions  . Lipitor [Atorvastatin] Other (See Comments)    Caused blood clots  . Baclofen Other (See Comments)    "Muscles relaxed too much on left side"  . Lisinopril Other (See Comments)    Dizziness  . Losartan Other (See Comments)    Dizziness    Consultations:  Neurology  Procedures/Studies: Ct Angio Head W Or Wo Contrast  Result Date: 02/27/2018 CLINICAL DATA:  62 y/o M; dizziness and left-sided weakness with onset 2 days ago, progressively worsening. EXAM: CT ANGIOGRAPHY HEAD AND NECK TECHNIQUE: Multidetector CT imaging of the head and neck was performed using the standard protocol during bolus administration of intravenous contrast. Multiplanar CT image reconstructions and MIPs were obtained to evaluate the vascular anatomy. Carotid stenosis measurements (when applicable) are obtained utilizing NASCET criteria, using the distal internal carotid diameter as the denominator. CONTRAST:  87mL ISOVUE-370 IOPAMIDOL (  ISOVUE-370) INJECTION 76% COMPARISON:  02/27/2018 CT head and MRI head. FINDINGS: CTA NECK FINDINGS Aortic arch: Standard branching. Imaged portion shows no evidence of aneurysm or dissection. No significant stenosis of the major arch vessel origins. Right carotid system: No evidence of dissection, stenosis (50% or greater) or occlusion. Left carotid system: No evidence of dissection, stenosis (50% or greater) or occlusion. Vertebral arteries: Codominant. No evidence of dissection, stenosis (50% or greater) or occlusion. Skeleton: No acute fracture identified. Moderate cervical spondylosis with multilevel disc and facet degenerative changes greatest at the C5-6 and C6-7 levels. Multifactorial mild C6-7 canal stenosis. Other neck: Negative. Upper chest: Negative. Review of the MIP images confirms the above findings CTA  HEAD FINDINGS Anterior circulation: No significant stenosis, proximal occlusion, aneurysm, or vascular malformation. Posterior circulation: No significant stenosis, proximal occlusion, aneurysm, or vascular malformation. Venous sinuses: As permitted by contrast timing, patent. Anatomic variants: Fetal right PCA and small anterior communicating artery. No left posterior communicating artery identified, likely hypoplastic or absent. Delayed phase: No abnormal intracranial enhancement. Review of the MIP images confirms the above findings IMPRESSION: 1. Patent carotid and vertebral arteries. No dissection, aneurysm, or hemodynamically significant stenosis utilizing NASCET criteria. 2. Patent anterior and posterior intracranial circulation. No large vessel occlusion, aneurysm, or significant stenosis. Electronically Signed   By: Kristine Garbe M.D.   On: 02/27/2018 23:17   Ct Head Wo Contrast  Result Date: 02/27/2018 CLINICAL DATA:  Left-sided weakness and dizziness for 2 days. EXAM: CT HEAD WITHOUT CONTRAST TECHNIQUE: Contiguous axial images were obtained from the base of the skull through the vertex without intravenous contrast. COMPARISON:  07/15/2016 FINDINGS: Brain: Remote perforator infarct affecting the right Corona radiata and posterior putamen. No evidence of acute infarct. No hemorrhage, hydrocephalus, or masslike finding. Vascular: No hyperdense vessel or unexpected calcification. Skull: Normal. Negative for fracture or focal lesion. Sinuses/Orbits: No acute finding. IMPRESSION: 1. No acute finding. 2. Remote perforator infarct affecting the right putamen and corona radiata. Electronically Signed   By: Monte Fantasia M.D.   On: 02/27/2018 13:50   Ct Angio Neck W Or Wo Contrast  Result Date: 02/27/2018 CLINICAL DATA:  62 y/o M; dizziness and left-sided weakness with onset 2 days ago, progressively worsening. EXAM: CT ANGIOGRAPHY HEAD AND NECK TECHNIQUE: Multidetector CT imaging of the head and  neck was performed using the standard protocol during bolus administration of intravenous contrast. Multiplanar CT image reconstructions and MIPs were obtained to evaluate the vascular anatomy. Carotid stenosis measurements (when applicable) are obtained utilizing NASCET criteria, using the distal internal carotid diameter as the denominator. CONTRAST:  61mL ISOVUE-370 IOPAMIDOL (ISOVUE-370) INJECTION 76% COMPARISON:  02/27/2018 CT head and MRI head. FINDINGS: CTA NECK FINDINGS Aortic arch: Standard branching. Imaged portion shows no evidence of aneurysm or dissection. No significant stenosis of the major arch vessel origins. Right carotid system: No evidence of dissection, stenosis (50% or greater) or occlusion. Left carotid system: No evidence of dissection, stenosis (50% or greater) or occlusion. Vertebral arteries: Codominant. No evidence of dissection, stenosis (50% or greater) or occlusion. Skeleton: No acute fracture identified. Moderate cervical spondylosis with multilevel disc and facet degenerative changes greatest at the C5-6 and C6-7 levels. Multifactorial mild C6-7 canal stenosis. Other neck: Negative. Upper chest: Negative. Review of the MIP images confirms the above findings CTA HEAD FINDINGS Anterior circulation: No significant stenosis, proximal occlusion, aneurysm, or vascular malformation. Posterior circulation: No significant stenosis, proximal occlusion, aneurysm, or vascular malformation. Venous sinuses: As permitted by contrast timing, patent. Anatomic variants:  Fetal right PCA and small anterior communicating artery. No left posterior communicating artery identified, likely hypoplastic or absent. Delayed phase: No abnormal intracranial enhancement. Review of the MIP images confirms the above findings IMPRESSION: 1. Patent carotid and vertebral arteries. No dissection, aneurysm, or hemodynamically significant stenosis utilizing NASCET criteria. 2. Patent anterior and posterior intracranial  circulation. No large vessel occlusion, aneurysm, or significant stenosis. Electronically Signed   By: Kristine Garbe M.D.   On: 02/27/2018 23:17   Mr Jeri Cos SA Contrast  Result Date: 02/27/2018 CLINICAL DATA:  Dizziness and LEFT-sided weakness for 2 days, progressively worsening. On Plavix. History of LEFT arm weakness, stroke, hypertension and hypercholesterolemia. EXAM: MRI HEAD WITHOUT AND WITH CONTRAST TECHNIQUE: Multiplanar, multiecho pulse sequences of the brain and surrounding structures were obtained without and with intravenous contrast. CONTRAST:  57mL MULTIHANCE GADOBENATE DIMEGLUMINE 529 MG/ML IV SOLN COMPARISON:  CT HEAD February 27, 2018 and MRI of the head July 15, 2016 FINDINGS: INTRACRANIAL CONTENTS: 4 mm reduced diffusion RIGHT inferior cerebellum, not well characterized on ADC map, no enhancement. Associated T2 hyperintense signal. Old tiny LEFT inferior cerebellar infarct new from prior MRI. Old cystic RIGHT basal ganglia infarct with minimal surrounding encephalomalacia. Subcentimeter linear T2 hyperintensity LEFT thalamus seen with old lacunar infarct, less likely perivascular space. A few scattered supratentorial white matter FLAIR T2 hyperintensities compatible with mild chronic small vessel ischemic disease. No midline shift, mass effect or masses. No abnormal intraparenchymal or extra-axial enhancement. VASCULAR: Normal major intracranial vascular flow voids present at skull base. SKULL AND UPPER CERVICAL SPINE: No abnormal sellar expansion. No suspicious calvarial bone marrow signal. Craniocervical junction maintained. SINUSES/ORBITS: The mastoid air-cells and included paranasal sinuses are well-aerated.The included ocular globes and orbital contents are non-suspicious. OTHER: None. IMPRESSION: 1. Acute 4 mm RIGHT inferior cerebellar infarct. New nonacute subcentimeter LEFT inferior cerebellar infarct. 2. Old RIGHT basal ganglia infarct. Old LEFT thalamus lacunar infarct  versus perivascular space. Electronically Signed   By: Elon Alas M.D.   On: 02/27/2018 20:12   Echocardiogram 02/28/2018 - Left ventricle: The cavity size was normal. Wall thickness was   normal. Systolic function was normal. The estimated ejection   fraction was in the range of 55% to 60%. Wall motion was normal;   there were no regional wall motion abnormalities. Doppler   parameters are consistent with abnormal left ventricular   relaxation (grade 1 diastolic dysfunction). - Ascending aorta: The ascending aorta was mildly dilated.  Impressions: - Normal LV systolic function; mild diastolic dysfunction; mildly   dilated ascending aorta.  Subjective: Wants to go home, says dizziness is resolved.   Discharge Exam: Vitals:   02/28/18 0829 02/28/18 1207  BP: (!) 147/100 138/86  Pulse: 63 76  Resp: 18 18  Temp: 97.8 F (36.6 C) 98.1 F (36.7 C)  SpO2: 97% 98%   General: Pt is alert, awake, not in acute distress Cardiovascular: RRR, S1/S2 +, no rubs, no gallops Respiratory: CTA bilaterally, no wheezing, no rhonchi Abdominal: Soft, NT, ND, bowel sounds + Neuro: Mild left hemiparesis, no CN deficits or nystagmus.   Labs: Basic Metabolic Panel: Recent Labs  Lab 02/27/18 1319 02/27/18 1335  NA 138 140  K 4.8 4.7  CL 105 104  CO2 23  --   GLUCOSE 106* 100*  BUN 17 19  CREATININE 1.16 1.10  CALCIUM 9.8  --    Liver Function Tests: Recent Labs  Lab 02/27/18 1319  AST 24  ALT 20  ALKPHOS 45  BILITOT 0.6  PROT 7.9  ALBUMIN 4.5   No results for input(s): LIPASE, AMYLASE in the last 168 hours. No results for input(s): AMMONIA in the last 168 hours. CBC: Recent Labs  Lab 02/27/18 1319 02/27/18 1335  WBC 6.5  --   NEUTROABS 3.7  --   HGB 14.4 15.0  HCT 43.0 44.0  MCV 88.8  --   PLT 262  --    Cardiac Enzymes: No results for input(s): CKTOTAL, CKMB, CKMBINDEX, TROPONINI in the last 168 hours. BNP: Invalid input(s): POCBNP CBG: Recent Labs  Lab  02/27/18 1852  GLUCAP 88   D-Dimer No results for input(s): DDIMER in the last 72 hours. Hgb A1c Recent Labs    02/28/18 0412  HGBA1C 5.4   Lipid Profile Recent Labs    02/28/18 0412  CHOL 174  HDL 37*  LDLCALC 89  TRIG 239*  CHOLHDL 4.7   Thyroid function studies No results for input(s): TSH, T4TOTAL, T3FREE, THYROIDAB in the last 72 hours.  Invalid input(s): FREET3 Anemia work up No results for input(s): VITAMINB12, FOLATE, FERRITIN, TIBC, IRON, RETICCTPCT in the last 72 hours. Urinalysis    Component Value Date/Time   COLORURINE STRAW (A) 07/14/2016 1220   APPEARANCEUR CLEAR (A) 07/14/2016 1220   LABSPEC 1.009 07/14/2016 1220   PHURINE 6.0 07/14/2016 1220   GLUCOSEU NEGATIVE 07/14/2016 1220   HGBUR NEGATIVE 07/14/2016 1220   BILIRUBINUR NEGATIVE 07/14/2016 1220   KETONESUR NEGATIVE 07/14/2016 1220   PROTEINUR NEGATIVE 07/14/2016 1220   NITRITE NEGATIVE 07/14/2016 [place] 07/14/2016 1220    Microbiology No results found for this or any previous visit (from the past 240 hour(s)).  Time coordinating discharge: Approximately 40 minutes  Vance Gather, MD  [facility] 02/28/2018, 5:45 PM Pager [phone]

## 2018-02-28 NOTE — Progress Notes (Signed)
Pt arrived from the ED. Wife at bedside. Oriented to room. Call bell in reach. Bed locked. VSS. NIH 0. Stroke paper started. Tele placed.

## 2018-02-28 NOTE — Evaluation (Signed)
Physical Therapy Evaluation Patient Details Name: Jason Oconnor MRN: [id number] DOB: 1956-07-22 Today's Date: 02/28/2018   History of Present Illness  62 yo male who has history beginning Nov 2017 of strokes affecting lacunar thalamus and basal ganglia, now has acute change to R inferior cerebellum.  Has L hemiparesis mainly affecting DF.  Dizzy, chronic tone to L LE and weakness.  Has AFO he doesn't use.  PMHx:  Stroke, HTN, bone marrow disease  Clinical Impression  Pt was assisted to stretch ankles and hips to increase ROM then talked about recovery of stroke and the importance of HEP and possibly remaking AFO which is not working for pt's state of recovery.  Pt has a drop foot on LLE but is able to clear from the floor with thoughtful work.  He is going to be referred to outpatient therapy to increase his strength generally,continue to work on ROM and to progress his WB on LLE to more recovered state.  Follow acutely for strengthening and and gait as needed.    Follow Up Recommendations Outpatient PT    Equipment Recommendations  Other (comment)(referral to redo brace for L ankle weakness)    Recommendations for Other Services       Precautions / Restrictions Precautions Precautions: Fall Restrictions Weight Bearing Restrictions: No      Mobility  Bed Mobility Overal bed mobility: Modified Independent                Transfers Overall transfer level: Modified independent               General transfer comment: uses hands sporadically to control transfers  Ambulation/Gait Ambulation/Gait assistance: Supervision Ambulation Distance (Feet): 60 Feet Assistive device: None   Gait velocity: reduced Gait velocity interpretation: Below normal speed for age/gender General Gait Details: pt walks with minimal wb through LLE by controlling standing on RLE.  Has decreased ability to walk heel to toe on LLE due to decreased ROM and strength L ankle  Stairs             Wheelchair Mobility    Modified Rankin (Stroke Patients Only)       Balance Overall balance assessment: Needs assistance Sitting-balance support: Feet supported Sitting balance-Leahy Scale: Good     Standing balance support: No upper extremity supported;Single extremity supported Standing balance-Leahy Scale: Fair Standing balance comment: pt has signs of imbalance with dynamic standing and needs hand placement                             Pertinent Vitals/Pain Pain Assessment: No/denies pain    Home Living Family/patient expects to be discharged to:: Private residence Living Arrangements: Spouse/significant other Available Help at Discharge: Family;Available 24 hours/day Type of Home: House Home Access: Stairs to enter Entrance Stairs-Rails: Right Entrance Stairs-Number of Steps: 2 Home Layout: One level Home Equipment: Walker - 2 wheels;Cane - single point(using SPC for gait) Additional Comments: wife reports pt set AFO and cane aside and does not use either'    Prior Function Level of Independence: Independent         Comments: has been out doing minimal walking or exercise     Hand Dominance   Dominant Hand: Right    Extremity/Trunk Assessment   Upper Extremity Assessment Upper Extremity Assessment: Overall WFL for tasks assessed    Lower Extremity Assessment Lower Extremity Assessment: Overall WFL for tasks assessed;LLE deficits/detail LLE Deficits / Details: L ankle DF is  3+ LLE Coordination: decreased gross motor    Cervical / Trunk Assessment Cervical / Trunk Assessment: Normal  Communication   Communication: No difficulties  Cognition Arousal/Alertness: Awake/alert Behavior During Therapy: WFL for tasks assessed/performed Overall Cognitive Status: Within Functional Limits for tasks assessed                                        General Comments      Exercises Other Exercises Other Exercises: ROM to B  ankles and hips   Assessment/Plan    PT Assessment Patient needs continued PT services  PT Problem List Decreased strength;Decreased range of motion;Decreased activity tolerance;Decreased balance;Decreased mobility;Decreased cognition;Decreased coordination;Decreased knowledge of use of DME;Decreased safety awareness;Decreased skin integrity       PT Treatment Interventions DME instruction;Gait training;Stair training;Functional mobility training;Therapeutic activities;Therapeutic exercise;Neuromuscular re-education;Balance training;Cognitive remediation;Patient/family education    PT Goals (Current goals can be found in the Care Plan section)  Acute Rehab PT Goals Patient Stated Goal: to get stronger and increase tolerance for LLE activity PT Goal Formulation: With patient/family Time For Goal Achievement: 03/14/18 Potential to Achieve Goals: Good    Frequency Min 2X/week   Barriers to discharge Inaccessible home environment has 2 steps with R rail to enter house    Co-evaluation               AM-PAC PT "6 Clicks" Daily Activity  Outcome Measure Difficulty turning over in bed (including adjusting bedclothes, sheets and blankets)?: A Little Difficulty moving from lying on back to sitting on the side of the bed? : A Little Difficulty sitting down on and standing up from a chair with arms (e.g., wheelchair, bedside commode, etc,.)?: A Little Help needed moving to and from a bed to chair (including a wheelchair)?: A Little Help needed walking in hospital room?: A Little Help needed climbing 3-5 steps with a railing? : A Little 6 Click Score: 18    End of Session   Activity Tolerance: Patient tolerated treatment well;Other (comment)(LLE strength and tone in LE which is driven by posture) Patient left: in bed;with call bell/phone within reach;with family/visitor present Nurse Communication: Mobility status PT Visit Diagnosis: History of falling (Z91.81);Muscle weakness  (generalized) (M62.81);Repeated falls (R29.6);Ataxic gait (R26.0);Hemiplegia and hemiparesis Hemiplegia - Right/Left: Left Hemiplegia - dominant/non-dominant: Non-dominant Hemiplegia - caused by: Cerebral infarction    Time: 1121-1205 PT Time Calculation (min) (ACUTE ONLY): 44 min   Charges:   PT Evaluation $PT Eval Moderate Complexity: 1 Mod PT Treatments $Gait Training: 8-22 mins $Therapeutic Exercise: 8-22 mins   PT G Codes:   PT G-Codes **NOT FOR INPATIENT CLASS** Functional Assessment Tool Used: AM-PAC 6 Clicks Basic Mobility    Ramond Dial 02/28/2018, 1:46 PM   Mee Hives, PT MS Acute Rehab Dept. Number: [place] and [place]

## 2018-02-28 NOTE — Care Management (Signed)
Outpatient OT and PT referral place for [facility] outpatient therapy per patient request.  No further CM needs.

## 2018-02-28 NOTE — Progress Notes (Signed)
STROKE TEAM PROGRESS NOTE   HISTORY OF PRESENT ILLNESS (per record) Jason Oconnor is a 62 y.o. 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 The symptoms were mild to begin with but then persisted.  He also complained of some dizziness as in room spinning that started this morning.  He came to the emergency room for further evaluation since he has a history of stroke. Denies any preceding illnesses.  Denies fevers chills.  Denies visual symptoms.  Reports diffuse head pressure but no headache.  Reports the head pressure to be mild.  Denies chest pain shortness of breath. At baseline, he is able to ambulate around the block without a cane.  He has been seeing physical medicine and rehabilitation since his last stroke and was discharged from the practice roughly about 6 months ago.  He has tried multiple antispastic medications but they have not really worked well for him.  He had had a conversation about Botox with his physical medicine doctor but never received Botox injections on the left.  He has a foot drop for which he uses a brace.   LKW: 9 AM on 02/25/2018 tpa given?: no, outside the window Premorbid modified Rankin scale (mRS):2     SUBJECTIVE (INTERVAL HISTORY) No family members present.  The patient is alert and oriented follows all commands.  He appears to be doing very well.  Possible mild dysarthria.    OBJECTIVE Temp:  [97.7 F (36.5 C)-98.5 F (36.9 C)] 97.8 F (36.6 C) (04/06 0506) Pulse Rate:  [58-81] 67 (04/06 0506) Cardiac Rhythm: Normal sinus rhythm (04/05 2345) Resp:  [12-18] 12 (04/05 2215) BP: (92-170)/(66-100) 112/66 (04/06 0506) SpO2:  [96 %-100 %] 99 % (04/06 0506) Weight:  [214 lb 8.1 oz (97.3 kg)] 214 lb 8.1 oz  (97.3 kg) (04/05 2340)  CBC:  Recent Labs  Lab 02/27/18 1319 02/27/18 1335  WBC 6.5  --   NEUTROABS 3.7  --   HGB 14.4 15.0  HCT 43.0 44.0  MCV 88.8  --   PLT 262  --     Basic Metabolic Panel:  Recent Labs  Lab 02/27/18 1319 02/27/18 1335  NA 138 140  K 4.8 4.7  CL 105 104  CO2 23  --   GLUCOSE 106* 100*  BUN 17 19  CREATININE 1.16 1.10  CALCIUM 9.8  --     Lipid Panel:     Component Value Date/Time   CHOL 174 02/28/2018 0412   TRIG 239 (H) 02/28/2018 0412   HDL 37 (L) 02/28/2018 0412   CHOLHDL 4.7 02/28/2018 0412   VLDL 48 (H) 02/28/2018 0412   LDLCALC 89 02/28/2018 0412   HgbA1c:  Lab Results  Component Value Date   HGBA1C 5.4 02/28/2018   Urine Drug Screen: No results found for: LABOPIA, COCAINSCRNUR, LABBENZ, AMPHETMU, THCU, LABBARB  Alcohol Level No results found for: ETH  IMAGING  Ct Angio Head W Or Wo Contrast Ct Angio Neck W Or Wo Contrast 02/27/2018 IMPRESSION:  1. Patent carotid and vertebral arteries. No dissection, aneurysm, or hemodynamically significant stenosis utilizing NASCET criteria.  2. Patent anterior and posterior intracranial circulation. No large vessel occlusion, aneurysm, or significant stenosis.    Ct Head Wo Contrast 02/27/2018 IMPRESSION:  1. No acute finding.  2.  Remote perforator infarct affecting the right putamen and corona radiata.    Mr Jeri Cos Wo Contrast 02/27/2018 IMPRESSION:  1. Acute 4 mm RIGHT inferior cerebellar infarct. New nonacute subcentimeter LEFT inferior cerebellar infarct.  2. Old RIGHT basal ganglia infarct. Old LEFT thalamus lacunar infarct versus perivascular space.     Transthoracic Echocardiogram - Left ventricle: The cavity size was normal. Wall thickness was   normal. Systolic function was normal. The estimated ejection   fraction was in the range of 55% to 60%. Wall motion was normal;   there were no regional wall motion abnormalities    PHYSICAL EXAM Vitals:   02/27/18 2215 02/27/18  2340 02/28/18 0144 02/28/18 0506  BP: (!) 141/89 138/89 134/82 112/66  Pulse: (!) 58 72 63 67  Resp: 12     Temp:  98 F (36.7 C) 97.7 F (36.5 C) 97.8 F (36.6 C)  TempSrc:  Oral Oral Oral  SpO2: 97% 96% 97% 99%  Weight:  214 lb 8.1 oz (97.3 kg)    Height:  6\' 1"  (1.854 m)      Pleasant middle aged Caucasian male not in distress. . Afebrile. Head is nontraumatic. Neck is supple without bruit.    Cardiac exam no murmur or gallop. Lungs are clear to auscultation. Distal pulses are well felt. Neurological Exam :  Awake alert oriented x 3 normal speech and language. Mild left lower face asymmetry. Tongue midline. No drift. Mild diminished fine finger movements on left. Orbits right over left upper extremity. Mild left grip [email or web address] left foot drop. Normal sensation . Normal coordination.   ASSESSMENT/PLAN Mr. Jason Oconnor is a 62 y.o. male with history of hypertension, previous stroke, "bone marrow disease", and hyperlipidemia presenting with left arm weakness and dizziness.  He did not receive IV t-PA due to late presentation.  Stroke: Cerebellar infarcts -small vessel disease  Resultant  Dizziness resolved. Old left hemiparesis  CT head -no acute findings  MRI head - Acute 4 mm RIGHT inferior cerebellar infarct. New nonacute subcentimeter LEFT inferior cerebellar infarct. Old RIGHT basal ganglia infarct. Old LEFT thalamus lacunar infarct versus perivascular space.  MRA head - not performed  CTA H&N -unremarkable  Carotid Doppler - CTA neck 2D Echo -Left ventricle: The cavity size was normal. Wall thickness was   normal. Systolic function was normal. The estimated ejection   fraction was in the range of 55% to 60%. Wall motion was normal;    there were no regional   LDL 89  HgbA1c - 5.4  VTE prophylaxis - Lovenox  Fall precautions  clopidogrel 75 mg daily prior to admission, now on aspirin 81 mg daily and clopidogrel 75 mg daily x 3 weeks then aspirin alone  Patient  counseled to be compliant with his antithrombotic medications  Ongoing aggressive stroke risk factor management  Therapy recommendations:  pending  Disposition:  Pending  Hypertension  Stable  Permissive hypertension (OK if < 220/120) but gradually normalize in 5-7 days . Long-term BP goal normotensive  Hyperlipidemia  Lipid lowering medication PTA:  Zetia 10 mg daily  LDL 89, goal < 70  Current lipid lowering medication: Add low dose Zocor.  Pt states he developed "blood clots" secondary to Lipitor. Spoke with pharmacist at [facility] - there is no mention of such an adverse reaction in their literature. Will start low dose Zocor as discussed with Dr Leonie Man.  Continue statin at discharge   Other Stroke Risk Factors  Advanced age  Former cigarette smoker - quit  ETOH use, advised to drink no more than 1 alcoholic beverage per day.  Overweight, Body mass index is 28.3 kg/m., recommend weight loss, diet and exercise as appropriate   Hx stroke/TIA   Plan / Recommendations   Stroke workup: completed  Therapy Follow Up: pending  Disposition: pending  Antiplatelet / Anticoagulation: ASA and Plavix for 3 weeks then either agent alone.  Statin: Add low dose Zocor to Zetia (see note above regarding Lipitor intolerance)  MD Follow Up: [facility] - 6 weeks  Further risk factor modification per primary care MD: Follow Up 2 weeks   Hospital day # 0  Mikey Bussing PA-C [facility] Pager [phone] 02/28/2018, 1:07 PM I have personally examined this patient, reviewed notes, independently viewed imaging studies, participated in medical decision making and plan of care.ROS completed by me personally and pertinent positives fully documented  I have made any additions or clarifications directly to the above note. Agree with note above.  He presented with transient dizziness secondary to small cerebellar infarct from small vessel disease.  He has prior history of right  subcortical infarct with residual mild left hemiparesis.  Recommend dual antiplatelet therapy of aspirin and Plavix for 3 weeks followed by single agent alone.  Await therapy recommendations.  Recommend Zocor as patient has not tried it before and had some trouble with Lipitor in the past.  If he is unable to tolerate this may consider switching to the new PC SK 9 inhibitor injections at outpatient visit.  Long discussion with the patient and with Dr. Lenn Sink and answered questions.  Greater than 50% time during this 35-minute visit was spent on counseling and coordination of care about his lacunar infarct, stroke risk factor modification discussion and answering questions  Antony Contras, MD Medical Director [place] Pager: [phone] 02/28/2018 4:33 PM   To contact Stroke Continuity provider, please refer to [email or web address] After hours, contact General Neurology

## 2018-03-09 ENCOUNTER — Telehealth: Payer: Self-pay | Admitting: Adult Health

## 2018-03-09 ENCOUNTER — Telehealth: Payer: Self-pay | Admitting: Neurology

## 2018-03-09 DIAGNOSIS — Z8673 Personal history of transient ischemic attack (TIA), and cerebral infarction without residual deficits: Secondary | ICD-10-CM

## 2018-03-09 NOTE — Telephone Encounter (Signed)
Called back regarding recent concerns of recurrent symptoms from recent stroke admission. On Friday and Saturday, patient experienced ear plugging and weakness on left side that lasted only for 5-10 minutes. He feels as though he left side may be slightly weaker compared to when he left the hospital. These could be TIAs or evolution of stroke. MRI brain placed to assess for stroke.

## 2018-03-09 NOTE — Telephone Encounter (Signed)
Patient's wife states Saturday night patient had pressure in his head and some weakness. It lasted about 15 minutes. Does patient need to be seen sooner in our office? Please call and discuss.

## 2018-03-09 NOTE — Addendum Note (Signed)
Addended by: Venancio Poisson on: 03/09/2018 05:10 PM   Modules accepted: Orders

## 2018-03-10 NOTE — Telephone Encounter (Signed)
Revised. 

## 2018-03-10 NOTE — Telephone Encounter (Signed)
[employer] Auth: [id number] (exp. 03/10/18 to 04/24/18) patient is scheduled at [facility] for Saturday 03/14/18 arrival time is 10:30 AM. Patient wife is aware of time & day. She asked if there is any way she can get a sooner appointment with Dr. Leonie Man sooner than June.

## 2018-03-10 NOTE — Telephone Encounter (Addendum)
Rn call patients wife back about wanting a earlier appt than June that's schedule. Rn stated if her husband wants a earlier appt per Janett Billow NP he can be schedule in the  next week or two. The wife wanted to know if something is seen on the scan on Saturday will the MD call her. Rn stated  the radiologist will call the work in md at [facility] to give results if they see something urgent or concerning on the MRI. Also they will call the pt or family if its urgent or they see something abnormal. RN stated to wife Dr. Leonie Man works in the hospital every other week. THe wife stated she will wait until the results of the MRI are in to make a decision about an earlier appt. Rn remind pt to call back if she wants a earlier appt with Janett Billow NP. The wife verbalized understanding.

## 2018-03-11 NOTE — Telephone Encounter (Signed)
error 

## 2018-03-14 ENCOUNTER — Ambulatory Visit
Admission: RE | Admit: 2018-03-14 | Discharge: 2018-03-14 | Disposition: A | Discharge status: Home / Self Care | Payer: 59 | Source: Ambulatory Visit | Attending: Adult Health | Admitting: Adult Health

## 2018-03-14 DIAGNOSIS — Z8673 Personal history of transient ischemic attack (TIA), and cerebral infarction without residual deficits: Secondary | ICD-10-CM | POA: Diagnosis present

## 2018-03-14 DIAGNOSIS — Z09 Encounter for follow-up examination after completed treatment for conditions other than malignant neoplasm: Secondary | ICD-10-CM | POA: Insufficient documentation

## 2018-03-16 ENCOUNTER — Telehealth: Payer: Self-pay | Admitting: Neurology

## 2018-03-16 ENCOUNTER — Other Ambulatory Visit: Payer: Self-pay | Admitting: Neurology

## 2018-03-16 DIAGNOSIS — Z8673 Personal history of transient ischemic attack (TIA), and cerebral infarction without residual deficits: Secondary | ICD-10-CM

## 2018-03-16 NOTE — Telephone Encounter (Signed)
-----   Message from Darleen Crocker, RN sent at 03/16/2018 10:22 AM EDT -----   ----- Message ----- From: Venancio Poisson, NP Sent: 03/16/2018   8:14 AM To: Marval Regal, RN  Can you please call patient and let him know that his MRI did show his old strokes but no new stroke. Please ensure he is doing physical therapy for his left sided weakness. Thank you!

## 2018-03-16 NOTE — Telephone Encounter (Signed)
Heard from Serita Kyle at [facility] and appears the orders because they were placed at the hospital, they are required to have the office that will follow the patient place the orders. I have put the PT/OT orders in for the patient and made a note for them to touch base with the patient soon.

## 2018-03-16 NOTE — Telephone Encounter (Signed)
Called the pt to make him aware of the MRI results. Pt verbalized understanding. I informed him that Janett Billow wanted to make sure he was doing therapy to help with his weakness. The patient states that he has not heard from anyone on this I will reach out to someone in the [facility] rehab dept and also referral coordinator and have them check into this for him. I did advise that the patient should give [place] a call if he has not heard from them so that we can help with staying on top of this for him. These referrals for PT and OT were placed on 4/6 looks like through the hospital setting. Pt verbalized understanding.

## 2018-03-17 NOTE — Telephone Encounter (Signed)
Orders have been sent for PT / OT [facility]

## 2018-03-30 ENCOUNTER — Ambulatory Visit: Payer: 59 | Attending: Internal Medicine | Admitting: Physical Therapy

## 2018-03-30 ENCOUNTER — Encounter: Payer: Self-pay | Admitting: Physical Therapy

## 2018-03-30 DIAGNOSIS — R2689 Other abnormalities of gait and mobility: Secondary | ICD-10-CM

## 2018-03-30 DIAGNOSIS — R2681 Unsteadiness on feet: Secondary | ICD-10-CM | POA: Insufficient documentation

## 2018-03-30 DIAGNOSIS — R278 Other lack of coordination: Secondary | ICD-10-CM | POA: Diagnosis present

## 2018-03-30 DIAGNOSIS — M6281 Muscle weakness (generalized): Secondary | ICD-10-CM | POA: Insufficient documentation

## 2018-03-30 DIAGNOSIS — I639 Cerebral infarction, unspecified: Secondary | ICD-10-CM | POA: Insufficient documentation

## 2018-03-30 NOTE — Therapy (Addendum)
[place] MAIN [facility] SERVICES [address] [place], [place], [address] Phone: [phone]   Fax:  [phone]  Physical Therapy Evaluation  Patient Details  Name: Jason Oconnor MRN: [id number] Date of Birth: 29-Jan-1956 No data recorded  Encounter Date: 03/30/2018  PT End of Session - 03/30/18 1315    Visit Number  1    Number of Visits  25    Date for PT Re-Evaluation  06/22/18    PT Start Time  0105    PT Stop Time  0200    PT Time Calculation (min)  55 min    Equipment Utilized During Treatment  Gait belt    Activity Tolerance  Patient tolerated treatment well    Behavior During Therapy  WFL for tasks assessed/performed       Past Medical History:  Diagnosis Date  . Bone marrow disease   . High cholesterol   . Hypertension   . Stroke [facility])     Past Surgical History:  Procedure Laterality Date  . ELBOW SURGERY Right    Tendinitis    There were no vitals filed for this visit.   Subjective Assessment - 03/30/18 1308    Subjective  Patient gets tired very quickly on his left side.     Patient is accompained by:  -- wife    Pertinent History  Patient states that he has chronic mild left-sided weakness and mild numbness from previous stroke. In the past 2 days, he has been feeling worsening weakness in left arm and leg. No vision change, hearing loss. No slurred speech, facial droop. He also has dizziness. Patient denies chest pain, shortness of breath, cough, fever or chills. No nausea, vomiting, diarrhea, abdominal pain, symptoms of UTI.He has a foot drop for which he uses a brace. : he has had at least two episodes of a stroke and the first was Aug 2017 and then April 5. The recent stroke was associted with head pressure and then some left sided weakness in the left arm and it is better. They called the Neurologist and he had an MRI Saturday and there was not change in the MRI from April 5. He had had a shaking spell and weakness last  week with a weird feeling in his chest for a few minutes and then it went away. He has been monitored and he has not had afib. Since he has four different stroke sites in the brain it is wondered if he may have atrial fib.     Limitations  Walking    How long can you stand comfortably?  5 mins    How long can you walk comfortably?  5 mins    Diagnostic tests  See history    Patient Stated Goals  "Get back as close as I can to normal." Pt would to be able to play golf. Pt would like to return to some kind of work.     Currently in Pain?  No/denies    Pain Score  0-No pain    Pain Onset  1 to 4 weeks ago    Multiple Pain Sites  No         [facility] PT Assessment - 03/30/18 1312      Assessment   Medical Diagnosis  CVA    Onset Date/Surgical Date  02/27/18    Hand Dominance  Right    Next MD Visit  Next week with Dr. Letta Pate    Prior Therapy  yes  Precautions   Precautions  Fall    Required Braces or Orthoses  -- used to use a AFO 6 months ago and now doesnt need it      Restrictions   Weight Bearing Restrictions  No      Balance Screen   Has the patient fallen in the past 6 months  No    Has the patient had a decrease in activity level because of a fear of falling?   Yes    Is the patient reluctant to leave their home because of a fear of falling?   No      Home Film/video editor residence    Living Arrangements  Spouse/significant other    Available Help at Discharge  Family    Type of [place] to enter    [place]  One level    [place] - quad;Shower seat      Prior Function   Level of Independence  Independent with basic ADLs    Vocation  On disability    Leisure  Golf      Cognition   Overall Cognitive Status  Within Functional Limits for tasks assessed    Attention  Focused        Plan - 04/13/18 0805    Clinical Impression Statement  Patient presents  with dx of CVA . He has strength deficits LLE, decreased dynamic standing balance and decreased gait speed. His  outcome measures indicate a falls risk . He has impairments that he can benefit from skilled PT to improve and reach goals to improve his quality of llife.     Clinical Decision Making  Low    Rehab Potential  Good    PT Frequency  2x / week    PT Duration   8 weeks    PT Treatment/Interventions  Aquatic Therapy;Manual techniques;Balance training;Neuromuscular re-education;Therapeutic activities;Therapeutic exercise;Gait training;Stair training;Orthotic Fit/Training;Dry needling    PT Next Visit Plan  balance training    PT Home Exercise Plan  heel raises, wall squats    Consulted and Agree with Plan of Care  Patient         POSTURE: WNL   PROM/AROM:  WFL BUE and BLE  STRENGTH:  Graded on a 0-5 scale Muscle Group Left Right                          Hip Flex 4/5 5/5  Hip Abd 4/5 5/5  Hip Add 4/5 5/5  Hip Ext 4/5 5/5  Hip IR/ER    Knee Flex 5/5 5/5  Knee Ext 5/5 5/5  Ankle DF 4/5 5/5  Ankle PF 3/5 5/5   SENSATION: left hand numbness  Sensation WFL RUE and BLE    FUNCTIONAL MOBILITY: independent bed mobility  Independent transfers    BALANCE: Standing Dynamic Balance  Normal Stand independently unsupported, able to weight shift and cross midline maximally   Good Stand independently unsupported, able to weight shift and cross midline moderately x  Good-/Fair+ Stand independently unsupported, able to weight shift across midline minimally   Fair Stand independently unsupported, weight shift, and reach ipsilaterally, loss of balance when crossing midline   Poor+ Able to stand with Min A and reach ipsilaterally, unable to weight shift   Poor Able to stand with Mod A and minimally reach ipsilaterally,  unable to cross midline.     Static Standing Balance  Normal Able to maintain standing balance against maximal resistance   Good Able to maintain standing  balance against moderate resistance x  Good-/Fair+ Able to maintain standing balance against minimal resistance   Fair Able to stand unsupported without UE support and without LOB for 1-2 min   Fair- Requires Min A and UE support to maintain standing without loss of balance   Poor+ Requires mod A and UE support to maintain standing without loss of balance   Poor Requires max A and UE support to maintain standing balance without loss       GAIT: Patient ambulates with spc for outdoor and community, indoors no AD   OUTCOME MEASURES: TEST Outcome Interpretation  5 times sit<>stand 13.55sec >34 yo, >15 sec indicates increased risk for falls  10 meter walk test     .96            m/s <1.0 m/s indicates I  ncreased risk for falls; limited community ambulator  Timed up and Go     10.94            sec <14 sec indicates increased risk for falls  6 minute walk test 1070               Feet 1000 feet is community Water quality scientist 52/56 <36/56 (100% risk for falls), 37-45 (80% risk for falls); 46-51 (>50% risk for falls); 52-55 (lower risk <25% of falls)    Treatment: Leg press 60 lbs x 20 x 3 heel raises LLE only HEP standing heel raises bilaterally Wall squats with 10 sec hold x 10        Objective measurements completed on examination: See above findings.              PT Education - 03/30/18 1315    Education provided  Yes    Education Details  plan of care    Person(s) Educated  Patient    Methods  Explanation    Comprehension  Verbalized understanding          PT Long Term Goals - 03/30/18 1356      PT LONG TERM GOAL #1   Title  Pt will be independent with HEP in order to improve strength and balance in order to decrease fall risk and improve function at home and work.    Baseline  10/30/16: Mod-min A for exericise progression/ performance 12/18/16: Requires minA for technique with exercise; 02/21/17: modA-maxA and cueing for muscle activation     Time  12    Period  Weeks    Status  New    Target Date  06/22/18      PT LONG TERM GOAL #2   Title  Pt will improve BERG by at least 3 points in order to demonstrate clinically significant improvement in balance.    Baseline  08/07/16: 49/56 10/02/16: 53/56 03/30/18 :52/56     Time  8    Period  Weeks    Status  New    Target Date  06/22/18      PT LONG TERM GOAL #3   Title  Pt will decrease TUG to below 14 seconds in order to demonstrate decreased fall risk     Baseline  08/07/16: 21.9 seconds; 10/02/16: 12.3     Time  8    Period  Weeks    Status  Achieved  PT LONG TERM GOAL #4   Title  Pt will decrease 5TSTS by at least 3 seconds in order to demonstrate clinically significant improvement in LE strength    Baseline  08/07/16: 18.5 seconds; 10/02/16: 14.3    Time  8    Period  Weeks    Status  Achieved      PT LONG TERM GOAL #5   Title  Pt will increase 10MWT by at least 0.13 m/s in order to demonstrate clinically significant improvement in community ambulation    Baseline  08/07/16: 0.53 m/s 10/02/16: .97 m/s    Time  12    Period  Weeks    Status  New    Target Date  06/22/18      PT LONG TERM GOAL #6   Title  Pt will be able to swing a golf club safely in order to complete 9 holes of golf    Baseline  10/30/16: Unable to swing golf club safely 12/18/16: unable to swing golf club; 02/21/17: unable to swing golf club    Time  6    Period  Weeks    Status  On-going      PT LONG TERM GOAL #7   Title  Pt will improve 6MWT by at least 183ft  to demonstrate improved aerobic capacity/endurance    Baseline  582 ft 10/02/16: 1143ft    Time  7    Period  Weeks    Status  Achieved      PT LONG TERM GOAL #8   Title  Patient will improve DGI by 4 points to demonstrate improvement in dynamic gait/balance and decrease fall risk    Baseline  10/02/16: 19/24; 10/30/16: 21/24 12/18/16: 23/24    Time  6    Period  Weeks    Status  Achieved      PT LONG TERM GOAL  #9   TITLE  Patient will  demonstrate ability to perform single leg stance for >10 sec bilaterally to better be able to climb ladders    Baseline  L LE SLS: 3 sec; L LE SLS: 4sec    Time  8    Period  Weeks    Status  On-going      PT LONG TERM GOAL  #10   TITLE  Patient will be able to tandem walk 70ft without loss of balance to better perform dynamic balance activities to perform occupational duties.    Baseline  Requires stepping responses to maintain balance with tandem walking; 02/21/17: Requires 4 step downs to maintain balance    Time  8    Period  Weeks    Status  On-going      PT LONG TERM GOAL  #11   TITLE  Pt will demonstrate 3/5 MMT strength in ankle eversion, toe ext and dorsiflexion to allow for the performance of ankle strategies to maintain balance    Baseline  1+/5: toe ext; dorsiflexion 2+/5; ankle eversion: 1+/5    Time  6    Period  Weeks    Status  New               Patient will benefit from skilled therapeutic intervention in order to improve the following deficits and impairments:     Visit Diagnosis: Muscle weakness (generalized)  Unsteadiness on feet  Other lack of coordination  Other abnormalities of gait and mobility     Problem List Patient Active Problem List   Diagnosis Date Noted  .  HLD (hyperlipidemia) 02/27/2018  . Stroke ([place]) 02/27/2018  . GERD (gastroesophageal reflux disease) 02/27/2018  . Essential hypertension 02/27/2018  . 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 Neurocognitive disorder   . Left foot drop   . Benign essential HTN   . Hemiparesis affecting nondominant side as late effect of cerebrovascular accident (CVA) ([place]) 07/18/2016  . Right basal ganglia embolic stroke ([place]) 91/47/8295  . CVA (cerebral infarction) 07/15/2016  . TIA (transient ischemic attack) 07/14/2016  . HTN, goal below 140/80 07/14/2016  . Left facial numbness 07/14/2016  . Weakness of left side of body 07/14/2016    Alanson Puls, [place] DPT 03/30/2018, 2:00 PM  [place] MAIN [facility] SERVICES [address] [place], [place], [address] Phone: [phone]   Fax:  [phone]  Name: Jason Oconnor MRN: [id number] Date of Birth: 10-03-1956

## 2018-03-30 NOTE — Addendum Note (Signed)
Addended by: Alanson Puls on: 03/30/2018 02:36 PM   Modules accepted: Orders

## 2018-03-31 ENCOUNTER — Other Ambulatory Visit: Payer: Self-pay

## 2018-03-31 ENCOUNTER — Encounter: Payer: Self-pay | Admitting: Occupational Therapy

## 2018-03-31 ENCOUNTER — Ambulatory Visit: Payer: 59 | Admitting: Occupational Therapy

## 2018-03-31 DIAGNOSIS — M6281 Muscle weakness (generalized): Secondary | ICD-10-CM

## 2018-03-31 DIAGNOSIS — R278 Other lack of coordination: Secondary | ICD-10-CM

## 2018-03-31 NOTE — Therapy (Signed)
[place] MAIN [facility] SERVICES [address] [place], [place], [address] Phone: [phone]   Fax:  [phone]  Occupational Therapy Evaluation  Patient Details  Name: Jason Oconnor MRN: [id number] Date of Birth: 09-11-1956 Referring Provider: Dr. Sharol Given   Encounter Date: 03/31/2018  OT End of Session - 03/31/18 1107    Visit Number  1    Number of Visits  24    Date for OT Re-Evaluation  06/23/18    Authorization Time Period  40 visit limit combined    OT Start Time  1100    OT Stop Time  1200    OT Time Calculation (min)  60 min    Behavior During Therapy  [facility] for tasks assessed/performed       Past Medical History:  Diagnosis Date  . Bone marrow disease   . High cholesterol   . Hypertension   . Stroke [facility])     Past Surgical History:  Procedure Laterality Date  . ELBOW SURGERY Right    Tendinitis    There were no vitals filed for this visit.  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    Pertinent History  Pt. is a 62 y.o. male wwith a history of a CVA. Pt. was admitted to [facility] on 02/27/2018 with a new onset of CVA, and left sided weakness. Pt. was admitted for one night. Imaging revealed: Acute 4 mm RIGHT inferior cerebellar infarct. New nonacute subcentimeter Left Inferior Cerebellar Infarct. Old Right Basal Ganglia Infarct. Remote Perforator Infarct affecting the right putemen, and corona..    Patient Stated Goals  To get back to doing as much as he can without fatigue    Currently in Pain?  No/denies        [facility] OT Assessment - 03/31/18 1108      Assessment   Medical Diagnosis  CVA    Referring Provider  Dr. Sharol Given    Onset Date/Surgical Date  02/27/18    Hand Dominance  Right    Next MD Visit  03/14/2018    Prior Therapy  Yes      Precautions   Precautions  Fall      Balance Screen   Has the patient fallen in the past 6 months  No    Has the  patient had a decrease in activity level because of a fear of falling?   No    Is the patient reluctant to leave their home because of a fear of falling?   No      Home  Environment   Family/patient expects to be discharged to:  Private residence    Living Arrangements  Spouse/significant other    Available Help at [place]  One level    Alternate Level Stairs - Number of Steps  1    Bathroom Shower/Tub  [place] - single point    Lives With  Spouse      Prior Function   Level of Independence  Independent    Vocation  On disability    Leisure  [place] furnature in the garage, Golf      ADL   Eating/Feeding  Independent Assist cutting the tough meat    Grooming  Independent    Upper Body Bathing  Independent    Lower Body Bathing  Independent    Upper Body Dressing  Independent    Lower Body Dressing  Independent    Toilet Transfer  Independent    [place] Transfer  Independent      IADL   Shopping  Needs to be accompanied on any shopping trip harder time getting through the store to the check out.    Light Housekeeping  Performs light daily tasks such as dishwashing, bed making    Meal Prep  Plans, prepares and serves adequate meals independently    Prior Level of Function Medication Managment  Independent    Financial Management  -- Wife always handles all finances.      Written Expression   Dominant Hand  Right    Handwriting  Increased time;100% legible      Vision - History   Baseline Vision  Bifocals    Patient Visual Report  -- Left eye waters more      Activity Tolerance   Activity Tolerance  Tolerates < 10 min activity with changes in vital signs      Cognition   Overall Cognitive Status  Within Functional Limits for tasks assessed    Memory  Impaired STM       Sensation   Light Touch  Appears Intact    Proprioception  Appears Intact      Coordination   Gross Motor Movements are Fluid and Coordinated  No    Finger Nose Finger Test  -- Mild impairment  on the left    Right 9 Hole Peg Test  25    Left 9 Hole Peg Test  58 dropped multiple pegs      Strength   Left Shoulder Flexion  4-/5    Left Shoulder ABduction  4-/5    Left Elbow Flexion  4+/5    Left Wrist Flexion  4+/5      Hand Function   Right Hand Grip (lbs)  100    Right Hand Lateral Pinch  28 lbs    Right Hand 3 Point Pinch  22 lbs    Left Hand Grip (lbs)  40    Left Hand Lateral Pinch  22 lbs    Left 3 point pinch  15 lbs                           OT Long Term Goals - 03/31/18 1458      OT LONG TERM GOAL #1   Title  Pt. will improve LUE strength by 1 mm grade to assist with ADLs, and IADLs.     Baseline  Eval: Limited LUE strength    Time  12    Period  Weeks    Status  New    Target Date  06/23/18      OT LONG TERM GOAL #2   Title  Pt. will improve left grip strength by 10# to be able to hold a full glass.    Baseline  Eval: Left grip strength: 40#    Time  12    Period  Weeks    Status  New    Target Date  06/23/18      OT LONG TERM GOAL #3   Title  Pt. will improve left hand [facility] skills by 5 sec. to be able to perform buttoning  Baseline  Eval: Left hand limited [place], limited buttoning    Time  12    Period  Weeks    Status  New    Target Date  06/23/18      OT LONG TERM GOAL #4   Title  Pt. will improve 3pt. pinch strength to be able to clip nails.    Baseline  Eval: Pt. has difficulty    Time  12    Period  Weeks    Status  New    Target Date  06/23/18      OT LONG TERM GOAL #5   Title  Pt. will increase activity tolerance to be able to tolerate 20 min. of ADL/IADL activity without rest breaks.     Baseline  Eval: Pt. tolerate 5-10 min. of activity.    Time  12    Period  Weeks    Status  New    Target Date  06/23/18             Plan - 03/31/18 1439    Clinical Impression Statement  Pt. is a 62 y.o. male who has a history of CVAs, with his most recent CVA on 02/27/2018. Pt. presents with impaired motor control, left sided weakness, incoordination, and limited activity tolerance which limits his ability to efficiently perform buttoning, using nail clippers, cutting meat, handling change, holding and pouring a glass of water. Pt. could benefit from skilled OT services to improve LUE functioning, for improved efficiency during ADL tasks, and to improve activity tolerance during ADLs, and IADLs.    Occupational performance deficits (Please refer to evaluation for details):  ADL's;IADL's    Current Impairments/barriers affecting progress:  Positive  Indicators: age, motivation, family support Negative Indicators: multiple comorbidities.    OT Frequency  1x / week    OT Duration  12 weeks    OT Treatment/Interventions  Self-care/ADL training;Therapeutic exercise;Therapeutic activities;Energy conservation;Neuromuscular education;Patient/family education    Clinical Decision Making  Several treatment options, min-mod task modification necessary    Consulted and Agree with Plan of Care  Patient       Patient will benefit from skilled therapeutic intervention in order to improve the following deficits and impairments:  Decreased coordination, Impaired UE functional use, Decreased endurance, Decreased activity tolerance, Decreased range of motion, Pain, Decreased strength  Visit Diagnosis: Muscle weakness (generalized)  Other lack of coordination    Problem List Patient Active Problem List   Diagnosis Date Noted  . HLD (hyperlipidemia) 02/27/2018  . Stroke ([place]) 02/27/2018  . GERD (gastroesophageal reflux disease) 02/27/2018  . Essential hypertension 02/27/2018  . 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 Neurocognitive disorder   . Left foot drop   . Benign essential HTN    . Hemiparesis affecting nondominant side as late effect of cerebrovascular accident (CVA) ([place]) 07/18/2016  . Right basal ganglia embolic stroke ([place]) 98/33/8250  . CVA (cerebral infarction) 07/15/2016  . TIA (transient ischemic attack) 07/14/2016  . HTN, goal below 140/80 07/14/2016  . Left facial numbness 07/14/2016  . Weakness of left side of body 07/14/2016    Harrel Carina, MS, OTR/L 03/31/2018, 5:46 PM  [place] MAIN [facility] SERVICES [address] [place], [place], [address] Phone: [phone]   Fax:  [phone]  Name: Daray Polgar MRN: [id number] Date of Birth: 1956/05/14

## 2018-04-06 ENCOUNTER — Encounter: Payer: Self-pay | Admitting: Occupational Therapy

## 2018-04-06 ENCOUNTER — Encounter: Payer: Self-pay | Admitting: Physical Therapy

## 2018-04-06 ENCOUNTER — Ambulatory Visit: Payer: 59 | Admitting: Physical Therapy

## 2018-04-06 ENCOUNTER — Ambulatory Visit: Payer: 59 | Admitting: Occupational Therapy

## 2018-04-06 DIAGNOSIS — M6281 Muscle weakness (generalized): Secondary | ICD-10-CM | POA: Diagnosis not present

## 2018-04-06 DIAGNOSIS — R278 Other lack of coordination: Secondary | ICD-10-CM

## 2018-04-06 DIAGNOSIS — R2681 Unsteadiness on feet: Secondary | ICD-10-CM

## 2018-04-06 NOTE — Therapy (Signed)
[place] MAIN [facility] SERVICES [address] [place], [place], [address] Phone: [phone]   Fax:  [phone]  Physical Therapy Treatment  Patient Details  Name: Jason Oconnor MRN: [id number] Date of Birth: [birth year] Referring Provider: Dr. Sharol Given   Encounter Date: 04/06/2018  PT End of Session - 04/06/18 0949    Visit Number  2    Number of Visits  25    Date for PT Re-Evaluation  06/22/18    PT Start Time  0945    PT Stop Time  1015    PT Time Calculation (min)  30 min    Equipment Utilized During Treatment  Gait belt    Activity Tolerance  Patient tolerated treatment well    Behavior During Therapy  [facility] for tasks assessed/performed       Past Medical History:  Diagnosis Date  . Bone marrow disease   . High cholesterol   . Hypertension   . Stroke [facility])     Past Surgical History:  Procedure Laterality Date  . ELBOW SURGERY Right    Tendinitis    There were no vitals filed for this visit.  Subjective Assessment - 04/06/18 0948    Subjective  Patient says that his  left shoulder is 3/10 today,    Patient is accompained by:  -- wife    Pertinent History  Patient states that he has chronic mild left-sided weakness and mild numbness from previous stroke. In the past 2 days, he has been feeling worsening weakness in left arm and leg. No vision change, hearing loss. No slurred speech, facial droop. He also has dizziness. Patient denies chest pain, shortness of breath, cough, fever or chills. No nausea, vomiting, diarrhea, abdominal pain, symptoms of UTI.He has a foot drop for which he uses a brace. : he has had at least two episodes of a stroke and the first was Aug 2017 and then April 5. The recent stroke was associted with head pressure and then some left sided weakness in the left arm and it is better. They called the Neurologist and he had an MRI Saturday and there was not change in the MRI from April 5. He had had a shaking spell  and weakness last week with a weird feeling in his chest for a few minutes and then it went away. He has been monitored and he has not had afib. Since he has four different stroke sites in the brain it is wondered if he may have atrial fib.     Limitations  Walking    How long can you stand comfortably?  5 mins    How long can you walk comfortably?  5 mins    Diagnostic tests  See history    Patient Stated Goals  "Get back as close as I can to normal." Pt would to be able to play golf. Pt would like to return to some kind of work.     Currently in Pain?  Yes    Pain Score  3     Pain Location  Shoulder    Pain Orientation  Left    Pain Descriptors / Indicators  Aching    Pain Onset  1 to 4 weeks ago    Aggravating Factors   working it hard    Pain Relieving Factors  rest    Effect of Pain on Daily Activities  none    Multiple Pain Sites  No  Treatment;    NEUROMUSCULAR RE-EDUCATION Fwd/retro tandem walk on long airex with no UE support x 12 laps; mod verbal cues to decrease UE support, decreased step length with R throughout, mod verbal cues to increase R step length; CGA - min A for steadiness throughout Bil side stepping on long airex with no UE support x 5 laps; increased difficulty with side stepping R, CGA for steadiness  matrix side stepping , fwd/ bwd stepping  22 . 5 lbs with cues for posture and correct technique x 5 reps One foot 1/2 foam , one foot on disc x 15 trunk rotation x 20 repst  Tapping RLE with LLE on yellow disc and RLE on blue foam to 6 inch steo stool  X20, NO UE support , then alternate feet x 20, difficult with left leg coornination Left heel raises with UE support x 20 Rocker board stagered LE and tapping fwd/ bwd x 20 with cues for posture and to control the movement                    PT Education - 04/06/18 0949    Education provided  Yes    Education Details  HEP    Person(s) Educated  Patient    Methods  Explanation     Comprehension  Verbalized understanding          PT Long Term Goals - 03/30/18 1356      PT LONG TERM GOAL #1   Title  Pt will be independent with HEP in order to improve strength and balance in order to decrease fall risk and improve function at home and work.    Baseline  10/30/16: Mod-min A for exericise progression/ performance 12/18/16: Requires minA for technique with exercise; 02/21/17: modA-maxA and cueing for muscle activation    Time  12    Period  Weeks    Status  New    Target Date  06/22/18      PT LONG TERM GOAL #2   Title  Pt will improve BERG by at least 3 points in order to demonstrate clinically significant improvement in balance.    Baseline  08/07/16: 49/56 10/02/16: 53/56 03/30/18 :52/56     Time  8    Period  Weeks    Status  New    Target Date  06/22/18      PT LONG TERM GOAL #3   Title  Pt will decrease TUG to below 14 seconds in order to demonstrate decreased fall risk     Baseline  08/07/16: 21.9 seconds; 10/02/16: 12.3     Time  8    Period  Weeks    Status  Achieved      PT LONG TERM GOAL #4   Title  Pt will decrease 5TSTS by at least 3 seconds in order to demonstrate clinically significant improvement in LE strength    Baseline  08/07/16: 18.5 seconds; 10/02/16: 14.3    Time  8    Period  Weeks    Status  Achieved      PT LONG TERM GOAL #5   Title  Pt will increase 10MWT by at least 0.13 m/s in order to demonstrate clinically significant improvement in community ambulation    Baseline  08/07/16: 0.53 m/s 10/02/16: .97 m/s    Time  12    Period  Weeks    Status  New    Target Date  06/22/18      PT LONG  TERM GOAL #6   Title  Pt will be able to swing a golf club safely in order to complete 9 holes of golf    Baseline  10/30/16: Unable to swing golf club safely 12/18/16: unable to swing golf club; 02/21/17: unable to swing golf club    Time  12    Period  Weeks    Status  New 06/22/18      PT LONG TERM GOAL #7   Title  Pt will improve 6MWT by at least  160ft  to demonstrate improved aerobic capacity/endurance    Baseline  582 ft 10/02/16: 1138ft    Time  12    Period  Weeks    Status  New 06/22/18      PT LONG TERM GOAL #8   Title  Patient will improve DGI by 4 points to demonstrate improvement in dynamic gait/balance and decrease fall risk    Baseline  10/02/16: 19/24; 10/30/16: 21/24 12/18/16: 23/24    Time  6    Period  Weeks    Status  Achieved      PT LONG TERM GOAL  #9   TITLE  Patient will demonstrate ability to perform single leg stance for >10 sec bilaterally to better be able to climb ladders    Baseline  L LE SLS: 3 sec; L LE SLS: 4sec    Time  8    Period  Weeks    Status  New 06/22/18      PT LONG TERM GOAL  #10   TITLE  Patient will be able to tandem walk 37ft without loss of balance to better perform dynamic balance activities to perform occupational duties.    Baseline  Requires stepping responses to maintain balance with tandem walking; 02/21/17: Requires 4 step downs to maintain balance    Time  12    Period  Weeks    Status  New 06/22/18      PT LONG TERM GOAL  #11   TITLE  Pt will demonstrate 3/5 MMT strength in ankle eversion, toe ext and dorsiflexion to allow for the performance of ankle strategies to maintain balance    Baseline  1+/5: toe ext; dorsiflexion 2+/5; ankle eversion: 1+/5    Time  12    Period  Weeks    Status  New 06/22/18            Plan - 04/06/18 0950    Clinical Impression Statement  Patient instructed in advanced balance/strengthening exercise. Patient fatigues quickly requiring short rest breaks in between advanced exercise. Patient instructed in advanced strengthening with increased repetition/resistance. Patient requires CGA for advanced balance exercise especially with less rail assist. Patient would benefit from additional skilled PT intervention to improve balance/gait safety and reduce fall risk.    Rehab Potential  Good    PT Frequency  2x / week    PT Duration  8 weeks    PT  Treatment/Interventions  Gait training;Therapeutic activities;Functional mobility training;Therapeutic exercise;Balance training;Neuromuscular re-education;Stair training    Consulted and Agree with Plan of Care  Patient       Patient will benefit from skilled therapeutic intervention in order to improve the following deficits and impairments:  Abnormal gait, Decreased balance, Decreased endurance, Decreased mobility, Pain, Impaired flexibility, Decreased strength, Decreased activity tolerance, Difficulty walking  Visit Diagnosis: Other lack of coordination  Muscle weakness (generalized)  Unsteadiness on feet     Problem List Patient Active Problem List   Diagnosis Date Noted  .  HLD (hyperlipidemia) 02/27/2018  . Stroke ([place]) 02/27/2018  . GERD (gastroesophageal reflux disease) 02/27/2018  . Essential hypertension 02/27/2018  . 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 Neurocognitive disorder   . Left foot drop   . Benign essential HTN   . Hemiparesis affecting nondominant side as late effect of cerebrovascular accident (CVA) ([place]) 07/18/2016  . Right basal ganglia embolic stroke ([place]) 40/08/2724  . CVA (cerebral infarction) 07/15/2016  . TIA (transient ischemic attack) 07/14/2016  . HTN, goal below 140/80 07/14/2016  . Left facial numbness 07/14/2016  . Weakness of left side of body 07/14/2016    Alanson Puls, [place] DPT 04/06/2018, 10:01 AM  [place] MAIN [facility] SERVICES [address] [place], [place], [address] Phone: [phone]   Fax:  [phone]  Name: Quenton Recendez MRN: [id number] Date of Birth: 31-Jul-1956

## 2018-04-06 NOTE — Therapy (Signed)
[place] MAIN [facility] SERVICES [address] [place], [place], [address] Phone: [phone]   Fax:  [phone]  Occupational Therapy Treatment  Patient Details  Name: Jason Oconnor MRN: [id number] Date of Birth: 1956-10-04 Referring Provider: Dr. Sharol Given   Encounter Date: 04/06/2018  OT End of Session - 04/06/18 0906    Visit Number  2    Number of Visits  24    Date for OT Re-Evaluation  06/23/18    Authorization Time Period  40 visit limit combined    OT Start Time  0845    OT Stop Time  0930    OT Time Calculation (min)  45 min    Activity Tolerance  Patient tolerated treatment well    Behavior During Therapy  [facility] for tasks assessed/performed       Past Medical History:  Diagnosis Date  . Bone marrow disease   . High cholesterol   . Hypertension   . Stroke [facility])     Past Surgical History:  Procedure Laterality Date  . ELBOW SURGERY Right    Tendinitis    There were no vitals filed for this visit.  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    Pertinent History  Pt. is a 62 y.o. male wwith a history of a CVA. Pt. was admitted to [facility] on 02/27/2018 with a new onset of CVA, and left sided weakness. Pt. was admitted for one night. Imaging revealed: Acute 4 mm RIGHT inferior cerebellar infarct. New nonacute subcentimeter Left Inferior Cerebellar Infarct. Old Right Basal Ganglia Infarct. Remote Perforator Infarct affecting the right putemen, and corona..    Patient Stated Goals  To get back to doing as much as he can without fatigue    Currently in Pain?  No/denies      OT TREATMENT    Neuro muscular re-education:  Pt. worked on translatory movements of the hand grasping and storing 1/2" flat marbles with his hand, and moving the objects through his left hand from the palm to the tip of his 2nd digit, and thumb.  Therapeutic  Exercise:  Pt. worked on the [employer] for 8 min. with constant monitoring of the BUEs. Pt. Worked on changing, and alternating forward reverse position every 2 min. Rest breaks were required. Pt. worked on level 4.0. Pt. Performed left gross gripping with grip strengthener. Pt. worked on sustaining grip while grasping pegs and reaching at various heights. Gripper was placed in the 3rd resistive slot with the white resistive spring. Pt. Worked on pinch strengthening in the left hand for lateral, and 3pt. pinch using yellow, red, green, and blue resistive clips. Pt. worked on placing the clips at various vertical and horizontal angles. Tactile and verbal cues were required for eliciting the desired movement. Pt. Worked on hand strengthening using [employer]  tools with green theraband using the knob turn, and key turn attachments to target specific components of movements in the hand. Pt. Worked with the knobturn attachments to work to focus on standard gripping, and weightbearing push. Pt. Worked on using the key turn attachment for standard turning, and lateral pinch turn.                          OT Education - 04/06/18 0905    Education provided  Yes    Education Details  UE strength, coordination    Person(s) Educated  Patient    Methods  Explanation    Comprehension  Verbalized understanding          OT Long Term Goals - 03/31/18 1458      OT LONG TERM GOAL #1   Title  Pt. will improve LUE strength by 1 mm grade to assist with ADLs, and IADLs.     Baseline  Eval: Limited LUE strength    Time  12    Period  Weeks    Status  New    Target Date  06/23/18      OT LONG TERM GOAL #2   Title  Pt. will improve left grip strength by 10# to be able to hold a full glass.    Baseline  Eval: Left grip strength: 40#    Time  12    Period  Weeks    Status  New    Target Date  06/23/18      OT LONG TERM GOAL #3   Title  Pt. will improve left hand [facility] skills by 5 sec. to be  able to perform buttoning    Baseline  Eval: Left hand limited [place], limited buttoning    Time  12    Period  Weeks    Status  New    Target Date  06/23/18      OT LONG TERM GOAL #4   Title  Pt. will improve 3pt. pinch strength to be able to clip nails.    Baseline  Eval: Pt. has difficulty    Time  12    Period  Weeks    Status  New    Target Date  06/23/18      OT LONG TERM GOAL #5   Title  Pt. will increase activity tolerance to be able to tolerate 20 min. of ADL/IADL activity without rest breaks.     Baseline  Eval: Pt. tolerate 5-10 min. of activity.    Time  12    Period  Weeks    Status  New    Target Date  06/23/18            Plan - 04/06/18 0906    Clinical Impression Statement Pt. presents with limited activity tolerance. Pt. required multiple restbreaks on the [employer], and during each task. Pt. continues to work on improving UE strength, and coordination skills. Pt. continues to work on improving LUE hand function skills, translatory movements of the hand, and thumb opposition for improved functional use during ADL, and IADL tasks.   Occupational performance deficits (Please refer to evaluation for details):  ADL's;IADL's    Current Impairments/barriers affecting progress:  Positive  Indicators: age, motivation, family support Negative Indicators: multiple comorbidities.    OT Frequency  1x / week    OT Duration  12 weeks    OT Treatment/Interventions  Self-care/ADL training;Therapeutic exercise;Therapeutic activities;Energy conservation;Neuromuscular education;Patient/family education    Clinical Decision Making  Several treatment options, min-mod task modification necessary    Consulted and Agree with Plan of Care  Patient       Patient will benefit from skilled therapeutic intervention in order to improve the following deficits and impairments:  Decreased coordination, Impaired UE functional use, Decreased endurance, Decreased activity tolerance, Decreased range  of motion, Pain, Decreased strength  Visit Diagnosis: Muscle weakness (generalized)  Other lack of coordination    Problem List Patient Active Problem List   Diagnosis Date Noted  . HLD (  hyperlipidemia) 02/27/2018  . Stroke ([place]) 02/27/2018  . GERD (gastroesophageal reflux disease) 02/27/2018  . Essential hypertension 02/27/2018  . 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 Neurocognitive disorder   . Left foot drop   . Benign essential HTN   . Hemiparesis affecting nondominant side as late effect of cerebrovascular accident (CVA) ([place]) 07/18/2016  . Right basal ganglia embolic stroke ([place]) 33/61/2244  . CVA (cerebral infarction) 07/15/2016  . TIA (transient ischemic attack) 07/14/2016  . HTN, goal below 140/80 07/14/2016  . Left facial numbness 07/14/2016  . Weakness of left side of body 07/14/2016    Harrel Carina, MS, OTR/L 04/06/2018, 9:13 AM  [place] MAIN [facility] SERVICES [address] [place], [place], [address] Phone: [phone]   Fax:  [phone]  Name: Jason Oconnor MRN: [id number] Date of Birth: 01-27-1956

## 2018-04-13 ENCOUNTER — Ambulatory Visit: Payer: 59 | Admitting: Physical Therapy

## 2018-04-13 ENCOUNTER — Encounter: Payer: 59 | Admitting: Occupational Therapy

## 2018-04-14 ENCOUNTER — Encounter: Payer: Self-pay | Admitting: Occupational Therapy

## 2018-04-14 ENCOUNTER — Encounter: Payer: Self-pay | Admitting: Physical Therapy

## 2018-04-14 ENCOUNTER — Other Ambulatory Visit: Payer: Self-pay

## 2018-04-14 ENCOUNTER — Ambulatory Visit: Payer: 59 | Admitting: Physical Therapy

## 2018-04-14 ENCOUNTER — Ambulatory Visit: Payer: 59 | Admitting: Occupational Therapy

## 2018-04-14 DIAGNOSIS — R2681 Unsteadiness on feet: Secondary | ICD-10-CM

## 2018-04-14 DIAGNOSIS — M6281 Muscle weakness (generalized): Secondary | ICD-10-CM

## 2018-04-14 DIAGNOSIS — R278 Other lack of coordination: Secondary | ICD-10-CM

## 2018-04-14 DIAGNOSIS — R2689 Other abnormalities of gait and mobility: Secondary | ICD-10-CM

## 2018-04-14 NOTE — Therapy (Signed)
[place] MAIN [facility] SERVICES [address] [place], [place], [address] Phone: [phone]   Fax:  [phone]  Physical Therapy Treatment  Patient Details  Name: Jason Oconnor MRN: [id number] Date of Birth: 07-Mar-1956 Referring Provider: Dr. Sharol Given   Encounter Date: 04/14/2018  PT End of Session - 04/14/18 0927    Visit Number  3    Number of Visits  25    Date for PT Re-Evaluation  06/22/18    PT Start Time  0930    PT Stop Time  1012    PT Time Calculation (min)  42 min    Equipment Utilized During Treatment  Gait belt    Activity Tolerance  Patient tolerated treatment well    Behavior During Therapy  [facility] for tasks assessed/performed       Past Medical History:  Diagnosis Date  . Bone marrow disease   . High cholesterol   . Hypertension   . Stroke [facility])     Past Surgical History:  Procedure Laterality Date  . ELBOW SURGERY Right    Tendinitis    There were no vitals filed for this visit.  Subjective Assessment - 04/14/18 0934    Subjective  Pt reports he is not having any discomfort in his L shoulder today.  He does still have pain but only when he raises it up overhead.      Patient is accompained by:  -- wife    Pertinent History  Patient states that he has chronic mild left-sided weakness and mild numbness from previous stroke. In the past 2 days, he has been feeling worsening weakness in left arm and leg. No vision change, hearing loss. No slurred speech, facial droop. He also has dizziness. Patient denies chest pain, shortness of breath, cough, fever or chills. No nausea, vomiting, diarrhea, abdominal pain, symptoms of UTI.He has a foot drop for which he uses a brace. : he has had at least two episodes of a stroke and the first was Aug 2017 and then April 5. The recent stroke was associted with head pressure and then some left sided weakness in the left arm and it is better. They called the Neurologist and he had an MRI  Saturday and there was not change in the MRI from April 5. He had had a shaking spell and weakness last week with a weird feeling in his chest for a few minutes and then it went away. He has been monitored and he has not had afib. Since he has four different stroke sites in the brain it is wondered if he may have atrial fib.     Limitations  Walking    How long can you stand comfortably?  5 mins    How long can you walk comfortably?  5 mins    Diagnostic tests  See history    Patient Stated Goals  "Get back as close as I can to normal." Pt would to be able to play golf. Pt would like to return to some kind of work.     Currently in Pain?  No/denies        TREATMENT   BP 147/90, SpO2 99%, pulse 68 in sitting at start of session   Matrix resisted walking fwd/ bwd/left/right 22.5 lbs with cues to control his stepping on the return x 3 reps each direction   Left heel raises with UE support x 20   Fwd/retro tandem walk on long airex x 12 laps, pt  requires intermittent UE assist   Ambulating in hallway with random changes in directions per verbal cues forward/back/left/right   Ambulating in hallway with vertical and horizontal head turns   Noted dec L hip F and knee F when ambulating in hallway   Step ups to 6" step with L foot to fatigue x2 sets (added to HEP)   Seated L DF with RTB 3x20 (added to HEP)   Rocker board tapping fwd/ bwd x 20                         PT Education - 04/14/18 0927    Education provided  Yes    Education Details  Exercise technique    Person(s) Educated  Patient    Methods  Explanation;Demonstration;Verbal cues    Comprehension  Verbalized understanding;Returned demonstration;Verbal cues required;Need further instruction          PT Long Term Goals - 03/30/18 1356      PT LONG TERM GOAL #1   Title  Pt will be independent with HEP in order to improve strength and balance in order to decrease fall risk and improve function at  home and work.    Baseline  10/30/16: Mod-min A for exericise progression/ performance 12/18/16: Requires minA for technique with exercise; 02/21/17: modA-maxA and cueing for muscle activation    Time  12    Period  Weeks    Status  New    Target Date  06/22/18      PT LONG TERM GOAL #2   Title  Pt will improve BERG by at least 3 points in order to demonstrate clinically significant improvement in balance.    Baseline  08/07/16: 49/56 10/02/16: 53/56 03/30/18 :52/56     Time  8    Period  Weeks    Status  New    Target Date  06/22/18      PT LONG TERM GOAL #3   Title  Pt will decrease TUG to below 14 seconds in order to demonstrate decreased fall risk     Baseline  08/07/16: 21.9 seconds; 10/02/16: 12.3     Time  8    Period  Weeks    Status  Achieved      PT LONG TERM GOAL #4   Title  Pt will decrease 5TSTS by at least 3 seconds in order to demonstrate clinically significant improvement in LE strength    Baseline  08/07/16: 18.5 seconds; 10/02/16: 14.3    Time  8    Period  Weeks    Status  Achieved      PT LONG TERM GOAL #5   Title  Pt will increase 10MWT by at least 0.13 m/s in order to demonstrate clinically significant improvement in community ambulation    Baseline  08/07/16: 0.53 m/s 10/02/16: .97 m/s    Time  12    Period  Weeks    Status  New    Target Date  06/22/18      PT LONG TERM GOAL #6   Title  Pt will be able to swing a golf club safely in order to complete 9 holes of golf    Baseline  10/30/16: Unable to swing golf club safely 12/18/16: unable to swing golf club; 02/21/17: unable to swing golf club    Time  12    Period  Weeks    Status  New 06/22/18      PT LONG TERM GOAL #  7   Title  Pt will improve 6MWT by at least 184ft  to demonstrate improved aerobic capacity/endurance    Baseline  582 ft 10/02/16: 118ft    Time  12    Period  Weeks    Status  New 06/22/18      PT LONG TERM GOAL #8   Title  Patient will improve DGI by 4 points to demonstrate improvement in  dynamic gait/balance and decrease fall risk    Baseline  10/02/16: 19/24; 10/30/16: 21/24 12/18/16: 23/24    Time  6    Period  Weeks    Status  Achieved      PT LONG TERM GOAL  #9   TITLE  Patient will demonstrate ability to perform single leg stance for >10 sec bilaterally to better be able to climb ladders    Baseline  L LE SLS: 3 sec; L LE SLS: 4sec    Time  8    Period  Weeks    Status  New 06/22/18      PT LONG TERM GOAL  #10   TITLE  Patient will be able to tandem walk 1ft without loss of balance to better perform dynamic balance activities to perform occupational duties.    Baseline  Requires stepping responses to maintain balance with tandem walking; 02/21/17: Requires 4 step downs to maintain balance    Time  12    Period  Weeks    Status  New 06/22/18      PT LONG TERM GOAL  #11   TITLE  Pt will demonstrate 3/5 MMT strength in ankle eversion, toe ext and dorsiflexion to allow for the performance of ankle strategies to maintain balance    Baseline  1+/5: toe ext; dorsiflexion 2+/5; ankle eversion: 1+/5    Time  12    Period  Weeks    Status  New 06/22/18            Plan - 04/14/18 0937    Clinical Impression Statement  Pt demonstrated mild instability and instances of poor control when performing resisted walking.  Noted that pt demonstrated dec L hip flexion and knee flexion when ambulating and thus had pt perform toe taps up to 6" step with L foot to improve the strength and endurance (had pt perform to fatigue) to translate over to ambulating.  Pt reported his wife is not able to assist his with his DF exercise at home anymore due to back pain.  Pt instructed in and pt performed L DF with RTB and this was added to HEP. Pt will benefit from continued skilled PT interventions for improved strength and gait mechanics.     Rehab Potential  Good    PT Frequency  2x / week    PT Duration  8 weeks    PT Treatment/Interventions  Aquatic Therapy;Manual techniques;Balance  training;Neuromuscular re-education;Therapeutic activities;Therapeutic exercise;Gait training;Stair training;Orthotic Fit/Training;Dry needling    PT Next Visit Plan  balance training    PT Home Exercise Plan  heel raises, wall squats, seated L DF with RTB, L toe taps up to 6" step    Consulted and Agree with Plan of Care  Patient       Patient will benefit from skilled therapeutic intervention in order to improve the following deficits and impairments:  Abnormal gait, Decreased balance, Decreased endurance, Decreased mobility, Difficulty walking, Decreased activity tolerance, Decreased coordination, Decreased strength  Visit Diagnosis: Muscle weakness (generalized)  Other lack of coordination  Unsteadiness on  feet  Other abnormalities of gait and mobility     Problem List Patient Active Problem List   Diagnosis Date Noted  . HLD (hyperlipidemia) 02/27/2018  . Stroke ([place]) 02/27/2018  . GERD (gastroesophageal reflux disease) 02/27/2018  . Essential hypertension 02/27/2018  . 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 Neurocognitive disorder   . Left foot drop   . Benign essential HTN   . Hemiparesis affecting nondominant side as late effect of cerebrovascular accident (CVA) ([place]) 07/18/2016  . Right basal ganglia embolic stroke ([place]) 37/48/2707  . CVA (cerebral infarction) 07/15/2016  . TIA (transient ischemic attack) 07/14/2016  . HTN, goal below 140/80 07/14/2016  . Left facial numbness 07/14/2016  . Weakness of left side of body 07/14/2016    Collie Siad PT, DPT 04/14/2018, 10:12 AM  [place] MAIN [facility] SERVICES [address] [place], [place], [address] Phone: [phone]   Fax:  [phone]  Name: Jason Oconnor MRN: [id number] Date of Birth: [birth year]

## 2018-04-14 NOTE — Therapy (Signed)
[place] MAIN [facility] SERVICES [address] [place], [place], [address] Phone: [phone]   Fax:  [phone]  Occupational Therapy Treatment  Patient Details  Name: Jason Oconnor MRN: [id number] Date of Birth: 1956/09/02 Referring Provider: Dr. Sharol Given   Encounter Date: 04/14/2018  OT End of Session - 04/14/18 0836    Visit Number  3    Number of Visits  24    Date for OT Re-Evaluation  06/23/18    Authorization Time Period  40 visit limit combined    OT Start Time  0830    OT Stop Time  0915    OT Time Calculation (min)  45 min    Activity Tolerance  Patient tolerated treatment well    Behavior During Therapy  [facility] for tasks assessed/performed       Past Medical History:  Diagnosis Date  . Bone marrow disease   . High cholesterol   . Hypertension   . Stroke [facility])     Past Surgical History:  Procedure Laterality Date  . ELBOW SURGERY Right    Tendinitis    There were no vitals filed for this visit.  Subjective Assessment - 04/14/18 0834    Subjective   Pt. reports he doesnt feel as shakey this morning.    Pertinent History  Pt. is a 62 y.o. male wwith a history of a CVA. Pt. was admitted to [facility] on 02/27/2018 with a new onset of CVA, and left sided weakness. Pt. was admitted for one night. Imaging revealed: Acute 4 mm RIGHT inferior cerebellar infarct. New nonacute subcentimeter Left Inferior Cerebellar Infarct. Old Right Basal Ganglia Infarct. Remote Perforator Infarct affecting the right putemen, and corona..    Patient Stated Goals  To get back to doing as much as he can without fatigue    Currently in Pain?  Yes       OT TREATMENT    Neuro muscular re-education:  Pt. worked on LUE grasping 1" resistive cubes alternating thumb opposition to the tip of the 2nd through 5th digits while the board is placed at a vertical angle. Pt. worked on pressing the cubes back into place while alternating isolated 2nd  through 5th digit extension. Pt. performed [facility] skills training to improve speed and dexterity needed for ADL tasks and writing. Pt. demonstrated grasping 1 inch sticks,  inch cylindrical collars, and  inch flat washers on the [employer]urdue pegboard. Pt. performed grasping each item with her 2nd digit and thumb, and storing them in the palm. Pt. was able to grasp and store sticks only in the palm of his hand. Pt. had difficulty with translatory movements moving the sticks from the to the tip of his 2nd digit and thumb.Pt. worked on removing the washers, and collars alternating thumb opposition to the tip of his 2nd , and 3rd digits.  Therapeutic Exercise:  Pt. performed gross gripping with grip strengthener. Pt. worked on sustaining grip while grasping pegs and reaching at various heights. Gripper was placed in the 4th resistive slot with the white resistive spring. Pt. worked on pinch strengthening in the left hand for lateral, and 3pt. pinch using yellow, red, green, and blue resistive clips. Pt. worked on placing the clips at various vertical and horizontal angles. Tactile and verbal cues were required for eliciting the desired movement. Pt. worked on  OT Education - 04/14/18 0835    Education provided  Yes    Education Details  UE functioning    Person(s) Educated  Patient    Methods  Explanation    Comprehension  Verbalized understanding          OT Long Term Goals - 03/31/18 1458      OT LONG TERM GOAL #1   Title  Pt. will improve LUE strength by 1 mm grade to assist with ADLs, and IADLs.     Baseline  Eval: Limited LUE strength    Time  12    Period  Weeks    Status  New    Target Date  06/23/18      OT LONG TERM GOAL #2   Title  Pt. will improve left grip strength by 10# to be able to hold a full glass.    Baseline  Eval: Left grip strength: 40#    Time  12    Period  Weeks    Status  New    Target Date  06/23/18      OT LONG TERM  GOAL #3   Title  Pt. will improve left hand [facility] skills by 5 sec. to be able to perform buttoning    Baseline  Eval: Left hand limited [place], limited buttoning    Time  12    Period  Weeks    Status  New    Target Date  06/23/18      OT LONG TERM GOAL #4   Title  Pt. will improve 3pt. pinch strength to be able to clip nails.    Baseline  Eval: Pt. has difficulty    Time  12    Period  Weeks    Status  New    Target Date  06/23/18      OT LONG TERM GOAL #5   Title  Pt. will increase activity tolerance to be able to tolerate 20 min. of ADL/IADL activity without rest breaks.     Baseline  Eval: Pt. tolerate 5-10 min. of activity.    Time  12    Period  Weeks    Status  New    Target Date  06/23/18            Plan - 04/14/18 0836    Clinical Impression Statement  Pt. is trying to engage his LUE into as many tasks as passible at home. Pt. has been trying to help his daughter with home repairs. Pt. continues to present with limited LUE strength, and [facility] skills. Pt. presents with limited LUE strength, and coordination skills, and continues to work on improving left hand function for improved functional use during ADLs, and IADLs.   Occupational performance deficits (Please refer to evaluation for details):  ADL's;IADL's    Current Impairments/barriers affecting progress:  Positive  Indicators: age, motivation, family support Negative Indicators: multiple comorbidities.    OT Frequency  1x / week    OT Treatment/Interventions  Self-care/ADL training;Therapeutic exercise;Therapeutic activities;Energy conservation;Neuromuscular education;Patient/family education    Clinical Decision Making  Several treatment options, min-mod task modification necessary    Consulted and Agree with Plan of Care  Patient       Patient will benefit from skilled therapeutic intervention in order to improve the following deficits and impairments:  Decreased coordination, Impaired UE functional use, Decreased  endurance, Decreased activity tolerance, Decreased range of motion, Pain, Decreased strength  Visit Diagnosis: Muscle weakness (generalized)  Other lack of  coordination    Problem List Patient Active Problem List   Diagnosis Date Noted  . HLD (hyperlipidemia) 02/27/2018  . Stroke ([place]) 02/27/2018  . GERD (gastroesophageal reflux disease) 02/27/2018  . Essential hypertension 02/27/2018  . 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 Neurocognitive disorder   . Left foot drop   . Benign essential HTN   . Hemiparesis affecting nondominant side as late effect of cerebrovascular accident (CVA) ([place]) 07/18/2016  . Right basal ganglia embolic stroke ([place]) 50/72/2575  . CVA (cerebral infarction) 07/15/2016  . TIA (transient ischemic attack) 07/14/2016  . HTN, goal below 140/80 07/14/2016  . Left facial numbness 07/14/2016  . Weakness of left side of body 07/14/2016    Harrel Carina, MS, OTR/L 04/14/2018, 8:50 AM  [place] MAIN [facility] SERVICES [address] [place], [place], [address] Phone: [phone]   Fax:  [phone]  Name: Terron Merfeld MRN: [id number] Date of Birth: 1956/11/08

## 2018-04-22 ENCOUNTER — Encounter: Payer: Self-pay | Admitting: Physical Therapy

## 2018-04-22 ENCOUNTER — Encounter: Payer: Self-pay | Admitting: Occupational Therapy

## 2018-04-22 ENCOUNTER — Ambulatory Visit: Payer: 59 | Admitting: Occupational Therapy

## 2018-04-22 ENCOUNTER — Ambulatory Visit: Payer: 59 | Admitting: Physical Therapy

## 2018-04-22 DIAGNOSIS — R2681 Unsteadiness on feet: Secondary | ICD-10-CM

## 2018-04-22 DIAGNOSIS — M6281 Muscle weakness (generalized): Secondary | ICD-10-CM

## 2018-04-22 DIAGNOSIS — R2689 Other abnormalities of gait and mobility: Secondary | ICD-10-CM

## 2018-04-22 DIAGNOSIS — R278 Other lack of coordination: Secondary | ICD-10-CM

## 2018-04-22 DIAGNOSIS — I639 Cerebral infarction, unspecified: Secondary | ICD-10-CM

## 2018-04-22 NOTE — Therapy (Signed)
[place] MAIN [facility] SERVICES [address] [place], [place], [address] Phone: [phone]   Fax:  [phone]  Physical Therapy Treatment  Patient Details  Name: Jason Oconnor MRN: [id number] Date of Birth: [birth year] Referring Provider: Dr. Sharol Given   Encounter Date: 04/22/2018  PT End of Session - 04/22/18 0851    Visit Number  4    Number of Visits  25    Date for PT Re-Evaluation  06/22/18    PT Start Time  0845    PT Stop Time  0930    PT Time Calculation (min)  45 min    Equipment Utilized During Treatment  Gait belt    Activity Tolerance  Patient tolerated treatment well    Behavior During Therapy  [facility] for tasks assessed/performed       Past Medical History:  Diagnosis Date  . Bone marrow disease   . High cholesterol   . Hypertension   . Stroke [facility])     Past Surgical History:  Procedure Laterality Date  . ELBOW SURGERY Right    Tendinitis    There were no vitals filed for this visit.  Subjective Assessment - 04/22/18 0849    Subjective  Patinet reports that he was painting at his daughters house the rails and he aggravated his right hip.     Patient is accompained by:  -- wife    Pertinent History  Patient states that he has chronic mild left-sided weakness and mild numbness from previous stroke. In the past 2 days, he has been feeling worsening weakness in left arm and leg. No vision change, hearing loss. No slurred speech, facial droop. He also has dizziness. Patient denies chest pain, shortness of breath, cough, fever or chills. No nausea, vomiting, diarrhea, abdominal pain, symptoms of UTI.He has a foot drop for which he uses a brace. : he has had at least two episodes of a stroke and the first was Aug 2017 and then April 5. The recent stroke was associted with head pressure and then some left sided weakness in the left arm and it is better. They called the Neurologist and he had an MRI Saturday and there was not change in  the MRI from April 5. He had had a shaking spell and weakness last week with a weird feeling in his chest for a few minutes and then it went away. He has been monitored and he has not had afib. Since he has four different stroke sites in the brain it is wondered if he may have atrial fib.     Limitations  Walking    How long can you stand comfortably?  5 mins    How long can you walk comfortably?  5 mins    Diagnostic tests  See history    Patient Stated Goals  "Get back as close as I can to normal." Pt would to be able to play golf. Pt would like to return to some kind of work.     Currently in Pain?  Yes    Pain Score  1     Pain Location  Hip    Pain Orientation  Right    Pain Descriptors / Indicators  Aching    Pain Type  Acute pain    Pain Onset  1 to 4 weeks ago    Pain Frequency  Rarely    Aggravating Factors   sitting    Pain Relieving Factors  walking    Effect  of Pain on Daily Activities  no effect          NEUROMUSCULAR RE-EDUCATION Tm walking . 3 miles / hour elevation 1 x 2 mins left and 2 mins right , able to turn with moving TM but motor control is decreased with left side leading 2 disk tandem x 2 mins with weight shifting fwd/bwd Toe tapping 6 inch stool without UE assist, cues for technique Tandem gait in // bars x 4 laps , posture correction cues Side stepping on blue  foam balance beam x 5 lengths of the parallel bars with posture correction cues Hamstring knee flex with theraband standing x 15   Fwd/retro side stepping on long airex with no UE support x 5 laps; mod verbal cues to decrease UE support, decreased step length with R throughout, mod verbal cues to increase R step length; CGA - min A for steadiness throughout Matrix fwd/bwd, side stepping 22. 5 lbs x 5 each direction    Patient shows good control when pushing off lead leg and is able to do so with adequate power during side stepping on TM  Patient required verbal and tactile cueing during dynamic  standing balance activities in order to maintain center of gravity. Patient required CGA during all dynamic standing balance activities                  PT Education - 04/22/18 0851    Education provided  Yes    Education Details  Review HPE    Person(s) Educated  Patient    Methods  Explanation;Demonstration    Comprehension  Verbalized understanding;Returned demonstration;Verbal cues required          PT Long Term Goals - 03/30/18 1356      PT LONG TERM GOAL #1   Title  Pt will be independent with HEP in order to improve strength and balance in order to decrease fall risk and improve function at home and work.    Baseline  10/30/16: Mod-min A for exericise progression/ performance 12/18/16: Requires minA for technique with exercise; 02/21/17: modA-maxA and cueing for muscle activation    Time  12    Period  Weeks    Status  New    Target Date  06/22/18      PT LONG TERM GOAL #2   Title  Pt will improve BERG by at least 3 points in order to demonstrate clinically significant improvement in balance.    Baseline  08/07/16: 49/56 10/02/16: 53/56 03/30/18 :52/56     Time  8    Period  Weeks    Status  New    Target Date  06/22/18      PT LONG TERM GOAL #3   Title  Pt will decrease TUG to below 14 seconds in order to demonstrate decreased fall risk     Baseline  08/07/16: 21.9 seconds; 10/02/16: 12.3     Time  8    Period  Weeks    Status  Achieved      PT LONG TERM GOAL #4   Title  Pt will decrease 5TSTS by at least 3 seconds in order to demonstrate clinically significant improvement in LE strength    Baseline  08/07/16: 18.5 seconds; 10/02/16: 14.3    Time  8    Period  Weeks    Status  Achieved      PT LONG TERM GOAL #5   Title  Pt will increase 10MWT by at least 0.13 m/s  in order to demonstrate clinically significant improvement in community ambulation    Baseline  08/07/16: 0.53 m/s 10/02/16: .97 m/s    Time  12    Period  Weeks    Status  New    Target Date   06/22/18      PT LONG TERM GOAL #6   Title  Pt will be able to swing a golf club safely in order to complete 9 holes of golf    Baseline  10/30/16: Unable to swing golf club safely 12/18/16: unable to swing golf club; 02/21/17: unable to swing golf club    Time  12    Period  Weeks    Status  New 06/22/18      PT LONG TERM GOAL #7   Title  Pt will improve 6MWT by at least 182ft  to demonstrate improved aerobic capacity/endurance    Baseline  582 ft 10/02/16: 1163ft    Time  12    Period  Weeks    Status  New 06/22/18      PT LONG TERM GOAL #8   Title  Patient will improve DGI by 4 points to demonstrate improvement in dynamic gait/balance and decrease fall risk    Baseline  10/02/16: 19/24; 10/30/16: 21/24 12/18/16: 23/24    Time  6    Period  Weeks    Status  Achieved      PT LONG TERM GOAL  #9   TITLE  Patient will demonstrate ability to perform single leg stance for >10 sec bilaterally to better be able to climb ladders    Baseline  L LE SLS: 3 sec; L LE SLS: 4sec    Time  8    Period  Weeks    Status  New 06/22/18      PT LONG TERM GOAL  #10   TITLE  Patient will be able to tandem walk 28ft without loss of balance to better perform dynamic balance activities to perform occupational duties.    Baseline  Requires stepping responses to maintain balance with tandem walking; 02/21/17: Requires 4 step downs to maintain balance    Time  12    Period  Weeks    Status  New 06/22/18      PT LONG TERM GOAL  #11   TITLE  Pt will demonstrate 3/5 MMT strength in ankle eversion, toe ext and dorsiflexion to allow for the performance of ankle strategies to maintain balance    Baseline  1+/5: toe ext; dorsiflexion 2+/5; ankle eversion: 1+/5    Time  12    Period  Weeks    Status  New 06/22/18            Plan - 04/22/18 4098    Clinical Impression Statement  Patient required min verbal cues to perform matrix fwd/ bwd/ side stepping with weights for posture and control and required verbal and  tactile cues during all dynamic standing balance activities. Patient demonstrated decreased gait speed with ambulation without AD.  Patient will continue to benefit from skilled therapy in order to improve strength, dynamic standing balance and increase gait speed to reduce risk for falls    Rehab Potential  Good    PT Frequency  2x / week    PT Duration  8 weeks    PT Treatment/Interventions  Aquatic Therapy;Manual techniques;Balance training;Neuromuscular re-education;Therapeutic activities;Therapeutic exercise;Gait training;Stair training;Orthotic Fit/Training;Dry needling    PT Next Visit Plan  balance training    PT Home Exercise  Plan  heel raises, wall squats, seated L DF with RTB, L toe taps up to 6" step    Consulted and Agree with Plan of Care  Patient       Patient will benefit from skilled therapeutic intervention in order to improve the following deficits and impairments:  Abnormal gait, Decreased balance, Decreased endurance, Decreased mobility, Difficulty walking, Decreased activity tolerance, Decreased coordination, Decreased strength  Visit Diagnosis: Muscle weakness (generalized)  Other lack of coordination  Unsteadiness on feet  Other abnormalities of gait and mobility  Right basal ganglia embolic stroke [facility])     Problem List Patient Active Problem List   Diagnosis Date Noted  . HLD (hyperlipidemia) 02/27/2018  . Stroke ([place]) 02/27/2018  . GERD (gastroesophageal reflux disease) 02/27/2018  . Essential hypertension 02/27/2018  . 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 Neurocognitive disorder   . Left foot drop   . Benign essential HTN   . Hemiparesis affecting nondominant side as late effect of cerebrovascular accident (CVA) ([place]) 07/18/2016  . Right basal ganglia embolic stroke ([place]) 54/27/0623  . CVA (cerebral infarction) 07/15/2016  . TIA (transient ischemic attack) 07/14/2016  . HTN, goal below 140/80 07/14/2016  . Left  facial numbness 07/14/2016  . Weakness of left side of body 07/14/2016    Alanson Puls , [place] DPT 04/22/2018, 8:54 AM  [place] MAIN [facility] SERVICES [address] [place], [place], [address] Phone: [phone]   Fax:  [phone]  Name: Sinan Tuch MRN: [id number] Date of Birth: [birth year]

## 2018-04-22 NOTE — Therapy (Addendum)
[place] MAIN [facility] SERVICES [address] [place], [place], [address] Phone: [phone]   Fax:  [phone]  Occupational Therapy Treatment  Patient Details  Name: Jason Oconnor MRN: [id number] Date of Birth: [birth year] Referring Provider: Dr. Sharol Given   Encounter Date: 04/22/2018  OT End of Session - 04/30/18 0949    Visit Number  5    Number of Visits  24    Date for OT Re-Evaluation  06/23/18    Authorization Time Period  40 visit limit combined    OT Start Time  0930       Past Medical History:  Diagnosis Date  . Bone marrow disease   . High cholesterol   . Hypertension   . Stroke [facility])     Past Surgical History:  Procedure Laterality Date  . ELBOW SURGERY Right    Tendinitis    There were no vitals filed for this visit.  Subjective Assessment - 04/30/18 0931    Subjective   Patient is on a heart monitor right now, he has had it for 29 days and may end up having a permanent monitor placed into chest.  Tired from PT, yesterday he had a good day, 6,000 steps.     Pertinent History  Pt. is a 62 y.o. male wwith a history of a CVA. Pt. was admitted to [facility] on 02/27/2018 with a new onset of CVA, and left sided weakness. Pt. was admitted for one night. Imaging revealed: Acute 4 mm RIGHT inferior cerebellar infarct. New nonacute subcentimeter Left Inferior Cerebellar Infarct. Old Right Basal Ganglia Infarct. Remote Perforator Infarct affecting the right putemen, and corona..    Patient Stated Goals  To get back to doing as much as he can without fatigue    Currently in Pain?  No/denies    Pain Score  0-No pain      OT TREATMENT    Neuro muscular re-education:  Pt. worked on left hand [facility] skills grasping, and manipulating nuts, and bolts with vision occluded. Pt. worked grasping, and manipulating the objects of progressively decreasing sizes. Pt. worked on UE motor control, and coordination skills while handling, and  using tools. Pt. worked on Contractor, IT trainer, and screw drivers to remove screws, and bolts of varying sizes. Pt. had to stand to use the large screw driver to increase control. Pt. worked on grasping coins from a tabletop surface, placing them into a resistive container, and pushing them through the slot while isolating his 2nd digit.                                     OT Long Term Goals - 03/31/18 1458      OT LONG TERM GOAL #1   Title  Pt. will improve LUE strength by 1 mm grade to assist with ADLs, and IADLs.     Baseline  Eval: Limited LUE strength    Time  12    Period  Weeks    Status  New    Target Date  06/23/18      OT LONG TERM GOAL #2   Title  Pt. will improve left grip strength by 10# to be able to hold a full glass.    Baseline  Eval: Left grip strength: 40#    Time  12    Period  Weeks    Status  New  Target Date  06/23/18      OT LONG TERM GOAL #3   Title  Pt. will improve left hand [facility] skills by 5 sec. to be able to perform buttoning    Baseline  Eval: Left hand limited [place], limited buttoning    Time  12    Period  Weeks    Status  New    Target Date  06/23/18      OT LONG TERM GOAL #4   Title  Pt. will improve 3pt. pinch strength to be able to clip nails.    Baseline  Eval: Pt. has difficulty    Time  12    Period  Weeks    Status  New    Target Date  06/23/18      OT LONG TERM GOAL #5   Title  Pt. will increase activity tolerance to be able to tolerate 20 min. of ADL/IADL activity without rest breaks.     Baseline  Eval: Pt. tolerate 5-10 min. of activity.    Time  12    Period  Weeks    Status  New    Target Date  06/23/18            Plan - 04/30/18 0949    Clinical Impression     Occupational performance deficits (Please refer to evaluation for details): Pt. reports he has been busy helping his daughter make house repairs. Pt. reports being fatigued today following PT. Pt. reports  that he gets tired easily. Pt. continues to work on improving LUE strength, and FM coordination skills for improved ADL, and IADL functioning  ADL's;IADL's    Rehab Potential  Good    Current Impairments/barriers affecting progress:  Positive  Indicators: age, motivation, family support Negative Indicators: multiple comorbidities.    OT Frequency  1x / week    OT Duration  12 weeks    OT Treatment/Interventions  Self-care/ADL training;Therapeutic exercise;Therapeutic activities;Energy conservation;Neuromuscular education;Patient/family education    Consulted and Agree with Plan of Care  Patient       Patient will benefit from skilled therapeutic intervention in order to improve the following deficits and impairments:  Decreased coordination, Impaired UE functional use, Decreased endurance, Decreased activity tolerance, Decreased range of motion, Pain, Decreased strength  Visit Diagnosis: Muscle weakness (generalized)  Other lack of coordination    Problem List Patient Active Problem List   Diagnosis Date Noted  . HLD (hyperlipidemia) 02/27/2018  . Stroke ([place]) 02/27/2018  . GERD (gastroesophageal reflux disease) 02/27/2018  . Essential hypertension 02/27/2018  . 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 Neurocognitive disorder   . Left foot drop   . Benign essential HTN   . Hemiparesis affecting nondominant side as late effect of cerebrovascular accident (CVA) ([place]) 07/18/2016  . Right basal ganglia embolic stroke ([place]) 33/00/7622  . CVA (cerebral infarction) 07/15/2016  . TIA (transient ischemic attack) 07/14/2016  . HTN, goal below 140/80 07/14/2016  . Left facial numbness 07/14/2016  . Weakness of left side of body 07/14/2016    Harrel Carina, MS, OTR/L 04/30/2018, 6:22 PM  [place] MAIN [facility] SERVICES [address] [place], [place], [address] Phone: [phone]   Fax:  [phone]  Name: Jason Oconnor MRN: [id number] Date of Birth: 02-22-1956

## 2018-04-27 ENCOUNTER — Ambulatory Visit: Payer: 59 | Admitting: Cardiovascular Disease

## 2018-04-29 ENCOUNTER — Ambulatory Visit: Payer: 59 | Admitting: Neurology

## 2018-04-30 ENCOUNTER — Encounter: Payer: Self-pay | Admitting: Physical Therapy

## 2018-04-30 ENCOUNTER — Ambulatory Visit: Payer: 59 | Attending: Internal Medicine | Admitting: Physical Therapy

## 2018-04-30 ENCOUNTER — Encounter: Payer: Self-pay | Admitting: Occupational Therapy

## 2018-04-30 ENCOUNTER — Ambulatory Visit: Payer: 59 | Admitting: Occupational Therapy

## 2018-04-30 DIAGNOSIS — M6281 Muscle weakness (generalized): Secondary | ICD-10-CM | POA: Diagnosis present

## 2018-04-30 DIAGNOSIS — I639 Cerebral infarction, unspecified: Secondary | ICD-10-CM | POA: Insufficient documentation

## 2018-04-30 DIAGNOSIS — R278 Other lack of coordination: Secondary | ICD-10-CM | POA: Insufficient documentation

## 2018-04-30 DIAGNOSIS — R2689 Other abnormalities of gait and mobility: Secondary | ICD-10-CM | POA: Insufficient documentation

## 2018-04-30 DIAGNOSIS — R2681 Unsteadiness on feet: Secondary | ICD-10-CM | POA: Insufficient documentation

## 2018-04-30 NOTE — Therapy (Signed)
[place] MAIN [facility] SERVICES [address] [place], [place], [address] Phone: [phone]   Fax:  [phone]  Physical Therapy Treatment  Patient Details  Name: Jason Oconnor MRN: [id number] Date of Birth: [birth year] Referring Provider: Dr. Sharol Given   Encounter Date: 04/30/2018  PT End of Session - 04/30/18 0858    Visit Number  5    Number of Visits  25    Date for PT Re-Evaluation  06/22/18    PT Start Time  0950    PT Stop Time  1030    PT Time Calculation (min)  40 min    Equipment Utilized During Treatment  Gait belt    Activity Tolerance  Patient tolerated treatment well    Behavior During Therapy  [facility] for tasks assessed/performed       Past Medical History:  Diagnosis Date  . Bone marrow disease   . High cholesterol   . Hypertension   . Stroke [facility])     Past Surgical History:  Procedure Laterality Date  . ELBOW SURGERY Right    Tendinitis    There were no vitals filed for this visit.  Subjective Assessment - 04/30/18 0857    Subjective  Patinet reports that he is walking 6000 steps and he used his golf club and it felt better.    Patient is accompained by:  -- wife    Pertinent History  Patient states that he has chronic mild left-sided weakness and mild numbness from previous stroke. In the past 2 days, he has been feeling worsening weakness in left arm and leg. No vision change, hearing loss. No slurred speech, facial droop. He also has dizziness. Patient denies chest pain, shortness of breath, cough, fever or chills. No nausea, vomiting, diarrhea, abdominal pain, symptoms of UTI.He has a foot drop for which he uses a brace. : he has had at least two episodes of a stroke and the first was Aug 2017 and then April 5. The recent stroke was associted with head pressure and then some left sided weakness in the left arm and it is better. They called the Neurologist and he had an MRI Saturday and there was not change in the MRI from  April 5. He had had a shaking spell and weakness last week with a weird feeling in his chest for a few minutes and then it went away. He has been monitored and he has not had afib. Since he has four different stroke sites in the brain it is wondered if he may have atrial fib.     Limitations  Walking    How long can you stand comfortably?  5 mins    How long can you walk comfortably?  5 mins    Diagnostic tests  See history    Patient Stated Goals  "Get back as close as I can to normal." Pt would to be able to play golf. Pt would like to return to some kind of work.     Currently in Pain?  No/denies    Pain Score  0-No pain    Pain Onset  1 to 4 weeks ago    Multiple Pain Sites  No      Treatment:  Octane fitness x 5 Level 5  Squats on wall with 5 sec hold x 15  SLS with 1 rail assist 10 sec hold x2 bilaterally;   Tandem stance modified unsupported 10 sec hold x2 each foot in front; demonstrates good stance  control with minimal lateral loss of balance/unsteadiness  Single leg bride with RLE 6 inchs off the mat x 15, cues to keep RLE straight  Kneeling with bwd ext of hips for eccentric lengthening of quad to fatigue with 3 sec hold x 10   Standing on airex , lunge to BOSU ball left and right x 15 reps , cues for posture and speed  2 disc side by side  and balance with head turns left and right feet apart and feet together,; cues for keeping balance and using ankle and hips to maintain center of gravity   tandem standing on 1/2 foam  ; cues to maintain balance and posture correction,with min A for safety and min VCs to improve gaze stabilization for less loss of balance  Tilt board fwd/bwd, side to side left and right; cues for posture correction,with min A and cues to improve weight shift for better stance control    Patient needs occasional verbal cueing to improve posture and cueing to correctly perform exercises slowly,                        PT Education  - 04/30/18 0858    Education provided  Yes    Education Details  HEP    Person(s) Educated  Patient    Methods  Explanation;Demonstration;Tactile cues;Verbal cues    Comprehension  Verbalized understanding;Returned demonstration          PT Long Term Goals - 03/30/18 1356      PT LONG TERM GOAL #1   Title  Pt will be independent with HEP in order to improve strength and balance in order to decrease fall risk and improve function at home and work.    Baseline  10/30/16: Mod-min A for exericise progression/ performance 12/18/16: Requires minA for technique with exercise; 02/21/17: modA-maxA and cueing for muscle activation    Time  12    Period  Weeks    Status  New    Target Date  06/22/18      PT LONG TERM GOAL #2   Title  Pt will improve BERG by at least 3 points in order to demonstrate clinically significant improvement in balance.    Baseline  08/07/16: 49/56 10/02/16: 53/56 03/30/18 :52/56     Time  8    Period  Weeks    Status  New    Target Date  06/22/18      PT LONG TERM GOAL #3   Title  Pt will decrease TUG to below 14 seconds in order to demonstrate decreased fall risk     Baseline  08/07/16: 21.9 seconds; 10/02/16: 12.3     Time  8    Period  Weeks    Status  Achieved      PT LONG TERM GOAL #4   Title  Pt will decrease 5TSTS by at least 3 seconds in order to demonstrate clinically significant improvement in LE strength    Baseline  08/07/16: 18.5 seconds; 10/02/16: 14.3    Time  8    Period  Weeks    Status  Achieved      PT LONG TERM GOAL #5   Title  Pt will increase 10MWT by at least 0.13 m/s in order to demonstrate clinically significant improvement in community ambulation    Baseline  08/07/16: 0.53 m/s 10/02/16: .97 m/s    Time  12    Period  Weeks    Status  New    Target Date  06/22/18      PT LONG TERM GOAL #6   Title  Pt will be able to swing a golf club safely in order to complete 9 holes of golf    Baseline  10/30/16: Unable to swing golf club safely  12/18/16: unable to swing golf club; 02/21/17: unable to swing golf club    Time  12    Period  Weeks    Status  New 06/22/18      PT LONG TERM GOAL #7   Title  Pt will improve 6MWT by at least 169ft  to demonstrate improved aerobic capacity/endurance    Baseline  582 ft 10/02/16: 1174ft    Time  12    Period  Weeks    Status  New 06/22/18      PT LONG TERM GOAL #8   Title  Patient will improve DGI by 4 points to demonstrate improvement in dynamic gait/balance and decrease fall risk    Baseline  10/02/16: 19/24; 10/30/16: 21/24 12/18/16: 23/24    Time  6    Period  Weeks    Status  Achieved      PT LONG TERM GOAL  #9   TITLE  Patient will demonstrate ability to perform single leg stance for >10 sec bilaterally to better be able to climb ladders    Baseline  L LE SLS: 3 sec; L LE SLS: 4sec    Time  8    Period  Weeks    Status  New 06/22/18      PT LONG TERM GOAL  #10   TITLE  Patient will be able to tandem walk 25ft without loss of balance to better perform dynamic balance activities to perform occupational duties.    Baseline  Requires stepping responses to maintain balance with tandem walking; 02/21/17: Requires 4 step downs to maintain balance    Time  12    Period  Weeks    Status  New 06/22/18      PT LONG TERM GOAL  #11   TITLE  Pt will demonstrate 3/5 MMT strength in ankle eversion, toe ext and dorsiflexion to allow for the performance of ankle strategies to maintain balance    Baseline  1+/5: toe ext; dorsiflexion 2+/5; ankle eversion: 1+/5    Time  12    Period  Weeks    Status  New 06/22/18            Plan - 04/30/18 0859    Clinical Impression Statement  Patient demonstrates decreased gait speed with deviations and requires verbal and tactile cueing to maintain center of gravity during ambulation.  Patient also requires CGA during all dynamic standing balance activities. Patient requires consistent cueing to maintain correct position during gait activities. Patient  demonstrates difficulty with gait and increased postural sway with complex dynamic balance activities. Patient will continue to benefit from skilled therapy in order to improve dynamic standing balance and increase endurance    Rehab Potential  Good    PT Frequency  2x / week    PT Duration  8 weeks    PT Treatment/Interventions  Aquatic Therapy;Manual techniques;Balance training;Neuromuscular re-education;Therapeutic activities;Therapeutic exercise;Gait training;Stair training;Orthotic Fit/Training;Dry needling    PT Next Visit Plan  balance training    PT Home Exercise Plan  heel raises, wall squats, seated L DF with RTB, L toe taps up to 6" step    Consulted and Agree with Plan of Care  Patient       Patient will benefit from skilled therapeutic intervention in order to improve the following deficits and impairments:  Abnormal gait, Decreased balance, Decreased endurance, Decreased mobility, Difficulty walking, Decreased activity tolerance, Decreased coordination, Decreased strength  Visit Diagnosis: Muscle weakness (generalized)  Other lack of coordination  Unsteadiness on feet  Other abnormalities of gait and mobility  Right basal ganglia embolic stroke [facility])     Problem List Patient Active Problem List   Diagnosis Date Noted  . HLD (hyperlipidemia) 02/27/2018  . Stroke ([place]) 02/27/2018  . GERD (gastroesophageal reflux disease) 02/27/2018  . Essential hypertension 02/27/2018  . 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 Neurocognitive disorder   . Left foot drop   . Benign essential HTN   . Hemiparesis affecting nondominant side as late effect of cerebrovascular accident (CVA) ([place]) 07/18/2016  . Right basal ganglia embolic stroke ([place]) 91/66/0600  . CVA (cerebral infarction) 07/15/2016  . TIA (transient ischemic attack) 07/14/2016  . HTN, goal below 140/80 07/14/2016  . Left facial numbness 07/14/2016  . Weakness of left side of body  07/14/2016    Arelia Sneddon S,PT DPT 04/30/2018, 9:02 AM  [place] MAIN [facility] SERVICES [address] [place], [place], [address] Phone: [phone]   Fax:  [phone]  Name: Prynce Jacober MRN: [id number] Date of Birth: [birth year]

## 2018-05-02 NOTE — Therapy (Signed)
[place] MAIN [facility] SERVICES [address] [place], [place], [address] Phone: [phone]   Fax:  [phone]  Occupational Therapy Treatment  Patient Details  Name: Jason Oconnor MRN: [id number] Date of Birth: 11/22/1956 Referring Provider: Dr. Sharol Given   Encounter Date: 04/30/2018  OT End of Session - 05/02/18 1128    Visit Number  5    Number of Visits  24    Date for OT Re-Evaluation  06/23/18    Authorization Time Period  40 visit limit combined    OT Start Time  0930    OT Stop Time  1015    OT Time Calculation (min)  45 min       Past Medical History:  Diagnosis Date  . Bone marrow disease   . High cholesterol   . Hypertension   . Stroke [facility])     Past Surgical History:  Procedure Laterality Date  . ELBOW SURGERY Right    Tendinitis    There were no vitals filed for this visit.                OT Treatments/Exercises (OP) - 05/02/18 1125      Neurological Re-education Exercises   Other Exercises 1  Patient seen for UB strengthening with use of 5# dowel for shoulder flexion, ABD/ADD, forwards circles, backwards circles, chest press and elbow flexion/extension for 2 sets of 15 repetitions each.  Cues for technique and form.  Rest breaks as needed. Triceps press with 2# overhead for 10 repetitions for one set and then 3# with triceps kickback in standing with left UE.     Other Exercises 2  Patient seen for manipulation of 100 pegboard with left hand with cues for prehension patterns and emphasis on speed and dexterity.               OT Education - 05/02/18 1129    Education provided  Yes    Education Details  strengthening, coordination exercise    Person(s) Educated  Patient    Methods  Explanation;Demonstration;Verbal cues    Comprehension  Returned demonstration;Verbalized understanding          OT Long Term Goals - 03/31/18 1458      OT LONG TERM GOAL #1   Title  Pt. will improve LUE  strength by 1 mm grade to assist with ADLs, and IADLs.     Baseline  Eval: Limited LUE strength    Time  12    Period  Weeks    Status  New    Target Date  06/23/18      OT LONG TERM GOAL #2   Title  Pt. will improve left grip strength by 10# to be able to hold a full glass.    Baseline  Eval: Left grip strength: 40#    Time  12    Period  Weeks    Status  New    Target Date  06/23/18      OT LONG TERM GOAL #3   Title  Pt. will improve left hand [facility] skills by 5 sec. to be able to perform buttoning    Baseline  Eval: Left hand limited [place], limited buttoning    Time  12    Period  Weeks    Status  New    Target Date  06/23/18      OT LONG TERM GOAL #4   Title  Pt. will improve 3pt. pinch strength to be able  to clip nails.    Baseline  Eval: Pt. has difficulty    Time  12    Period  Weeks    Status  New    Target Date  06/23/18      OT LONG TERM GOAL #5   Title  Pt. will increase activity tolerance to be able to tolerate 20 min. of ADL/IADL activity without rest breaks.     Baseline  Eval: Pt. tolerate 5-10 min. of activity.    Time  12    Period  Weeks    Status  New    Target Date  06/23/18            Plan - 05/02/18 1131    Clinical Impression Statement  Patient continues to progress with daily exercises and engagement into tasks in the clinic.  He feels he fatigues easily and is tired after PT session.  Patient requires cues for form with UB exercises and prehension patterns.  Continue to work towards goals to increase safety and independence in daily tasks at home and in the community.      Occupational performance deficits (Please refer to evaluation for details):  ADL's;IADL's    Rehab Potential  Good    Current Impairments/barriers affecting progress:  Positive  Indicators: age, motivation, family support Negative Indicators: multiple comorbidities.    OT Frequency  1x / week    OT Duration  12 weeks    OT Treatment/Interventions  Self-care/ADL  training;Therapeutic exercise;Therapeutic activities;Energy conservation;Neuromuscular education;Patient/family education    Consulted and Agree with Plan of Care  Patient       Patient will benefit from skilled therapeutic intervention in order to improve the following deficits and impairments:  Decreased coordination, Impaired UE functional use, Decreased endurance, Decreased activity tolerance, Decreased range of motion, Pain, Decreased strength  Visit Diagnosis: Muscle weakness (generalized)  Other lack of coordination    Problem List Patient Active Problem List   Diagnosis Date Noted  . HLD (hyperlipidemia) 02/27/2018  . Stroke ([place]) 02/27/2018  . GERD (gastroesophageal reflux disease) 02/27/2018  . Essential hypertension 02/27/2018  . 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 Neurocognitive disorder   . Left foot drop   . Benign essential HTN   . Hemiparesis affecting nondominant side as late effect of cerebrovascular accident (CVA) ([place]) 07/18/2016  . Right basal ganglia embolic stroke ([place]) 24/07/7352  . CVA (cerebral infarction) 07/15/2016  . TIA (transient ischemic attack) 07/14/2016  . HTN, goal below 140/80 07/14/2016  . Left facial numbness 07/14/2016  . Weakness of left side of body 07/14/2016    [name],[name] 05/02/2018, 11:31 AM  [place] MAIN [facility] SERVICES [address] [place], [place], [address] Phone: [phone]   Fax:  [phone]  Name: Alfonzo Arca MRN: [id number] Date of Birth: 10-02-1956

## 2018-05-06 ENCOUNTER — Ambulatory Visit: Payer: 59 | Admitting: Physical Therapy

## 2018-05-06 ENCOUNTER — Encounter: Payer: Self-pay | Admitting: Physical Therapy

## 2018-05-06 ENCOUNTER — Ambulatory Visit: Payer: 59 | Admitting: Occupational Therapy

## 2018-05-06 VITALS — BP 132/80

## 2018-05-06 DIAGNOSIS — M6281 Muscle weakness (generalized): Secondary | ICD-10-CM | POA: Diagnosis not present

## 2018-05-06 DIAGNOSIS — R278 Other lack of coordination: Secondary | ICD-10-CM

## 2018-05-06 DIAGNOSIS — R2689 Other abnormalities of gait and mobility: Secondary | ICD-10-CM

## 2018-05-06 DIAGNOSIS — I639 Cerebral infarction, unspecified: Secondary | ICD-10-CM

## 2018-05-06 DIAGNOSIS — R2681 Unsteadiness on feet: Secondary | ICD-10-CM

## 2018-05-06 NOTE — Therapy (Signed)
[place] MAIN [facility] [address] [place], [place], [address] Phone: [phone]   Fax:  [phone]  Physical Therapy Treatment  Patient Details  Name: Jason Oconnor MRN: [id number] Date of Birth: January 31, 1956 Referring Provider: Dr. Sharol Given   Encounter Date: 05/06/2018  PT End of Session - 05/06/18 1153    Visit Number  6    Number of Visits  25    Date for PT Re-Evaluation  06/22/18    PT Start Time  7614    PT Stop Time  1230    PT Time Calculation (min)  45 min    Equipment Utilized During Treatment  Gait belt    Activity Tolerance  Patient tolerated treatment well    Behavior During Therapy  [facility] for tasks assessed/performed       Past Medical History:  Diagnosis Date  . Bone marrow disease   . High cholesterol   . Hypertension   . Stroke [facility])     Past Surgical History:  Procedure Laterality Date  . ELBOW SURGERY Right    Tendinitis    Vitals:   05/06/18 1151  BP: 132/80    Subjective Assessment - 05/06/18 1152    Subjective  Patinet reports that he is having a heart monitor inserted tomorrow .     Patient is accompained by:  -- wife    Pertinent History  Patient states that he has chronic mild left-sided weakness and mild numbness from previous stroke. In the past 2 days, he has been feeling worsening weakness in left arm and leg. No vision change, hearing loss. No slurred speech, facial droop. He also has dizziness. Patient denies chest pain, shortness of breath, cough, fever or chills. No nausea, vomiting, diarrhea, abdominal pain, symptoms of UTI.He has a foot drop for which he uses a brace. : he has had at least two episodes of a stroke and the first was Aug 2017 and then April 5. The recent stroke was associted with head pressure and then some left sided weakness in the left arm and it is better. They called the Neurologist and he had an MRI Saturday and there was not change in the MRI from April 5. He had had a  shaking spell and weakness last week with a weird feeling in his chest for a few minutes and then it went away. He has been monitored and he has not had afib. Since he has four different stroke sites in the brain it is wondered if he may have atrial fib.     Limitations  Walking    How long can you stand comfortably?  5 mins    How long can you walk comfortably?  5 mins    Diagnostic tests  See history    Patient Stated Goals  "Get back as close as I can to normal." Pt would to be able to play golf. Pt would like to return to some kind of work.     Currently in Pain?  No/denies    Pain Score  0-No pain    Pain Onset  1 to 4 weeks ago        Treatment:   Patient performed outcome measures including 5 x sit to stand, TUG, 10 MW, 6 MW, Merrilee Jansky, DGI and reviewd all goals and reassessed his strength to address his goals. Educated patient on his goals and outcome measures.  PT Education - 05/06/18 1152    Education provided  Yes    Education Details  HEP    Person(s) Educated  Patient    Methods  Explanation    Comprehension  Verbalized understanding          PT Long Term Goals - 05/06/18 1155      PT LONG TERM GOAL #1   Title  Pt will be independent with HEP in order to improve strength and balance in order to decrease fall risk and improve function at home and work.    Baseline  10/30/16: Mod-min A for exericise progression/ performance 12/18/16: Requires minA for technique with exercise; 02/21/17: modA-maxA and cueing for muscle activation    Time  12    Period  Weeks    Status  Partially Met    Target Date  06/22/18      PT LONG TERM GOAL #2   Title  Pt will improve BERG by at least 3 points in order to demonstrate clinically significant improvement in balance.    Baseline  08/07/16: 49/56 10/02/16: 53/56 03/30/18 :52/56 05/06/18 = 54/56    Time  8    Period  Weeks    Status  Partially Met    Target Date  06/22/18      PT LONG TERM GOAL #3    Title  Pt will decrease TUG to below 14 seconds in order to demonstrate decreased fall risk     Baseline  08/07/16: 21.9 seconds; 10/02/16: 12.3 :05/06/18= 10.27    Time  8    Period  Weeks    Status  Achieved    Target Date  06/22/18      PT LONG TERM GOAL #4   Title  Pt will decrease 5TSTS by at least 3 seconds in order to demonstrate clinically significant improvement in LE strength    Baseline  08/07/16: 18.5 seconds; 10/02/16: 14.3  05/06/18= 11.76 sec    Time  8    Period  Weeks    Status  Achieved    Target Date  06/22/18      PT LONG TERM GOAL #5   Title  Pt will increase 10MWT by at least 0.13 m/s in order to demonstrate clinically significant improvement in community ambulation    Baseline  08/07/16: 0.53 m/s 10/02/16: .97 m/s 05/06/18= 1.01 m/sec    Time  12    Period  Weeks    Status  Partially Met    Target Date  06/22/18      PT LONG TERM GOAL #6   Title  Pt will be able to swing a golf club safely in order to complete 9 holes of golf    Baseline  10/30/16: Unable to swing golf club safely 12/18/16: unable to swing golf club; 02/21/17: unable to swing golf club, patient has not tried yet    Time  12    Period  Weeks    Status  On-going 06/22/18      PT LONG TERM GOAL #7   Title  Pt will improve 6MWT by at least 158f  to demonstrate improved aerobic capacity/endurance    Baseline  582 ft 10/02/16: 11070f6/12 19 1110     Time  12    Period  Weeks    Status  On-going 06/22/18      PT LONG TERM GOAL #8   Title  Patient will improve DGI by 4 points to demonstrate improvement in dynamic gait/balance and  decrease fall risk    Baseline  10/02/16: 19/24; 10/30/16: 21/24 12/18/16: 23/24 : 05/06/18 23/24    Time  6    Period  Weeks    Status  Achieved      PT LONG TERM GOAL  #9   TITLE  Patient will demonstrate ability to perform single leg stance for >10 sec bilaterally to better be able to climb ladders    Baseline  L LE SLS: 3 sec; L LE SLS: 4sec 05/06/18 LLE 3 sec     Time  8     Period  Weeks    Status  On-going 06/22/18      PT LONG TERM GOAL  #10   TITLE  Patient will be able to tandem walk 58f without loss of balance to better perform dynamic balance activities to perform occupational duties.    Baseline  Requires stepping responses to maintain balance with tandem walking; 02/21/17: Requires 4 step downs to maintain balance    Time  12    Period  Weeks    Status  On-going 06/22/18      PT LONG TERM GOAL  #11   TITLE  Pt will demonstrate 3/5 MMT strength in ankle eversion, toe ext and dorsiflexion to allow for the performance of ankle strategies to maintain balance    Baseline  1+/5: toe ext; dorsiflexion 2+/5; ankle eversion: 1+/5, 05/06/18 2+/5 DF and ankle eversion     Time  12    Period  Weeks    Status  Partially Met 06/22/18            Plan - 05/06/18 1153    Clinical Impression Statement   Pt was able to perform all outcome measures and review his goals. He is making progress towards goals.  Pt would continue to benefit from skilled PT services in order to further strengthen LE's, improve static and dynamic balance, and improve coordination in order to increase functional mobility and decrease risk of falls    Rehab Potential  Good    PT Frequency  2x / week    PT Duration  8 weeks    PT Treatment/Interventions  Aquatic Therapy;Manual techniques;Balance training;Neuromuscular re-education;Therapeutic activities;Therapeutic exercise;Gait training;Stair training;Orthotic Fit/Training;Dry needling    PT Next Visit Plan  balance training    PT Home Exercise Plan  heel raises, wall squats, seated L DF with RTB, L toe taps up to 6" step    Consulted and Agree with Plan of Care  Patient       Patient will benefit from skilled therapeutic intervention in order to improve the following deficits and impairments:  Abnormal gait, Decreased balance, Decreased endurance, Decreased mobility, Difficulty walking, Decreased activity tolerance, Decreased  coordination, Decreased strength  Visit Diagnosis: Muscle weakness (generalized)  Other lack of coordination  Unsteadiness on feet  Other abnormalities of gait and mobility  Right basal ganglia embolic stroke ([facility]     Problem List Patient Active Problem List   Diagnosis Date Noted  . HLD (hyperlipidemia) 02/27/2018  . Stroke ([place] 02/27/[place]  . GERD (gastroesophageal reflux disease) 02/27/2018  . Essential hypertension 02/27/2018  . 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 Neurocognitive disorder   . Left foot drop   . Benign essential HTN   . Hemiparesis affecting nondominant side as late effect of cerebrovascular [place] ([place]) ([place] 07/18/2016  . Right basal ganglia embolic stroke ([place] 001/12/7251 . CVA (cerebral infarction) 07/15/2016  . TIA (transient ischemic attack)  07/14/2016  . HTN, goal below 140/80 07/14/2016  . Left facial numbness 07/14/2016  . Weakness of left side of body 07/14/2016    Alanson Puls, [place] DPT 05/06/2018, 1:13 PM  [place] MAIN [facility] SERVICES [address] [place], [place], [address] Phone: [phone]   Fax:  [phone]  Name: Desiree Fleming MRN: [id number] Date of Birth: [birth year]

## 2018-05-11 ENCOUNTER — Encounter: Payer: Self-pay | Admitting: Occupational Therapy

## 2018-05-11 ENCOUNTER — Ambulatory Visit: Payer: 59 | Admitting: Physical Therapy

## 2018-05-11 ENCOUNTER — Ambulatory Visit: Payer: 59 | Admitting: Occupational Therapy

## 2018-05-11 ENCOUNTER — Encounter: Payer: Self-pay | Admitting: Physical Therapy

## 2018-05-11 DIAGNOSIS — I639 Cerebral infarction, unspecified: Secondary | ICD-10-CM

## 2018-05-11 DIAGNOSIS — M6281 Muscle weakness (generalized): Secondary | ICD-10-CM

## 2018-05-11 DIAGNOSIS — R2681 Unsteadiness on feet: Secondary | ICD-10-CM

## 2018-05-11 DIAGNOSIS — R278 Other lack of coordination: Secondary | ICD-10-CM

## 2018-05-11 DIAGNOSIS — R2689 Other abnormalities of gait and mobility: Secondary | ICD-10-CM

## 2018-05-11 NOTE — Therapy (Signed)
[place] MAIN [facility] SERVICES [address] [place], [place], [address] Phone: [phone]   Fax:  [phone]  Occupational Therapy Treatment  Patient Details  Name: Jason Oconnor MRN: [id number] Date of Birth: Aug 04, 1956 Referring Provider: Dr. Sharol Given   Encounter Date: 05/06/2018  OT End of Session - 05/11/18 2041    Visit Number  6    Number of Visits  24    Date for OT Re-Evaluation  06/23/18    Authorization Time Period  40 visit limit combined    OT Start Time  1100    OT Stop Time  1145    OT Time Calculation (min)  45 min    Activity Tolerance  Patient tolerated treatment well    Behavior During Therapy  [facility] for tasks assessed/performed       Past Medical History:  Diagnosis Date  . Bone marrow disease   . High cholesterol   . Hypertension   . Stroke [facility])     Past Surgical History:  Procedure Laterality Date  . ELBOW SURGERY Right    Tendinitis    There were no vitals filed for this visit.  Subjective Assessment - 05/11/18 2034    Subjective   Patient reports he will be going on Thursday for surgery to implant a cardiac monitor in chest, states, "I hope everything goes well, I won't be able to do a whole lot afterwards for a day or two."    Patient Stated Goals  To get back to doing as much as he can without fatigue    Currently in Pain?  No/denies    Pain Score  0-No pain                   OT Treatments/Exercises (OP) - 05/11/18 2036      Neurological Re-education Exercises   Other Exercises 1  1# dowel with long lever arm to complete supination/pronation exercise with cues for technique for 2 sets of 10 reps each.      Other Exercises 2  Patient seen for coordination skills with manipulation of golf balls, clockwise and countreclockwise in hand like chinese balls with cues and therapist demo.  Added ball over the top of the other ball with one hand, moving ball from palm to perch onto fingertips, and  moving one ball across base of MPs and back with use of thumb, written HEP issued relfecting these exercises.  Patient performed manipulation of washers to place onto long dowel sticks placed in elevated plane of motion, vertical and diagonal patterns to place and remove.  Manipulation of perler beads to place into grid and then removing with large tweezers, occasional cues required.              OT Education - 05/11/18 2040    Education provided  Yes    Education Details  manipulation of coins, washers holding between thumb and index, flat.  Golf ball exercises for home program    Person(s) Educated  Patient    Methods  Explanation;Demonstration;Verbal cues    Comprehension  Returned demonstration;Verbalized understanding          OT Long Term Goals - 05/11/18 2047      OT LONG TERM GOAL #1   Title  Pt. will improve LUE strength by 1 mm grade to assist with ADLs, and IADLs.     Baseline  Eval: Limited LUE strength    Time  12    Period  Weeks  Status  On-going      OT LONG TERM GOAL #2   Title  Pt. will improve left grip strength by 10# to be able to hold a full glass.    Baseline  Eval: Left grip strength: 40#    Time  12    Period  Weeks    Status  On-going      OT LONG TERM GOAL #3   Title  Pt. will improve left hand [place] skills by 5 sec. to be able to perform buttoning    Baseline  Eval: Left hand limited [place], limited buttoning    Time  12    Period  Weeks    Status  On-going      OT LONG TERM GOAL #4   Title  Pt. will improve 3pt. pinch strength to be able to clip nails.    Baseline  Eval: Pt. has difficulty    Time  12    Period  Weeks    Status  On-going      OT LONG TERM GOAL #5   Title  Pt. will increase activity tolerance to be able to tolerate 20 min. of ADL/IADL activity without rest breaks.     Baseline  Eval: Pt. tolerate 5-10 min. of activity.    Time  12    Period  Weeks    Status  On-going            Plan - 05/11/18 2042     Clinical Impression Statement  Patient has worn a 30 day heart monitor and now will have a more permanent cardiac monitor to be placed in chest on Thursday.  He will likely have restrictions afterwards and will be limited for a few days with activity.  Patient instructed on new exercises with use of golf balls for coordination skills and able to demonstrate but has difficulty with the quality of movement.  Also added picking up coins and washers flat with thumb and finger with some mild difficulty.  Continue to work towards goals to increase independence in daily tasks.     Occupational performance deficits (Please refer to evaluation for details):  ADL's;IADL's    Rehab Potential  Good    Current Impairments/barriers affecting progress:  Positive  Indicators: age, motivation, family support Negative Indicators: multiple comorbidities.    OT Frequency  1x / week    OT Duration  12 weeks    OT Treatment/Interventions  Self-care/ADL training;Therapeutic exercise;Therapeutic activities;Energy conservation;Neuromuscular education;Patient/family education    Consulted and Agree with Plan of Care  Patient       Patient will benefit from skilled therapeutic intervention in order to improve the following deficits and impairments:  Decreased coordination, Impaired UE functional use, Decreased endurance, Decreased activity tolerance, Decreased range of motion, Pain, Decreased strength  Visit Diagnosis: Muscle weakness (generalized)  Other lack of coordination    Problem List Patient Active Problem List   Diagnosis Date Noted  . HLD (hyperlipidemia) 02/27/2018  . Stroke ([place]) 02/27/2018  . GERD (gastroesophageal reflux disease) 02/27/2018  . Essential hypertension 02/27/2018  . 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 Neurocognitive disorder   . Left foot drop   . Benign essential HTN   . Hemiparesis affecting nondominant side as late effect of cerebrovascular  accident (CVA) ([place]) 07/18/2016  . Right basal ganglia embolic stroke ([place]) 00/93/8182  . CVA (cerebral infarction) 07/15/2016  . TIA (transient ischemic attack) 07/14/2016  . HTN, goal below 140/80 07/14/2016  .  Left facial numbness 07/14/2016  . Weakness of left side of body 07/14/2016   Shaeley Segall Oneita Jolly, OTR/L, CLT  Margery Szostak 05/11/2018, 8:47 PM  [place] MAIN [facility] SERVICES [address] [place], [place], [address] Phone: [phone]   Fax:  [phone]  Name: Tobias Avitabile MRN: [id number] Date of Birth: 1956/08/18

## 2018-05-11 NOTE — Therapy (Signed)
[place] MAIN [facility] SERVICES [address] [place], [place], [address] Phone: [phone]   Fax:  [phone]  Physical Therapy Treatment  Patient Details  Name: Jason Oconnor MRN: [id number] Date of Birth: September 17, 1956 Referring Provider: Dr. Sharol Given   Encounter Date: 05/11/2018  PT End of Session - 05/11/18 1024    Visit Number  7    Number of Visits  25    Date for PT Re-Evaluation  06/22/18    PT Start Time  1017    PT Stop Time  1100    PT Time Calculation (min)  43 min    Equipment Utilized During Treatment  Gait belt    Activity Tolerance  Patient tolerated treatment well    Behavior During Therapy  [facility] for tasks assessed/performed       Past Medical History:  Diagnosis Date  . Bone marrow disease   . High cholesterol   . Hypertension   . Stroke [facility])     Past Surgical History:  Procedure Laterality Date  . ELBOW SURGERY Right    Tendinitis    There were no vitals filed for this visit.  Subjective Assessment - 05/11/18 1022    Subjective  Patient is having 2/10 to left UE if he moves it above shoulder height.    Patient is accompained by:  -- wife    Pertinent History  Patient states that he has chronic mild left-sided weakness and mild numbness from previous stroke. In the past 2 days, he has been feeling worsening weakness in left arm and leg. No vision change, hearing loss. No slurred speech, facial droop. He also has dizziness. Patient denies chest pain, shortness of breath, cough, fever or chills. No nausea, vomiting, diarrhea, abdominal pain, symptoms of UTI.He has a foot drop for which he uses a brace. : he has had at least two episodes of a stroke and the first was Aug 2017 and then April 5. The recent stroke was associted with head pressure and then some left sided weakness in the left arm and it is better. They called the Neurologist and he had an MRI Saturday and there was not change in the MRI from April 5. He had  had a shaking spell and weakness last week with a weird feeling in his chest for a few minutes and then it went away. He has been monitored and he has not had afib. Since he has four different stroke sites in the brain it is wondered if he may have atrial fib.     Limitations  Walking    How long can you stand comfortably?  5 mins    How long can you walk comfortably?  5 mins    Diagnostic tests  See history    Patient Stated Goals  "Get back as close as I can to normal." Pt would to be able to play golf. Pt would like to return to some kind of work.     Currently in Pain?  Yes    Pain Score  2     Pain Location  Shoulder    Pain Orientation  Left    Pain Descriptors / Indicators  Aching    Pain Onset  1 to 4 weeks ago    Aggravating Factors   raising his arm above his shoulder    Effect of Pain on Daily Activities  nothing    Multiple Pain Sites  No       Treatment: Warm  up on oct[employer] fitness x 5 mins    Matrix side stepping / fwd stepping and backwards stepping with 22. 5 lbs x 3  Leg press 130 lbs x 20 , 110 lbs x 20 LLE , RLE knee pain with 100 lbs and stopped  TM elevation 4 with 1.1 miles / hour x 10 mins   Standing on airex : Feet apart on disk , head turns  X 20 rep each with CGA for safety ; Modified tandem stance, with 2 disk, head turns  x 20  reps with min A and cues to improve weight shift for better stance control; with min A for safety and min VCs to improve gaze stabilization for less loss of balance   CGA and Min to mod verbal cues used throughout with increased in postural sway and LOB most seen with narrow base of support and while on uneven surfaces. Continues to have balance deficits typical with diagnosis. Patient performs intermediate level exercises without pain behaviors and needs verbal cuing for postural alignment and head positioning T                    PT Education - 05/11/18 1023    Education provided  Yes    Education Details  HEP     Person(s) Educated  Patient    Methods  Explanation;Demonstration    Comprehension  Verbalized understanding;Returned demonstration          PT Long Term Goals - 05/06/18 1155      PT LONG TERM GOAL #1   Title  Pt will be independent with HEP in order to improve strength and balance in order to decrease fall risk and improve function at home and work.    Baseline  10/30/16: Mod-min A for exericise progression/ performance 12/18/16: Requires minA for technique with exercise; 02/21/17: modA-maxA and cueing for muscle activation    Time  12    Period  Weeks    Status  Partially Met    Target Date  06/22/18      PT LONG TERM GOAL #2   Title  Pt will improve BERG by at least 3 points in order to demonstrate clinically significant improvement in balance.    Baseline  08/07/16: 49/56 10/02/16: 53/56 03/30/18 :52/56 05/06/18 = 54/56    Time  8    Period  Weeks    Status  Partially Met    Target Date  06/22/18      PT LONG TERM GOAL #3   Title  Pt will decrease TUG to below 14 seconds in order to demonstrate decreased fall risk     Baseline  08/07/16: 21.9 seconds; 10/02/16: 12.3 :05/06/18= 10.27    Time  8    Period  Weeks    Status  Achieved    Target Date  06/22/18      PT LONG TERM GOAL #4   Title  Pt will decrease 5TSTS by at least 3 seconds in order to demonstrate clinically significant improvement in LE strength    Baseline  08/07/16: 18.5 seconds; 10/02/16: 14.3  05/06/18= 11.76 sec    Time  8    Period  Weeks    Status  Achieved    Target Date  06/22/18      PT LONG TERM GOAL #5   Title  Pt will increase 10MWT by at least 0.13 m/s in order to demonstrate clinically significant improvement in community ambulation    Baseline  08/07/16:  0.53 m/s 10/02/16: .97 m/s 05/06/18= 1.01 m/sec    Time  12    Period  Weeks    Status  Partially Met    Target Date  06/22/18      PT LONG TERM GOAL #6   Title  Pt will be able to swing a golf club safely in order to complete 9 holes of golf     Baseline  10/30/16: Unable to swing golf club safely 12/18/16: unable to swing golf club; 02/21/17: unable to swing golf club, patient has not tried yet    Time  12    Period  Weeks    Status  On-going 06/22/18      PT LONG TERM GOAL #7   Title  Pt will improve 6MWT by at least 163f  to demonstrate improved aerobic capacity/endurance    Baseline  582 ft 10/02/16: [id number]/12 19 1110     Time  12    Period  Weeks    Status  On-going 06/22/18      PT LONG TERM GOAL #8   Title  Patient will improve DGI by 4 points to demonstrate improvement in dynamic gait/balance and decrease fall risk    Baseline  10/02/16: 19/24; 10/30/16: 21/24 12/18/16: 23/24 : 05/06/18 23/24    Time  6    Period  Weeks    Status  Achieved      PT LONG TERM GOAL  #9   TITLE  Patient will demonstrate ability to perform single leg stance for >10 sec bilaterally to better be able to climb ladders    Baseline  L LE SLS: 3 sec; L LE SLS: 4sec 05/06/18 LLE 3 sec     Time  8    Period  Weeks    Status  On-going 06/22/18      PT LONG TERM GOAL  #10   TITLE  Patient will be able to tandem walk [id number] loss of balance to better perform dynamic balance activities to perform occupational duties.    Baseline  Requires stepping responses to maintain balance with tandem walking; 02/21/17: Requires 4 step downs to maintain balance    Time  12    Period  Weeks    Status  On-going 06/22/18      PT LONG TERM GOAL  #11   TITLE  Pt will demonstrate 3/5 MMT strength in ankle eversion, toe ext and dorsiflexion to allow for the performance of ankle strategies to maintain balance    Baseline  1+/5: toe ext; dorsiflexion 2+/5; ankle eversion: 1+/5, 05/06/18 2+/5 DF and ankle eversion     Time  12    Period  Weeks    Status  Partially Met 06/22/18            Plan - 05/11/18 1025    Clinical Impression Statement  Patient presents with decreased gait speed, decreased balance, and decreased BLE strength. Patient's main complaint is BLE  weakness and inability to participate in desired activities. Patient wants to improve his balance and ability to ambulate on inclines and outdoor surfaces safely .Paatient performed intermediate to advanced LE exercises in supine and seated positions, and closed chain  standing positions due to balance deficits.  Patient will benefit from skilled PT in order to increase gait speed, increase BLE strength, and improve dynamic standing balance to decrease risk for falls and enable patient to participate in desired activities.    Rehab Potential  Good    PT Frequency  2x / week    PT Duration  8 weeks    PT Treatment/Interventions  Aquatic Therapy;Manual techniques;Balance training;Neuromuscular re-education;Therapeutic activities;Therapeutic exercise;Gait training;Stair training;Orthotic Fit/Training;Dry needling    PT Next Visit Plan  balance training    PT Home Exercise Plan  heel raises, wall squats, seated L DF with RTB, L toe taps up to 6" step    Consulted and Agree with Plan of Care  Patient       Patient will benefit from skilled therapeutic intervention in order to improve the following deficits and impairments:  Abnormal gait, Decreased balance, Decreased endurance, Decreased mobility, Difficulty walking, Decreased activity tolerance, Decreased coordination, Decreased strength  Visit Diagnosis: Muscle weakness (generalized)  Other lack of coordination  Unsteadiness on feet  Other abnormalities of gait and mobility  Right basal ganglia embolic stroke [facility])     Problem List Patient Active Problem List   Diagnosis Date Noted  . HLD (hyperlipidemia) 02/27/2018  . Stroke ([place]) 02/27/2018  . GERD (gastroesophageal reflux disease) 02/27/2018  . Essential hypertension 02/27/2018  . 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 Neurocognitive disorder   . Left foot drop   . Benign essential HTN   . Hemiparesis affecting nondominant side as late effect of  cerebrovascular accident (CVA) ([place]) 07/18/2016  . Right basal ganglia embolic stroke ([place]) 54/36/0677  . CVA (cerebral infarction) 07/15/2016  . TIA (transient ischemic attack) 07/14/2016  . HTN, goal below 140/80 07/14/2016  . Left facial numbness 07/14/2016  . Weakness of left side of body 07/14/2016    Alanson Puls, [place] DPT 05/11/2018, 10:27 AM  [place] MAIN [facility] SERVICES [address] [place], [place], [address] Phone: [phone]   Fax:  [phone]  Name: Jason Oconnor MRN: [id number] Date of Birth: Mar 14, 1956

## 2018-05-12 ENCOUNTER — Encounter: Payer: Self-pay | Admitting: Occupational Therapy

## 2018-05-12 NOTE — Therapy (Signed)
[place] MAIN [facility] SERVICES [address] [place], [place], [address] Phone: [phone]   Fax:  [phone]  Occupational Therapy Treatment  Jason Oconnor Details  Name: Jason Oconnor MRN: [id number] Date of Birth: 1956-04-27 Referring Provider: Dr. Sharol Given   Encounter Date: 05/11/2018  OT End of Session - 05/12/18 1036    Visit Number  7    Number of Visits  24    Date for OT Re-Evaluation  06/23/18    Authorization Time Period  40 visit limit combined    OT Start Time  0930    OT Stop Time  1015    OT Time Calculation (min)  45 min    Activity Tolerance  Jason Oconnor tolerated treatment well    Behavior During Therapy  [facility] for tasks assessed/performed       Past Medical History:  Diagnosis Date  . Bone marrow disease   . High cholesterol   . Hypertension   . Stroke [facility])     Past Surgical History:  Procedure Laterality Date  . ELBOW SURGERY Right    Tendinitis    There were no vitals filed for this visit.  Subjective Assessment - 05/12/18 1033    Subjective   Jason Oconnor reports he had his heart monitor implant put in last week.  Things went well but he felt like his left shoulder tightened over the last few days, not doing as much activity as he did prior.  No lifting greater than 10#,  no raising left arm overhead for 4 weeks.     Pertinent History  Pt. is a 62 y.o. male wwith a history of a CVA. Pt. was admitted to [facility] on 02/27/2018 with a new onset of CVA, and left sided weakness. Pt. was admitted for one night. Imaging revealed: Acute 4 mm RIGHT inferior cerebellar infarct. New nonacute subcentimeter Left Inferior Cerebellar Infarct. Old Right Basal Ganglia Infarct. Remote Perforator Infarct affecting the right putemen, and corona..    Jason Oconnor Stated Goals  To get back to doing as much as he can without fatigue    Currently in Pain?  No/denies    Pain Score  0-No pain                   OT  Treatments/Exercises (OP) - 05/12/18 1035      Neurological Re-education Exercises   Other Exercises 1  Jason Oconnor seen for manipulation of 100 pegboard with left arm, focused on speed of completion and dexterity of task.  Dropping items occasionally especially in the last 2 rows when arm and shoulder feels more fatigue. Advanced to small dowel sticks with instruction to pick up and flip to opposite side to place into board, moderate difficulty with this date so modified to pick up between middle and thumb and turning from horizontal to vertical to place into board (1/2 turn).  Manipulation of nuts and bolts with cues for thumb/finger combinations with left hand leading task and right hand as a stabilizer.  Jason Oconnor seen for coordination with golf balls, 2 in hand and attempting to move in a clockwise and counterclockwise position, difficulty with moving in a clockwise position.  With one golf ball, he worked to move ball from index side of the hand to small finger side, rolling along MPs using thumb to manipulate golf ball.              OT Education - 05/12/18 1036    Education provided  Yes  Education Details  fine motor coordination skills, HEP    Person(s) Educated  Jason Oconnor    Methods  Explanation;Demonstration;Verbal cues    Comprehension  Returned demonstration;Verbalized understanding          OT Long Term Goals - 05/11/18 2047      OT LONG TERM GOAL #1   Title  Pt. will improve LUE strength by 1 mm grade to assist with ADLs, and IADLs.     Baseline  Eval: Limited LUE strength    Time  12    Period  Weeks    Status  On-going      OT LONG TERM GOAL #2   Title  Pt. will improve left grip strength by 10# to be able to hold a full glass.    Baseline  Eval: Left grip strength: 40#    Time  12    Period  Weeks    Status  On-going      OT LONG TERM GOAL #3   Title  Pt. will improve left hand [place] skills by 5 sec. to be able to perform buttoning    Baseline  Eval: Left hand  limited [place], limited buttoning    Time  12    Period  Weeks    Status  On-going      OT LONG TERM GOAL #4   Title  Pt. will improve 3pt. pinch strength to be able to clip nails.    Baseline  Eval: Pt. has difficulty    Time  12    Period  Weeks    Status  On-going      OT LONG TERM GOAL #5   Title  Pt. will increase activity tolerance to be able to tolerate 20 min. of ADL/IADL activity without rest breaks.     Baseline  Eval: Pt. tolerate 5-10 min. of activity.    Time  12    Period  Weeks    Status  On-going            Plan - 05/12/18 1036    Clinical Impression Statement  Jason Oconnor had a procedure last week to implant cardiac monitor and now has precautions for 4 weeks, he cannot lift greater than 10# and no shoulder flexion on the left overhead.  Jason Oconnor demonstrated difficulty with manipulation of small dowels with attempts to turn and flip with use of thumb, index and middle finger.  Cues for technique with thumb finger combinations with manipulation of nuts and bolts, slow deliberate movements with left hand.      Occupational performance deficits (Please refer to evaluation for details):  ADL's;IADL's    Rehab Potential  Good    Current Impairments/barriers affecting progress:  Positive  Indicators: age, motivation, family support Negative Indicators: multiple comorbidities.    OT Frequency  1x / week    OT Duration  12 weeks    OT Treatment/Interventions  Self-care/ADL training;Therapeutic exercise;Therapeutic activities;Energy conservation;Neuromuscular education;Jason Oconnor/family education    Consulted and Agree with Plan of Care  Jason Oconnor       Jason Oconnor will benefit from skilled therapeutic intervention in order to improve the following deficits and impairments:  Decreased coordination, Impaired UE functional use, Decreased endurance, Decreased activity tolerance, Decreased range of motion, Pain, Decreased strength  Visit Diagnosis: Muscle weakness (generalized)  Other  lack of coordination    Problem List Jason Oconnor Active Problem List   Diagnosis Date Noted  . HLD (hyperlipidemia) 02/27/2018  . Stroke ([place]) 02/27/2018  . GERD (gastroesophageal reflux  disease) 02/27/2018  . Essential hypertension 02/27/2018  . 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 Neurocognitive disorder   . Left foot drop   . Benign essential HTN   . Hemiparesis affecting nondominant side as late effect of cerebrovascular accident (CVA) ([place]) 07/18/2016  . Right basal ganglia embolic stroke ([place]) 94/32/7614  . CVA (cerebral infarction) 07/15/2016  . TIA (transient ischemic attack) 07/14/2016  . HTN, goal below 140/80 07/14/2016  . Left facial numbness 07/14/2016  . Weakness of left side of body 07/14/2016   Kristiann Noyce Oneita Jolly, OTR/L, CLT  [place] 05/12/2018, 10:37 AM  [place] MAIN [facility] SERVICES [address] [place], [place], [address] Phone: [phone]   Fax:  [phone]  Name: Orlander Norwood MRN: [id number] Date of Birth: 12/04/1955

## 2018-05-20 ENCOUNTER — Encounter: Payer: Self-pay | Admitting: Occupational Therapy

## 2018-05-20 ENCOUNTER — Encounter: Payer: Self-pay | Admitting: Physical Therapy

## 2018-05-20 ENCOUNTER — Ambulatory Visit: Payer: 59 | Admitting: Occupational Therapy

## 2018-05-20 ENCOUNTER — Ambulatory Visit: Payer: 59 | Admitting: Physical Therapy

## 2018-05-20 DIAGNOSIS — M6281 Muscle weakness (generalized): Secondary | ICD-10-CM | POA: Diagnosis not present

## 2018-05-20 DIAGNOSIS — R278 Other lack of coordination: Secondary | ICD-10-CM

## 2018-05-20 DIAGNOSIS — R2681 Unsteadiness on feet: Secondary | ICD-10-CM

## 2018-05-20 DIAGNOSIS — I639 Cerebral infarction, unspecified: Secondary | ICD-10-CM

## 2018-05-20 DIAGNOSIS — R2689 Other abnormalities of gait and mobility: Secondary | ICD-10-CM

## 2018-05-20 NOTE — Therapy (Signed)
[place] MAIN [facility] SERVICES [address] [place], [place], [address] Phone: [phone]   Fax:  [phone]  Physical Therapy Treatment  Patient Details  Name: Jason Oconnor MRN: [id number] Date of Birth: 08/17/1956 Referring Provider: Dr. Sharol Given   Encounter Date: 05/20/2018  PT End of Session - 05/20/18 0934    Visit Number  8    Number of Visits  25    Date for PT Re-Evaluation  06/22/18    PT Start Time  0920    PT Stop Time  1000    PT Time Calculation (min)  40 min    Equipment Utilized During Treatment  Gait belt    Activity Tolerance  Patient tolerated treatment well;Patient limited by pain    Behavior During Therapy  [facility] for tasks assessed/performed       Past Medical History:  Diagnosis Date  . Bone marrow disease   . High cholesterol   . Hypertension   . Stroke [facility])     Past Surgical History:  Procedure Laterality Date  . ELBOW SURGERY Right    Tendinitis    There were no vitals filed for this visit.  Subjective Assessment - 05/20/18 0933    Subjective  Patient is having 5/10 back pain due to getting busy at home.     Patient is accompained by:  -- wife    Pertinent History  Patient states that he has chronic mild left-sided weakness and mild numbness from previous stroke. In the past 2 days, he has been feeling worsening weakness in left arm and leg. No vision change, hearing loss. No slurred speech, facial droop. He also has dizziness. Patient denies chest pain, shortness of breath, cough, fever or chills. No nausea, vomiting, diarrhea, abdominal pain, symptoms of UTI.He has a foot drop for which he uses a brace. : he has had at least two episodes of a stroke and the first was Aug 2017 and then April 5. The recent stroke was associted with head pressure and then some left sided weakness in the left arm and it is better. They called the Neurologist and he had an MRI Saturday and there was not change in the MRI from  April 5. He had had a shaking spell and weakness last week with a weird feeling in his chest for a few minutes and then it went away. He has been monitored and he has not had afib. Since he has four different stroke sites in the brain it is wondered if he may have atrial fib.     Limitations  Walking    How long can you stand comfortably?  5 mins    How long can you walk comfortably?  5 mins    Diagnostic tests  See history    Patient Stated Goals  "Get back as close as I can to normal." Pt would to be able to play golf. Pt would like to return to some kind of work.     Currently in Pain?  Yes    Pain Score  5     Pain Location  Back    Pain Onset  1 to 4 weeks ago        Treatment:  Baps board level 2 and level 3 DF/PF x 20, CW, CCW x 20 with CGA  1/2 foam with feet apart apart and rod 2.5  Lbs press fwd x 20 flat side up and flat side down   1/2 foam with feet  apart apart and trunk rotation with theraball  x 20 flat side up and flat side down ,CGA needed for theraball balance with pt demonstrating good stability during twisting and ball toss.  1/2 foam flat side with tandem stand with theraball rotation  Tandem walk on 2 x 4 x 5 lengths of parallel bars and CGA ,. Verbal cues needed to keep alternating pattern with steps, and to not circumduct with LLE  Matrix fwd/bwd/side to side with 22. 5 lbs and CGA  CGA and Min to mod verbal cues used throughout with increased in postural sway and LOB most seen with narrow base of support and while on uneven surfaces. Continues to have balance deficits typical with diagnosis.                        PT Education - 05/20/18 0934    Education provided  Yes    Education Details  HEP    Person(s) Educated  Patient    Methods  Explanation    Comprehension  Verbalized understanding          PT Long Term Goals - 05/06/18 1155      PT LONG TERM GOAL #1   Title  Pt will be independent with HEP in order to improve  strength and balance in order to decrease fall risk and improve function at home and work.    Baseline  10/30/16: Mod-min A for exericise progression/ performance 12/18/16: Requires minA for technique with exercise; 02/21/17: modA-maxA and cueing for muscle activation    Time  12    Period  Weeks    Status  Partially Met    Target Date  06/22/18      PT LONG TERM GOAL #2   Title  Pt will improve BERG by at least 3 points in order to demonstrate clinically significant improvement in balance.    Baseline  08/07/16: 49/56 10/02/16: 53/56 03/30/18 :52/56 05/06/18 = 54/56    Time  8    Period  Weeks    Status  Partially Met    Target Date  06/22/18      PT LONG TERM GOAL #3   Title  Pt will decrease TUG to below 14 seconds in order to demonstrate decreased fall risk     Baseline  08/07/16: 21.9 seconds; 10/02/16: 12.3 :05/06/18= 10.27    Time  8    Period  Weeks    Status  Achieved    Target Date  06/22/18      PT LONG TERM GOAL #4   Title  Pt will decrease 5TSTS by at least 3 seconds in order to demonstrate clinically significant improvement in LE strength    Baseline  08/07/16: 18.5 seconds; 10/02/16: 14.3  05/06/18= 11.76 sec    Time  8    Period  Weeks    Status  Achieved    Target Date  06/22/18      PT LONG TERM GOAL #5   Title  Pt will increase 10MWT by at least 0.13 m/s in order to demonstrate clinically significant improvement in community ambulation    Baseline  08/07/16: 0.53 m/s 10/02/16: .97 m/s 05/06/18= 1.01 m/sec    Time  12    Period  Weeks    Status  Partially Met    Target Date  06/22/18      PT LONG TERM GOAL #6   Title  Pt will be able to swing a golf club  safely in order to complete 9 holes of golf    Baseline  10/30/16: Unable to swing golf club safely 12/18/16: unable to swing golf club; 02/21/17: unable to swing golf club, patient has not tried yet    Time  12    Period  Weeks    Status  On-going 06/22/18      PT LONG TERM GOAL #7   Title  Pt will improve 6MWT by at  least 147f  to demonstrate improved aerobic capacity/endurance    Baseline  582 ft 10/02/16: 11060f6/12 19 1110     Time  12    Period  Weeks    Status  On-going 06/22/18      PT LONG TERM GOAL #8   Title  Patient will improve DGI by 4 points to demonstrate improvement in dynamic gait/balance and decrease fall risk    Baseline  10/02/16: 19/24; 10/30/16: 21/24 12/18/16: 23/24 : 05/06/18 23/24    Time  6    Period  Weeks    Status  Achieved      PT LONG TERM GOAL  #9   TITLE  Patient will demonstrate ability to perform single leg stance for >10 sec bilaterally to better be able to climb ladders    Baseline  L LE SLS: 3 sec; L LE SLS: 4sec 05/06/18 LLE 3 sec     Time  8    Period  Weeks    Status  On-going 06/22/18      PT LONG TERM GOAL  #10   TITLE  Patient will be able to tandem walk 3060fithout loss of balance to better perform dynamic balance activities to perform occupational duties.    Baseline  Requires stepping responses to maintain balance with tandem walking; 02/21/17: Requires 4 step downs to maintain balance    Time  12    Period  Weeks    Status  On-going 06/22/18      PT LONG TERM GOAL  #11   TITLE  Pt will demonstrate 3/5 MMT strength in ankle eversion, toe ext and dorsiflexion to allow for the performance of ankle strategies to maintain balance    Baseline  1+/5: toe ext; dorsiflexion 2+/5; ankle eversion: 1+/5, 05/06/18 2+/5 DF and ankle eversion     Time  12    Period  Weeks    Status  Partially Met 06/22/18            Plan - 05/20/18 0935    Clinical Impression Statement  Patient required min verbal cueing during matrix machine stepping, and required CGA during all dynamic standing balance activities. Patient did not require any rest periods. Patient tolerated exercise well and needed cues for posture correction and CGA for higher level dynamic standing balance challenges. Patient will continue to benefit from skilled therapy in order to improve dynamic standing  balance activities and increase gait speed to reduce risk for falls    Rehab Potential  Good    PT Frequency  2x / week    PT Duration  8 weeks    PT Treatment/Interventions  Aquatic Therapy;Manual techniques;Balance training;Neuromuscular re-education;Therapeutic activities;Therapeutic exercise;Gait training;Stair training;Orthotic Fit/Training;Dry needling    PT Next Visit Plan  balance training    PT Home Exercise Plan  heel raises, wall squats, seated L DF with RTB, L toe taps up to 6" step    Consulted and Agree with Plan of Care  Patient       Patient will benefit from  skilled therapeutic intervention in order to improve the following deficits and impairments:  Abnormal gait, Decreased balance, Decreased endurance, Decreased mobility, Difficulty walking, Decreased activity tolerance, Decreased coordination, Decreased strength  Visit Diagnosis: Muscle weakness (generalized)  Other lack of coordination  Unsteadiness on feet  Other abnormalities of gait and mobility  Right basal ganglia embolic stroke [facility])     Problem List Patient Active Problem List   Diagnosis Date Noted  . HLD (hyperlipidemia) 02/27/2018  . Stroke ([place]) 02/27/2018  . GERD (gastroesophageal reflux disease) 02/27/2018  . Essential hypertension 02/27/2018  . 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 Neurocognitive disorder   . Left foot drop   . Benign essential HTN   . Hemiparesis affecting nondominant side as late effect of cerebrovascular accident (CVA) ([place]) 07/18/2016  . Right basal ganglia embolic stroke ([place]) 41/66/0630  . CVA (cerebral infarction) 07/15/2016  . TIA (transient ischemic attack) 07/14/2016  . HTN, goal below 140/80 07/14/2016  . Left facial numbness 07/14/2016  . Weakness of left side of body 07/14/2016    Alanson Puls, [place] DPT 05/20/2018, 9:40 AM  [place] MAIN [facility] SERVICES [address]  [place], [place], [address] Phone: [phone]   Fax:  [phone]  Name: Jason Oconnor MRN: [id number] Date of Birth: 31-Dec-1955

## 2018-05-20 NOTE — Therapy (Signed)
[place] MAIN [facility] SERVICES [address] [place], [place], [address] Phone: [phone]   Fax:  [phone]  Occupational Therapy Treatment  Patient Details  Name: Jason Oconnor MRN: [id number] Date of Birth: 17-Apr-1956 Referring Provider: Dr. Sharol Given   Encounter Date: 05/20/2018  OT End of Session - 05/20/18 0839    Visit Number  8    Number of Visits  24    Date for OT Re-Evaluation  06/23/18    Authorization Time Period  40 visit limit combined    OT Start Time  0830    OT Stop Time  0915    OT Time Calculation (min)  45 min    Activity Tolerance  Patient tolerated treatment well    Behavior During Therapy  [facility] for tasks assessed/performed       Past Medical History:  Diagnosis Date  . Bone marrow disease   . High cholesterol   . Hypertension   . Stroke [facility])     Past Surgical History:  Procedure Laterality Date  . ELBOW SURGERY Right    Tendinitis    There were no vitals filed for this visit.  Subjective Assessment - 05/20/18 0837    Subjective   Pt. reports that he is improving.    Pertinent History  Pt. is a 62 y.o. male wwith a history of a CVA. Pt. was admitted to [facility] on 02/27/2018 with a new onset of CVA, and left sided weakness. Pt. was admitted for one night. Imaging revealed: Acute 4 mm RIGHT inferior cerebellar infarct. New nonacute subcentimeter Left Inferior Cerebellar Infarct. Old Right Basal Ganglia Infarct. Remote Perforator Infarct affecting the right putemen, and corona..    Currently in Pain?  No/denies      OT TREATMENT    Neuro muscular re-education:  Pt. worked on grasping 1/2" flat washers with his left hand, storing the washers in the palm of his hand, and translatory movements of the hand moving the objects from his palm to the tip of his 2nd digit, and thumb. Pt. Worked on reaching up to place the washers onto vertical dowels. Pt. Worked on stacking the washers. Pt. Dropped multiple  washers when attempting to stack them. Pt. removed the washers from the dowels alternating thumb opposition to the tip of the 2nd through 5th digits. Pt. worked on grasping 1" resistive cubes alternating thumb opposition to the tip of the 2nd through 5th digits while the board is placed at a vertical angle. Pt. worked on pressing the cubes back into place while alternating isolated 2nd through 5th digit extension.  Therapeutic Exercise:  Pt. performed left gross gripping with grip strengthener. Pt. worked on sustaining grip while grasping pegs and reaching at various heights. Gripper was placed in the 4th resistive slot with the white resistive spring. Pt. worked on pinch strengthening in the left hand for lateral, and 3pt. pinch using yellow, red, green, and blue resistive clips. Pt. worked on placing the clips at various vertical and horizontal angles. Tactile and verbal cues were required for eliciting the desired movement.                          OT Education - 05/20/18 [phone]    Education provided  Yes    Education Details  fine motor coordination skills    Person(s) Educated  Patient    Methods  Explanation;Demonstration;Verbal cues    Comprehension  Returned demonstration;Verbalized understanding  OT Long Term Goals - 05/11/18 2047      OT LONG TERM GOAL #1   Title  Pt. will improve LUE strength by 1 mm grade to assist with ADLs, and IADLs.     Baseline  Eval: Limited LUE strength    Time  12    Period  Weeks    Status  On-going      OT LONG TERM GOAL #2   Title  Pt. will improve left grip strength by 10# to be able to hold a full glass.    Baseline  Eval: Left grip strength: 40#    Time  12    Period  Weeks    Status  On-going      OT LONG TERM GOAL #3   Title  Pt. will improve left hand [place] skills by 5 sec. to be able to perform buttoning    Baseline  Eval: Left hand limited Daniel, limited buttoning    Time  12    Period  Weeks    Status   On-going      OT LONG TERM GOAL #4   Title  Pt. will improve 3pt. pinch strength to be able to clip nails.    Baseline  Eval: Pt. has difficulty    Time  12    Period  Weeks    Status  On-going      OT LONG TERM GOAL #5   Title  Pt. will increase activity tolerance to be able to tolerate 20 min. of ADL/IADL activity without rest breaks.     Baseline  Eval: Pt. tolerate 5-10 min. of activity.    Time  12    Period  Weeks    Status  On-going            Plan - 05/20/18 0840    Clinical Impression Statement Pt. 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 Pt. continues to try to engage his left hand in his work as much as possible. Pt. continues to work on improving LUE strength, and coordination for improved engagement in ADL, and IADL functioning.     Occupational performance deficits (Please refer to evaluation for details):  ADL's;IADL's    Rehab Potential  Good    Current Impairments/barriers affecting progress:  Positive  Indicators: age, motivation, family support Negative Indicators: multiple comorbidities.    OT Frequency  1x / week    OT Duration  12 weeks    OT Treatment/Interventions  Self-care/ADL training;Therapeutic exercise;Therapeutic activities;Energy conservation;Neuromuscular education;Patient/family education    Clinical Decision Making  Several treatment options, min-mod task modification necessary    Consulted and Agree with Plan of Care  Patient       Patient will benefit from skilled therapeutic intervention in order to improve the following deficits and impairments:  Decreased coordination, Impaired UE functional use, Decreased endurance, Decreased activity tolerance, Decreased range of motion, Pain, Decreased strength  Visit Diagnosis: Muscle weakness (generalized)  Other lack of coordination    Problem List Patient Active Problem List   Diagnosis Date Noted  . HLD  (hyperlipidemia) 02/27/2018  . Stroke ([place]) 02/27/2018  . GERD (gastroesophageal reflux disease) 02/27/2018  . Essential hypertension 02/27/2018  . 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 Neurocognitive disorder   . Left foot drop   . Benign essential HTN   . Hemiparesis affecting nondominant side as late effect of cerebrovascular  accident (CVA) ([place]) 07/18/2016  . Right basal ganglia embolic stroke ([place]) 93/23/5573  . CVA (cerebral infarction) 07/15/2016  . TIA (transient ischemic attack) 07/14/2016  . HTN, goal below 140/80 07/14/2016  . Left facial numbness 07/14/2016  . Weakness of left side of body 07/14/2016    Harrel Carina, MS, OTR/L 05/20/2018, 8:49 AM  [place] MAIN [facility] SERVICES [address] [place], [place], [address] Phone: [phone]   Fax:  [phone]  Name: Jason Oconnor MRN: [id number] Date of Birth: November 24, 1956

## 2018-05-21 ENCOUNTER — Ambulatory Visit: Payer: 59 | Admitting: Physical Therapy

## 2018-05-26 ENCOUNTER — Ambulatory Visit: Payer: 59 | Admitting: Physical Therapy

## 2018-05-26 ENCOUNTER — Encounter: Payer: 59 | Admitting: Occupational Therapy

## 2018-06-02 ENCOUNTER — Encounter: Payer: Self-pay | Admitting: Occupational Therapy

## 2018-06-02 ENCOUNTER — Ambulatory Visit: Payer: 59 | Attending: Internal Medicine | Admitting: Physical Therapy

## 2018-06-02 ENCOUNTER — Encounter: Payer: Self-pay | Admitting: Physical Therapy

## 2018-06-02 ENCOUNTER — Ambulatory Visit: Payer: 59 | Admitting: Occupational Therapy

## 2018-06-02 DIAGNOSIS — I639 Cerebral infarction, unspecified: Secondary | ICD-10-CM | POA: Diagnosis present

## 2018-06-02 DIAGNOSIS — R278 Other lack of coordination: Secondary | ICD-10-CM

## 2018-06-02 DIAGNOSIS — R2689 Other abnormalities of gait and mobility: Secondary | ICD-10-CM

## 2018-06-02 DIAGNOSIS — R2681 Unsteadiness on feet: Secondary | ICD-10-CM | POA: Diagnosis present

## 2018-06-02 DIAGNOSIS — M6281 Muscle weakness (generalized): Secondary | ICD-10-CM

## 2018-06-02 NOTE — Therapy (Signed)
[place] MAIN [facility] SERVICES [address] [place], [place], [address] Phone: [phone]   Fax:  [phone]  Physical Therapy Treatment  Patient Details  Name: Jason Oconnor MRN: [id number] Date of Birth: Aug 04, 1956 Referring Provider: Dr. Sharol Given   Encounter Date: 06/02/2018  PT End of Session - 06/02/18 0909    Visit Number  9    Number of Visits  25    Date for PT Re-Evaluation  06/22/18    PT Start Time  0850    PT Stop Time  0930    PT Time Calculation (min)  40 min    Equipment Utilized During Treatment  Gait belt    Activity Tolerance  Patient tolerated treatment well;Patient limited by pain    Behavior During Therapy  [facility] for tasks assessed/performed       Past Medical History:  Diagnosis Date  . Bone marrow disease   . High cholesterol   . Hypertension   . Stroke [facility])     Past Surgical History:  Procedure Laterality Date  . ELBOW SURGERY Right    Tendinitis    There were no vitals filed for this visit.  Subjective Assessment - 06/02/18 0908    Subjective  Patient is doing well today, no reports of pain.     Patient is accompained by:  -- wife    Pertinent History  Patient states that he has chronic mild left-sided weakness and mild numbness from previous stroke. In the past 2 days, he has been feeling worsening weakness in left arm and leg. No vision change, hearing loss. No slurred speech, facial droop. He also has dizziness. Patient denies chest pain, shortness of breath, cough, fever or chills. No nausea, vomiting, diarrhea, abdominal pain, symptoms of UTI.He has a foot drop for which he uses a brace. : he has had at least two episodes of a stroke and the first was Aug 2017 and then April 5. The recent stroke was associted with head pressure and then some left sided weakness in the left arm and it is better. They called the Neurologist and he had an MRI Saturday and there was not change in the MRI from April 5. He had  had a shaking spell and weakness last week with a weird feeling in his chest for a few minutes and then it went away. He has been monitored and he has not had afib. Since he has four different stroke sites in the brain it is wondered if he may have atrial fib.     Limitations  Walking    How long can you stand comfortably?  5 mins    How long can you walk comfortably?  5 mins    Diagnostic tests  See history    Patient Stated Goals  "Get back as close as I can to normal." Pt would to be able to play golf. Pt would like to return to some kind of work.     Pain Score  0-No pain    Pain Onset  1 to 4 weeks ago    Multiple Pain Sites  No         Treatment:  BOSU ball lunges left and right x 10 with cues to slow down movement  Leg press x 20 x 2 90 lbs and heel raise 20 x 2   Squats on wall x 10 with 5 sec hold x 2 sets   1/2 foam with feet apart apart and trunk rotation  with theraball  x 20 flat side up and flat side down ,CGA needed for theraball balance with pt demonstrating good stability during twisting and ball toss.  1/2 foam flat side with tandem stand with theraball rotation  Tandem walk on 2 x 4 x 5 lengths of parallel bars and CGA ,. Verbal cues needed to keep alternating pattern with steps, and to not circumduct with LLE  Matrix fwd/bwd/side to side with 22. 5 lbs and CGA x 5 reps  CGA and Min to mod verbal cues used throughout with increased in postural sway and LOB most seen with narrow base of support and while on uneven surfaces. Continues to have balance deficits typical with diagnosis.                       PT Education - 06/02/18 0909    Education provided  Yes    Education Details  HEP    Person(s) Educated  Patient    Methods  Explanation    Comprehension  Verbalized understanding          PT Long Term Goals - 05/06/18 1155      PT LONG TERM GOAL #1   Title  Pt will be independent with HEP in order to improve strength and  balance in order to decrease fall risk and improve function at home and work.    Baseline  10/30/16: Mod-min A for exericise progression/ performance 12/18/16: Requires minA for technique with exercise; 02/21/17: modA-maxA and cueing for muscle activation    Time  12    Period  Weeks    Status  Partially Met    Target Date  06/22/18      PT LONG TERM GOAL #2   Title  Pt will improve BERG by at least 3 points in order to demonstrate clinically significant improvement in balance.    Baseline  08/07/16: 49/56 10/02/16: 53/56 03/30/18 :52/56 05/06/18 = 54/56    Time  8    Period  Weeks    Status  Partially Met    Target Date  06/22/18      PT LONG TERM GOAL #3   Title  Pt will decrease TUG to below 14 seconds in order to demonstrate decreased fall risk     Baseline  08/07/16: 21.9 seconds; 10/02/16: 12.3 :05/06/18= 10.27    Time  8    Period  Weeks    Status  Achieved    Target Date  06/22/18      PT LONG TERM GOAL #4   Title  Pt will decrease 5TSTS by at least 3 seconds in order to demonstrate clinically significant improvement in LE strength    Baseline  08/07/16: 18.5 seconds; 10/02/16: 14.3  05/06/18= 11.76 sec    Time  8    Period  Weeks    Status  Achieved    Target Date  06/22/18      PT LONG TERM GOAL #5   Title  Pt will increase 10MWT by at least 0.13 m/s in order to demonstrate clinically significant improvement in community ambulation    Baseline  08/07/16: 0.53 m/s 10/02/16: .97 m/s 05/06/18= 1.01 m/sec    Time  12    Period  Weeks    Status  Partially Met    Target Date  06/22/18      PT LONG TERM GOAL #6   Title  Pt will be able to swing a golf club safely in order  to complete 9 holes of golf    Baseline  10/30/16: Unable to swing golf club safely 12/18/16: unable to swing golf club; 02/21/17: unable to swing golf club, patient has not tried yet    Time  12    Period  Weeks    Status  On-going 06/22/18      PT LONG TERM GOAL #7   Title  Pt will improve 6MWT by at least 168f  to  demonstrate improved aerobic capacity/endurance    Baseline  582 ft 10/02/16: 11057f6/12 19 1110     Time  12    Period  Weeks    Status  On-going 06/22/18      PT LONG TERM GOAL #8   Title  Patient will improve DGI by 4 points to demonstrate improvement in dynamic gait/balance and decrease fall risk    Baseline  10/02/16: 19/24; 10/30/16: 21/24 12/18/16: 23/24 : 05/06/18 23/24    Time  6    Period  Weeks    Status  Achieved      PT LONG TERM GOAL  #9   TITLE  Patient will demonstrate ability to perform single leg stance for >10 sec bilaterally to better be able to climb ladders    Baseline  L LE SLS: 3 sec; L LE SLS: 4sec 05/06/18 LLE 3 sec     Time  8    Period  Weeks    Status  On-going 06/22/18      PT LONG TERM GOAL  #10   TITLE  Patient will be able to tandem walk 3063fithout loss of balance to better perform dynamic balance activities to perform occupational duties.    Baseline  Requires stepping responses to maintain balance with tandem walking; 02/21/17: Requires 4 step downs to maintain balance    Time  12    Period  Weeks    Status  On-going 06/22/18      PT LONG TERM GOAL  #11   TITLE  Pt will demonstrate 3/5 MMT strength in ankle eversion, toe ext and dorsiflexion to allow for the performance of ankle strategies to maintain balance    Baseline  1+/5: toe ext; dorsiflexion 2+/5; ankle eversion: 1+/5, 05/06/18 2+/5 DF and ankle eversion     Time  12    Period  Weeks    Status  Partially Met 06/22/18            Plan - 06/02/18 0911    Clinical Impression Statement  Patient   instructed in dynamic balance with matrix. . Patient is able to perform intermediate LE exercises in supine and standing open and closed chain, with no pain behaviors and resting throughout session. Patient performs dynamic standing balance training with uneven surfaces with CGA. Patient will continue to benefit from skilled PT to improve strength, balance and gait. Patient continues to have LE weakness    and decreased static and dynamic standing balance.    Rehab Potential  Good    PT Frequency  2x / week    PT Duration  8 weeks    PT Treatment/Interventions  Aquatic Therapy;Manual techniques;Balance training;Neuromuscular re-education;Therapeutic activities;Therapeutic exercise;Gait training;Stair training;Orthotic Fit/Training;Dry needling    PT Next Visit Plan  balance training    PT Home Exercise Plan  heel raises, wall squats, seated L DF with RTB, L toe taps up to 6" step    Consulted and Agree with Plan of Care  Patient       Patient will  benefit from skilled therapeutic intervention in order to improve the following deficits and impairments:  Abnormal gait, Decreased balance, Decreased endurance, Decreased mobility, Difficulty walking, Decreased activity tolerance, Decreased coordination, Decreased strength  Visit Diagnosis: Muscle weakness (generalized)  Other lack of coordination  Unsteadiness on feet  Other abnormalities of gait and mobility  Right basal ganglia embolic stroke [facility])     Problem List Patient Active Problem List   Diagnosis Date Noted  . HLD (hyperlipidemia) 02/27/2018  . Stroke ([place]) 02/27/2018  . GERD (gastroesophageal reflux disease) 02/27/2018  . Essential hypertension 02/27/2018  . 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 Neurocognitive disorder   . Left foot drop   . Benign essential HTN   . Hemiparesis affecting nondominant side as late effect of cerebrovascular accident (CVA) ([place]) 07/18/2016  . Right basal ganglia embolic stroke ([place]) 03/00/9233  . CVA (cerebral infarction) 07/15/2016  . TIA (transient ischemic attack) 07/14/2016  . HTN, goal below 140/80 07/14/2016  . Left facial numbness 07/14/2016  . Weakness of left side of body 07/14/2016    Alanson Puls, [place] DPT 06/02/2018, 9:26 AM  [place] MAIN [facility] SERVICES [address] [place], [place],  [address] Phone: [phone]   Fax:  [phone]  Name: Jason Oconnor MRN: [id number] Date of Birth: 02-18-1956

## 2018-06-02 NOTE — Therapy (Signed)
[place] MAIN [facility] SERVICES [address] [place], [place], [address] Phone: [phone]   Fax:  [phone]  Occupational Therapy Treatment  Patient Details  Name: Jason Oconnor MRN: [id number] Date of Birth: December 09, 1955 Referring Provider: Dr. Sharol Given   Encounter Date: 06/02/2018  OT End of Session - 06/02/18 0951    Visit Number  9    Number of Visits  24    Date for OT Re-Evaluation  06/23/18    Authorization Time Period  40 visit limit combined    OT Start Time  0930    OT Stop Time  1015    OT Time Calculation (min)  45 min    Activity Tolerance  Patient tolerated treatment well    Behavior During Therapy  [facility] for tasks assessed/performed       Past Medical History:  Diagnosis Date  . Bone marrow disease   . High cholesterol   . Hypertension   . Stroke [facility])     Past Surgical History:  Procedure Laterality Date  . ELBOW SURGERY Right    Tendinitis    There were no vitals filed for this visit.  Subjective Assessment - 06/02/18 0950    Subjective   Pt. reports feeling weaker since he has been away.    Pertinent History  Pt. is a 62 y.o. male wwith a history of a CVA. Pt. was admitted to [facility] on 02/27/2018 with a new onset of CVA, and left sided weakness. Pt. was admitted for one night. Imaging revealed: Acute 4 mm RIGHT inferior cerebellar infarct. New nonacute subcentimeter Left Inferior Cerebellar Infarct. Old Right Basal Ganglia Infarct. Remote Perforator Infarct affecting the right putemen, and corona..    Patient Stated Goals  To get back to doing as much as he can without fatigue    Currently in Pain?  No/denies       OT TREATMENT    Neuro muscular re-education:  Pt. performed [facility] skills training to improve speed and dexterity needed for ADL tasks and writing. Pt. demonstrated grasping 1 inch sticks,  inch cylindrical collars, and  inch flat washers on the [place] pegboard. Pt. performed grasping each  item with his 2nd digit and thumb. Pt. Attempted to work on storing the sticks, however hand difficulty. Pt. Worked on grasping coins from a tabletop surface, placing them into a resistive container, and pushing them through the slot while isolating his 2nd digit. Pt. worked on translatory movements of the hand successfully with the larger coins, quarters. Smaller coins were more difficult to move, and often slid between the fingers.  Therapeutic Exercise:  Pt. performed gross gripping with grip strengthener. Pt. worked on sustaining grip while grasping pegs and reaching at various heights. Gripper was placed in the 4th resistive slot with the white resistive spring. Pt. Worked on 2 reps with the left hand. Pt. Worked on pinch strengthening in the left hand for lateral, and 3pt. pinch using red, green, and blue resistive clips. Pt. worked on placing the clips at various vertical and horizontal angles. Tactile and verbal cues were required for eliciting the desired movement.                        OT Education - 06/02/18 [phone]    Education Details  fine motor coordination skills    Person(s) Educated  Patient    Methods  Explanation;Demonstration;Verbal cues    Comprehension  Returned demonstration;Verbalized understanding  OT Long Term Goals - 05/11/18 2047      OT LONG TERM GOAL #1   Title  Pt. will improve LUE strength by 1 mm grade to assist with ADLs, and IADLs.     Baseline  Eval: Limited LUE strength    Time  12    Period  Weeks    Status  On-going      OT LONG TERM GOAL #2   Title  Pt. will improve left grip strength by 10# to be able to hold a full glass.    Baseline  Eval: Left grip strength: 40#    Time  12    Period  Weeks    Status  On-going      OT LONG TERM GOAL #3   Title  Pt. will improve left hand [place] skills by 5 sec. to be able to perform buttoning    Baseline  Eval: Left hand limited [place], limited buttoning    Time  12    Period   Weeks    Status  On-going      OT LONG TERM GOAL #4   Title  Pt. will improve 3pt. pinch strength to be able to clip nails.    Baseline  Eval: Pt. has difficulty    Time  12    Period  Weeks    Status  On-going      OT LONG TERM GOAL #5   Title  Pt. will increase activity tolerance to be able to tolerate 20 min. of ADL/IADL activity without rest breaks.     Baseline  Eval: Pt. tolerate 5-10 min. of activity.    Time  12    Period  Weeks    Status  On-going            Plan - 06/02/18 0951    Clinical Impression Statement Pt. reports that he feels weaker from being away in [place] for a week. Pt. reports that drove most of the way. Pt. 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 Pt. reports the he was able to get out to the driving range some, and that he was able to keep his left hand on the driver while swinging to hit the ball 175 yards. Pt. was unable to keep his left hand on the iron during the swing. Pt. has difficulty with hand function skills, and translatory movements of the hand. Pt. continues to work on improving left UE strength, hand function, and [facility] skills in order to improve functional use during ADLs, and IADLs.    Occupational performance deficits (Please refer to evaluation for details):  ADL's;IADL's    Rehab Potential  Good    Current Impairments/barriers affecting progress:  Positive  Indicators: age, motivation, family support Negative Indicators: multiple comorbidities.    OT Frequency  1x / week    OT Duration  12 weeks    OT Treatment/Interventions  Self-care/ADL training;Therapeutic exercise;Therapeutic activities;Energy conservation;Neuromuscular education;Patient/family education    Clinical Decision Making  Several treatment options, min-mod task modification necessary    Consulted and Agree with Plan of Care  Patient       Patient will benefit from skilled therapeutic intervention in order to improve the following  deficits and impairments:  Decreased coordination, Impaired UE functional use, Decreased endurance, Decreased activity tolerance, Decreased range of motion, Pain, Decreased strength  Visit Diagnosis: Muscle weakness (generalized)  Other lack of coordination    Problem  List Patient Active Problem List   Diagnosis Date Noted  . HLD (hyperlipidemia) 02/27/2018  . Stroke ([place]) 02/27/2018  . GERD (gastroesophageal reflux disease) 02/27/2018  . Essential hypertension 02/27/2018  . 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 Neurocognitive disorder   . Left foot drop   . Benign essential HTN   . Hemiparesis affecting nondominant side as late effect of cerebrovascular accident (CVA) ([place]) 07/18/2016  . Right basal ganglia embolic stroke ([place]) 75/30/0511  . CVA (cerebral infarction) 07/15/2016  . TIA (transient ischemic attack) 07/14/2016  . HTN, goal below 140/80 07/14/2016  . Left facial numbness 07/14/2016  . Weakness of left side of body 07/14/2016    Harrel Carina, MS, OTR/L 06/02/2018, 10:06 AM  [place] MAIN [facility] SERVICES [address] [place], [place], [address] Phone: [phone]   Fax:  [phone]  Name: Jason Oconnor MRN: [id number] Date of Birth: 01/17/1956

## 2018-06-09 ENCOUNTER — Encounter: Payer: 59 | Admitting: Occupational Therapy

## 2018-06-09 ENCOUNTER — Ambulatory Visit: Payer: 59 | Admitting: Physical Therapy

## 2018-06-11 ENCOUNTER — Ambulatory Visit: Payer: 59 | Admitting: Physical Therapy

## 2018-06-11 ENCOUNTER — Encounter: Payer: 59 | Admitting: Occupational Therapy

## 2018-06-17 ENCOUNTER — Encounter: Payer: Self-pay | Admitting: Physical Therapy

## 2018-06-17 ENCOUNTER — Ambulatory Visit: Payer: 59 | Admitting: Occupational Therapy

## 2018-06-17 ENCOUNTER — Encounter: Payer: Self-pay | Admitting: Occupational Therapy

## 2018-06-17 ENCOUNTER — Ambulatory Visit: Payer: 59 | Admitting: Physical Therapy

## 2018-06-17 DIAGNOSIS — I639 Cerebral infarction, unspecified: Secondary | ICD-10-CM

## 2018-06-17 DIAGNOSIS — M6281 Muscle weakness (generalized): Secondary | ICD-10-CM

## 2018-06-17 DIAGNOSIS — R2689 Other abnormalities of gait and mobility: Secondary | ICD-10-CM

## 2018-06-17 DIAGNOSIS — R2681 Unsteadiness on feet: Secondary | ICD-10-CM

## 2018-06-17 DIAGNOSIS — R278 Other lack of coordination: Secondary | ICD-10-CM

## 2018-06-17 NOTE — Therapy (Signed)
[place] MAIN [facility] SERVICES [address] [place], [place], [address] Phone: [phone]   Fax:  [phone]  Physical Therapy Treatment/ Discharge summary  Patient Details  Name: Jason Oconnor MRN: [id number] Date of Birth: [birth year] Referring Provider: Dr. Sharol Given   Encounter Date: 06/17/2018  PT End of Session - 06/17/18 0859    Visit Number  10    Number of Visits  25    Date for PT Re-Evaluation  06/22/18    PT Start Time  0850    PT Stop Time  0930    PT Time Calculation (min)  40 min    Equipment Utilized During Treatment  Gait belt    Activity Tolerance  Patient tolerated treatment well;Patient limited by pain    Behavior During Therapy  [facility] for tasks assessed/performed       Past Medical History:  Diagnosis Date  . Bone marrow disease   . High cholesterol   . Hypertension   . Stroke [facility])     Past Surgical History:  Procedure Laterality Date  . ELBOW SURGERY Right    Tendinitis    There were no vitals filed for this visit.  Subjective Assessment - 06/17/18 0858    Subjective  Patient is doing well today, reports of pain in B hips.     Patient is accompained by:  -- wife    Pertinent History  Patient states that he has chronic mild left-sided weakness and mild numbness from previous stroke. In the past 2 days, he has been feeling worsening weakness in left arm and leg. No vision change, hearing loss. No slurred speech, facial droop. He also has dizziness. Patient denies chest pain, shortness of breath, cough, fever or chills. No nausea, vomiting, diarrhea, abdominal pain, symptoms of UTI.He has a foot drop for which he uses a brace. : he has had at least two episodes of a stroke and the first was Aug 2017 and then April 5. The recent stroke was associted with head pressure and then some left sided weakness in the left arm and it is better. They called the Neurologist and he had an MRI Saturday and there was not change in  the MRI from April 5. He had had a shaking spell and weakness last week with a weird feeling in his chest for a few minutes and then it went away. He has been monitored and he has not had afib. Since he has four different stroke sites in the brain it is wondered if he may have atrial fib.     Limitations  Walking    How long can you stand comfortably?  5 mins    How long can you walk comfortably?  5 mins    Diagnostic tests  See history    Patient Stated Goals  "Get back as close as I can to normal." Pt would to be able to play golf. Pt would like to return to some kind of work.     Currently in Pain?  Yes    Pain Score  1     Pain Location  Hip    Pain Orientation  Right;Left    Pain Descriptors / Indicators  Aching    Pain Onset  1 to 4 weeks ago    Multiple Pain Sites  No       Therapeutic activities: DGI, BERG, single leg stand, tandem walking, 6 MW, 10 MW , strength assessed and goals reviewed  Treatment:  BOSU ball lunges left and right x 10 with cues to slow down movement  1/2 foam with feet apart apart and trunk rotation with theraball x 20 flat side up and flat side down ,CGA needed for theraball balance with pt demonstrating good stability during twisting and ball toss.  1/2 foam flat side with tandem stand with theraball rotation  Tandem walk on 2 x 4 x 5 lengths of parallel bars and CGA ,. Verbal cues needed to keep alternating pattern with steps,and to not circumduct with LLE    CGA and Min to mod verbal cues used throughout with increased in postural sway and LOB most seen with narrow base of support and while on uneven surfaces. Continues to have balance deficits typical with diagnosis                     PT Education - 06/17/18 0859    Education provided  Yes    Education Details  HEP    Person(s) Educated  Patient    Methods  Explanation;Verbal cues    Comprehension  Verbalized understanding;Returned demonstration;Verbal cues  required          PT Long Term Goals - 06/17/18 0900      PT LONG TERM GOAL #1   Title  Pt will be independent with HEP in order to improve strength and balance in order to decrease fall risk and improve function at home and work.    Baseline  10/30/16: Mod-min A for exericise progression/ performance 12/18/16: Requires minA for technique with exercise; 02/21/17: modA-maxA and cueing for muscle activation    Time  12    Period  Weeks    Status  Achieved      PT LONG TERM GOAL #2   Title  Pt will improve BERG by at least 3 points in order to demonstrate clinically significant improvement in balance.    Baseline  08/07/16: 49/56 10/02/16: 53/56 03/30/18 :52/56 05/06/18 = 54/56    Time  8    Period  Weeks    Status  Achieved      PT LONG TERM GOAL #3   Title  Pt will decrease TUG to below 14 seconds in order to demonstrate decreased fall risk     Baseline  08/07/16: 21.9 seconds; 10/02/16: 12.3 :05/06/18= 10.27    Time  8    Period  Weeks    Status  Achieved      PT LONG TERM GOAL #4   Title  Pt will decrease 5TSTS by at least 3 seconds in order to demonstrate clinically significant improvement in LE strength    Baseline  08/07/16: 18.5 seconds; 10/02/16: 14.3  05/06/18= 11.76 sec    Time  8    Period  Weeks    Status  Achieved      PT LONG TERM GOAL #5   Title  Pt will increase 10MWT by at least 0.13 m/s in order to demonstrate clinically significant improvement in community ambulation    Baseline  08/07/16: 0.53 m/s 10/02/16: .97 m/s 05/06/18= 1.01 m/sec, 06/17/18 1.05     Time  12    Period  Weeks    Status  Achieved      PT LONG TERM GOAL #6   Title  Pt will be able to swing a golf club safely in order to complete 9 holes of golf    Baseline  10/30/16: Unable to swing golf club safely 12/18/16: unable to  swing golf club; 02/21/17: unable to swing golf club, patient has not tried yet    Time  12    Period  Weeks    Status  On-going 06/22/18      PT LONG TERM GOAL #7   Title  Pt will  improve 6MWT by at least 169f  to demonstrate improved aerobic capacity/endurance    Baseline  582 ft 10/02/16: 11052f6/12 19 1110 ;06/17/18 = 1220 ft    Time  12    Period  Weeks    Status  On-going 06/22/18      PT LONG TERM GOAL #8   Title  Patient will improve DGI by 4 points to demonstrate improvement in dynamic gait/balance and decrease fall risk    Baseline  10/02/16: 19/24; 10/30/16: 21/24 12/18/16: 23/24 : 05/06/18 23/24    Time  6    Period  Weeks    Status  Achieved      PT LONG TERM GOAL  #9   TITLE  Patient will demonstrate ability to perform single leg stance for >10 sec bilaterally to better be able to climb ladders    Baseline  L LE SLS: 3 sec; L LE SLS: 4sec 05/06/18 LLE 3 sec 06/17/18 Single leg stand left leg = 5 sec     Time  8    Period  Weeks    Status  On-going 06/22/18      PT LONG TERM GOAL  #10   TITLE  Patient will be able to tandem walk 3045fithout loss of balance to better perform dynamic balance activities to perform occupational duties.    Baseline  Requires stepping responses to maintain balance with tandem walking; 02/21/17: Requires 4 step downs to maintain balance    Time  12    Period  Weeks    Status  Achieved 06/22/18      PT LONG TERM GOAL  #11   TITLE  Pt will demonstrate 3/5 MMT strength in ankle eversion, toe ext and dorsiflexion to allow for the performance of ankle strategies to maintain balance    Baseline  1+/5: toe ext; dorsiflexion 2+/5; ankle eversion: 1+/5, 05/06/18 2+/5 DF and ankle eversion     Time  12    Period  Weeks    Status  Partially Met 06/22/18            Plan - 06/17/18 [id number] Clinical Impression Statement  Patient has met 7 out of 12 goals and is able to perform his HEP independently and has plans to [facility] to continue to exercise. Patient has improved with his dGI and BERG and dynamic standing balance is improving. He continues to have left ankle weakness and LLE weakness with gait abnormality. Patient is  in agreement with plan of care to DC.     Rehab Potential  Good    PT Frequency  2x / week    PT Duration  8 weeks    PT Treatment/Interventions  Aquatic Therapy;Manual techniques;Balance training;Neuromuscular re-education;Therapeutic activities;Therapeutic exercise;Gait training;Stair training;Orthotic Fit/Training;Dry needling    PT Next Visit Plan  balance training    PT Home Exercise Plan  heel raises, wall squats, seated L DF with RTB, L toe taps up to 6" step    Consulted and Agree with Plan of Care  Patient       Patient will benefit from skilled therapeutic intervention in order to improve the following deficits and impairments:  Abnormal gait,  Decreased balance, Decreased endurance, Decreased mobility, Difficulty walking, Decreased activity tolerance, Decreased coordination, Decreased strength  Visit Diagnosis: Muscle weakness (generalized)  Other lack of coordination  Unsteadiness on feet  Other abnormalities of gait and mobility  Right basal ganglia embolic stroke [facility])     Problem List Patient Active Problem List   Diagnosis Date Noted  . HLD (hyperlipidemia) 02/27/2018  . Stroke ([place]) 02/27/2018  . GERD (gastroesophageal reflux disease) 02/27/2018  . Essential hypertension 02/27/2018  . 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 Neurocognitive disorder   . Left foot drop   . Benign essential HTN   . Hemiparesis affecting nondominant side as late effect of cerebrovascular accident (CVA) ([place]) 07/18/2016  . Right basal ganglia embolic stroke ([place]) 73/56/7014  . CVA (cerebral infarction) 07/15/2016  . TIA (transient ischemic attack) 07/14/2016  . HTN, goal below 140/80 07/14/2016  . Left facial numbness 07/14/2016  . Weakness of left side of body 07/14/2016    Alanson Puls, [place] DPT 06/17/2018, 9:24 AM  [place] MAIN [facility] SERVICES [address] [place], [place], [address] Phone:  [phone]   Fax:  [phone]  Name: Garland Hincapie MRN: [id number] Date of Birth: 1956-10-16

## 2018-06-24 ENCOUNTER — Encounter: Payer: Self-pay | Admitting: Occupational Therapy

## 2018-06-24 ENCOUNTER — Ambulatory Visit: Payer: 59 | Admitting: Physical Therapy

## 2018-06-24 ENCOUNTER — Ambulatory Visit: Payer: 59 | Admitting: Occupational Therapy

## 2018-06-24 DIAGNOSIS — M6281 Muscle weakness (generalized): Secondary | ICD-10-CM

## 2018-06-24 DIAGNOSIS — R278 Other lack of coordination: Secondary | ICD-10-CM

## 2018-06-24 NOTE — Therapy (Signed)
[place] MAIN [facility] SERVICES [address] [place], [place], [address] Phone: [phone]   Fax:  [phone]  Occupational Therapy Treatment  Patient Details  Name: Jason Oconnor MRN: [id number] Date of Birth: March 23, 1956 Referring Provider: Dr. Sharol Given   Encounter Date: 06/24/2018  OT End of Session - 06/24/18 1143    Visit Number  11    Number of Visits  24    Date for OT Re-Evaluation  08/12/18       Past Medical History:  Diagnosis Date  . Bone marrow disease   . High cholesterol   . Hypertension   . Stroke [facility])     Past Surgical History:  Procedure Laterality Date  . ELBOW SURGERY Right    Tendinitis    There were no vitals filed for this visit.  Subjective Assessment - 06/24/18 1142    Subjective   Denies any pain today.  "Today is just getting started" Reports he has not had a chance to look for a 2# weight to use at home.  His grandson has been visiting most days for the last week. He played "catch" with his grandson throwing a ball sock to him in multi direction.     Pertinent History  Pt. is a 62 y.o. male wwith a history of a CVA. Pt. was admitted to [facility] on 02/27/2018 with a new onset of CVA, and left sided weakness. Pt. was admitted for one night. Imaging revealed: Acute 4 mm RIGHT inferior cerebellar infarct. New nonacute subcentimeter Left Inferior Cerebellar Infarct. Old Right Basal Ganglia Infarct. Remote Perforator Infarct affecting the right putemen, and corona..    Patient Stated Goals  To get back to doing as much as he can without fatigue    Currently in Pain?  No/denies    Pain Score  0-No pain          Patient seen for Left UE ROM and strengthening with 2# weight to left wrist, reaching and moving items from shape tower from one level to the next, one at a time, all 4 levels and then in reverse all 4 levels. Short rest breaks as needed when arm fatigues.   Bulletin board with small push pins  while holding foam in ulnar side of the hand, progressing from larger piece (red)  to small piece (blue). Removing push pins with small marble placed in the ulnar side of the hand on the left, smaller and more unstable item to hold, using index and thumb for removal of pins.  3 point pinch while holding small marble to place and remove resistive Velcro squares, did not drop item out of ulnar side of the hand during activity.  Manipulation of small pop beads with moderate resistance, cues for prehension patterns. Dropped 2 on floor. Manipulation of [place] discs to pick up with left hand and flip to opposite side.  Then worked towards turning one way and then the opposite direction with cues.   Instructions on how to incorporate some of these same principles of targeting the ulnar side of the hand into home exercises.                   OT Education - 06/24/18 1142    Education provided  Yes    Education Details  goals, POC, exercises for ulnar side of hand while using radial side    Person(s) Educated  Patient    Methods  Explanation;Demonstration;Verbal cues    Comprehension  Returned demonstration;Verbalized understanding          OT Long Term Goals - 06/24/18 1144      OT LONG TERM GOAL #1   Title  Pt. will improve LUE strength by 1 mm grade to assist with ADLs, and IADLs.     Baseline  Eval: Limited LUE strength, update 7/31 improved to 4/5 for shoulder    Time  12    Period  Weeks    Status  On-going      OT LONG TERM GOAL #2   Title  Pt. will improve left grip strength by 10# to be able to hold a full glass.    Baseline  Eval: Left grip strength: 40#, update 7/31 45#    Time  12    Period  Weeks    Status  On-going      OT LONG TERM GOAL #3   Title  Pt. will improve left hand [place] skills by 5 sec. to be able to perform buttoning    Baseline  Eval: Left hand limited [place], limited buttoning, still has difficulty with small buttons    Time  12    Period   Weeks    Status  On-going      OT LONG TERM GOAL #4   Title  Pt. will improve 3pt. pinch strength to be able to clip nails.    Baseline  Eval: Pt. has difficulty, update:  getting easier but still has difficulty performing    Time  12    Period  Weeks    Status  On-going      OT LONG TERM GOAL #5   Title  Pt. will increase activity tolerance to be able to tolerate 20 min. of ADL/IADL activity without rest breaks.     Baseline  Eval: Pt. tolerate 5-10 min. of activity.    Time  12    Period  Weeks    Status  Achieved            Plan - 06/24/18 1143    Clinical Impression Statement  Patient has been seen 1x a week in the OT clinic over the past few weeks and has made good progress in all areas.  He has been seen for strengthening exercises for left UE, ROM, coordination, hand function and incorporating these skills in his daily routines and roles.  Reassessment this date, goals updated to reflect progress.  He has progressed with repeated overhead motion with shoulder flexion and is now utilizing 2# weight and no complaints of pain.  We have added additional exercises for home and he has been consistent with performing tasks on a daily basis.  Recent focus has been on left hand coordination between the radial and ulnar side of the hand as well as strength in the hand for improved power gripping, full gripping tasks.  He continues to benefit from skilled OT to focus on these areas to continue to improve independence in necessary daily tasks.      Occupational performance deficits (Please refer to evaluation for details):  ADL's;IADL's    Rehab Potential  Good    Current Impairments/barriers affecting progress:  Positive  Indicators: age, motivation, family support Negative Indicators: multiple comorbidities.    OT Frequency  1x / week    OT Duration  8 weeks    OT Treatment/Interventions  Self-care/ADL training;Therapeutic exercise;Therapeutic activities;Energy conservation;Neuromuscular  education;Patient/family education    Consulted and Agree with Plan of Care  Patient  Patient will benefit from skilled therapeutic intervention in order to improve the following deficits and impairments:  Decreased coordination, Impaired UE functional use, Decreased endurance, Decreased activity tolerance, Decreased range of motion, Pain, Decreased strength  Visit Diagnosis: Muscle weakness (generalized)  Other lack of coordination    Problem List Patient Active Problem List   Diagnosis Date Noted  . HLD (hyperlipidemia) 02/27/2018  . Stroke ([place]) 02/27/2018  . GERD (gastroesophageal reflux disease) 02/27/2018  . Essential hypertension 02/27/2018  . 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 Neurocognitive disorder   . Left foot drop   . Benign essential HTN   . Hemiparesis affecting nondominant side as late effect of cerebrovascular accident (CVA) ([place]) 07/18/2016  . Right basal ganglia embolic stroke ([place]) 69/45/0388  . CVA (cerebral infarction) 07/15/2016  . TIA (transient ischemic attack) 07/14/2016  . HTN, goal below 140/80 07/14/2016  . Left facial numbness 07/14/2016  . Weakness of left side of body 07/14/2016   Nissi Doffing Oneita Jolly, OTR/L, CLT  Jason Oconnor 06/24/2018, 5:27 PM  [place] MAIN [facility] SERVICES [address] [place], [place], [address] Phone: [phone]   Fax:  [phone]  Name: Jason Oconnor MRN: [id number] Date of Birth: [birth year]

## 2018-06-24 NOTE — Therapy (Signed)
[place] MAIN [facility] SERVICES [address] [place], [place], [address] Phone: [phone]   Fax:  [phone]  Occupational Therapy Treatment  Patient Details  Name: Jason Oconnor MRN: [id number] Date of Birth: 1955/12/24 Referring Provider: Dr. Sharol Given   Encounter Date: 06/17/2018  OT End of Session - 06/24/18 1136    Visit Number  10    Number of Visits  24    Date for OT Re-Evaluation  06/23/18    Authorization Time Period  40 visit limit combined    OT Start Time  0930    OT Stop Time  1015    OT Time Calculation (min)  45 min    Activity Tolerance  Patient tolerated treatment well    Behavior During Therapy  [facility] for tasks assessed/performed       Past Medical History:  Diagnosis Date  . Bone marrow disease   . High cholesterol   . Hypertension   . Stroke [facility])     Past Surgical History:  Procedure Laterality Date  . ELBOW SURGERY Right    Tendinitis    There were no vitals filed for this visit.  Subjective Assessment - 06/24/18 1136    Subjective   Patient reports he finished with PT today and will just have OT from now on.  Patient reports he still notices weakness and incoordination in the ulnar side of the hand.    Pertinent History  Pt. is a 62 y.o. male wwith a history of a CVA. Pt. was admitted to [facility] on 02/27/2018 with a new onset of CVA, and left sided weakness. Pt. was admitted for one night. Imaging revealed: Acute 4 mm RIGHT inferior cerebellar infarct. New nonacute subcentimeter Left Inferior Cerebellar Infarct. Old Right Basal Ganglia Infarct. Remote Perforator Infarct affecting the right putemen, and corona..    Patient Stated Goals  To get back to doing as much as he can without fatigue    Currently in Pain?  Yes    Pain Score  1     Pain Location  Hip    Pain Orientation  Right;Left    Pain Descriptors / Indicators  Aching         Resistive Red putty exercises for targeting and  strengthening the ulnar side of the left hand, Digiflex 0.7 kgs for isolating ring and small finger Manipulation of Golf balls for promoting use of  ulnar side of the hand,  to roll ball up the ulnar side of the hand and back down.   Increased difficulty with this task and requires both verbal, tactile cues and therapist demo. Manipulation of Washers to place on long dowels in multi directional planes to promote ROM and reach added 2# weight to left shoulder  for increased strength and endurance with task.  Cues as needed for prehension patterns.  Manipulation of tiny, less than  inch washers pick up and move to palm, unable to move back to fingertips. Recommend purchasing Wrist weights 2# for home program for reaching and strengthening tasks.                    OT Education - 06/24/18 1136    Education provided  Yes    Education Details  fine motor coordination skills, exercises for ulnar side of the hand    Person(s) Educated  Patient    Methods  Explanation;Demonstration;Verbal cues    Comprehension  Returned demonstration;Verbalized understanding  OT Long Term Goals - 05/11/18 2047      OT LONG TERM GOAL #1   Title  Pt. will improve LUE strength by 1 mm grade to assist with ADLs, and IADLs.     Baseline  Eval: Limited LUE strength    Time  12    Period  Weeks    Status  On-going      OT LONG TERM GOAL #2   Title  Pt. will improve left grip strength by 10# to be able to hold a full glass.    Baseline  Eval: Left grip strength: 40#    Time  12    Period  Weeks    Status  On-going      OT LONG TERM GOAL #3   Title  Pt. will improve left hand [place] skills by 5 sec. to be able to perform buttoning    Baseline  Eval: Left hand limited [place], limited buttoning    Time  12    Period  Weeks    Status  On-going      OT LONG TERM GOAL #4   Title  Pt. will improve 3pt. pinch strength to be able to clip nails.    Baseline  Eval: Pt. has difficulty    Time   12    Period  Weeks    Status  On-going      OT LONG TERM GOAL #5   Title  Pt. will increase activity tolerance to be able to tolerate 20 min. of ADL/IADL activity without rest breaks.     Baseline  Eval: Pt. tolerate 5-10 min. of activity.    Time  12    Period  Weeks    Status  On-going            Plan - 06/24/18 1137    Clinical Impression Statement  Patient continues to progress in all areas, he is showing signs of increased ROM in left shoulder for more overhead reaching patterns, now able to use resistance with overhead reach, and is focusing on coordination of both sides of the hand.  Ulnar side of the hand demonstrates some weakness for gross grasp/power grip and for coordination.  Exercises this date to target this area with instructions for home.     Occupational performance deficits (Please refer to evaluation for details):  ADL's;IADL's       Patient will benefit from skilled therapeutic intervention in order to improve the following deficits and impairments:     Visit Diagnosis: Muscle weakness (generalized)  Other lack of coordination    Problem List Patient Active Problem List   Diagnosis Date Noted  . HLD (hyperlipidemia) 02/27/2018  . Stroke ([place]) 02/27/2018  . GERD (gastroesophageal reflux disease) 02/27/2018  . Essential hypertension 02/27/2018  . 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 Neurocognitive disorder   . Left foot drop   . Benign essential HTN   . Hemiparesis affecting nondominant side as late effect of cerebrovascular accident (CVA) ([place]) 07/18/2016  . Right basal ganglia embolic stroke ([place]) 47/65/4650  . CVA (cerebral infarction) 07/15/2016  . TIA (transient ischemic attack) 07/14/2016  . HTN, goal below 140/80 07/14/2016  . Left facial numbness 07/14/2016  . Weakness of left side of body 07/14/2016   Sabien Umland Oneita Jolly, OTR/L, CLT  Berlie Persky 06/24/2018, 11:38 AM  [place] MAIN [facility] SERVICES [place], [place], [address] Phone: [phone]   Fax:  [phone]  Name: Jason Oconnor MRN: [id number] Date of Birth: 12/19/1955

## 2018-07-01 ENCOUNTER — Ambulatory Visit: Payer: 59 | Admitting: Physical Therapy

## 2018-07-01 ENCOUNTER — Ambulatory Visit: Payer: 59 | Attending: Internal Medicine | Admitting: Occupational Therapy

## 2018-07-01 DIAGNOSIS — R278 Other lack of coordination: Secondary | ICD-10-CM | POA: Insufficient documentation

## 2018-07-01 DIAGNOSIS — M6281 Muscle weakness (generalized): Secondary | ICD-10-CM | POA: Diagnosis present

## 2018-07-01 MED ORDER — MULTI-VITAMINS PO TABS
1.00 | ORAL_TABLET | ORAL | Status: DC
Start: 2018-07-01 — End: 2018-07-01

## 2018-07-01 MED ORDER — CLOPIDOGREL BISULFATE 75 MG PO TABS
75.00 | ORAL_TABLET | ORAL | Status: DC
Start: 2018-07-01 — End: 2018-07-01

## 2018-07-01 MED ORDER — EZETIMIBE 10 MG PO TABS
10.00 | ORAL_TABLET | ORAL | Status: DC
Start: 2018-07-01 — End: 2018-07-01

## 2018-07-01 MED ORDER — SIMVASTATIN 20 MG PO TABS
20.00 | ORAL_TABLET | ORAL | Status: DC
Start: 2018-07-01 — End: 2018-07-01

## 2018-07-01 MED ORDER — ASPIRIN EC 81 MG PO TBEC
81.00 | DELAYED_RELEASE_TABLET | ORAL | Status: DC
Start: 2018-07-01 — End: 2018-07-01

## 2018-07-01 MED ORDER — AMLODIPINE BESYLATE 5 MG PO TABS
5.00 | ORAL_TABLET | ORAL | Status: DC
Start: 2018-07-01 — End: 2018-07-01

## 2018-07-01 NOTE — Therapy (Signed)
[place] MAIN [facility] SERVICES [address] [place], [place], [address] Phone: [phone]   Fax:  [phone]  Occupational Therapy Treatment  Patient Details  Name: Jason Oconnor MRN: [id number] Date of Birth: Mar 05, 1956 Referring Provider: Dr. Sharol Given   Encounter Date: 07/01/2018  OT End of Session - 07/07/18 0856    Visit Number  12    Number of Visits  24    Date for OT Re-Evaluation  08/12/18    Authorization Time Period  40 visit limit combined    OT Start Time  0925    OT Stop Time  1015    OT Time Calculation (min)  50 min    Activity Tolerance  Patient tolerated treatment well    Behavior During Therapy  [facility] for tasks assessed/performed       Past Medical History:  Diagnosis Date  . Bone marrow disease   . High cholesterol   . Hypertension   . Stroke [facility])     Past Surgical History:  Procedure Laterality Date  . ELBOW SURGERY Right    Tendinitis    There were no vitals filed for this visit.  Subjective Assessment - 07/07/18 0856    Subjective   Patient reports he had to go to the ER Monday night, he was feeling really fatigued and not sure if he was having symptoms of another stroke, he was checked out and all tests were negative and he was released.  He still feels tired and does not want to do any weights or heavy work today.    Pertinent History  Pt. is a 62 y.o. male wwith a history of a CVA. Pt. was admitted to [facility] on 02/27/2018 with a new onset of CVA, and left sided weakness. Pt. was admitted for one night. Imaging revealed: Acute 4 mm RIGHT inferior cerebellar infarct. New nonacute subcentimeter Left Inferior Cerebellar Infarct. Old Right Basal Ganglia Infarct. Remote Perforator Infarct affecting the right putemen, and corona..    Patient Stated Goals  To get back to doing as much as he can without fatigue    Currently in Pain?  No/denies    Pain Score  0-No pain          Patient seen for  resistive putty green, picking out small 1/2 inch items from the putty and placing into container.   Manipulation of small 1/2 cm in size perler beads with thumb and index then progressed into using forceps tool to pick up and place into grid.  Cues for prehension patterns and ways to pick up with tool.  Manipulation of toothpicks from tabletop to pick up and place into small holed container, cues for turning picks from a horizontal to vertical position  To place in container.                    OT Education - 07/07/18 0856    Education provided  Yes    Education Details  fine motor coordination    Person(s) Educated  Patient    Methods  Explanation;Demonstration;Verbal cues    Comprehension  Returned demonstration;Verbalized understanding          OT Long Term Goals - 07/01/18 0945      OT LONG TERM GOAL #1   Title  Pt. will improve LUE strength by 1 mm grade to assist with ADLs, and IADLs.     Baseline  Eval: Limited LUE strength, update 7/31 improved to 4/5 for  shoulder    Time  12    Period  Weeks    Status  On-going      OT LONG TERM GOAL #2   Title  Pt. will improve left grip strength by 10# to be able to hold a full glass.    Baseline  Eval: Left grip strength: 40#, update 7/31 45#    Time  12    Period  Weeks    Status  On-going      OT LONG TERM GOAL #3   Title  Pt. will improve left hand [place] skills by 5 sec. to be able to perform buttoning    Baseline  Eval: Left hand limited [place], limited buttoning, still has difficulty with small buttons    Time  12    Period  Weeks    Status  Achieved      OT LONG TERM GOAL #4   Title  Pt. will improve 3pt. pinch strength to be able to clip nails.    Baseline  Eval: Pt. has difficulty, update:  getting easier but still has difficulty performing    Time  12    Period  Weeks    Status  On-going      OT LONG TERM GOAL #5   Title  Pt. will increase activity tolerance to be able to tolerate 20 min. of ADL/IADL  activity without rest breaks.     Baseline  Eval: Pt. tolerate 5-10 min. of activity.    Time  12    Period  Weeks    Status  Achieved      Long Term Additional Goals   Additional Long Term Goals  Yes      OT LONG TERM GOAL #6   Title  Patient to improve grip to be able to hold and swing a golf club with stability.     Baseline  unable    Time  12    Period  Weeks    Status  New    Target Date  09/16/18            Plan - 07/07/18 0857    Clinical Impression Statement  Patient had to go to ER the other day with symptoms he thought was related to stroke, however, he was cleared and it may have been dehydration.  Reassessed patient this date and no changes noted.  Grip strength right 113#, left 53#.  Patient did not want to do anything strenous this date so focus was on fine motor coordination skills.  Patient continues to progress with functional hand use and participation in necessary daily tasks.       Occupational performance deficits (Please refer to evaluation for details):  ADL's;IADL's    Rehab Potential  Good    Current Impairments/barriers affecting progress:  Positive  Indicators: age, motivation, family support Negative Indicators: multiple comorbidities.    OT Frequency  1x / week    OT Duration  8 weeks    OT Treatment/Interventions  Self-care/ADL training;Therapeutic exercise;Therapeutic activities;Energy conservation;Neuromuscular education;Patient/family education    Consulted and Agree with Plan of Care  Patient       Patient will benefit from skilled therapeutic intervention in order to improve the following deficits and impairments:  Decreased coordination, Impaired UE functional use, Decreased endurance, Decreased activity tolerance, Decreased range of motion, Pain, Decreased strength  Visit Diagnosis: Muscle weakness (generalized)  Other lack of coordination    Problem List Patient Active Problem List   Diagnosis Date  Noted  . HLD (hyperlipidemia)  02/27/2018  . Stroke ([place]) 02/27/2018  . GERD (gastroesophageal reflux disease) 02/27/2018  . Essential hypertension 02/27/2018  . 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 Neurocognitive disorder   . Left foot drop   . Benign essential HTN   . Hemiparesis affecting nondominant side as late effect of cerebrovascular accident (CVA) ([place]) 07/18/2016  . Right basal ganglia embolic stroke ([place]) 95/05/2256  . CVA (cerebral infarction) 07/15/2016  . TIA (transient ischemic attack) 07/14/2016  . HTN, goal below 140/80 07/14/2016  . Left facial numbness 07/14/2016  . Weakness of left side of body 07/14/2016   Yunis Voorheis Oneita Jolly, OTR/L, CLT  Libero Puthoff 07/07/2018, 9:01 AM  [place] MAIN [facility] SERVICES [address] [place], [place], [address] Phone: [phone]   Fax:  [phone]  Name: Jason Oconnor MRN: [id number] Date of Birth: January 15, 1956

## 2018-07-07 ENCOUNTER — Ambulatory Visit: Payer: 59 | Admitting: Physical Therapy

## 2018-07-07 ENCOUNTER — Ambulatory Visit: Payer: 59 | Admitting: Occupational Therapy

## 2018-07-07 ENCOUNTER — Encounter: Payer: Self-pay | Admitting: Occupational Therapy

## 2018-07-07 DIAGNOSIS — M6281 Muscle weakness (generalized): Secondary | ICD-10-CM

## 2018-07-07 DIAGNOSIS — R278 Other lack of coordination: Secondary | ICD-10-CM

## 2018-07-07 NOTE — Therapy (Signed)
[place] MAIN [facility] SERVICES [address] [place], [place], [address] Phone: [phone]   Fax:  [phone]  Occupational Therapy Treatment  Patient Details  Name: Jason Oconnor MRN: [id number] Date of Birth: 1956-02-26 Referring Provider: Dr. Sharol Given   Encounter Date: 07/07/2018  OT End of Session - 07/07/18 1032    Visit Number  13    Number of Visits  24    Date for OT Re-Evaluation  08/12/18    Authorization Time Period  40 visit limit combined    OT Start Time  1015    OT Stop Time  1100    OT Time Calculation (min)  45 min    Activity Tolerance  Patient tolerated treatment well    Behavior During Therapy  [facility] for tasks assessed/performed       Past Medical History:  Diagnosis Date  . Bone marrow disease   . High cholesterol   . Hypertension   . Stroke [facility])     Past Surgical History:  Procedure Laterality Date  . ELBOW SURGERY Right    Tendinitis    There were no vitals filed for this visit.  Subjective Assessment - 07/07/18 1027    Subjective   Pt.'s wife accompanied pt. to the session today.    Pertinent History  Pt. is a 62 y.o. male wwith a history of a CVA. Pt. was admitted to [facility] on 02/27/2018 with a new onset of CVA, and left sided weakness. Pt. was admitted for one night. Imaging revealed: Acute 4 mm RIGHT inferior cerebellar infarct. New nonacute subcentimeter Left Inferior Cerebellar Infarct. Old Right Basal Ganglia Infarct. Remote Perforator Infarct affecting the right putemen, and corona..    Patient Stated Goals  To get back to doing as much as he can without fatigue    Currently in Pain?  No/denies      OT TREATMENT    Neuro muscular re-education:  Pt. performed [facility] skills training to improve speed and dexterity needed for ADL tasks and writing. Pt. demonstrated using his left hand to grasp 1 inch sticks,  inch cylindrical collars, and  inch flat washers on the Purdue pegboard. Pt. had  difficulty storing the purdue sticks in the palm of his hand. Pt. performed [facility] tasks using the Grooved pegboard. Pt. worked on grasping the grooved pegs from a horizontal position, and moving the pegs to a vertical position in the hand to prepare for placing them in the grooved slot. Pt. worked on translatory movements of the hand moving the objects from his palm to the tip of his 2nd digit, and thumb. Pt. worked on manipulating nuts, and bolts on a vertical tower while sustaining UEs in elevation during the task.                         OT Education - 07/07/18 1031    Education provided  Yes    Education Details  fine motor coordination    Person(s) Educated  Patient    Methods  Explanation;Demonstration;Verbal cues    Comprehension  Returned demonstration;Verbalized understanding          OT Long Term Goals - 07/01/18 0945      OT LONG TERM GOAL #1   Title  Pt. will improve LUE strength by 1 mm grade to assist with ADLs, and IADLs.     Baseline  Eval: Limited LUE strength, update 7/31 improved to 4/5 for shoulder  Time  12    Period  Weeks    Status  On-going      OT LONG TERM GOAL #2   Title  Pt. will improve left grip strength by 10# to be able to hold a full glass.    Baseline  Eval: Left grip strength: 40#, update 7/31 45#    Time  12    Period  Weeks    Status  On-going      OT LONG TERM GOAL #3   Title  Pt. will improve left hand [place] skills by 5 sec. to be able to perform buttoning    Baseline  Eval: Left hand limited [place], limited buttoning, still has difficulty with small buttons    Time  12    Period  Weeks    Status  Achieved      OT LONG TERM GOAL #4   Title  Pt. will improve 3pt. pinch strength to be able to clip nails.    Baseline  Eval: Pt. has difficulty, update:  getting easier but still has difficulty performing    Time  12    Period  Weeks    Status  On-going      OT LONG TERM GOAL #5   Title  Pt. will increase activity  tolerance to be able to tolerate 20 min. of ADL/IADL activity without rest breaks.     Baseline  Eval: Pt. tolerate 5-10 min. of activity.    Time  12    Period  Weeks    Status  Achieved      Long Term Additional Goals   Additional Long Term Goals  Yes      OT LONG TERM GOAL #6   Title  Patient to improve grip to be able to hold and swing a golf club with stability.     Baseline  unable    Time  12    Period  Weeks    Status  New    Target Date  09/16/18            Plan - 07/07/18 1033    Clinical Impression Statement  Pt. reports fluctuating symptoms of increased left sided weakness, tightness, and fatigue over this past week since his ER visit last week. Pt. reports that he has an appointment with Dr. Manuella Ghazi on Friday. Pt. requested to work on light ccordination tasks today, and avoid any strenuous activity today. Pt. Left grip strength: 53#.  Pt. continues to work on improving LUE [facility] skills in preparation for functional use during ADLs, and IADLs.    Occupational performance deficits (Please refer to evaluation for details):  ADL's;IADL's    Rehab Potential  Good    Current Impairments/barriers affecting progress:  Positive  Indicators: age, motivation, family support Negative Indicators: multiple comorbidities.    OT Frequency  1x / week    OT Duration  8 weeks    OT Treatment/Interventions  Self-care/ADL training;Therapeutic exercise;Therapeutic activities;Energy conservation;Neuromuscular education;Patient/family education    Clinical Decision Making  Several treatment options, min-mod task modification necessary    Consulted and Agree with Plan of Care  Patient       Patient will benefit from skilled therapeutic intervention in order to improve the following deficits and impairments:  Decreased coordination, Impaired UE functional use, Decreased endurance, Decreased activity tolerance, Decreased range of motion, Pain, Decreased strength  Visit Diagnosis: Muscle weakness  (generalized)  Other lack of coordination    Problem List Patient Active Problem List  Diagnosis Date Noted  . HLD (hyperlipidemia) 02/27/2018  . Stroke ([place]) 02/27/2018  . GERD (gastroesophageal reflux disease) 02/27/2018  . Essential hypertension 02/27/2018  . 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 Neurocognitive disorder   . Left foot drop   . Benign essential HTN   . Hemiparesis affecting nondominant side as late effect of cerebrovascular accident (CVA) ([place]) 07/18/2016  . Right basal ganglia embolic stroke ([place]) 34/19/6222  . CVA (cerebral infarction) 07/15/2016  . TIA (transient ischemic attack) 07/14/2016  . HTN, goal below 140/80 07/14/2016  . Left facial numbness 07/14/2016  . Weakness of left side of body 07/14/2016    Harrel Carina, MS, OTR/L 07/07/2018, 10:49 AM  [place] MAIN [facility] SERVICES [address] [place], [place], [address] Phone: [phone]   Fax:  [phone]  Name: Jason Oconnor MRN: [id number] Date of Birth: 1956/10/23

## 2018-07-14 ENCOUNTER — Ambulatory Visit: Payer: 59 | Admitting: Occupational Therapy

## 2018-07-15 ENCOUNTER — Ambulatory Visit: Payer: 59 | Admitting: Physical Therapy

## 2018-07-15 ENCOUNTER — Encounter: Payer: 59 | Admitting: Occupational Therapy

## 2018-07-22 ENCOUNTER — Encounter: Payer: Self-pay | Admitting: Occupational Therapy

## 2018-07-22 ENCOUNTER — Ambulatory Visit: Payer: 59 | Admitting: Physical Therapy

## 2018-07-22 ENCOUNTER — Ambulatory Visit: Payer: 59 | Admitting: Occupational Therapy

## 2018-07-22 DIAGNOSIS — R278 Other lack of coordination: Secondary | ICD-10-CM

## 2018-07-22 DIAGNOSIS — M6281 Muscle weakness (generalized): Secondary | ICD-10-CM | POA: Diagnosis not present

## 2018-07-25 NOTE — Therapy (Signed)
[place] MAIN [facility] SERVICES [address] [place], [place], [address] Phone: [phone]   Fax:  [phone]  Occupational Therapy Treatment  Patient Details  Name: Jason Oconnor MRN: [id number] Date of Birth: March 05, 1956 Referring Provider: Dr. Sharol Given   Encounter Date: 07/22/2018  OT End of Session - 07/25/18 0852    Visit Number  14    Number of Visits  24    Date for OT Re-Evaluation  08/12/18    Authorization Time Period  40 visit limit combined    OT Start Time  1015    OT Stop Time  1100    OT Time Calculation (min)  45 min    Activity Tolerance  Patient tolerated treatment well    Behavior During Therapy  [facility] for tasks assessed/performed       Past Medical History:  Diagnosis Date  . Bone marrow disease   . High cholesterol   . Hypertension   . Stroke [facility])     Past Surgical History:  Procedure Laterality Date  . ELBOW SURGERY Right    Tendinitis    There were no vitals filed for this visit.  Subjective Assessment - 07/25/18 0851    Subjective   Patient has been working at home on some wood working tasks, arm was fatigued for several hours yesterday after working.  Reports fine motor skills are still difficult.     Pertinent History  Pt. is a 62 y.o. male wwith a history of a CVA. Pt. was admitted to [facility] on 02/27/2018 with a new onset of CVA, and left sided weakness. Pt. was admitted for one night. Imaging revealed: Acute 4 mm RIGHT inferior cerebellar infarct. New nonacute subcentimeter Left Inferior Cerebellar Infarct. Old Right Basal Ganglia Infarct. Remote Perforator Infarct affecting the right putemen, and corona..    Patient Stated Goals  To get back to doing as much as he can without fatigue    Currently in Pain?  Yes    Pain Score  2     Pain Location  Neck    Pain Orientation  Left    Pain Descriptors / Indicators  Aching    Pain Type  Acute pain    Pain Onset  More than a month ago    Pain  Frequency  Intermittent           Patient seen for manipulation of Small objects,  inch in size, sorting and picking up to place into container, one by one  with cues for prehension patterns with left hand.  These items are too small for him to move to palm without items getting  stuck in the creases of his fingers.   Manipulation of Purdue pieces placed into magnetic bowl to increase resistance of picking up.  Patient working towards  moving items from fingertips to palm and using the hand for storage when picking up the next item.  Patient has more  difficulty with the small collars and washers with translatory movements of the hand due to their size and shape.                  OT Education - 07/25/18 [phone]    Education provided  Yes    Education Details  manipulation of small items less than 1/2 inch in size    Person(s) Educated  Patient    Methods  Explanation;Demonstration;Verbal cues    Comprehension  Returned demonstration;Verbalized understanding  OT Long Term Goals - 07/25/18 0856      OT LONG TERM GOAL #1   Title  Pt. will improve LUE strength by 1 mm grade to assist with ADLs, and IADLs.     Baseline  Eval: Limited LUE strength, update 7/31 improved to 4/5 for shoulder    Time  12    Period  Weeks    Status  On-going      OT LONG TERM GOAL #2   Title  Pt. will improve left grip strength by 10# to be able to hold a full glass.    Baseline  Eval: Left grip strength: 40#, update 7/31 45#    Time  12    Period  Weeks    Status  On-going      OT LONG TERM GOAL #3   Title  Pt. will improve left hand [place] skills by 5 sec. to be able to perform buttoning    Baseline  Eval: Left hand limited [place], limited buttoning, still has difficulty with small buttons    Time  12    Period  Weeks    Status  Achieved      OT LONG TERM GOAL #4   Title  Pt. will improve 3pt. pinch strength to be able to clip nails.    Baseline  Eval: Pt. has difficulty,  update:  getting easier but still has difficulty performing    Time  12    Period  Weeks    Status  On-going      OT LONG TERM GOAL #5   Title  Pt. will increase activity tolerance to be able to tolerate 20 min. of ADL/IADL activity without rest breaks.     Baseline  Eval: Pt. tolerate 5-10 min. of activity.    Time  12    Period  Weeks    Status  Achieved      OT LONG TERM GOAL #6   Title  Patient to improve grip to be able to hold and swing a golf club with stability.     Baseline  unable    Time  12    Period  Weeks    Status  New            Plan - 07/25/18 8341    Clinical Impression Statement  Patient continues to progress with fine motor coordination exercises, he feels he needs the most work in this area.  He still has some weakness in the left arm and shoulder but he engages in many activities at home that help with working on strength and would like to continue to work on hand function.  Manipulation of small items this date down to 1/4 inch in size which are more challenging, cues at times for prehension patterns and to avoid compensation at the shoulder, tends to elevate left shoulder when working on tasks but is more aware and able to self correct at times with posture and form.  Continue to work towards skills to improve independence in daily tasks.     Occupational performance deficits (Please refer to evaluation for details):  ADL's;IADL's    Rehab Potential  Good    Current Impairments/barriers affecting progress:  Positive  Indicators: age, motivation, family support Negative Indicators: multiple comorbidities.    OT Frequency  1x / week    OT Duration  8 weeks    OT Treatment/Interventions  Self-care/ADL training;Therapeutic exercise;Therapeutic activities;Energy conservation;Neuromuscular education;Patient/family education    Consulted and Agree with  Plan of Care  Patient       Patient will benefit from skilled therapeutic intervention in order to improve the  following deficits and impairments:  Decreased coordination, Impaired UE functional use, Decreased endurance, Decreased activity tolerance, Decreased range of motion, Pain, Decreased strength  Visit Diagnosis: Muscle weakness (generalized)  Other lack of coordination    Problem List Patient Active Problem List   Diagnosis Date Noted  . HLD (hyperlipidemia) 02/27/2018  . Stroke ([place]) 02/27/2018  . GERD (gastroesophageal reflux disease) 02/27/2018  . Essential hypertension 02/27/2018  . 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 Neurocognitive disorder   . Left foot drop   . Benign essential HTN   . Hemiparesis affecting nondominant side as late effect of cerebrovascular accident (CVA) ([place]) 07/18/2016  . Right basal ganglia embolic stroke ([place]) 42/68/3419  . CVA (cerebral infarction) 07/15/2016  . TIA (transient ischemic attack) 07/14/2016  . HTN, goal below 140/80 07/14/2016  . Left facial numbness 07/14/2016  . Weakness of left side of body 07/14/2016   [name] Oneita Jolly, OTR/L, CLT  [name],[name] 07/25/2018, 8:57 AM  [place] MAIN [facility] SERVICES [address] [place], [place], [address] Phone: [phone]   Fax:  [phone]  Name: Jason Oconnor MRN: [id number] Date of Birth: 10-07-1956

## 2018-07-28 ENCOUNTER — Encounter: Payer: Self-pay | Admitting: Occupational Therapy

## 2018-07-28 ENCOUNTER — Ambulatory Visit: Payer: 59 | Attending: Internal Medicine | Admitting: Occupational Therapy

## 2018-07-28 DIAGNOSIS — M6281 Muscle weakness (generalized): Secondary | ICD-10-CM | POA: Diagnosis present

## 2018-07-28 DIAGNOSIS — R278 Other lack of coordination: Secondary | ICD-10-CM | POA: Insufficient documentation

## 2018-07-28 NOTE — Therapy (Signed)
[place] MAIN [facility] SERVICES [address] [place], [place], [address] Phone: [phone]   Fax:  [phone]  Occupational Therapy Treatment  Patient Details  Name: Jason Oconnor MRN: [id number] Date of Birth: 1956/10/14 Referring Provider: Dr. Sharol Given   Encounter Date: 07/28/2018  OT End of Session - 07/31/18 1015    Visit Number  15    Number of Visits  24    Date for OT Re-Evaluation  08/12/18    Authorization Time Period  40 visit limit combined    OT Start Time  0930    OT Stop Time  1015    OT Time Calculation (min)  45 min    Activity Tolerance  Patient tolerated treatment well    Behavior During Therapy  [facility] for tasks assessed/performed       Past Medical History:  Diagnosis Date  . Bone marrow disease   . High cholesterol   . Hypertension   . Stroke [facility])     Past Surgical History:  Procedure Laterality Date  . ELBOW SURGERY Right    Tendinitis    There were no vitals filed for this visit.  Subjective Assessment - 07/31/18 1014    Subjective   Some stiffness noted in left shoulder but no pain.  Was walking really good the other day, but it comes and goes. Has been doing some wood working at home and using hands.      Pertinent History  Pt. is a 62 y.o. male wwith a history of a CVA. Pt. was admitted to [facility] on 02/27/2018 with a new onset of CVA, and left sided weakness. Pt. was admitted for one night. Imaging revealed: Acute 4 mm RIGHT inferior cerebellar infarct. New nonacute subcentimeter Left Inferior Cerebellar Infarct. Old Right Basal Ganglia Infarct. Remote Perforator Infarct affecting the right putemen, and corona..    Patient Stated Goals  To get back to doing as much as he can without fatigue    Currently in Pain?  No/denies    Pain Score  0-No pain         Patient seen for manipulation of nuts and bolts of various sizes (1/2 in or greater) to place and remove on elevated surface with cues for  thumb and finger combinations.   Long dowel sticks moving up and down with isolated finger movements, difficulty noted with isolated finger movements and  Coordination of movements between the thumb and fingers.    Manipulation of coins from flat tabletop, one at a time and using translatory movements of the hand to move coins to palm of hand And using hand for storage.  Able to hold up to 7 items at a time in palm without dropping.  Patient requires cues to not compensate at the Left shoulder and hike shoulder while engaging in task.  He also tends to use gravity to assist with getting coins to fingertips rather than Using thumb to move items to tips. Rest breaks as needed to complete task.      Manipulation of perler beads, small 1/4 inch in size putting in with left hand and then removing with tongs.                  OT Education - 07/31/18 1014    Education provided  Yes    Education Details  manipulation of small items less than 1/2 inch in size, HEP    Person(s) Educated  Patient    Methods  Explanation;Demonstration;Verbal cues    Comprehension  Returned demonstration;Verbalized understanding          OT Long Term Goals - 07/25/18 0856      OT LONG TERM GOAL #1   Title  Pt. will improve LUE strength by 1 mm grade to assist with ADLs, and IADLs.     Baseline  Eval: Limited LUE strength, update 7/31 improved to 4/5 for shoulder    Time  12    Period  Weeks    Status  On-going      OT LONG TERM GOAL #2   Title  Pt. will improve left grip strength by 10# to be able to hold a full glass.    Baseline  Eval: Left grip strength: 40#, update 7/31 45#    Time  12    Period  Weeks    Status  On-going      OT LONG TERM GOAL #3   Title  Pt. will improve left hand [place] skills by 5 sec. to be able to perform buttoning    Baseline  Eval: Left hand limited [place], limited buttoning, still has difficulty with small buttons    Time  12    Period  Weeks    Status  Achieved       OT LONG TERM GOAL #4   Title  Pt. will improve 3pt. pinch strength to be able to clip nails.    Baseline  Eval: Pt. has difficulty, update:  getting easier but still has difficulty performing    Time  12    Period  Weeks    Status  On-going      OT LONG TERM GOAL #5   Title  Pt. will increase activity tolerance to be able to tolerate 20 min. of ADL/IADL activity without rest breaks.     Baseline  Eval: Pt. tolerate 5-10 min. of activity.    Time  12    Period  Weeks    Status  Achieved      OT LONG TERM GOAL #6   Title  Patient to improve grip to be able to hold and swing a golf club with stability.     Baseline  unable    Time  12    Period  Weeks    Status  New            Plan - 07/31/18 1016    Clinical Impression Statement  Patient demonstrates difficulty at times with isolated finger movements and coordination of movements between the thumb and finger combinations.  Patient is more aware of compensating at the shoulder and tends to hike shoulder when performing tasks and requires occasional cues to adjust and isolate more of the movements distally. Continue to work towards goals in [place] to increase independence in daily tasks.     Occupational performance deficits (Please refer to evaluation for details):  ADL's;IADL's    Rehab Potential  Good    Current Impairments/barriers affecting progress:  Positive  Indicators: age, motivation, family support Negative Indicators: multiple comorbidities.    OT Frequency  1x / week    OT Duration  8 weeks    OT Treatment/Interventions  Self-care/ADL training;Therapeutic exercise;Therapeutic activities;Energy conservation;Neuromuscular education;Patient/family education    Consulted and Agree with Plan of Care  Patient       Patient will benefit from skilled therapeutic intervention in order to improve the following deficits and impairments:  Decreased coordination, Impaired UE functional use, Decreased endurance, Decreased  activity  tolerance, Decreased range of motion, Pain, Decreased strength  Visit Diagnosis: Muscle weakness (generalized)  Other lack of coordination    Problem List Patient Active Problem List   Diagnosis Date Noted  . HLD (hyperlipidemia) 02/27/2018  . Stroke ([place]) 02/27/2018  . GERD (gastroesophageal reflux disease) 02/27/2018  . Essential hypertension 02/27/2018  . 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 Neurocognitive disorder   . Left foot drop   . Benign essential HTN   . Hemiparesis affecting nondominant side as late effect of cerebrovascular accident (CVA) ([place]) 07/18/2016  . Right basal ganglia embolic stroke ([place]) 35/36/1443  . CVA (cerebral infarction) 07/15/2016  . TIA (transient ischemic attack) 07/14/2016  . HTN, goal below 140/80 07/14/2016  . Left facial numbness 07/14/2016  . Weakness of left side of body 07/14/2016   [name] Oneita Jolly, OTR/L, CLT  [name],[name] 07/31/2018, 10:20 AM  [place] MAIN [facility] SERVICES [address] [place], [place], [address] Phone: [phone]   Fax:  [phone]  Name: Demetrias Goodbar MRN: [id number] Date of Birth: 1956-03-29

## 2018-08-05 ENCOUNTER — Encounter: Payer: Self-pay | Admitting: Occupational Therapy

## 2018-08-05 ENCOUNTER — Ambulatory Visit: Payer: 59 | Admitting: Occupational Therapy

## 2018-08-05 DIAGNOSIS — M6281 Muscle weakness (generalized): Secondary | ICD-10-CM | POA: Diagnosis not present

## 2018-08-05 DIAGNOSIS — R278 Other lack of coordination: Secondary | ICD-10-CM

## 2018-08-05 NOTE — Therapy (Signed)
[place] MAIN [facility] SERVICES [address] [place], [place], [address] Phone: [phone]   Fax:  [phone]  Occupational Therapy Treatment  Patient Details  Name: Jason Oconnor MRN: [id number] Date of Birth: [birth year] Referring Provider: Dr. Sharol Given   Encounter Date: 08/05/2018  OT End of Session - 08/05/18 2120    Visit Number  16    Number of Visits  24    Date for OT Re-Evaluation  08/12/18    Authorization Time Period  40 visit limit combined    OT Start Time  0930    OT Stop Time  1015    OT Time Calculation (min)  45 min    Activity Tolerance  Patient tolerated treatment well    Behavior During Therapy  [facility] for tasks assessed/performed       Past Medical History:  Diagnosis Date  . Bone marrow disease   . High cholesterol   . Hypertension   . Stroke [facility])     Past Surgical History:  Procedure Laterality Date  . ELBOW SURGERY Right    Tendinitis    There were no vitals filed for this visit.  Subjective Assessment - 08/05/18 0931    Subjective   Had a sleep study on Monday to see if he has sleep apnea, will not get results until 10 days, didn't sleep well.  EEG last friday.     Pertinent History  Pt. is a 62 y.o. male wwith a history of a CVA. Pt. was admitted to [facility] on 02/27/2018 with a new onset of CVA, and left sided weakness. Pt. was admitted for one night. Imaging revealed: Acute 4 mm RIGHT inferior cerebellar infarct. New nonacute subcentimeter Left Inferior Cerebellar Infarct. Old Right Basal Ganglia Infarct. Remote Perforator Infarct affecting the right putemen, and corona..    Patient Stated Goals  To get back to doing as much as he can without fatigue    Currently in Pain?  Yes    Pain Score  3     Pain Location  Rib cage    Pain Orientation  Right    Pain Descriptors / Indicators  Aching    Pain Type  Acute pain    Pain Onset  Yesterday    Pain Frequency  Intermittent         Patient  reports he had to Walk a distance to get in today to the clinic, he does not like using the [place] parking.  He has been working on projects at home Patient seen for BUE strengthening tasks with use of 5# dowel for shoulder flexion, ABD/ADD, forwards and backwards circles, chest press for 2 sets of 15 reps each. 5# free weights for elbow flexion/extension for 2 sets of 15 repetitions, cues for technique.   2# free weights for triceps press for 3 sets of 10 reps, performed overhead method and then leaning over with push back.  Finger strengthening with use of push pins to place onto moderate resistive Bulletin board , difficulty noted at times with pushing pins straight in and fully in.  Manipulation of Small pin magnets to pull apart one handed with cues.                    OT Education - 08/05/18 2119    Education provided  Yes    Education Details  strengthening    Person(s) Educated  Patient    Methods  Explanation;Demonstration;Verbal cues    Comprehension  Returned demonstration;Verbalized understanding          OT Long Term Goals - 07/25/18 0856      OT LONG TERM GOAL #1   Title  Pt. will improve LUE strength by 1 mm grade to assist with ADLs, and IADLs.     Baseline  Eval: Limited LUE strength, update 7/31 improved to 4/5 for shoulder    Time  12    Period  Weeks    Status  On-going      OT LONG TERM GOAL #2   Title  Pt. will improve left grip strength by 10# to be able to hold a full glass.    Baseline  Eval: Left grip strength: 40#, update 7/31 45#    Time  12    Period  Weeks    Status  On-going      OT LONG TERM GOAL #3   Title  Pt. will improve left hand [place] skills by 5 sec. to be able to perform buttoning    Baseline  Eval: Left hand limited [place], limited buttoning, still has difficulty with small buttons    Time  12    Period  Weeks    Status  Achieved      OT LONG TERM GOAL #4   Title  Pt. will improve 3pt. pinch strength to be able to clip nails.     Baseline  Eval: Pt. has difficulty, update:  getting easier but still has difficulty performing    Time  12    Period  Weeks    Status  On-going      OT LONG TERM GOAL #5   Title  Pt. will increase activity tolerance to be able to tolerate 20 min. of ADL/IADL activity without rest breaks.     Baseline  Eval: Pt. tolerate 5-10 min. of activity.    Time  12    Period  Weeks    Status  Achieved      OT LONG TERM GOAL #6   Title  Patient to improve grip to be able to hold and swing a golf club with stability.     Baseline  unable    Time  12    Period  Weeks    Status  New            Plan - 08/05/18 2120    Clinical Impression Statement  Patient working towards improving strength this date, he is able to lift items that are heavy however, he has difficulty with repeated movements for strength and endurance, requires rest breaks at times.  Advised patient to work towards quality of movement rather than number of repetitions.  Patient requires cues at times for form and technique with exercises especially to not perform compensatory movements at the shoulder.  Continue to work towards goals to increase independence in daily tasks.     Occupational performance deficits (Please refer to evaluation for details):  ADL's;IADL's    Rehab Potential  Good    Current Impairments/barriers affecting progress:  Positive  Indicators: age, motivation, family support Negative Indicators: multiple comorbidities.    OT Frequency  1x / week    OT Duration  8 weeks    OT Treatment/Interventions  Self-care/ADL training;Therapeutic exercise;Therapeutic activities;Energy conservation;Neuromuscular education;Patient/family education    Consulted and Agree with Plan of Care  Patient       Patient will benefit from skilled therapeutic intervention in order to improve the following deficits and impairments:  Decreased coordination,  Impaired UE functional use, Decreased endurance, Decreased activity  tolerance, Decreased range of motion, Pain, Decreased strength  Visit Diagnosis: Muscle weakness (generalized)  Other lack of coordination    Problem List Patient Active Problem List   Diagnosis Date Noted  . HLD (hyperlipidemia) 02/27/2018  . Stroke ([place]) 02/27/2018  . GERD (gastroesophageal reflux disease) 02/27/2018  . Essential hypertension 02/27/2018  . 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 Neurocognitive disorder   . Left foot drop   . Benign essential HTN   . Hemiparesis affecting nondominant side as late effect of cerebrovascular accident (CVA) ([place]) 07/18/2016  . Right basal ganglia embolic stroke ([place]) 81/11/7508  . CVA (cerebral infarction) 07/15/2016  . TIA (transient ischemic attack) 07/14/2016  . HTN, goal below 140/80 07/14/2016  . Left facial numbness 07/14/2016  . Weakness of left side of body 07/14/2016   Lakoda Raske Oneita Jolly, OTR/L, CLT Donabelle Molden 08/05/2018, 10:17 PM  [place] MAIN [facility] SERVICES [address] [place], [place], [address] Phone: [phone]   Fax:  [phone]  Name: Jason Oconnor MRN: [id number] Date of Birth: [birth year]

## 2018-08-12 ENCOUNTER — Ambulatory Visit: Payer: 59 | Admitting: Occupational Therapy

## 2018-08-12 DIAGNOSIS — R278 Other lack of coordination: Secondary | ICD-10-CM

## 2018-08-12 DIAGNOSIS — M6281 Muscle weakness (generalized): Secondary | ICD-10-CM | POA: Diagnosis not present

## 2018-08-12 NOTE — Therapy (Signed)
[place] MAIN [facility] SERVICES [address] [place], [place], [address] Phone: [phone]   Fax:  [phone]  Occupational Therapy Treatment  Patient Details  Name: Jason Oconnor MRN: [id number] Date of Birth: Jan 03, 1956 No data recorded   Encounter Date: 08/12/2018  OT End of Session - 08/12/18 1520    Visit Number  17    Number of Visits  24    Date for OT Re-Evaluation  08/12/18    Authorization Time Period  40 visit limit combined    OT Start Time  0930       Past Medical History:  Diagnosis Date  . Bone marrow disease   . High cholesterol   . Hypertension   . Stroke [facility])     Past Surgical History:  Procedure Laterality Date  . ELBOW SURGERY Right    Tendinitis    There were no vitals filed for this visit.  Subjective Assessment - 08/21/18 1944    Subjective   Patient reports Lower back pain 1-2/10. He is trying to use the vacuum with the left hand at times and able to do one room with the left.  With groceries, he will hang up to 3 bags on left arm with elbow flexed and then occasionally uses cane in the right and other times the cane is not needed.      Pertinent History  Pt. is a 62 y.o. male wwith a history of a CVA. Pt. was admitted to [facility] on 02/27/2018 with a new onset of CVA, and left sided weakness. Pt. was admitted for one night. Imaging revealed: Acute 4 mm RIGHT inferior cerebellar infarct. New nonacute subcentimeter Left Inferior Cerebellar Infarct. Old Right Basal Ganglia Infarct. Remote Perforator Infarct affecting the right putemen, and corona..    Patient Stated Goals  To get back to doing as much as he can without fatigue    Currently in Pain?  Yes    Pain Score  2     Pain Location  Back    Pain Orientation  Lower    Pain Descriptors / Indicators  Aching    Pain Type  Acute pain    Pain Onset  Yesterday    Pain Frequency  Intermittent    Multiple Pain Sites  No          Reaching tasks  with left UE with 130 flexion with cones placing onto top of cabinet and removing for multiple sets,  13 cones for 5 sets place and remove Patient seen for finger prehension/strengthening with interlocking pieces which are  inch in size, various shapes with cues for prehension patterns and stabilizing to place together.  Manipulation of Glass beads, some with flat side up and others with flat side down with cues for prehension patterns, using the hand for storage and translatory skills of the hand.    Reassessment this date of grip and strength:  grip 52# L,  Lateral pinch left 25# 3 point pinch 16#, 2 point pinch 11#, 9 hole peg test 34 sec                  OT Education - 08/21/18 1945    Education provided  Yes    Education Details  HEP, [facility]    Person(s) Educated  Patient    Methods  Explanation;Demonstration;Verbal cues    Comprehension  Returned demonstration;Verbalized understanding          OT Long Term Goals - 07/25/18  2353      OT LONG TERM GOAL #1   Title  Pt. will improve LUE strength by 1 mm grade to assist with ADLs, and IADLs.     Baseline  Eval: Limited LUE strength, update 7/31 improved to 4/5 for shoulder    Time  12    Period  Weeks    Status  On-going      OT LONG TERM GOAL #2   Title  Pt. will improve left grip strength by 10# to be able to hold a full glass.    Baseline  Eval: Left grip strength: 40#, update 7/31 45#    Time  12    Period  Weeks    Status  On-going      OT LONG TERM GOAL #3   Title  Pt. will improve left hand [place] skills by 5 sec. to be able to perform buttoning    Baseline  Eval: Left hand limited [place], limited buttoning, still has difficulty with small buttons    Time  12    Period  Weeks    Status  Achieved      OT LONG TERM GOAL #4   Title  Pt. will improve 3pt. pinch strength to be able to clip nails.    Baseline  Eval: Pt. has difficulty, update:  getting easier but still has difficulty performing    Time  12     Period  Weeks    Status  On-going      OT LONG TERM GOAL #5   Title  Pt. will increase activity tolerance to be able to tolerate 20 min. of ADL/IADL activity without rest breaks.     Baseline  Eval: Pt. tolerate 5-10 min. of activity.    Time  12    Period  Weeks    Status  Achieved      OT LONG TERM GOAL #6   Title  Patient to improve grip to be able to hold and swing a golf club with stability.     Baseline  unable    Time  12    Period  Weeks    Status  New            Plan - 08/21/18 1946    Clinical Impression Statement  Patient has continued to progress in all areas as noted by his reassessment in grip, pinch and coordination skills today.  He would like to focus on strength and shoulder stability and push a little harder in his sessions.  Will likely discharge patient in the next couple sessions.  Will update home exercise program next session.      Occupational performance deficits (Please refer to evaluation for details):  ADL's;IADL's    Rehab Potential  Good    Current Impairments/barriers affecting progress:  Positive  Indicators: age, motivation, family support Negative Indicators: multiple comorbidities.    OT Frequency  1x / week    OT Duration  8 weeks    OT Treatment/Interventions  Self-care/ADL training;Therapeutic exercise;Therapeutic activities;Energy conservation;Neuromuscular education;Patient/family education    Consulted and Agree with Plan of Care  Patient       Patient will benefit from skilled therapeutic intervention in order to improve the following deficits and impairments:  Decreased coordination, Impaired UE functional use, Decreased endurance, Decreased activity tolerance, Decreased range of motion, Pain, Decreased strength  Visit Diagnosis: Muscle weakness (generalized)  Other lack of coordination    Problem List Patient Active Problem List   Diagnosis  Date Noted  . HLD (hyperlipidemia) 02/27/2018  . Stroke ([place]) 02/27/2018  . GERD  (gastroesophageal reflux disease) 02/27/2018  . Essential hypertension 02/27/2018  . 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 Neurocognitive disorder   . Left foot drop   . Benign essential HTN   . Hemiparesis affecting nondominant side as late effect of cerebrovascular accident (CVA) ([place]) 07/18/2016  . Right basal ganglia embolic stroke ([place]) 15/18/3437  . CVA (cerebral infarction) 07/15/2016  . TIA (transient ischemic attack) 07/14/2016  . HTN, goal below 140/80 07/14/2016  . Left facial numbness 07/14/2016  . Weakness of left side of body 07/14/2016   Arnola Crittendon Oneita Jolly, OTR/L, CLT  Marian Meneely 08/21/2018, 7:49 PM  [place] MAIN [facility] SERVICES [address] [place], [place], [address] Phone: [phone]   Fax:  [phone]  Name: Jason Oconnor MRN: [id number] Date of Birth: May 26, 1956

## 2018-08-19 ENCOUNTER — Ambulatory Visit: Payer: 59 | Admitting: Occupational Therapy

## 2018-08-19 DIAGNOSIS — M6281 Muscle weakness (generalized): Secondary | ICD-10-CM

## 2018-08-19 DIAGNOSIS — R278 Other lack of coordination: Secondary | ICD-10-CM

## 2018-08-22 NOTE — Therapy (Addendum)
[place] MAIN [facility] SERVICES [address] [place], [place], [address] Phone: [phone]   Fax:  [phone]  Occupational Therapy Treatment/Discharge Summary  Patient Details  Name: Jason Oconnor MRN: [id number] Date of Birth: 01-Dec-1955 No data recorded  Encounter Date: 08/19/2018  OT End of Session - 08/27/18 1639    Visit Number  18    Number of Visits  24    Date for OT Re-Evaluation  08/19/18    Authorization Time Period  40 visit limit combined    OT Start Time  0930    OT Stop Time  1015    OT Time Calculation (min)  45 min    Activity Tolerance  Patient tolerated treatment well    Behavior During Therapy  [facility] for tasks assessed/performed       Past Medical History:  Diagnosis Date  . Bone marrow disease   . High cholesterol   . Hypertension   . Stroke [facility])     Past Surgical History:  Procedure Laterality Date  . ELBOW SURGERY Right    Tendinitis    There were no vitals filed for this visit.  Subjective Assessment - 08/27/18 1638    Subjective   Patient reports he worked on painting a lamp post at home and also on some of his woodworking.  He reports he feels he can continue with exercises at home and today will be his last day of therapy.    Pertinent History  Pt. is a 62 y.o. male wwith a history of a CVA. Pt. was admitted to [facility] on 02/27/2018 with a new onset of CVA, and left sided weakness. Pt. was admitted for one night. Imaging revealed: Acute 4 mm RIGHT inferior cerebellar infarct. New nonacute subcentimeter Left Inferior Cerebellar Infarct. Old Right Basal Ganglia Infarct. Remote Perforator Infarct affecting the right putemen, and corona..    Patient Stated Goals  To get back to doing as much as he can without fatigue    Currently in Pain?  Yes    Pain Score  2     Pain Location  Back    Pain Orientation  Lower    Pain Descriptors / Indicators  Aching    Pain Type  Acute pain    Pain Onset   Yesterday    Pain Frequency  Intermittent          Patient seen for strengthening with 1# reach to shape tower placed in elevated position, some in sitting and others in standing.  Patient seen for finger strengthening with 1/4 inch size objects which snap together.   Reassessment this date of grip and strength:  grip 65# L,  Lateral pinch left 25# 3 point pinch 16#, 2 point pinch 11#, 9 hole peg test 30 sec Pt able to clip nails now and has met this goal He has not had much time to work on golf club use and is planning to do it this week.   Instruction on HEP                 OT Education - 08/27/18 1639    Education provided  Yes    Education Details  HEP, [facility]    Person(s) Educated  Patient    Methods  Explanation;Demonstration;Verbal cues    Comprehension  Returned demonstration;Verbalized understanding          OT Long Term Goals - 08/27/18 1643      OT LONG TERM GOAL #  1   Title  Pt. will improve LUE strength by 1 mm grade to assist with ADLs, and IADLs.     Baseline  Eval: Limited LUE strength, update 7/31 improved to 4/5 for shoulder    Time  12    Period  Weeks    Status  Achieved      OT LONG TERM GOAL #2   Title  Pt. will improve left grip strength by 10# to be able to hold a full glass.    Baseline  Eval: Left grip strength: 40#, update 7/31 45#    Time  12    Period  Weeks    Status  Achieved      OT LONG TERM GOAL #3   Title  Pt. will improve left hand [facility] skills by 5 sec. to be able to perform buttoning    Baseline  Eval: Left hand limited [place], limited buttoning, still has difficulty with small buttons    Time  12    Period  Weeks    Status  Achieved      OT LONG TERM GOAL #4   Title  Pt. will improve 3pt. pinch strength to be able to clip nails.    Baseline  Eval: Pt. has difficulty, update:  getting easier but still has difficulty performing    Time  12    Period  Weeks    Status  Achieved      OT LONG TERM GOAL #5   Title  Pt.  will increase activity tolerance to be able to tolerate 20 min. of ADL/IADL activity without rest breaks.     Baseline  Eval: Pt. tolerate 5-10 min. of activity.    Time  12    Period  Weeks    Status  Achieved      OT LONG TERM GOAL #6   Title  Patient to improve grip to be able to hold and swing a golf club with stability.     Baseline  has not had time to work on this goal consistently    Time  12    Period  Weeks    Status  Partially Met            Plan - 08/27/18 1641    Clinical Impression Statement  Patient has continued to make progress in all areas and feels he is ready for discharge from therapy, he feels can follow up with his exercises at home and save a few visits in case he needs them for the remainder of the year.  He demonstrates independence in home exercise program , has met his goals and is active at home with woodworking and homemaking tasks.  Will plan to discharge at this time.     Occupational performance deficits (Please refer to evaluation for details):  ADL's;IADL's    Rehab Potential  Good    Current Impairments/barriers affecting progress:  Positive  Indicators: age, motivation, family support Negative Indicators: multiple comorbidities.    OT Frequency  1x / week    OT Duration  8 weeks    OT Treatment/Interventions  Self-care/ADL training;Therapeutic exercise;Therapeutic activities;Energy conservation;Neuromuscular education;Patient/family education    Consulted and Agree with Plan of Care  Patient       Patient will benefit from skilled therapeutic intervention in order to improve the following deficits and impairments:  Decreased coordination, Impaired UE functional use, Decreased endurance, Decreased activity tolerance, Decreased range of motion, Pain, Decreased strength  Visit Diagnosis: Muscle weakness (generalized)  Other lack of coordination    Problem List Patient Active Problem List   Diagnosis Date Noted  . HLD (hyperlipidemia)  02/27/2018  . Stroke ([place]) 02/27/2018  . GERD (gastroesophageal reflux disease) 02/27/2018  . Essential hypertension 02/27/2018  . 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 Neurocognitive disorder   . Left foot drop   . Benign essential HTN   . Hemiparesis affecting nondominant side as late effect of cerebrovascular accident (CVA) ([place]) 07/18/2016  . Right basal ganglia embolic stroke ([place]) 64/33/2951  . CVA (cerebral infarction) 07/15/2016  . TIA (transient ischemic attack) 07/14/2016  . HTN, goal below 140/80 07/14/2016  . Left facial numbness 07/14/2016  . Weakness of left side of body 07/14/2016   Jason Oconnor, OTR/L, CLT  Jason Oconnor 08/27/2018, 4:45 PM  [place] MAIN [facility] SERVICES [address] [place], [place], [address] Phone: [phone]   Fax:  [phone]  Name: Jason Oconnor MRN: [id number] Date of Birth: [birth year]

## 2018-08-26 ENCOUNTER — Encounter: Payer: 59 | Admitting: Occupational Therapy

## 2018-08-27 NOTE — Addendum Note (Signed)
Addended by: Garlon Hatchet T on: 08/27/2018 04:52 PM   Modules accepted: Orders

## 2018-09-02 ENCOUNTER — Encounter: Payer: 59 | Admitting: Occupational Therapy

## 2018-09-08 ENCOUNTER — Encounter: Payer: 59 | Admitting: Occupational Therapy

## 2018-09-09 ENCOUNTER — Encounter: Payer: 59 | Admitting: Occupational Therapy

## 2018-09-16 ENCOUNTER — Encounter: Payer: 59 | Admitting: Occupational Therapy

## 2018-09-23 ENCOUNTER — Encounter: Payer: 59 | Admitting: Occupational Therapy

## 2018-12-31 DIAGNOSIS — I639 Cerebral infarction, unspecified: Secondary | ICD-10-CM | POA: Diagnosis not present

## 2018-12-31 DIAGNOSIS — Z95818 Presence of other cardiac implants and grafts: Secondary | ICD-10-CM | POA: Diagnosis not present

## 2019-01-13 DIAGNOSIS — R7309 Other abnormal glucose: Secondary | ICD-10-CM | POA: Diagnosis not present

## 2019-01-13 DIAGNOSIS — E7849 Other hyperlipidemia: Secondary | ICD-10-CM | POA: Diagnosis not present

## 2019-01-13 DIAGNOSIS — Z125 Encounter for screening for malignant neoplasm of prostate: Secondary | ICD-10-CM | POA: Diagnosis not present

## 2019-01-13 DIAGNOSIS — I10 Essential (primary) hypertension: Secondary | ICD-10-CM | POA: Diagnosis not present

## 2019-01-21 DIAGNOSIS — Z1331 Encounter for screening for depression: Secondary | ICD-10-CM | POA: Diagnosis not present

## 2019-01-21 DIAGNOSIS — R7309 Other abnormal glucose: Secondary | ICD-10-CM | POA: Diagnosis not present

## 2019-01-21 DIAGNOSIS — H02409 Unspecified ptosis of unspecified eyelid: Secondary | ICD-10-CM | POA: Diagnosis not present

## 2019-01-21 DIAGNOSIS — E7849 Other hyperlipidemia: Secondary | ICD-10-CM | POA: Diagnosis not present

## 2019-01-21 DIAGNOSIS — R209 Unspecified disturbances of skin sensation: Secondary | ICD-10-CM | POA: Diagnosis not present

## 2019-01-21 DIAGNOSIS — H9312 Tinnitus, left ear: Secondary | ICD-10-CM | POA: Diagnosis not present

## 2019-01-21 DIAGNOSIS — I10 Essential (primary) hypertension: Secondary | ICD-10-CM | POA: Diagnosis not present

## 2019-01-21 DIAGNOSIS — Z0000 Encounter for general adult medical examination without abnormal findings: Secondary | ICD-10-CM | POA: Diagnosis not present

## 2019-01-21 DIAGNOSIS — Z1339 Encounter for screening examination for other mental health and behavioral disorders: Secondary | ICD-10-CM | POA: Diagnosis not present

## 2019-01-21 DIAGNOSIS — I471 Supraventricular tachycardia: Secondary | ICD-10-CM | POA: Diagnosis not present

## 2019-01-21 DIAGNOSIS — I69354 Hemiplegia and hemiparesis following cerebral infarction affecting left non-dominant side: Secondary | ICD-10-CM | POA: Diagnosis not present

## 2019-01-25 DIAGNOSIS — Z1212 Encounter for screening for malignant neoplasm of rectum: Secondary | ICD-10-CM | POA: Diagnosis not present

## 2019-02-02 DIAGNOSIS — I639 Cerebral infarction, unspecified: Secondary | ICD-10-CM | POA: Diagnosis not present

## 2019-02-02 DIAGNOSIS — Z95818 Presence of other cardiac implants and grafts: Secondary | ICD-10-CM | POA: Diagnosis not present

## 2019-03-08 DIAGNOSIS — I639 Cerebral infarction, unspecified: Secondary | ICD-10-CM | POA: Diagnosis not present

## 2019-03-08 DIAGNOSIS — Z95818 Presence of other cardiac implants and grafts: Secondary | ICD-10-CM | POA: Diagnosis not present

## 2019-04-28 DIAGNOSIS — Z95818 Presence of other cardiac implants and grafts: Secondary | ICD-10-CM | POA: Diagnosis not present

## 2019-04-28 DIAGNOSIS — I639 Cerebral infarction, unspecified: Secondary | ICD-10-CM | POA: Diagnosis not present

## 2019-05-06 DIAGNOSIS — M5416 Radiculopathy, lumbar region: Secondary | ICD-10-CM | POA: Diagnosis not present

## 2019-05-07 ENCOUNTER — Other Ambulatory Visit (HOSPITAL_COMMUNITY): Payer: Self-pay | Admitting: Neurology

## 2019-05-07 ENCOUNTER — Other Ambulatory Visit: Payer: Self-pay | Admitting: Neurology

## 2019-05-07 DIAGNOSIS — M5416 Radiculopathy, lumbar region: Secondary | ICD-10-CM

## 2019-05-24 ENCOUNTER — Other Ambulatory Visit: Payer: Self-pay

## 2019-05-24 ENCOUNTER — Ambulatory Visit
Admission: RE | Admit: 2019-05-24 | Discharge: 2019-05-24 | Disposition: A | Discharge status: Home / Self Care | Payer: Medicare Other | Source: Ambulatory Visit | Attending: Neurology | Admitting: Neurology

## 2019-05-24 DIAGNOSIS — M5416 Radiculopathy, lumbar region: Secondary | ICD-10-CM | POA: Insufficient documentation

## 2019-05-24 DIAGNOSIS — M545 Low back pain: Secondary | ICD-10-CM | POA: Diagnosis not present

## 2019-05-31 DIAGNOSIS — Z95818 Presence of other cardiac implants and grafts: Secondary | ICD-10-CM | POA: Diagnosis not present

## 2019-05-31 DIAGNOSIS — I639 Cerebral infarction, unspecified: Secondary | ICD-10-CM | POA: Diagnosis not present

## 2019-06-30 DIAGNOSIS — I63532 Cerebral infarction due to unspecified occlusion or stenosis of left posterior cerebral artery: Secondary | ICD-10-CM | POA: Diagnosis not present

## 2019-06-30 DIAGNOSIS — I10 Essential (primary) hypertension: Secondary | ICD-10-CM | POA: Diagnosis not present

## 2019-07-06 DIAGNOSIS — Z95818 Presence of other cardiac implants and grafts: Secondary | ICD-10-CM | POA: Diagnosis not present

## 2019-07-06 DIAGNOSIS — I639 Cerebral infarction, unspecified: Secondary | ICD-10-CM | POA: Diagnosis not present

## 2019-07-16 ENCOUNTER — Other Ambulatory Visit (HOSPITAL_COMMUNITY): Payer: Self-pay | Admitting: Internal Medicine

## 2019-07-16 ENCOUNTER — Other Ambulatory Visit: Payer: Self-pay

## 2019-07-16 ENCOUNTER — Ambulatory Visit (HOSPITAL_COMMUNITY)
Admission: RE | Admit: 2019-07-16 | Discharge: 2019-07-16 | Disposition: A | Discharge status: Home / Self Care | Payer: Medicare Other | Source: Ambulatory Visit | Attending: Family | Admitting: Family

## 2019-07-16 DIAGNOSIS — M79662 Pain in left lower leg: Secondary | ICD-10-CM | POA: Diagnosis not present

## 2019-07-26 DIAGNOSIS — M79673 Pain in unspecified foot: Secondary | ICD-10-CM | POA: Diagnosis not present

## 2019-07-26 DIAGNOSIS — E785 Hyperlipidemia, unspecified: Secondary | ICD-10-CM | POA: Diagnosis not present

## 2019-07-26 DIAGNOSIS — K635 Polyp of colon: Secondary | ICD-10-CM | POA: Diagnosis not present

## 2019-07-26 DIAGNOSIS — I8291 Chronic embolism and thrombosis of unspecified vein: Secondary | ICD-10-CM | POA: Diagnosis not present

## 2019-07-26 DIAGNOSIS — I69354 Hemiplegia and hemiparesis following cerebral infarction affecting left non-dominant side: Secondary | ICD-10-CM | POA: Diagnosis not present

## 2019-07-26 DIAGNOSIS — Z95818 Presence of other cardiac implants and grafts: Secondary | ICD-10-CM | POA: Diagnosis not present

## 2019-07-26 DIAGNOSIS — I6990 Unspecified sequelae of unspecified cerebrovascular disease: Secondary | ICD-10-CM | POA: Diagnosis not present

## 2019-07-26 DIAGNOSIS — I10 Essential (primary) hypertension: Secondary | ICD-10-CM | POA: Diagnosis not present

## 2019-07-26 DIAGNOSIS — D472 Monoclonal gammopathy: Secondary | ICD-10-CM | POA: Diagnosis not present

## 2019-07-26 DIAGNOSIS — M545 Low back pain: Secondary | ICD-10-CM | POA: Diagnosis not present

## 2019-07-26 DIAGNOSIS — M21372 Foot drop, left foot: Secondary | ICD-10-CM | POA: Diagnosis not present

## 2019-07-29 DIAGNOSIS — E785 Hyperlipidemia, unspecified: Secondary | ICD-10-CM | POA: Diagnosis not present

## 2019-07-29 DIAGNOSIS — E7849 Other hyperlipidemia: Secondary | ICD-10-CM | POA: Diagnosis not present

## 2019-08-03 DIAGNOSIS — I63532 Cerebral infarction due to unspecified occlusion or stenosis of left posterior cerebral artery: Secondary | ICD-10-CM | POA: Diagnosis not present

## 2019-09-28 DIAGNOSIS — L218 Other seborrheic dermatitis: Secondary | ICD-10-CM | POA: Diagnosis not present

## 2019-09-28 DIAGNOSIS — D1801 Hemangioma of skin and subcutaneous tissue: Secondary | ICD-10-CM | POA: Diagnosis not present

## 2019-09-28 DIAGNOSIS — L812 Freckles: Secondary | ICD-10-CM | POA: Diagnosis not present

## 2019-09-28 DIAGNOSIS — D225 Melanocytic nevi of trunk: Secondary | ICD-10-CM | POA: Diagnosis not present

## 2019-09-28 DIAGNOSIS — Z1283 Encounter for screening for malignant neoplasm of skin: Secondary | ICD-10-CM | POA: Diagnosis not present

## 2019-09-28 DIAGNOSIS — L820 Inflamed seborrheic keratosis: Secondary | ICD-10-CM | POA: Diagnosis not present

## 2019-09-28 DIAGNOSIS — L821 Other seborrheic keratosis: Secondary | ICD-10-CM | POA: Diagnosis not present

## 2019-11-03 DIAGNOSIS — I10 Essential (primary) hypertension: Secondary | ICD-10-CM | POA: Diagnosis not present

## 2020-01-21 DIAGNOSIS — H748X3 Other specified disorders of middle ear and mastoid, bilateral: Secondary | ICD-10-CM | POA: Diagnosis not present

## 2020-01-21 DIAGNOSIS — R2689 Other abnormalities of gait and mobility: Secondary | ICD-10-CM | POA: Diagnosis not present

## 2020-01-21 DIAGNOSIS — R42 Dizziness and giddiness: Secondary | ICD-10-CM | POA: Diagnosis not present

## 2020-01-21 DIAGNOSIS — I10 Essential (primary) hypertension: Secondary | ICD-10-CM | POA: Diagnosis not present

## 2020-01-21 DIAGNOSIS — Z125 Encounter for screening for malignant neoplasm of prostate: Secondary | ICD-10-CM | POA: Diagnosis not present

## 2020-01-21 DIAGNOSIS — R739 Hyperglycemia, unspecified: Secondary | ICD-10-CM | POA: Diagnosis not present

## 2020-01-21 DIAGNOSIS — I69354 Hemiplegia and hemiparesis following cerebral infarction affecting left non-dominant side: Secondary | ICD-10-CM | POA: Diagnosis not present

## 2020-01-21 DIAGNOSIS — E7849 Other hyperlipidemia: Secondary | ICD-10-CM | POA: Diagnosis not present

## 2020-01-28 DIAGNOSIS — M79673 Pain in unspecified foot: Secondary | ICD-10-CM | POA: Diagnosis not present

## 2020-01-28 DIAGNOSIS — K635 Polyp of colon: Secondary | ICD-10-CM | POA: Diagnosis not present

## 2020-01-28 DIAGNOSIS — Z0000 Encounter for general adult medical examination without abnormal findings: Secondary | ICD-10-CM | POA: Diagnosis not present

## 2020-01-28 DIAGNOSIS — I8291 Chronic embolism and thrombosis of unspecified vein: Secondary | ICD-10-CM | POA: Diagnosis not present

## 2020-01-28 DIAGNOSIS — R42 Dizziness and giddiness: Secondary | ICD-10-CM | POA: Diagnosis not present

## 2020-01-28 DIAGNOSIS — I69354 Hemiplegia and hemiparesis following cerebral infarction affecting left non-dominant side: Secondary | ICD-10-CM | POA: Diagnosis not present

## 2020-01-28 DIAGNOSIS — H748X3 Other specified disorders of middle ear and mastoid, bilateral: Secondary | ICD-10-CM | POA: Diagnosis not present

## 2020-01-28 DIAGNOSIS — H02409 Unspecified ptosis of unspecified eyelid: Secondary | ICD-10-CM | POA: Diagnosis not present

## 2020-01-28 DIAGNOSIS — R82998 Other abnormal findings in urine: Secondary | ICD-10-CM | POA: Diagnosis not present

## 2020-01-28 DIAGNOSIS — I6990 Unspecified sequelae of unspecified cerebrovascular disease: Secondary | ICD-10-CM | POA: Diagnosis not present

## 2020-01-28 DIAGNOSIS — M21372 Foot drop, left foot: Secondary | ICD-10-CM | POA: Diagnosis not present

## 2020-01-28 DIAGNOSIS — I10 Essential (primary) hypertension: Secondary | ICD-10-CM | POA: Diagnosis not present

## 2020-01-28 DIAGNOSIS — H9312 Tinnitus, left ear: Secondary | ICD-10-CM | POA: Diagnosis not present

## 2020-02-05 ENCOUNTER — Ambulatory Visit: Payer: Medicare Other | Attending: Internal Medicine

## 2020-02-05 DIAGNOSIS — Z23 Encounter for immunization: Secondary | ICD-10-CM

## 2020-02-05 NOTE — Progress Notes (Signed)
   Covid-19 Vaccination Clinic  Name:  Jason Oconnor    MRN: [id number] DOB: 17-Jun-1956  02/05/2020  [name] was observed post Covid-19 immunization for 15 minutes without incident. He was provided with Vaccine Information Sheet and instruction to access the V-Safe system.   [name] was instructed to call 911 with any severe reactions post vaccine: Marland Kitchen Difficulty breathing  . Swelling of face and throat  . A fast heartbeat  . A bad rash all over body  . Dizziness and weakness   Immunizations Administered    Name Date Dose VIS Date Route   Pfizer COVID-19 Vaccine 02/05/2020  9:15 AM 0.3 mL 11/05/2019 Intramuscular   Manufacturer: [place]   Lot: [id number]   [place]: [id number]

## 2020-02-18 DIAGNOSIS — Z1212 Encounter for screening for malignant neoplasm of rectum: Secondary | ICD-10-CM | POA: Diagnosis not present

## 2020-03-01 ENCOUNTER — Ambulatory Visit: Payer: 59 | Attending: Internal Medicine

## 2020-03-01 DIAGNOSIS — Z23 Encounter for immunization: Secondary | ICD-10-CM

## 2020-03-01 NOTE — Progress Notes (Signed)
   Covid-19 Vaccination Clinic  Name:  Jason Oconnor    MRN: [id number] DOB: 1956-01-28  03/01/2020  Mr. Jason Oconnor was observed post Covid-19 immunization for 15 minutes without incident. He was provided with Vaccine Information Sheet and instruction to access the V-Safe system.   Mr. Jason Oconnor was instructed to call 911 with any severe reactions post vaccine: Marland Kitchen Difficulty breathing  . Swelling of face and throat  . A fast heartbeat  . A bad rash all over body  . Dizziness and weakness   Immunizations Administered    Name Date Dose VIS Date Route   Pfizer COVID-19 Vaccine 03/01/2020  9:02 AM 0.3 mL 11/05/2019 Intramuscular   Manufacturer: [place]   Lot: [id number]   [place]: [id number]

## 2020-07-28 DIAGNOSIS — R739 Hyperglycemia, unspecified: Secondary | ICD-10-CM | POA: Diagnosis not present

## 2020-07-28 DIAGNOSIS — R3914 Feeling of incomplete bladder emptying: Secondary | ICD-10-CM | POA: Diagnosis not present

## 2020-07-28 DIAGNOSIS — K219 Gastro-esophageal reflux disease without esophagitis: Secondary | ICD-10-CM | POA: Diagnosis not present

## 2020-07-28 DIAGNOSIS — I10 Essential (primary) hypertension: Secondary | ICD-10-CM | POA: Diagnosis not present

## 2020-07-28 DIAGNOSIS — R2689 Other abnormalities of gait and mobility: Secondary | ICD-10-CM | POA: Diagnosis not present

## 2020-07-28 DIAGNOSIS — D472 Monoclonal gammopathy: Secondary | ICD-10-CM | POA: Diagnosis not present

## 2020-07-28 DIAGNOSIS — K5900 Constipation, unspecified: Secondary | ICD-10-CM | POA: Diagnosis not present

## 2020-07-28 DIAGNOSIS — I6990 Unspecified sequelae of unspecified cerebrovascular disease: Secondary | ICD-10-CM | POA: Diagnosis not present

## 2020-07-28 DIAGNOSIS — R209 Unspecified disturbances of skin sensation: Secondary | ICD-10-CM | POA: Diagnosis not present

## 2020-07-28 DIAGNOSIS — I69354 Hemiplegia and hemiparesis following cerebral infarction affecting left non-dominant side: Secondary | ICD-10-CM | POA: Diagnosis not present

## 2020-07-28 DIAGNOSIS — E785 Hyperlipidemia, unspecified: Secondary | ICD-10-CM | POA: Diagnosis not present

## 2020-07-28 DIAGNOSIS — M21372 Foot drop, left foot: Secondary | ICD-10-CM | POA: Diagnosis not present

## 2020-10-13 DIAGNOSIS — L821 Other seborrheic keratosis: Secondary | ICD-10-CM | POA: Diagnosis not present

## 2020-10-13 DIAGNOSIS — Z1283 Encounter for screening for malignant neoplasm of skin: Secondary | ICD-10-CM | POA: Diagnosis not present

## 2020-10-13 DIAGNOSIS — D225 Melanocytic nevi of trunk: Secondary | ICD-10-CM | POA: Diagnosis not present

## 2020-10-13 DIAGNOSIS — D1801 Hemangioma of skin and subcutaneous tissue: Secondary | ICD-10-CM | POA: Diagnosis not present

## 2020-10-13 DIAGNOSIS — L812 Freckles: Secondary | ICD-10-CM | POA: Diagnosis not present

## 2020-10-20 DIAGNOSIS — Z1152 Encounter for screening for COVID-19: Secondary | ICD-10-CM | POA: Diagnosis not present

## 2020-10-20 DIAGNOSIS — J019 Acute sinusitis, unspecified: Secondary | ICD-10-CM | POA: Diagnosis not present

## 2020-12-25 ENCOUNTER — Ambulatory Visit: Payer: Medicare Other | Attending: Internal Medicine

## 2020-12-25 DIAGNOSIS — Z23 Encounter for immunization: Secondary | ICD-10-CM

## 2020-12-25 NOTE — Progress Notes (Signed)
   Covid-19 Vaccination Clinic  Name:  Jason Oconnor    MRN: [id number] DOB: [birth year]  12/25/2020  Jason Oconnor was observed post Covid-19 immunization for 15 minutes without incident. He was provided with Vaccine Information Sheet and instruction to access the V-Safe system.   Jason Oconnor was instructed to call 911 with any severe reactions post vaccine: Marland Kitchen Difficulty breathing  . Swelling of face and throat  . A fast heartbeat  . A bad rash all over body  . Dizziness and weakness   Immunizations Administered    Name Date Dose VIS Date Route   PFIZER Comrnaty([place] TOP) Covid-19 Vaccine 12/25/2020  1:07 PM 0.3 mL 11/02/2020 Intramuscular   Manufacturer: [place]   Lot: [id number]   NDC: [phone]

## 2021-01-18 ENCOUNTER — Other Ambulatory Visit: Payer: Self-pay

## 2021-01-18 ENCOUNTER — Other Ambulatory Visit (HOSPITAL_COMMUNITY): Payer: Self-pay | Admitting: Adult Health

## 2021-01-18 ENCOUNTER — Ambulatory Visit (HOSPITAL_COMMUNITY)
Admission: RE | Admit: 2021-01-18 | Discharge: 2021-01-18 | Disposition: A | Discharge status: Home / Self Care | Payer: Medicare Other | Source: Ambulatory Visit | Attending: Adult Health | Admitting: Adult Health

## 2021-01-18 DIAGNOSIS — R52 Pain, unspecified: Secondary | ICD-10-CM

## 2021-01-18 DIAGNOSIS — M79605 Pain in left leg: Secondary | ICD-10-CM

## 2021-01-18 DIAGNOSIS — M7989 Other specified soft tissue disorders: Secondary | ICD-10-CM | POA: Diagnosis not present

## 2021-01-18 DIAGNOSIS — I10 Essential (primary) hypertension: Secondary | ICD-10-CM | POA: Diagnosis not present

## 2021-01-18 DIAGNOSIS — I8291 Chronic embolism and thrombosis of unspecified vein: Secondary | ICD-10-CM | POA: Diagnosis not present

## 2021-01-18 DIAGNOSIS — M79662 Pain in left lower leg: Secondary | ICD-10-CM | POA: Diagnosis not present

## 2021-01-19 ENCOUNTER — Encounter (HOSPITAL_COMMUNITY): Payer: Self-pay

## 2021-01-26 DIAGNOSIS — R739 Hyperglycemia, unspecified: Secondary | ICD-10-CM | POA: Diagnosis not present

## 2021-01-26 DIAGNOSIS — Z125 Encounter for screening for malignant neoplasm of prostate: Secondary | ICD-10-CM | POA: Diagnosis not present

## 2021-01-26 DIAGNOSIS — E785 Hyperlipidemia, unspecified: Secondary | ICD-10-CM | POA: Diagnosis not present

## 2021-02-02 DIAGNOSIS — Z1212 Encounter for screening for malignant neoplasm of rectum: Secondary | ICD-10-CM | POA: Diagnosis not present

## 2021-02-02 DIAGNOSIS — R3914 Feeling of incomplete bladder emptying: Secondary | ICD-10-CM | POA: Diagnosis not present

## 2021-02-02 DIAGNOSIS — R739 Hyperglycemia, unspecified: Secondary | ICD-10-CM | POA: Diagnosis not present

## 2021-02-02 DIAGNOSIS — I10 Essential (primary) hypertension: Secondary | ICD-10-CM | POA: Diagnosis not present

## 2021-02-02 DIAGNOSIS — R82998 Other abnormal findings in urine: Secondary | ICD-10-CM | POA: Diagnosis not present

## 2021-02-02 DIAGNOSIS — D472 Monoclonal gammopathy: Secondary | ICD-10-CM | POA: Diagnosis not present

## 2021-02-02 DIAGNOSIS — R209 Unspecified disturbances of skin sensation: Secondary | ICD-10-CM | POA: Diagnosis not present

## 2021-02-02 DIAGNOSIS — K219 Gastro-esophageal reflux disease without esophagitis: Secondary | ICD-10-CM | POA: Diagnosis not present

## 2021-02-02 DIAGNOSIS — Z0000 Encounter for general adult medical examination without abnormal findings: Secondary | ICD-10-CM | POA: Diagnosis not present

## 2021-02-02 DIAGNOSIS — I82812 Embolism and thrombosis of superficial veins of left lower extremities: Secondary | ICD-10-CM | POA: Diagnosis not present

## 2021-02-02 DIAGNOSIS — R2689 Other abnormalities of gait and mobility: Secondary | ICD-10-CM | POA: Diagnosis not present

## 2021-02-02 DIAGNOSIS — I69354 Hemiplegia and hemiparesis following cerebral infarction affecting left non-dominant side: Secondary | ICD-10-CM | POA: Diagnosis not present

## 2021-02-02 DIAGNOSIS — E785 Hyperlipidemia, unspecified: Secondary | ICD-10-CM | POA: Diagnosis not present

## 2021-02-02 DIAGNOSIS — I6990 Unspecified sequelae of unspecified cerebrovascular disease: Secondary | ICD-10-CM | POA: Diagnosis not present

## 2021-08-01 DIAGNOSIS — I10 Essential (primary) hypertension: Secondary | ICD-10-CM | POA: Diagnosis not present

## 2021-08-01 DIAGNOSIS — R0981 Nasal congestion: Secondary | ICD-10-CM | POA: Diagnosis not present

## 2021-08-01 DIAGNOSIS — Z1152 Encounter for screening for COVID-19: Secondary | ICD-10-CM | POA: Diagnosis not present

## 2021-08-01 DIAGNOSIS — J0110 Acute frontal sinusitis, unspecified: Secondary | ICD-10-CM | POA: Diagnosis not present

## 2021-08-07 DIAGNOSIS — E785 Hyperlipidemia, unspecified: Secondary | ICD-10-CM | POA: Diagnosis not present

## 2021-08-07 DIAGNOSIS — R3914 Feeling of incomplete bladder emptying: Secondary | ICD-10-CM | POA: Diagnosis not present

## 2021-08-07 DIAGNOSIS — R739 Hyperglycemia, unspecified: Secondary | ICD-10-CM | POA: Diagnosis not present

## 2021-08-07 DIAGNOSIS — I82812 Embolism and thrombosis of superficial veins of left lower extremities: Secondary | ICD-10-CM | POA: Diagnosis not present

## 2021-08-07 DIAGNOSIS — R0981 Nasal congestion: Secondary | ICD-10-CM | POA: Diagnosis not present

## 2021-08-07 DIAGNOSIS — I6990 Unspecified sequelae of unspecified cerebrovascular disease: Secondary | ICD-10-CM | POA: Diagnosis not present

## 2021-08-07 DIAGNOSIS — K5900 Constipation, unspecified: Secondary | ICD-10-CM | POA: Diagnosis not present

## 2021-08-07 DIAGNOSIS — I10 Essential (primary) hypertension: Secondary | ICD-10-CM | POA: Diagnosis not present

## 2021-08-07 DIAGNOSIS — M79673 Pain in unspecified foot: Secondary | ICD-10-CM | POA: Diagnosis not present

## 2021-08-07 DIAGNOSIS — I69354 Hemiplegia and hemiparesis following cerebral infarction affecting left non-dominant side: Secondary | ICD-10-CM | POA: Diagnosis not present

## 2021-09-17 DIAGNOSIS — Z1212 Encounter for screening for malignant neoplasm of rectum: Secondary | ICD-10-CM | POA: Diagnosis not present

## 2021-09-17 DIAGNOSIS — Z1211 Encounter for screening for malignant neoplasm of colon: Secondary | ICD-10-CM | POA: Diagnosis not present

## 2021-10-25 DIAGNOSIS — J0100 Acute maxillary sinusitis, unspecified: Secondary | ICD-10-CM | POA: Diagnosis not present

## 2021-10-25 DIAGNOSIS — R519 Headache, unspecified: Secondary | ICD-10-CM | POA: Diagnosis not present

## 2021-10-25 DIAGNOSIS — R058 Other specified cough: Secondary | ICD-10-CM | POA: Diagnosis not present

## 2021-10-25 DIAGNOSIS — J029 Acute pharyngitis, unspecified: Secondary | ICD-10-CM | POA: Diagnosis not present

## 2021-10-25 DIAGNOSIS — Z1152 Encounter for screening for COVID-19: Secondary | ICD-10-CM | POA: Diagnosis not present

## 2021-10-25 DIAGNOSIS — R0981 Nasal congestion: Secondary | ICD-10-CM | POA: Diagnosis not present

## 2021-11-08 DIAGNOSIS — L821 Other seborrheic keratosis: Secondary | ICD-10-CM | POA: Diagnosis not present

## 2021-11-08 DIAGNOSIS — L308 Other specified dermatitis: Secondary | ICD-10-CM | POA: Diagnosis not present

## 2021-11-08 DIAGNOSIS — L812 Freckles: Secondary | ICD-10-CM | POA: Diagnosis not present

## 2021-11-08 DIAGNOSIS — D1801 Hemangioma of skin and subcutaneous tissue: Secondary | ICD-10-CM | POA: Diagnosis not present

## 2021-11-08 DIAGNOSIS — Z1283 Encounter for screening for malignant neoplasm of skin: Secondary | ICD-10-CM | POA: Diagnosis not present

## 2021-11-08 DIAGNOSIS — D225 Melanocytic nevi of trunk: Secondary | ICD-10-CM | POA: Diagnosis not present

## 2021-11-08 DIAGNOSIS — L218 Other seborrheic dermatitis: Secondary | ICD-10-CM | POA: Diagnosis not present

## 2022-01-17 DIAGNOSIS — L218 Other seborrheic dermatitis: Secondary | ICD-10-CM | POA: Diagnosis not present

## 2022-01-17 DIAGNOSIS — L308 Other specified dermatitis: Secondary | ICD-10-CM | POA: Diagnosis not present

## 2022-01-17 DIAGNOSIS — L810 Postinflammatory hyperpigmentation: Secondary | ICD-10-CM | POA: Diagnosis not present

## 2022-02-11 DIAGNOSIS — R739 Hyperglycemia, unspecified: Secondary | ICD-10-CM | POA: Diagnosis not present

## 2022-02-11 DIAGNOSIS — Z125 Encounter for screening for malignant neoplasm of prostate: Secondary | ICD-10-CM | POA: Diagnosis not present

## 2022-02-11 DIAGNOSIS — E785 Hyperlipidemia, unspecified: Secondary | ICD-10-CM | POA: Diagnosis not present

## 2022-02-11 DIAGNOSIS — I10 Essential (primary) hypertension: Secondary | ICD-10-CM | POA: Diagnosis not present

## 2022-02-18 DIAGNOSIS — Z1389 Encounter for screening for other disorder: Secondary | ICD-10-CM | POA: Diagnosis not present

## 2022-02-18 DIAGNOSIS — Z0000 Encounter for general adult medical examination without abnormal findings: Secondary | ICD-10-CM | POA: Diagnosis not present

## 2022-02-18 DIAGNOSIS — R2689 Other abnormalities of gait and mobility: Secondary | ICD-10-CM | POA: Diagnosis not present

## 2022-02-18 DIAGNOSIS — R0981 Nasal congestion: Secondary | ICD-10-CM | POA: Diagnosis not present

## 2022-02-18 DIAGNOSIS — Z1212 Encounter for screening for malignant neoplasm of rectum: Secondary | ICD-10-CM | POA: Diagnosis not present

## 2022-02-18 DIAGNOSIS — I69354 Hemiplegia and hemiparesis following cerebral infarction affecting left non-dominant side: Secondary | ICD-10-CM | POA: Diagnosis not present

## 2022-02-18 DIAGNOSIS — R3914 Feeling of incomplete bladder emptying: Secondary | ICD-10-CM | POA: Diagnosis not present

## 2022-02-18 DIAGNOSIS — K5900 Constipation, unspecified: Secondary | ICD-10-CM | POA: Diagnosis not present

## 2022-02-18 DIAGNOSIS — I6990 Unspecified sequelae of unspecified cerebrovascular disease: Secondary | ICD-10-CM | POA: Diagnosis not present

## 2022-02-18 DIAGNOSIS — I82812 Embolism and thrombosis of superficial veins of left lower extremities: Secondary | ICD-10-CM | POA: Diagnosis not present

## 2022-02-18 DIAGNOSIS — M79673 Pain in unspecified foot: Secondary | ICD-10-CM | POA: Diagnosis not present

## 2022-02-18 DIAGNOSIS — I10 Essential (primary) hypertension: Secondary | ICD-10-CM | POA: Diagnosis not present

## 2022-02-18 DIAGNOSIS — R82998 Other abnormal findings in urine: Secondary | ICD-10-CM | POA: Diagnosis not present

## 2022-02-18 DIAGNOSIS — Z1331 Encounter for screening for depression: Secondary | ICD-10-CM | POA: Diagnosis not present

## 2022-02-18 DIAGNOSIS — R739 Hyperglycemia, unspecified: Secondary | ICD-10-CM | POA: Diagnosis not present

## 2022-02-18 DIAGNOSIS — E785 Hyperlipidemia, unspecified: Secondary | ICD-10-CM | POA: Diagnosis not present

## 2022-09-09 DIAGNOSIS — M79673 Pain in unspecified foot: Secondary | ICD-10-CM | POA: Diagnosis not present

## 2022-09-09 DIAGNOSIS — R3914 Feeling of incomplete bladder emptying: Secondary | ICD-10-CM | POA: Diagnosis not present

## 2022-09-09 DIAGNOSIS — I82812 Embolism and thrombosis of superficial veins of left lower extremities: Secondary | ICD-10-CM | POA: Diagnosis not present

## 2022-09-09 DIAGNOSIS — R2689 Other abnormalities of gait and mobility: Secondary | ICD-10-CM | POA: Diagnosis not present

## 2022-09-09 DIAGNOSIS — I6990 Unspecified sequelae of unspecified cerebrovascular disease: Secondary | ICD-10-CM | POA: Diagnosis not present

## 2022-09-09 DIAGNOSIS — I10 Essential (primary) hypertension: Secondary | ICD-10-CM | POA: Diagnosis not present

## 2022-09-09 DIAGNOSIS — E785 Hyperlipidemia, unspecified: Secondary | ICD-10-CM | POA: Diagnosis not present

## 2022-09-09 DIAGNOSIS — R209 Unspecified disturbances of skin sensation: Secondary | ICD-10-CM | POA: Diagnosis not present

## 2022-09-09 DIAGNOSIS — I69354 Hemiplegia and hemiparesis following cerebral infarction affecting left non-dominant side: Secondary | ICD-10-CM | POA: Diagnosis not present

## 2022-09-09 DIAGNOSIS — D472 Monoclonal gammopathy: Secondary | ICD-10-CM | POA: Diagnosis not present

## 2022-09-09 DIAGNOSIS — M21372 Foot drop, left foot: Secondary | ICD-10-CM | POA: Diagnosis not present

## 2022-09-09 DIAGNOSIS — R739 Hyperglycemia, unspecified: Secondary | ICD-10-CM | POA: Diagnosis not present

## 2022-10-07 DIAGNOSIS — H25019 Cortical age-related cataract, unspecified eye: Secondary | ICD-10-CM | POA: Diagnosis not present

## 2022-10-07 DIAGNOSIS — H5213 Myopia, bilateral: Secondary | ICD-10-CM | POA: Diagnosis not present

## 2022-10-07 DIAGNOSIS — H02831 Dermatochalasis of right upper eyelid: Secondary | ICD-10-CM | POA: Diagnosis not present

## 2022-10-07 DIAGNOSIS — H02834 Dermatochalasis of left upper eyelid: Secondary | ICD-10-CM | POA: Diagnosis not present

## 2022-10-09 DIAGNOSIS — R051 Acute cough: Secondary | ICD-10-CM | POA: Diagnosis not present

## 2022-10-09 DIAGNOSIS — R0981 Nasal congestion: Secondary | ICD-10-CM | POA: Diagnosis not present

## 2022-10-09 DIAGNOSIS — K219 Gastro-esophageal reflux disease without esophagitis: Secondary | ICD-10-CM | POA: Diagnosis not present

## 2022-10-09 DIAGNOSIS — J029 Acute pharyngitis, unspecified: Secondary | ICD-10-CM | POA: Diagnosis not present

## 2022-10-09 DIAGNOSIS — Z1152 Encounter for screening for COVID-19: Secondary | ICD-10-CM | POA: Diagnosis not present

## 2022-10-09 DIAGNOSIS — R5383 Other fatigue: Secondary | ICD-10-CM | POA: Diagnosis not present

## 2022-10-09 DIAGNOSIS — J069 Acute upper respiratory infection, unspecified: Secondary | ICD-10-CM | POA: Diagnosis not present

## 2022-10-09 DIAGNOSIS — I10 Essential (primary) hypertension: Secondary | ICD-10-CM | POA: Diagnosis not present

## 2022-11-14 DIAGNOSIS — H57819 Brow ptosis, unspecified: Secondary | ICD-10-CM | POA: Diagnosis not present

## 2022-11-14 DIAGNOSIS — H02834 Dermatochalasis of left upper eyelid: Secondary | ICD-10-CM | POA: Diagnosis not present

## 2022-11-14 DIAGNOSIS — H02831 Dermatochalasis of right upper eyelid: Secondary | ICD-10-CM | POA: Diagnosis not present

## 2022-11-20 DIAGNOSIS — Z1283 Encounter for screening for malignant neoplasm of skin: Secondary | ICD-10-CM | POA: Diagnosis not present

## 2022-11-20 DIAGNOSIS — L308 Other specified dermatitis: Secondary | ICD-10-CM | POA: Diagnosis not present

## 2022-11-20 DIAGNOSIS — D225 Melanocytic nevi of trunk: Secondary | ICD-10-CM | POA: Diagnosis not present

## 2022-11-20 DIAGNOSIS — L720 Epidermal cyst: Secondary | ICD-10-CM | POA: Diagnosis not present

## 2022-11-20 DIAGNOSIS — L821 Other seborrheic keratosis: Secondary | ICD-10-CM | POA: Diagnosis not present

## 2022-11-20 DIAGNOSIS — L570 Actinic keratosis: Secondary | ICD-10-CM | POA: Diagnosis not present

## 2022-11-20 DIAGNOSIS — D485 Neoplasm of uncertain behavior of skin: Secondary | ICD-10-CM | POA: Diagnosis not present

## 2022-11-20 DIAGNOSIS — L812 Freckles: Secondary | ICD-10-CM | POA: Diagnosis not present

## 2022-11-20 DIAGNOSIS — L218 Other seborrheic dermatitis: Secondary | ICD-10-CM | POA: Diagnosis not present

## 2023-01-01 DIAGNOSIS — H57819 Brow ptosis, unspecified: Secondary | ICD-10-CM | POA: Diagnosis not present

## 2023-01-01 DIAGNOSIS — H02831 Dermatochalasis of right upper eyelid: Secondary | ICD-10-CM | POA: Diagnosis not present

## 2023-01-01 DIAGNOSIS — H02834 Dermatochalasis of left upper eyelid: Secondary | ICD-10-CM | POA: Diagnosis not present

## 2023-01-15 DIAGNOSIS — H02834 Dermatochalasis of left upper eyelid: Secondary | ICD-10-CM | POA: Diagnosis not present

## 2023-01-15 DIAGNOSIS — H57813 Brow ptosis, bilateral: Secondary | ICD-10-CM | POA: Diagnosis not present

## 2023-01-15 DIAGNOSIS — H02831 Dermatochalasis of right upper eyelid: Secondary | ICD-10-CM | POA: Diagnosis not present

## 2023-02-20 DIAGNOSIS — Z1212 Encounter for screening for malignant neoplasm of rectum: Secondary | ICD-10-CM | POA: Diagnosis not present

## 2023-02-20 DIAGNOSIS — E7849 Other hyperlipidemia: Secondary | ICD-10-CM | POA: Diagnosis not present

## 2023-02-20 DIAGNOSIS — Z8679 Personal history of other diseases of the circulatory system: Secondary | ICD-10-CM | POA: Diagnosis not present

## 2023-02-20 DIAGNOSIS — K219 Gastro-esophageal reflux disease without esophagitis: Secondary | ICD-10-CM | POA: Diagnosis not present

## 2023-02-20 DIAGNOSIS — R739 Hyperglycemia, unspecified: Secondary | ICD-10-CM | POA: Diagnosis not present

## 2023-02-20 DIAGNOSIS — E785 Hyperlipidemia, unspecified: Secondary | ICD-10-CM | POA: Diagnosis not present

## 2023-02-20 DIAGNOSIS — R3914 Feeling of incomplete bladder emptying: Secondary | ICD-10-CM | POA: Diagnosis not present

## 2023-02-20 DIAGNOSIS — I10 Essential (primary) hypertension: Secondary | ICD-10-CM | POA: Diagnosis not present

## 2023-02-27 DIAGNOSIS — Z1331 Encounter for screening for depression: Secondary | ICD-10-CM | POA: Diagnosis not present

## 2023-02-27 DIAGNOSIS — I82812 Embolism and thrombosis of superficial veins of left lower extremities: Secondary | ICD-10-CM | POA: Diagnosis not present

## 2023-02-27 DIAGNOSIS — M791 Myalgia, unspecified site: Secondary | ICD-10-CM | POA: Diagnosis not present

## 2023-02-27 DIAGNOSIS — E785 Hyperlipidemia, unspecified: Secondary | ICD-10-CM | POA: Diagnosis not present

## 2023-02-27 DIAGNOSIS — R209 Unspecified disturbances of skin sensation: Secondary | ICD-10-CM | POA: Diagnosis not present

## 2023-02-27 DIAGNOSIS — R739 Hyperglycemia, unspecified: Secondary | ICD-10-CM | POA: Diagnosis not present

## 2023-02-27 DIAGNOSIS — M21372 Foot drop, left foot: Secondary | ICD-10-CM | POA: Diagnosis not present

## 2023-02-27 DIAGNOSIS — R3914 Feeling of incomplete bladder emptying: Secondary | ICD-10-CM | POA: Diagnosis not present

## 2023-02-27 DIAGNOSIS — Z0000 Encounter for general adult medical examination without abnormal findings: Secondary | ICD-10-CM | POA: Diagnosis not present

## 2023-02-27 DIAGNOSIS — I69354 Hemiplegia and hemiparesis following cerebral infarction affecting left non-dominant side: Secondary | ICD-10-CM | POA: Diagnosis not present

## 2023-02-27 DIAGNOSIS — R0981 Nasal congestion: Secondary | ICD-10-CM | POA: Diagnosis not present

## 2023-02-27 DIAGNOSIS — R82998 Other abnormal findings in urine: Secondary | ICD-10-CM | POA: Diagnosis not present

## 2023-02-27 DIAGNOSIS — R2689 Other abnormalities of gait and mobility: Secondary | ICD-10-CM | POA: Diagnosis not present

## 2023-02-27 DIAGNOSIS — I6990 Unspecified sequelae of unspecified cerebrovascular disease: Secondary | ICD-10-CM | POA: Diagnosis not present

## 2023-02-27 DIAGNOSIS — I10 Essential (primary) hypertension: Secondary | ICD-10-CM | POA: Diagnosis not present

## 2023-02-27 DIAGNOSIS — Z1339 Encounter for screening examination for other mental health and behavioral disorders: Secondary | ICD-10-CM | POA: Diagnosis not present

## 2023-02-27 DIAGNOSIS — M79673 Pain in unspecified foot: Secondary | ICD-10-CM | POA: Diagnosis not present

## 2023-02-27 DIAGNOSIS — G8190 Hemiplegia, unspecified affecting unspecified side: Secondary | ICD-10-CM | POA: Diagnosis not present

## 2023-06-09 DIAGNOSIS — H57819 Brow ptosis, unspecified: Secondary | ICD-10-CM | POA: Diagnosis not present

## 2023-06-09 DIAGNOSIS — H02831 Dermatochalasis of right upper eyelid: Secondary | ICD-10-CM | POA: Diagnosis not present

## 2023-06-09 DIAGNOSIS — H02834 Dermatochalasis of left upper eyelid: Secondary | ICD-10-CM | POA: Diagnosis not present

## 2023-08-02 DIAGNOSIS — M7989 Other specified soft tissue disorders: Secondary | ICD-10-CM | POA: Diagnosis not present

## 2023-08-02 DIAGNOSIS — Z86718 Personal history of other venous thrombosis and embolism: Secondary | ICD-10-CM | POA: Diagnosis not present

## 2023-08-02 DIAGNOSIS — M79605 Pain in left leg: Secondary | ICD-10-CM | POA: Diagnosis not present

## 2023-08-02 DIAGNOSIS — Z5329 Procedure and treatment not carried out because of patient's decision for other reasons: Secondary | ICD-10-CM | POA: Diagnosis not present

## 2023-08-05 DIAGNOSIS — I10 Essential (primary) hypertension: Secondary | ICD-10-CM | POA: Diagnosis not present

## 2023-08-05 DIAGNOSIS — M7989 Other specified soft tissue disorders: Secondary | ICD-10-CM | POA: Diagnosis not present

## 2023-08-05 DIAGNOSIS — R600 Localized edema: Secondary | ICD-10-CM | POA: Diagnosis not present

## 2023-08-12 ENCOUNTER — Other Ambulatory Visit (HOSPITAL_COMMUNITY): Payer: Self-pay | Admitting: Internal Medicine

## 2023-08-12 ENCOUNTER — Ambulatory Visit (HOSPITAL_COMMUNITY)
Admission: RE | Admit: 2023-08-12 | Discharge: 2023-08-12 | Disposition: A | Discharge status: Home / Self Care | Payer: Medicare Other | Source: Ambulatory Visit | Attending: Vascular Surgery | Admitting: Vascular Surgery

## 2023-08-12 DIAGNOSIS — R52 Pain, unspecified: Secondary | ICD-10-CM

## 2023-09-05 DIAGNOSIS — E785 Hyperlipidemia, unspecified: Secondary | ICD-10-CM | POA: Diagnosis not present

## 2023-09-05 DIAGNOSIS — I10 Essential (primary) hypertension: Secondary | ICD-10-CM | POA: Diagnosis not present

## 2023-09-05 DIAGNOSIS — I6990 Unspecified sequelae of unspecified cerebrovascular disease: Secondary | ICD-10-CM | POA: Diagnosis not present

## 2023-09-05 DIAGNOSIS — G8190 Hemiplegia, unspecified affecting unspecified side: Secondary | ICD-10-CM | POA: Diagnosis not present

## 2023-09-05 DIAGNOSIS — R0981 Nasal congestion: Secondary | ICD-10-CM | POA: Diagnosis not present

## 2023-09-05 DIAGNOSIS — M79673 Pain in unspecified foot: Secondary | ICD-10-CM | POA: Diagnosis not present

## 2023-09-05 DIAGNOSIS — I82812 Embolism and thrombosis of superficial veins of left lower extremities: Secondary | ICD-10-CM | POA: Diagnosis not present

## 2023-09-05 DIAGNOSIS — M791 Myalgia, unspecified site: Secondary | ICD-10-CM | POA: Diagnosis not present

## 2023-09-05 DIAGNOSIS — R209 Unspecified disturbances of skin sensation: Secondary | ICD-10-CM | POA: Diagnosis not present

## 2023-09-05 DIAGNOSIS — R3914 Feeling of incomplete bladder emptying: Secondary | ICD-10-CM | POA: Diagnosis not present

## 2023-09-05 DIAGNOSIS — I69354 Hemiplegia and hemiparesis following cerebral infarction affecting left non-dominant side: Secondary | ICD-10-CM | POA: Diagnosis not present

## 2023-10-03 DIAGNOSIS — H5213 Myopia, bilateral: Secondary | ICD-10-CM | POA: Diagnosis not present

## 2023-10-03 DIAGNOSIS — H2513 Age-related nuclear cataract, bilateral: Secondary | ICD-10-CM | POA: Diagnosis not present

## 2023-10-03 DIAGNOSIS — H25013 Cortical age-related cataract, bilateral: Secondary | ICD-10-CM | POA: Diagnosis not present

## 2023-11-21 DIAGNOSIS — D225 Melanocytic nevi of trunk: Secondary | ICD-10-CM | POA: Diagnosis not present

## 2023-11-21 DIAGNOSIS — L821 Other seborrheic keratosis: Secondary | ICD-10-CM | POA: Diagnosis not present

## 2023-11-21 DIAGNOSIS — L812 Freckles: Secondary | ICD-10-CM | POA: Diagnosis not present

## 2023-11-21 DIAGNOSIS — Z1283 Encounter for screening for malignant neoplasm of skin: Secondary | ICD-10-CM | POA: Diagnosis not present

## 2023-11-21 DIAGNOSIS — L218 Other seborrheic dermatitis: Secondary | ICD-10-CM | POA: Diagnosis not present

## 2023-12-22 DIAGNOSIS — H02834 Dermatochalasis of left upper eyelid: Secondary | ICD-10-CM | POA: Diagnosis not present

## 2023-12-22 DIAGNOSIS — H02831 Dermatochalasis of right upper eyelid: Secondary | ICD-10-CM | POA: Diagnosis not present

## 2023-12-22 DIAGNOSIS — H57819 Brow ptosis, unspecified: Secondary | ICD-10-CM | POA: Diagnosis not present

## 2024-02-24 DIAGNOSIS — K5900 Constipation, unspecified: Secondary | ICD-10-CM | POA: Diagnosis not present

## 2024-02-24 DIAGNOSIS — Z1212 Encounter for screening for malignant neoplasm of rectum: Secondary | ICD-10-CM | POA: Diagnosis not present

## 2024-02-24 DIAGNOSIS — E785 Hyperlipidemia, unspecified: Secondary | ICD-10-CM | POA: Diagnosis not present

## 2024-02-24 DIAGNOSIS — I10 Essential (primary) hypertension: Secondary | ICD-10-CM | POA: Diagnosis not present

## 2024-02-24 DIAGNOSIS — Z125 Encounter for screening for malignant neoplasm of prostate: Secondary | ICD-10-CM | POA: Diagnosis not present

## 2024-03-01 DIAGNOSIS — Z1212 Encounter for screening for malignant neoplasm of rectum: Secondary | ICD-10-CM | POA: Diagnosis not present

## 2024-03-01 DIAGNOSIS — E785 Hyperlipidemia, unspecified: Secondary | ICD-10-CM | POA: Diagnosis not present

## 2024-03-02 DIAGNOSIS — R209 Unspecified disturbances of skin sensation: Secondary | ICD-10-CM | POA: Diagnosis not present

## 2024-03-02 DIAGNOSIS — Z1331 Encounter for screening for depression: Secondary | ICD-10-CM | POA: Diagnosis not present

## 2024-03-02 DIAGNOSIS — Z0000 Encounter for general adult medical examination without abnormal findings: Secondary | ICD-10-CM | POA: Diagnosis not present

## 2024-03-02 DIAGNOSIS — E785 Hyperlipidemia, unspecified: Secondary | ICD-10-CM | POA: Diagnosis not present

## 2024-03-02 DIAGNOSIS — Z1339 Encounter for screening examination for other mental health and behavioral disorders: Secondary | ICD-10-CM | POA: Diagnosis not present

## 2024-03-02 DIAGNOSIS — M791 Myalgia, unspecified site: Secondary | ICD-10-CM | POA: Diagnosis not present

## 2024-03-02 DIAGNOSIS — R3914 Feeling of incomplete bladder emptying: Secondary | ICD-10-CM | POA: Diagnosis not present

## 2024-03-02 DIAGNOSIS — I10 Essential (primary) hypertension: Secondary | ICD-10-CM | POA: Diagnosis not present

## 2024-03-02 DIAGNOSIS — R2689 Other abnormalities of gait and mobility: Secondary | ICD-10-CM | POA: Diagnosis not present

## 2024-03-02 DIAGNOSIS — I69354 Hemiplegia and hemiparesis following cerebral infarction affecting left non-dominant side: Secondary | ICD-10-CM | POA: Diagnosis not present

## 2024-03-02 DIAGNOSIS — R972 Elevated prostate specific antigen [PSA]: Secondary | ICD-10-CM | POA: Diagnosis not present

## 2024-03-02 DIAGNOSIS — I82812 Embolism and thrombosis of superficial veins of left lower extremities: Secondary | ICD-10-CM | POA: Diagnosis not present

## 2024-03-02 DIAGNOSIS — R82998 Other abnormal findings in urine: Secondary | ICD-10-CM | POA: Diagnosis not present

## 2024-09-07 DIAGNOSIS — M549 Dorsalgia, unspecified: Secondary | ICD-10-CM | POA: Diagnosis not present

## 2024-09-07 DIAGNOSIS — I6990 Unspecified sequelae of unspecified cerebrovascular disease: Secondary | ICD-10-CM | POA: Diagnosis not present

## 2024-09-07 DIAGNOSIS — R972 Elevated prostate specific antigen [PSA]: Secondary | ICD-10-CM | POA: Diagnosis not present

## 2024-09-07 DIAGNOSIS — M21372 Foot drop, left foot: Secondary | ICD-10-CM | POA: Diagnosis not present

## 2024-09-07 DIAGNOSIS — M791 Myalgia, unspecified site: Secondary | ICD-10-CM | POA: Diagnosis not present

## 2024-09-07 DIAGNOSIS — R3914 Feeling of incomplete bladder emptying: Secondary | ICD-10-CM | POA: Diagnosis not present

## 2024-09-07 DIAGNOSIS — R739 Hyperglycemia, unspecified: Secondary | ICD-10-CM | POA: Diagnosis not present

## 2024-09-07 DIAGNOSIS — I10 Essential (primary) hypertension: Secondary | ICD-10-CM | POA: Diagnosis not present

## 2024-09-07 DIAGNOSIS — R209 Unspecified disturbances of skin sensation: Secondary | ICD-10-CM | POA: Diagnosis not present

## 2024-09-07 DIAGNOSIS — D472 Monoclonal gammopathy: Secondary | ICD-10-CM | POA: Diagnosis not present

## 2024-09-07 DIAGNOSIS — I69354 Hemiplegia and hemiparesis following cerebral infarction affecting left non-dominant side: Secondary | ICD-10-CM | POA: Diagnosis not present

## 2024-09-07 DIAGNOSIS — E785 Hyperlipidemia, unspecified: Secondary | ICD-10-CM | POA: Diagnosis not present

## 2024-10-27 DIAGNOSIS — H25013 Cortical age-related cataract, bilateral: Secondary | ICD-10-CM | POA: Diagnosis not present
